# Patient Record
Sex: Male | Born: 1935 | Race: Black or African American | Hispanic: No | Smoking: Never smoker
Health system: Southern US, Community
[De-identification: ages and names within clinical notes are randomized; demographics above are authoritative.]

## PROBLEM LIST (undated history)

## (undated) DIAGNOSIS — M5136 Other intervertebral disc degeneration, lumbar region: Secondary | ICD-10-CM

## (undated) DIAGNOSIS — N289 Disorder of kidney and ureter, unspecified: Secondary | ICD-10-CM

## (undated) DIAGNOSIS — D494 Neoplasm of unspecified behavior of bladder: Secondary | ICD-10-CM

## (undated) DIAGNOSIS — D696 Thrombocytopenia, unspecified: Secondary | ICD-10-CM

## (undated) DIAGNOSIS — M51369 Other intervertebral disc degeneration, lumbar region without mention of lumbar back pain or lower extremity pain: Secondary | ICD-10-CM

## (undated) DIAGNOSIS — C61 Malignant neoplasm of prostate: Secondary | ICD-10-CM

## (undated) DIAGNOSIS — D61818 Other pancytopenia: Secondary | ICD-10-CM

## (undated) DIAGNOSIS — Z8719 Personal history of other diseases of the digestive system: Secondary | ICD-10-CM

## (undated) DIAGNOSIS — I459 Conduction disorder, unspecified: Secondary | ICD-10-CM

## (undated) DIAGNOSIS — E1142 Type 2 diabetes mellitus with diabetic polyneuropathy: Secondary | ICD-10-CM

## (undated) DIAGNOSIS — I77810 Thoracic aortic ectasia: Secondary | ICD-10-CM

## (undated) DIAGNOSIS — N183 Chronic kidney disease, stage 3 unspecified: Secondary | ICD-10-CM

## (undated) DIAGNOSIS — R2231 Localized swelling, mass and lump, right upper limb: Secondary | ICD-10-CM

## (undated) DIAGNOSIS — I10 Essential (primary) hypertension: Secondary | ICD-10-CM

## (undated) DIAGNOSIS — R55 Syncope and collapse: Secondary | ICD-10-CM

## (undated) DIAGNOSIS — I6523 Occlusion and stenosis of bilateral carotid arteries: Secondary | ICD-10-CM

## (undated) DIAGNOSIS — I5189 Other ill-defined heart diseases: Secondary | ICD-10-CM

## (undated) DIAGNOSIS — H9190 Unspecified hearing loss, unspecified ear: Secondary | ICD-10-CM

## (undated) DIAGNOSIS — I2510 Atherosclerotic heart disease of native coronary artery without angina pectoris: Secondary | ICD-10-CM

## (undated) DIAGNOSIS — I5022 Chronic systolic (congestive) heart failure: Secondary | ICD-10-CM

## (undated) DIAGNOSIS — R351 Nocturia: Secondary | ICD-10-CM

## (undated) HISTORY — PX: CORONARY ARTERY BYPASS GRAFT: SHX141

## (undated) HISTORY — PX: CATARACT EXTRACTION W/PHACO: SHX586

## (undated) HISTORY — PX: CARDIAC CATHETERIZATION: SHX172

## (undated) HISTORY — DX: Syncope and collapse: R55

## (undated) HISTORY — PX: UPPER GASTROINTESTINAL ENDOSCOPY: SHX188

## (undated) HISTORY — PX: LIPOMA EXCISION: SHX5283

## (undated) HISTORY — PX: CATARACT EXTRACTION W/ INTRAOCULAR LENS  IMPLANT, BILATERAL: SHX1307

---

## 1969-02-05 HISTORY — PX: LIPOMA EXCISION: SHX5283

## 1982-06-07 HISTORY — PX: CORONARY ARTERY BYPASS GRAFT: SHX141

## 1994-06-07 DIAGNOSIS — C61 Malignant neoplasm of prostate: Secondary | ICD-10-CM

## 1994-06-07 HISTORY — DX: Malignant neoplasm of prostate: C61

## 1999-01-15 ENCOUNTER — Ambulatory Visit (HOSPITAL_COMMUNITY): Admission: RE | Admit: 1999-01-15 | Discharge: 1999-01-15 | Payer: Self-pay | Admitting: Internal Medicine

## 1999-09-10 ENCOUNTER — Encounter: Payer: Self-pay | Admitting: Neurology

## 1999-09-10 ENCOUNTER — Encounter: Admission: RE | Admit: 1999-09-10 | Discharge: 1999-09-10 | Payer: Self-pay | Admitting: Neurology

## 2001-03-01 ENCOUNTER — Encounter: Admission: RE | Admit: 2001-03-01 | Discharge: 2001-03-30 | Payer: Self-pay | Admitting: Neurology

## 2001-09-21 ENCOUNTER — Encounter (HOSPITAL_BASED_OUTPATIENT_CLINIC_OR_DEPARTMENT_OTHER): Admission: RE | Admit: 2001-09-21 | Discharge: 2001-09-29 | Payer: Self-pay | Admitting: Internal Medicine

## 2001-12-25 ENCOUNTER — Encounter (HOSPITAL_BASED_OUTPATIENT_CLINIC_OR_DEPARTMENT_OTHER): Admission: RE | Admit: 2001-12-25 | Discharge: 2002-01-04 | Payer: Self-pay | Admitting: Internal Medicine

## 2002-04-16 ENCOUNTER — Encounter (HOSPITAL_BASED_OUTPATIENT_CLINIC_OR_DEPARTMENT_OTHER): Admission: RE | Admit: 2002-04-16 | Discharge: 2002-07-16 | Payer: Self-pay | Admitting: Internal Medicine

## 2002-07-19 ENCOUNTER — Encounter (HOSPITAL_BASED_OUTPATIENT_CLINIC_OR_DEPARTMENT_OTHER): Admission: RE | Admit: 2002-07-19 | Discharge: 2002-10-17 | Payer: Self-pay | Admitting: Internal Medicine

## 2002-12-12 ENCOUNTER — Encounter: Admission: RE | Admit: 2002-12-12 | Discharge: 2002-12-12 | Payer: Self-pay | Admitting: Neurology

## 2002-12-12 ENCOUNTER — Encounter: Payer: Self-pay | Admitting: Neurology

## 2002-12-12 ENCOUNTER — Encounter: Payer: Self-pay | Admitting: Diagnostic Radiology

## 2002-12-26 ENCOUNTER — Encounter: Payer: Self-pay | Admitting: Neurology

## 2002-12-26 ENCOUNTER — Encounter: Admission: RE | Admit: 2002-12-26 | Discharge: 2002-12-26 | Payer: Self-pay | Admitting: Neurology

## 2003-01-07 ENCOUNTER — Encounter: Admission: RE | Admit: 2003-01-07 | Discharge: 2003-01-07 | Payer: Self-pay | Admitting: Neurology

## 2003-01-07 ENCOUNTER — Encounter: Payer: Self-pay | Admitting: Neurology

## 2003-01-11 ENCOUNTER — Encounter (HOSPITAL_BASED_OUTPATIENT_CLINIC_OR_DEPARTMENT_OTHER): Admission: RE | Admit: 2003-01-11 | Discharge: 2003-04-11 | Payer: Self-pay | Admitting: Internal Medicine

## 2003-03-08 ENCOUNTER — Encounter: Payer: Self-pay | Admitting: Neurology

## 2003-03-08 ENCOUNTER — Ambulatory Visit (HOSPITAL_COMMUNITY): Admission: RE | Admit: 2003-03-08 | Discharge: 2003-03-08 | Payer: Self-pay | Admitting: Neurology

## 2003-03-23 ENCOUNTER — Encounter: Payer: Self-pay | Admitting: Emergency Medicine

## 2003-03-23 ENCOUNTER — Emergency Department (HOSPITAL_COMMUNITY): Admission: EM | Admit: 2003-03-23 | Discharge: 2003-03-23 | Payer: Self-pay | Admitting: Emergency Medicine

## 2003-05-09 ENCOUNTER — Encounter: Admission: RE | Admit: 2003-05-09 | Discharge: 2003-05-09 | Payer: Self-pay | Admitting: Neurology

## 2003-05-16 ENCOUNTER — Encounter (HOSPITAL_BASED_OUTPATIENT_CLINIC_OR_DEPARTMENT_OTHER): Admission: RE | Admit: 2003-05-16 | Discharge: 2003-05-28 | Payer: Self-pay | Admitting: Internal Medicine

## 2003-08-22 ENCOUNTER — Encounter (HOSPITAL_BASED_OUTPATIENT_CLINIC_OR_DEPARTMENT_OTHER): Admission: RE | Admit: 2003-08-22 | Discharge: 2003-08-29 | Payer: Self-pay | Admitting: Internal Medicine

## 2003-09-09 ENCOUNTER — Ambulatory Visit (HOSPITAL_COMMUNITY): Admission: RE | Admit: 2003-09-09 | Discharge: 2003-09-09 | Payer: Self-pay | Admitting: Gastroenterology

## 2003-11-22 ENCOUNTER — Ambulatory Visit (HOSPITAL_COMMUNITY): Admission: RE | Admit: 2003-11-22 | Discharge: 2003-11-22 | Payer: Self-pay | Admitting: Neurosurgery

## 2003-11-27 ENCOUNTER — Encounter (HOSPITAL_BASED_OUTPATIENT_CLINIC_OR_DEPARTMENT_OTHER): Admission: RE | Admit: 2003-11-27 | Discharge: 2003-12-13 | Payer: Self-pay | Admitting: Internal Medicine

## 2003-12-06 ENCOUNTER — Inpatient Hospital Stay (HOSPITAL_BASED_OUTPATIENT_CLINIC_OR_DEPARTMENT_OTHER): Admission: RE | Admit: 2003-12-06 | Discharge: 2003-12-06 | Payer: Self-pay | Admitting: Cardiology

## 2004-01-06 HISTORY — PX: LUMBAR FUSION: SHX111

## 2004-01-21 ENCOUNTER — Inpatient Hospital Stay (HOSPITAL_COMMUNITY): Admission: RE | Admit: 2004-01-21 | Discharge: 2004-01-27 | Payer: Self-pay | Admitting: Neurosurgery

## 2004-01-27 ENCOUNTER — Inpatient Hospital Stay (HOSPITAL_COMMUNITY)
Admission: RE | Admit: 2004-01-27 | Discharge: 2004-01-31 | Payer: Self-pay | Admitting: Physical Medicine & Rehabilitation

## 2004-03-20 ENCOUNTER — Encounter (HOSPITAL_BASED_OUTPATIENT_CLINIC_OR_DEPARTMENT_OTHER): Admission: RE | Admit: 2004-03-20 | Discharge: 2004-03-31 | Payer: Self-pay | Admitting: Internal Medicine

## 2004-04-17 ENCOUNTER — Emergency Department (HOSPITAL_COMMUNITY): Admission: EM | Admit: 2004-04-17 | Discharge: 2004-04-17 | Payer: Self-pay | Admitting: Emergency Medicine

## 2004-04-21 ENCOUNTER — Ambulatory Visit: Payer: Self-pay | Admitting: Internal Medicine

## 2004-06-30 ENCOUNTER — Encounter (HOSPITAL_BASED_OUTPATIENT_CLINIC_OR_DEPARTMENT_OTHER): Admission: RE | Admit: 2004-06-30 | Discharge: 2004-07-13 | Payer: Self-pay | Admitting: Internal Medicine

## 2004-07-13 ENCOUNTER — Ambulatory Visit: Payer: Self-pay | Admitting: Internal Medicine

## 2004-08-24 ENCOUNTER — Ambulatory Visit: Payer: Self-pay | Admitting: Internal Medicine

## 2004-08-25 ENCOUNTER — Ambulatory Visit: Payer: Self-pay | Admitting: Internal Medicine

## 2005-02-11 ENCOUNTER — Ambulatory Visit: Payer: Self-pay | Admitting: Internal Medicine

## 2005-03-11 ENCOUNTER — Ambulatory Visit: Payer: Self-pay | Admitting: Internal Medicine

## 2005-04-01 ENCOUNTER — Ambulatory Visit: Payer: Self-pay | Admitting: Internal Medicine

## 2005-04-02 ENCOUNTER — Ambulatory Visit: Payer: Self-pay | Admitting: Family Medicine

## 2005-05-06 ENCOUNTER — Ambulatory Visit: Payer: Self-pay | Admitting: Internal Medicine

## 2005-05-19 ENCOUNTER — Encounter: Admission: RE | Admit: 2005-05-19 | Discharge: 2005-06-06 | Payer: Self-pay | Admitting: Internal Medicine

## 2005-06-14 ENCOUNTER — Ambulatory Visit: Payer: Self-pay | Admitting: Internal Medicine

## 2005-06-16 ENCOUNTER — Encounter: Admission: RE | Admit: 2005-06-16 | Discharge: 2005-09-14 | Payer: Self-pay | Admitting: Internal Medicine

## 2005-08-03 ENCOUNTER — Ambulatory Visit: Payer: Self-pay | Admitting: Internal Medicine

## 2005-11-15 ENCOUNTER — Ambulatory Visit: Payer: Self-pay | Admitting: Internal Medicine

## 2005-11-24 ENCOUNTER — Ambulatory Visit: Payer: Self-pay | Admitting: Internal Medicine

## 2006-01-20 ENCOUNTER — Ambulatory Visit: Payer: Self-pay | Admitting: Internal Medicine

## 2006-03-15 ENCOUNTER — Ambulatory Visit: Payer: Self-pay | Admitting: Internal Medicine

## 2006-03-25 ENCOUNTER — Ambulatory Visit: Payer: Self-pay | Admitting: Internal Medicine

## 2006-04-21 ENCOUNTER — Ambulatory Visit: Payer: Self-pay | Admitting: Internal Medicine

## 2006-05-25 ENCOUNTER — Ambulatory Visit: Payer: Self-pay | Admitting: Internal Medicine

## 2006-05-25 LAB — CONVERTED CEMR LAB
BUN: 22 mg/dL (ref 6–23)
Creatinine, Ser: 1.7 mg/dL — ABNORMAL HIGH (ref 0.4–1.5)
Creatinine,U: 42.2 mg/dL
Hgb A1c MFr Bld: 7.8 % — ABNORMAL HIGH (ref 4.6–6.0)
Microalb Creat Ratio: 28.4 mg/g (ref 0.0–30.0)
Microalb, Ur: 1.2 mg/dL (ref 0.0–1.9)

## 2006-06-07 ENCOUNTER — Emergency Department (HOSPITAL_COMMUNITY): Admission: EM | Admit: 2006-06-07 | Discharge: 2006-06-07 | Payer: Self-pay | Admitting: Emergency Medicine

## 2006-06-21 ENCOUNTER — Ambulatory Visit: Payer: Self-pay | Admitting: Internal Medicine

## 2006-07-04 DIAGNOSIS — Q762 Congenital spondylolisthesis: Secondary | ICD-10-CM | POA: Insufficient documentation

## 2006-07-04 DIAGNOSIS — I2510 Atherosclerotic heart disease of native coronary artery without angina pectoris: Secondary | ICD-10-CM | POA: Insufficient documentation

## 2006-07-04 DIAGNOSIS — N179 Acute kidney failure, unspecified: Secondary | ICD-10-CM | POA: Insufficient documentation

## 2006-07-04 DIAGNOSIS — M810 Age-related osteoporosis without current pathological fracture: Secondary | ICD-10-CM | POA: Insufficient documentation

## 2006-07-22 ENCOUNTER — Ambulatory Visit: Payer: Self-pay | Admitting: Internal Medicine

## 2006-08-30 ENCOUNTER — Ambulatory Visit: Payer: Self-pay | Admitting: Internal Medicine

## 2006-08-30 LAB — CONVERTED CEMR LAB
BUN: 22 mg/dL (ref 6–23)
Creatinine, Ser: 1.5 mg/dL (ref 0.4–1.5)
Creatinine,U: 37.7 mg/dL
Hgb A1c MFr Bld: 8.1 % — ABNORMAL HIGH (ref 4.6–6.0)
Microalb Creat Ratio: 18.6 mg/g (ref 0.0–30.0)
Microalb, Ur: 0.7 mg/dL (ref 0.0–1.9)
Potassium: 3.8 meq/L (ref 3.5–5.1)

## 2006-09-06 ENCOUNTER — Ambulatory Visit: Payer: Self-pay | Admitting: Internal Medicine

## 2006-12-07 ENCOUNTER — Ambulatory Visit: Payer: Self-pay | Admitting: Internal Medicine

## 2006-12-12 LAB — CONVERTED CEMR LAB
ALT: 26 units/L (ref 0–53)
AST: 27 units/L (ref 0–37)
BUN: 23 mg/dL (ref 6–23)
Cholesterol: 122 mg/dL (ref 0–200)
Creatinine, Ser: 1.4 mg/dL (ref 0.4–1.5)
Creatinine,U: 44.7 mg/dL
HDL: 42.1 mg/dL (ref >39.0)
Hgb A1c MFr Bld: 8 % — ABNORMAL HIGH (ref 4.6–6.0)
LDL Cholesterol: 65 mg/dL (ref 0–99)
Microalb Creat Ratio: 17.9 mg/g (ref 0.0–30.0)
Microalb, Ur: 0.8 mg/dL (ref 0.0–1.9)
Potassium: 3.2 meq/L — ABNORMAL LOW (ref 3.5–5.1)
Total CHOL/HDL Ratio: 2.9
Triglycerides: 74 mg/dL (ref 0–149)
VLDL: 15 mg/dL (ref 0–40)

## 2006-12-13 ENCOUNTER — Encounter (INDEPENDENT_AMBULATORY_CARE_PROVIDER_SITE_OTHER): Payer: Self-pay | Admitting: *Deleted

## 2006-12-15 ENCOUNTER — Ambulatory Visit: Payer: Self-pay | Admitting: Internal Medicine

## 2006-12-15 DIAGNOSIS — B354 Tinea corporis: Secondary | ICD-10-CM | POA: Insufficient documentation

## 2006-12-15 DIAGNOSIS — R1011 Right upper quadrant pain: Secondary | ICD-10-CM | POA: Insufficient documentation

## 2006-12-18 LAB — CONVERTED CEMR LAB
ALT: 26 units/L (ref 0–53)
AST: 29 units/L (ref 0–37)
Albumin: 3.9 g/dL (ref 3.5–5.2)
Alkaline Phosphatase: 69 units/L (ref 39–117)
Basophils Absolute: 0 10*3/uL (ref 0.0–0.1)
Basophils Relative: 0.3 % (ref 0.0–1.0)
Bilirubin, Direct: 0.1 mg/dL (ref 0.0–0.3)
Eosinophils Absolute: 0.1 10*3/uL (ref 0.0–0.6)
Eosinophils Relative: 2.1 % (ref 0.0–5.0)
HCT: 43.2 % (ref 39.0–52.0)
Hemoglobin: 14.6 g/dL (ref 13.0–17.0)
Lymphocytes Relative: 47.5 % — ABNORMAL HIGH (ref 12.0–46.0)
MCHC: 33.8 g/dL (ref 30.0–36.0)
MCV: 87.4 fL (ref 78.0–100.0)
Monocytes Absolute: 0.5 10*3/uL (ref 0.2–0.7)
Monocytes Relative: 10.9 % (ref 3.0–11.0)
Neutro Abs: 1.8 10*3/uL (ref 1.4–7.7)
Neutrophils Relative %: 39.2 % — ABNORMAL LOW (ref 43.0–77.0)
Platelets: 126 10*3/uL — ABNORMAL LOW (ref 150–400)
RBC: 4.94 M/uL (ref 4.22–5.81)
RDW: 13.8 % (ref 11.5–14.6)
Total Bilirubin: 1 mg/dL (ref 0.3–1.2)
Total Protein: 7.2 g/dL (ref 6.0–8.3)
WBC: 4.6 10*3/uL (ref 4.5–10.5)

## 2006-12-19 ENCOUNTER — Encounter (INDEPENDENT_AMBULATORY_CARE_PROVIDER_SITE_OTHER): Payer: Self-pay | Admitting: *Deleted

## 2006-12-22 ENCOUNTER — Encounter: Admission: RE | Admit: 2006-12-22 | Discharge: 2006-12-22 | Payer: Self-pay | Admitting: Internal Medicine

## 2006-12-23 ENCOUNTER — Encounter (INDEPENDENT_AMBULATORY_CARE_PROVIDER_SITE_OTHER): Payer: Self-pay | Admitting: *Deleted

## 2006-12-27 ENCOUNTER — Telehealth (INDEPENDENT_AMBULATORY_CARE_PROVIDER_SITE_OTHER): Payer: Self-pay | Admitting: *Deleted

## 2006-12-29 ENCOUNTER — Ambulatory Visit: Payer: Self-pay | Admitting: Internal Medicine

## 2007-01-02 ENCOUNTER — Encounter (INDEPENDENT_AMBULATORY_CARE_PROVIDER_SITE_OTHER): Payer: Self-pay | Admitting: *Deleted

## 2007-04-10 ENCOUNTER — Telehealth (INDEPENDENT_AMBULATORY_CARE_PROVIDER_SITE_OTHER): Payer: Self-pay | Admitting: *Deleted

## 2007-07-10 ENCOUNTER — Telehealth (INDEPENDENT_AMBULATORY_CARE_PROVIDER_SITE_OTHER): Payer: Self-pay | Admitting: *Deleted

## 2007-07-17 ENCOUNTER — Telehealth (INDEPENDENT_AMBULATORY_CARE_PROVIDER_SITE_OTHER): Payer: Self-pay | Admitting: *Deleted

## 2007-07-24 ENCOUNTER — Telehealth (INDEPENDENT_AMBULATORY_CARE_PROVIDER_SITE_OTHER): Payer: Self-pay | Admitting: *Deleted

## 2007-07-26 ENCOUNTER — Ambulatory Visit: Payer: Self-pay | Admitting: Internal Medicine

## 2007-07-26 DIAGNOSIS — E119 Type 2 diabetes mellitus without complications: Secondary | ICD-10-CM | POA: Insufficient documentation

## 2007-07-26 DIAGNOSIS — I10 Essential (primary) hypertension: Secondary | ICD-10-CM | POA: Insufficient documentation

## 2007-07-26 DIAGNOSIS — R0989 Other specified symptoms and signs involving the circulatory and respiratory systems: Secondary | ICD-10-CM | POA: Insufficient documentation

## 2007-07-26 DIAGNOSIS — R51 Headache: Secondary | ICD-10-CM | POA: Insufficient documentation

## 2007-07-26 DIAGNOSIS — R519 Headache, unspecified: Secondary | ICD-10-CM | POA: Insufficient documentation

## 2007-07-27 ENCOUNTER — Encounter (INDEPENDENT_AMBULATORY_CARE_PROVIDER_SITE_OTHER): Payer: Self-pay | Admitting: *Deleted

## 2007-07-27 LAB — CONVERTED CEMR LAB: Sed Rate: 16 mm/hr (ref 0–20)

## 2007-08-01 ENCOUNTER — Emergency Department (HOSPITAL_COMMUNITY): Admission: EM | Admit: 2007-08-01 | Discharge: 2007-08-01 | Payer: Self-pay | Admitting: Family Medicine

## 2007-08-03 ENCOUNTER — Ambulatory Visit: Payer: Self-pay

## 2007-08-05 ENCOUNTER — Encounter: Payer: Self-pay | Admitting: Internal Medicine

## 2007-08-28 ENCOUNTER — Telehealth: Payer: Self-pay | Admitting: Internal Medicine

## 2007-08-28 ENCOUNTER — Encounter: Payer: Self-pay | Admitting: Internal Medicine

## 2007-08-28 ENCOUNTER — Emergency Department (HOSPITAL_COMMUNITY): Admission: EM | Admit: 2007-08-28 | Discharge: 2007-08-28 | Payer: Self-pay | Admitting: Emergency Medicine

## 2007-08-31 ENCOUNTER — Ambulatory Visit: Payer: Self-pay | Admitting: Internal Medicine

## 2007-08-31 DIAGNOSIS — R42 Dizziness and giddiness: Secondary | ICD-10-CM | POA: Insufficient documentation

## 2007-09-04 ENCOUNTER — Encounter: Payer: Self-pay | Admitting: Internal Medicine

## 2007-09-06 ENCOUNTER — Encounter: Payer: Self-pay | Admitting: Internal Medicine

## 2007-09-07 ENCOUNTER — Telehealth (INDEPENDENT_AMBULATORY_CARE_PROVIDER_SITE_OTHER): Payer: Self-pay | Admitting: *Deleted

## 2007-09-08 ENCOUNTER — Telehealth (INDEPENDENT_AMBULATORY_CARE_PROVIDER_SITE_OTHER): Payer: Self-pay | Admitting: *Deleted

## 2007-09-14 ENCOUNTER — Encounter (INDEPENDENT_AMBULATORY_CARE_PROVIDER_SITE_OTHER): Payer: Self-pay | Admitting: *Deleted

## 2007-09-25 ENCOUNTER — Telehealth (INDEPENDENT_AMBULATORY_CARE_PROVIDER_SITE_OTHER): Payer: Self-pay | Admitting: *Deleted

## 2007-10-06 ENCOUNTER — Encounter: Payer: Self-pay | Admitting: Internal Medicine

## 2007-10-31 ENCOUNTER — Telehealth (INDEPENDENT_AMBULATORY_CARE_PROVIDER_SITE_OTHER): Payer: Self-pay | Admitting: *Deleted

## 2008-01-08 ENCOUNTER — Telehealth (INDEPENDENT_AMBULATORY_CARE_PROVIDER_SITE_OTHER): Payer: Self-pay | Admitting: *Deleted

## 2008-01-09 ENCOUNTER — Telehealth (INDEPENDENT_AMBULATORY_CARE_PROVIDER_SITE_OTHER): Payer: Self-pay | Admitting: *Deleted

## 2008-01-29 ENCOUNTER — Encounter: Payer: Self-pay | Admitting: Internal Medicine

## 2008-02-06 ENCOUNTER — Encounter: Payer: Self-pay | Admitting: Internal Medicine

## 2008-02-28 ENCOUNTER — Ambulatory Visit: Payer: Self-pay | Admitting: Internal Medicine

## 2008-02-28 LAB — CONVERTED CEMR LAB
ALT: 23 units/L (ref 0–53)
AST: 26 units/L (ref 0–37)
Albumin: 3.7 g/dL (ref 3.5–5.2)
Alkaline Phosphatase: 58 units/L (ref 39–117)
BUN: 25 mg/dL — ABNORMAL HIGH (ref 6–23)
Basophils Absolute: 0 10*3/uL (ref 0.0–0.1)
Basophils Relative: 0.7 % (ref 0.0–3.0)
Bilirubin Urine: NEGATIVE
Bilirubin, Direct: 0.1 mg/dL (ref 0.0–0.3)
Blood in Urine, dipstick: NEGATIVE
CO2: 31 meq/L (ref 19–32)
Calcium: 8.9 mg/dL (ref 8.4–10.5)
Chloride: 106 meq/L (ref 96–112)
Cholesterol: 121 mg/dL (ref 0–200)
Creatinine, Ser: 1.5 mg/dL (ref 0.4–1.5)
Creatinine,U: 91.4 mg/dL
Eosinophils Absolute: 0.1 10*3/uL (ref 0.0–0.7)
Eosinophils Relative: 3.4 % (ref 0.0–5.0)
GFR calc Af Amer: 57 mL/min
GFR calc non Af Amer: 47 mL/min
Glucose, Bld: 120 mg/dL — ABNORMAL HIGH (ref 70–99)
Glucose, Urine, Semiquant: NEGATIVE
HCT: 45 % (ref 39.0–52.0)
HDL: 37.9 mg/dL — ABNORMAL LOW (ref >39.0)
Hemoglobin: 15.4 g/dL (ref 13.0–17.0)
Hgb A1c MFr Bld: 7.4 % — ABNORMAL HIGH (ref 4.6–6.0)
Ketones, urine, test strip: NEGATIVE
LDL Cholesterol: 68 mg/dL (ref 0–99)
Lymphocytes Relative: 45.6 % (ref 12.0–46.0)
MCHC: 34.3 g/dL (ref 30.0–36.0)
MCV: 88.3 fL (ref 78.0–100.0)
Microalb Creat Ratio: 21.9 mg/g (ref 0.0–30.0)
Microalb, Ur: 2 mg/dL — ABNORMAL HIGH (ref 0.0–1.9)
Monocytes Absolute: 0.4 10*3/uL (ref 0.1–1.0)
Monocytes Relative: 11.5 % (ref 3.0–12.0)
Neutro Abs: 1.5 10*3/uL (ref 1.4–7.7)
Neutrophils Relative %: 38.8 % — ABNORMAL LOW (ref 43.0–77.0)
Nitrite: NEGATIVE
PSA: 1.53 ng/mL (ref 0.10–4.00)
Platelets: 128 10*3/uL — ABNORMAL LOW (ref 150–400)
Potassium: 4.2 meq/L (ref 3.5–5.1)
Protein, U semiquant: NEGATIVE
RBC: 5.1 M/uL (ref 4.22–5.81)
RDW: 13.6 % (ref 11.5–14.6)
Sodium: 140 meq/L (ref 135–145)
Specific Gravity, Urine: <1.005
Total Bilirubin: 0.8 mg/dL (ref 0.3–1.2)
Total CHOL/HDL Ratio: 3.2
Total Protein: 7 g/dL (ref 6.0–8.3)
Triglycerides: 74 mg/dL (ref 0–149)
Urobilinogen, UA: 0.2
VLDL: 15 mg/dL (ref 0–40)
WBC Urine, dipstick: NEGATIVE
WBC: 3.8 10*3/uL — ABNORMAL LOW (ref 4.5–10.5)
pH: 6.5

## 2008-03-06 ENCOUNTER — Ambulatory Visit: Payer: Self-pay | Admitting: Internal Medicine

## 2008-03-06 DIAGNOSIS — Z8673 Personal history of transient ischemic attack (TIA), and cerebral infarction without residual deficits: Secondary | ICD-10-CM | POA: Insufficient documentation

## 2008-03-06 DIAGNOSIS — E785 Hyperlipidemia, unspecified: Secondary | ICD-10-CM | POA: Insufficient documentation

## 2008-03-06 DIAGNOSIS — E1165 Type 2 diabetes mellitus with hyperglycemia: Secondary | ICD-10-CM

## 2008-03-06 DIAGNOSIS — E1129 Type 2 diabetes mellitus with other diabetic kidney complication: Secondary | ICD-10-CM | POA: Insufficient documentation

## 2008-03-06 LAB — CONVERTED CEMR LAB
Cholesterol, target level: 200 mg/dL
HDL goal, serum: 40 mg/dL
LDL Goal: 70 mg/dL

## 2008-04-08 ENCOUNTER — Telehealth (INDEPENDENT_AMBULATORY_CARE_PROVIDER_SITE_OTHER): Payer: Self-pay | Admitting: *Deleted

## 2008-04-23 ENCOUNTER — Encounter: Payer: Self-pay | Admitting: Internal Medicine

## 2008-05-09 ENCOUNTER — Emergency Department (HOSPITAL_COMMUNITY): Admission: EM | Admit: 2008-05-09 | Discharge: 2008-05-09 | Payer: Self-pay | Admitting: Family Medicine

## 2008-05-16 ENCOUNTER — Ambulatory Visit: Payer: Self-pay | Admitting: Internal Medicine

## 2008-05-16 DIAGNOSIS — IMO0001 Reserved for inherently not codable concepts without codable children: Secondary | ICD-10-CM | POA: Insufficient documentation

## 2008-05-17 ENCOUNTER — Encounter: Payer: Self-pay | Admitting: Internal Medicine

## 2008-05-27 ENCOUNTER — Telehealth (INDEPENDENT_AMBULATORY_CARE_PROVIDER_SITE_OTHER): Payer: Self-pay | Admitting: *Deleted

## 2008-06-10 ENCOUNTER — Encounter: Payer: Self-pay | Admitting: Internal Medicine

## 2008-06-17 ENCOUNTER — Ambulatory Visit: Payer: Self-pay | Admitting: Internal Medicine

## 2008-07-15 ENCOUNTER — Encounter: Payer: Self-pay | Admitting: Internal Medicine

## 2008-08-07 ENCOUNTER — Ambulatory Visit: Payer: Self-pay

## 2008-08-07 ENCOUNTER — Encounter: Payer: Self-pay | Admitting: Internal Medicine

## 2008-08-16 ENCOUNTER — Ambulatory Visit: Payer: Self-pay | Admitting: Family Medicine

## 2008-08-16 DIAGNOSIS — K5289 Other specified noninfective gastroenteritis and colitis: Secondary | ICD-10-CM | POA: Insufficient documentation

## 2008-09-18 ENCOUNTER — Ambulatory Visit: Payer: Self-pay | Admitting: Internal Medicine

## 2008-09-28 ENCOUNTER — Encounter: Payer: Self-pay | Admitting: Internal Medicine

## 2008-10-01 ENCOUNTER — Ambulatory Visit: Payer: Self-pay | Admitting: Internal Medicine

## 2008-10-01 DIAGNOSIS — M5430 Sciatica, unspecified side: Secondary | ICD-10-CM | POA: Insufficient documentation

## 2008-10-04 ENCOUNTER — Telehealth: Payer: Self-pay | Admitting: Internal Medicine

## 2008-10-07 ENCOUNTER — Encounter: Payer: Self-pay | Admitting: Internal Medicine

## 2008-10-11 ENCOUNTER — Telehealth: Payer: Self-pay | Admitting: Internal Medicine

## 2008-10-14 ENCOUNTER — Ambulatory Visit: Payer: Self-pay | Admitting: Internal Medicine

## 2008-10-15 ENCOUNTER — Encounter: Admission: RE | Admit: 2008-10-15 | Discharge: 2008-11-28 | Payer: Self-pay | Admitting: Internal Medicine

## 2008-10-17 ENCOUNTER — Encounter: Payer: Self-pay | Admitting: Internal Medicine

## 2008-11-05 ENCOUNTER — Encounter: Payer: Self-pay | Admitting: Internal Medicine

## 2008-11-18 ENCOUNTER — Telehealth: Payer: Self-pay | Admitting: Internal Medicine

## 2008-12-02 ENCOUNTER — Encounter: Payer: Self-pay | Admitting: Internal Medicine

## 2008-12-02 ENCOUNTER — Telehealth (INDEPENDENT_AMBULATORY_CARE_PROVIDER_SITE_OTHER): Payer: Self-pay | Admitting: *Deleted

## 2008-12-20 ENCOUNTER — Telehealth (INDEPENDENT_AMBULATORY_CARE_PROVIDER_SITE_OTHER): Payer: Self-pay | Admitting: *Deleted

## 2009-03-11 ENCOUNTER — Ambulatory Visit: Payer: Self-pay | Admitting: Internal Medicine

## 2009-03-11 DIAGNOSIS — R238 Other skin changes: Secondary | ICD-10-CM | POA: Insufficient documentation

## 2009-03-12 ENCOUNTER — Encounter (INDEPENDENT_AMBULATORY_CARE_PROVIDER_SITE_OTHER): Payer: Self-pay | Admitting: *Deleted

## 2009-03-12 ENCOUNTER — Ambulatory Visit: Payer: Self-pay | Admitting: Internal Medicine

## 2009-03-13 LAB — CONVERTED CEMR LAB
BUN: 22 mg/dL (ref 6–23)
Basophils Relative: 1 % (ref 0.0–3.0)
Creatinine, Ser: 1.5 mg/dL (ref 0.4–1.5)
Eosinophils Relative: 1 % (ref 0.0–5.0)
HCT: 45.9 % (ref 39.0–52.0)
Hemoglobin: 15.3 g/dL (ref 13.0–17.0)
Lymphocytes Relative: 68 % — ABNORMAL HIGH (ref 12.0–46.0)
MCHC: 33.3 g/dL (ref 30.0–36.0)
MCV: 89.6 fL (ref 78.0–100.0)
Monocytes Relative: 10 % (ref 3.0–12.0)
Neutrophils Relative %: 20 % — ABNORMAL LOW (ref 43.0–77.0)
Platelets: 119 10*3/uL — ABNORMAL LOW (ref 150.0–400.0)
Potassium: 4.1 meq/L (ref 3.5–5.1)
RBC: 5.12 M/uL (ref 4.22–5.81)
RDW: 13.2 % (ref 11.5–14.6)
WBC: 3.9 10*3/uL — ABNORMAL LOW (ref 4.5–10.5)

## 2009-04-07 ENCOUNTER — Encounter: Payer: Self-pay | Admitting: Internal Medicine

## 2009-05-20 ENCOUNTER — Telehealth (INDEPENDENT_AMBULATORY_CARE_PROVIDER_SITE_OTHER): Payer: Self-pay | Admitting: *Deleted

## 2009-07-07 ENCOUNTER — Telehealth (INDEPENDENT_AMBULATORY_CARE_PROVIDER_SITE_OTHER): Payer: Self-pay | Admitting: *Deleted

## 2009-07-10 ENCOUNTER — Ambulatory Visit: Payer: Self-pay | Admitting: Internal Medicine

## 2009-07-17 ENCOUNTER — Ambulatory Visit: Payer: Self-pay | Admitting: Internal Medicine

## 2009-07-17 ENCOUNTER — Observation Stay (HOSPITAL_COMMUNITY): Admission: EM | Admit: 2009-07-17 | Discharge: 2009-07-18 | Payer: Self-pay | Admitting: Emergency Medicine

## 2009-08-12 ENCOUNTER — Ambulatory Visit: Payer: Self-pay | Admitting: Internal Medicine

## 2009-08-12 DIAGNOSIS — E162 Hypoglycemia, unspecified: Secondary | ICD-10-CM | POA: Insufficient documentation

## 2009-08-20 ENCOUNTER — Encounter: Payer: Self-pay | Admitting: Internal Medicine

## 2009-08-20 ENCOUNTER — Ambulatory Visit: Payer: Self-pay

## 2009-08-20 DIAGNOSIS — I6523 Occlusion and stenosis of bilateral carotid arteries: Secondary | ICD-10-CM

## 2009-08-20 HISTORY — DX: Occlusion and stenosis of bilateral carotid arteries: I65.23

## 2009-08-23 ENCOUNTER — Emergency Department (HOSPITAL_COMMUNITY): Admission: EM | Admit: 2009-08-23 | Discharge: 2009-08-23 | Payer: Self-pay | Admitting: Family Medicine

## 2009-08-25 ENCOUNTER — Encounter: Payer: Self-pay | Admitting: Internal Medicine

## 2010-01-27 ENCOUNTER — Telehealth (INDEPENDENT_AMBULATORY_CARE_PROVIDER_SITE_OTHER): Payer: Self-pay | Admitting: *Deleted

## 2010-02-03 ENCOUNTER — Ambulatory Visit: Payer: Self-pay | Admitting: Internal Medicine

## 2010-02-03 LAB — CONVERTED CEMR LAB
ALT: 16 units/L (ref 0–53)
AST: 22 units/L (ref 0–37)
Albumin: 3.8 g/dL (ref 3.5–5.2)
Alkaline Phosphatase: 70 units/L (ref 39–117)
BUN: 21 mg/dL (ref 6–23)
Bilirubin, Direct: 0.1 mg/dL (ref 0.0–0.3)
Cholesterol: 152 mg/dL (ref 0–200)
Creatinine, Ser: 1.4 mg/dL (ref 0.4–1.5)
HDL: 39.3 mg/dL (ref >39.00)
LDL Cholesterol: 89 mg/dL (ref 0–99)
Potassium: 3.8 meq/L (ref 3.5–5.1)
Total Bilirubin: 0.7 mg/dL (ref 0.3–1.2)
Total CHOL/HDL Ratio: 4
Total Protein: 6.7 g/dL (ref 6.0–8.3)
Triglycerides: 119 mg/dL (ref 0.0–149.0)
VLDL: 23.8 mg/dL (ref 0.0–40.0)

## 2010-02-11 ENCOUNTER — Ambulatory Visit: Payer: Self-pay | Admitting: Internal Medicine

## 2010-02-23 ENCOUNTER — Encounter: Payer: Self-pay | Admitting: Internal Medicine

## 2010-02-26 ENCOUNTER — Encounter: Payer: Self-pay | Admitting: Internal Medicine

## 2010-03-11 ENCOUNTER — Ambulatory Visit: Payer: Self-pay | Admitting: Internal Medicine

## 2010-06-23 ENCOUNTER — Ambulatory Visit
Admission: RE | Admit: 2010-06-23 | Discharge: 2010-06-23 | Payer: Self-pay | Source: Home / Self Care | Attending: Internal Medicine | Admitting: Internal Medicine

## 2010-06-23 DIAGNOSIS — M25559 Pain in unspecified hip: Secondary | ICD-10-CM | POA: Insufficient documentation

## 2010-06-28 ENCOUNTER — Encounter: Payer: Self-pay | Admitting: Neurosurgery

## 2010-07-01 ENCOUNTER — Encounter: Payer: Self-pay | Admitting: Internal Medicine

## 2010-07-07 ENCOUNTER — Ambulatory Visit
Admission: RE | Admit: 2010-07-07 | Discharge: 2010-07-07 | Payer: Self-pay | Source: Home / Self Care | Attending: Internal Medicine | Admitting: Internal Medicine

## 2010-07-07 DIAGNOSIS — M5440 Lumbago with sciatica, unspecified side: Secondary | ICD-10-CM | POA: Insufficient documentation

## 2010-07-07 NOTE — Assessment & Plan Note (Signed)
Summary: B/P folow-up/scm   Vital Signs:  Patient profile:   77 year old male Height:      65.25 inches Weight:      160 pounds Pulse rate:   64 / minute Resp:     14 per minute BP sitting:   130 / 62  (left arm)  Vitals Entered By: Malachi Bonds (July 10, 2009 2:57 PM) CC: BP f/u- pt brought list of meds that insurance will cover , Hypertension Management   CC:  BP f/u- pt brought list of meds that insurance will cover  and Hypertension Management.  Hypertension History:      He denies headache, chest pain, palpitations, dyspnea with exertion, orthopnea, PND, peripheral edema, visual symptoms, neurologic problems, and syncope.  BP not checked @ home.        Positive major cardiovascular risk factors include male age 83 years old or older, diabetes, hyperlipidemia, and hypertension.  Negative major cardiovascular risk factors include negative family history for ischemic heart disease and non-tobacco-user status.        Positive history for target organ damage include ASHD (either angina/prior MI/prior CABG), prior stroke (or TIA), and peripheral vascular disease.     Allergies: 1)  ! Codeine 2)  ! Ace Inhibitors 3)  ! * Metformin  Review of Systems Eyes:  Denies blurring, double vision, and vision loss-both eyes. CV:  Denies leg cramps with exertion. Neuro:  Denies brief paralysis, numbness, tingling, and weakness.  Physical Exam  General:  Appears younger tahn age,in no acute distress; alert,appropriate and cooperative throughout examination Lungs:  Normal respiratory effort, chest expands symmetrically. Lungs are clear to auscultation, no crackles or wheezes. Heart:  normal rate, regular rhythm, no gallop, no rub, no JVD, and grade 1 /6 systolic murmur.   Pulses:  R and L carotid,radial  pulses are full and equal bilaterally. Decreased pedal pulses w/o ischemia. L carotid bruit Extremities:  trace left pedal edema and trace right pedal edema. Good nail health     Neurologic:  alert & oriented X3 and sensation intact to light touch over feet.   Skin:  Intact without suspicious lesions or rashes   Impression & Recommendations:  Problem # 1:  UNSPECIFIED ESSENTIAL HYPERTENSION (ICD-401.9) Excellent control His updated medication list for this problem includes:    Norvasc 10 Mg Tabs (Amlodipine besylate) .Marland Kitchen... Take one tablet daily    Toprol Xl 25 Mg Xr24h-tab (Metoprolol succinate) .Marland Kitchen... 1qd    Metoprolol Succinate 25 Mg Xr24h-tab (Metoprolol succinate) .Marland Kitchen... 1 once daily if bp averages > 130/80    Losartan Potassium-hctz 100-12.5 Mg Tabs (Losartan potassium-hctz) .Marland Kitchen... 1 qd  Complete Medication List: 1)  Bd U/f Iii Short Pen Needle 31g X 8 Mm Misc (Insulin pen needle) 2)  Norvasc 10 Mg Tabs (Amlodipine besylate) .... Take one tablet daily 3)  Gabapentin 300 Mg Caps (Gabapentin) .Marland Kitchen.. 1 by mouth tid 4)  Lantus 100 Unit/ml Soln (Insulin glargine) .Marland Kitchen.. 15 units qam and 10 units supper 5)  Fish Oil 1000 Mg Caps (Omega-3 fatty acids) .... Take 2 capsule by mouth twice a day 6)  Glucagon Emergency 1 Mg Kit (Glucagon (rdna)) .... Administer 1 mg subcutaneously 7)  Fosamax 70 Mg Tabs (Alendronate sodium) .Marland Kitchen.. 1 by mouth qweek 8)  Multivitamin  9)  Bd Insulin Syringe Ultrafine 30g X 1/2" 0.5 Ml Misc (Insulin syringe-needle u-100) .... Pt test 1x a day 10)  Precision Xtra Blood Glucose Strp (Glucose blood) .... Uses four times daily 11)  Humalog Pen 100 Unit/ml Soln (Insulin lispro (human)) .... 4 units q supper 12)  Januvia 100 Mg Tabs (Sitagliptin phosphate) .Marland Kitchen.. 1 by mouth qd 13)  Nitroquick 0.4  .... Use as directed with chest pain as needed 14)  Lumigan 0.03 % Soln (Bimatoprost) .Marland Kitchen.. 1 drop in each eye daily 15)  Toprol Xl 25 Mg Xr24h-tab (Metoprolol succinate) .Marland Kitchen.. 1qd 16)  Aggrenox 25-200 Mg Xr12h-cap (Aspirin-dipyridamole) .Marland Kitchen.. 1 by mouth two times a day 17)  Cyclobenzaprine Hcl 5 Mg Tabs (Cyclobenzaprine hcl) .Marland Kitchen.. 1 at bedtime as needed 18)   Metoprolol Succinate 25 Mg Xr24h-tab (Metoprolol succinate) .Marland Kitchen.. 1 once daily if bp averages > 130/80 19)  Darvocet A500 100-500 Mg Tabs (Propoxyphene n-apap) .Marland Kitchen.. 1-2 q 6 hrs as needed pain 20)  Losartan Potassium-hctz 100-12.5 Mg Tabs (Losartan potassium-hctz) .Marland Kitchen.. 1 qd 21)  Simvastatin 40 Mg Tabs (Simvastatin) .Marland Kitchen.. 1 at bedtime  Hypertension Assessment/Plan:      The patient's hypertensive risk group is category C: Target organ damage and/or diabetes.  Today's blood pressure is 130/62.    Patient Instructions: 1)  Please schedule a follow-up appointment in 3 months. 2)  Check your Blood Pressure regularly. If it is above:140/90 ON AVERAGE  you should make an appointment. 3)  BUN,creat,K+ prior to visit, ICD-9:401.9 4)  Hepatic Panel prior to visit, ICD-9:995.20 5)  Lipid Panel prior to visit, ICD-9:272.4 Prescriptions: LOSARTAN POTASSIUM-HCTZ 100-12.5 MG TABS (LOSARTAN POTASSIUM-HCTZ) 1 qd  #90 x 3   Entered and Authorized by:   Unice Cobble MD   Signed by:   Unice Cobble MD on 07/10/2009   Method used:   Print then Give to Patient   RxID:   [id number]

## 2010-07-07 NOTE — Letter (Signed)
Summary: [facility]  [facility]   Imported By: Edmonia James 09/19/2009 08:47:20  _____________________________________________________________________  External Attachment:    Type:   Image     Comment:   External Document

## 2010-07-07 NOTE — Assessment & Plan Note (Signed)
Summary: DOT PAPERWORK--NEEDS APPT///SPH   Vital Signs:  Patient profile:   77 year old male Height:      65.25 inches Weight:      153 pounds Temp:     97.9 degrees F oral Pulse rate:   72 / minute Resp:     18 per minute BP sitting:   150 / 78  (left arm)  Vitals Entered By: Rolla Flatten CMA (March 11, 2010 1:10 PM) CC: complete DOT paperwork, Type 2 diabetes mellitus follow-up   CC:  complete DOT paperwork and Type 2 diabetes mellitus follow-up.  History of Present Illness:     Hypertension Follow-Up as required by [facility]:   The patient denies lightheadedness, urinary frequency, headaches, and fatigue.  The patient denies the following associated symptoms: chest pain, chest pressure, exercise intolerance, dyspnea, palpitations, syncope, leg edema, and pedal edema.  Compliance with medications (by patient report) has been near 100%.  The patient reports that dietary compliance has been good.  The patient reports exercising daily.  Adjunctive measures currently used by the patient include salt restriction.  BP  not checked @ home . Creatinine was 1.4  02/03/2010.     Hyperlipidemia Follow-Up: The patient denies muscle aches, GI upset, abdominal pain, flushing, itching, constipation, and diarrhea.  Compliance with medications (by patient report) has been near 100%.  Adjunctive measures currently used by the patient include ASA and fish oil supplements.Lipids were @ goal 02/03/2010.  Carotid Dopplers revealed < 39% stenosis in 08/2009. Type 2 Diabetes Mellitus Follow-Up      The patient is  followed by Dr Wilson Singer  for Type 2 diabetes mellitus ; he was last seen 03/05/2010. That report is not available.  The patient reports self managed hypoglycemia.  The patient has been measuring capillary blood glucose before breakfast ( 109-202), before lunch ( 185-269), before dinner( 215-285), and  2 hrs after  dinner (< 315).    Current Medications (verified): 1)  Gabapentin 300 Mg Caps (Gabapentin)  .Marland Kitchen.. 1 By Mouth Tid 2)  Lantus 100 Unit/ml Soln (Insulin Glargine) .Marland Kitchen.. 10 Units Qam and 5 Units Supper 3)  Fish Oil 1000 Mg Caps (Omega-3 Fatty Acids) .... Take 2 Capsule By Mouth Twice A Day 4)  Glucagon Emergency 1 Mg Kit (Glucagon (Rdna)) .... Administer 1 Mg Subcutaneously 5)  Bd Insulin Syringe Ultrafine 30g X 1/2" 0.5 Ml  Misc (Insulin Syringe-Needle U-100) .... Pt Test 1x A Day 6)  Precision Xtra Blood Glucose   Strp (Glucose Blood) .... Uses Four Times Daily 7)  Humalog Pen 100 Unit/ml  Soln (Insulin Lispro (Human)) .... 4 Units Q Supper 8)  Januvia 100 Mg  Tabs (Sitagliptin Phosphate) .Marland Kitchen.. 1 By Mouth Qd 9)  Nitroquick 0.4 .... Use As Directed With Chest Pain As Needed 10)  Lumigan 0.03 % Soln (Bimatoprost) .Marland Kitchen.. 1 Drop in Each Eye Daily 11)  Losartan Potassium-Hctz 100-12.5 Mg Tabs (Losartan Potassium-Hctz) .Marland Kitchen.. 1 Qd 12)  Simvastatin 40 Mg Tabs (Simvastatin) .Marland Kitchen.. 1 At Bedtime 13)  Aspirin 81 Mg Tbec (Aspirin) .... Take One Tablet Daily 14)  Centrum Silver  Chew (Multiple Vitamins-Minerals) .... Take One Tablet Daily 15)  Carvedilol 6.25 Mg Tabs (Carvedilol) .Marland Kitchen.. 1 Two Times A Day (Replaces Amlodipine & Metoprolol)  Allergies (verified): 1)  ! Codeine 2)  ! Ace Inhibitors 3)  ! * Metformin  Past History:  Past Surgical History: coronary artery disease S/P  non Q wave MI CABG 1984 CABG TIMES THREE 1997 PROSTATE CANCER TOE  SURGERY UPPER ENDOSCOPY DIAGNOSE HIATAL HERNIA PROSTATE IRRADIATION 1996 LUMBAR FUSION 01/2004, Dr Vertell Limber  Physical Exam  General:  Appears younger than age,well-nourished,in no acute distress; alert,appropriate and cooperative throughout examination Lungs:  Normal respiratory effort, chest expands symmetrically. Lungs are clear to auscultation, no crackles or wheezes. Heart:  normal rate, regular rhythm, no gallop, no rub, no JVD, no HJR, and grade 1 /6 systolic murmur.  BP rechecked : flow heard @ [id number] systolic but definitive beat heard @ 115( 115/70  bilaterally)  Abdomen:  Bowel sounds positive,abdomen soft and non-tender without masses, organomegaly or hernias noted. Pulses:  R and L carotid,radial,dorsalis pedis and posterior tibial pulses are full and equal bilaterally. R carotid bruit Extremities:  No clubbing, cyanosis, edema. Skin:  Intact without suspicious lesions or rashes Psych:  memory intact for recent and remote, normally interactive, good eye contact, not anxious appearing, and not depressed appearing.     Impression & Recommendations:  Problem # 1:  OTHER AND UNSPECIFIED HYPERLIPIDEMIA (ICD-272.4) Lipids are @ goal His updated medication list for this problem includes:    Simvastatin 40 Mg Tabs (Simvastatin) .Marland Kitchen... 1 at bedtime  Problem # 2:  UNSPECIFIED ESSENTIAL HYPERTENSION (ICD-401.9) Rigidity of arteries is artificially raising BP; control adequate His updated medication list for this problem includes:    Losartan Potassium-hctz 100-12.5 Mg Tabs (Losartan potassium-hctz) .Marland Kitchen... 1 qd    Carvedilol 6.25 Mg Tabs (Carvedilol) .Marland Kitchen... 1 two times a day (replaces amlodipine & metoprolol)  Problem # 3:  AODM (ICD-250.00) Dr Wilson Singer to provide statement as to  impact , if any ,of   Diabetes  on driving privileges His updated medication list for this problem includes:    Lantus 100 Unit/ml Soln (Insulin glargine) .Marland KitchenMarland KitchenMarland KitchenMarland Kitchen 10 units qam and 5 units supper    Glucagon Emergency 1 Mg Kit (Glucagon (rdna)) .Marland Kitchen... Administer 1 mg subcutaneously    Humalog Pen 100 Unit/ml Soln (Insulin lispro (human)) .Marland KitchenMarland KitchenMarland KitchenMarland Kitchen 4 units q supper    Januvia 100 Mg Tabs (Sitagliptin phosphate) .Marland Kitchen... 1 by mouth qd    Losartan Potassium-hctz 100-12.5 Mg Tabs (Losartan potassium-hctz) .Marland Kitchen... 1 qd    Aspirin 81 Mg Tbec (Aspirin) .Marland Kitchen... Take one tablet daily  Complete Medication List: 1)  Gabapentin 300 Mg Caps (Gabapentin) .Marland Kitchen.. 1 by mouth tid 2)  Lantus 100 Unit/ml Soln (Insulin glargine) .Marland Kitchen.. 10 units qam and 5 units supper 3)  Fish Oil 1000 Mg Caps (Omega-3  fatty acids) .... Take 2 capsule by mouth twice a day 4)  Glucagon Emergency 1 Mg Kit (Glucagon (rdna)) .... Administer 1 mg subcutaneously 5)  Bd Insulin Syringe Ultrafine 30g X 1/2" 0.5 Ml Misc (Insulin syringe-needle u-100) .... Pt test 1x a day 6)  Precision Xtra Blood Glucose Strp (Glucose blood) .... Uses four times daily 7)  Humalog Pen 100 Unit/ml Soln (Insulin lispro (human)) .... 4 units q supper 8)  Januvia 100 Mg Tabs (Sitagliptin phosphate) .Marland Kitchen.. 1 by mouth qd 9)  Nitroquick 0.4  .... Use as directed with chest pain as needed 10)  Lumigan 0.03 % Soln (Bimatoprost) .Marland Kitchen.. 1 drop in each eye daily 11)  Losartan Potassium-hctz 100-12.5 Mg Tabs (Losartan potassium-hctz) .Marland Kitchen.. 1 qd 12)  Simvastatin 40 Mg Tabs (Simvastatin) .Marland Kitchen.. 1 at bedtime 13)  Aspirin 81 Mg Tbec (Aspirin) .... Take one tablet daily 14)  Centrum Silver Chew (Multiple vitamins-minerals) .... Take one tablet daily 15)  Carvedilol 6.25 Mg Tabs (Carvedilol) .Marland Kitchen.. 1 two times a day (replaces amlodipine & metoprolol)  Patient Instructions:  1)  Please obtain statement from 03/05/2010 appt from Dr Wilson Singer as to Diabetes impact on driving

## 2010-07-07 NOTE — Progress Notes (Signed)
Summary: REFILL REQUEST  Phone Note Refill Request Call back at Home Phone [phone] Message from:  Pharmacy on January 27, 2010 8:37 AM  Refills Requested: Medication #1:  SIMVASTATIN 40 MG TABS 1 at bedtime   Dosage confirmed as above?Dosage Confirmed   Supply Requested: 3 months  Medication #2:  METOPROLOL SUCCINATE 25 MG XR24H-TAB 1 once daily if BP averages > 130/80   Dosage confirmed as above?Dosage Confirmed   Supply Requested: 3 months PT AWARE READY FOR PICKUP WITHIN 24 HRS. [phone]   Method Requested: Pick up at Office Next Appointment Scheduled: NONE Initial call taken by: Osborn Coho,  January 27, 2010 8:38 AM  Follow-up for Phone Call        Patient due for Lipid/Hep 272.4/995.20  left message on VM informing patient rx's ready for pick-up Follow-up by: Georgette Dover CMA,  January 28, 2010 3:55 PM    New/Updated Medications: SIMVASTATIN 40 MG TABS (SIMVASTATIN) 1 at bedtime** Labs Due** Prescriptions: METOPROLOL SUCCINATE 25 MG XR24H-TAB (METOPROLOL SUCCINATE) 1 once daily if BP averages > 130/80  #90 x 1   Entered by:   [place] by:   Unice Cobble MD   Signed by:   Georgette Dover CMA on 01/28/2010   Method used:   Print then Give to Patient   RxID:   [id number] SIMVASTATIN 40 MG TABS (SIMVASTATIN) 1 at bedtime** Labs Due**  #90 x 0   Entered by:   Georgette Dover CMA   Authorized by:   Unice Cobble MD   Signed by:   Georgette Dover CMA on 01/28/2010   Method used:   Print then Give to Patient   RxID:   [id number]

## 2010-07-07 NOTE — Assessment & Plan Note (Signed)
Summary: post hospital f/u//lch   Vital Signs:  Patient profile:   77 year old male Weight:      158 pounds Pulse rate:   68 / minute Resp:     17 per minute BP sitting:   138 / 70  (left arm)  Vitals Entered By: Malachi Bonds (August 12, 2009 2:12 PM) CC: hosp f/u - also needs refill on fosamax,gabapentin, and simvastatin   CC:  hosp f/u - also needs refill on fosamax, gabapentin, and and simvastatin.  History of Present Illness: D/C Summary reviewed;hypoglycemia to 43 in context of am Lantus & delayed lunch. He has been  asymptomatic since D/C. FBS average in 90s; he takes 15 Lantus in am & 10 u Lantus & 4 u Humalog 2 night as per Dr Wilson Singer in 04/2009. FBS were in 100-110 then. He has SSI @ lunch.  Allergies: 1)  ! Codeine 2)  ! Ace Inhibitors 3)  ! * Metformin  Review of Systems Eyes:  Denies blurring, double vision, and vision loss-both eyes. CV:  Denies chest pain or discomfort, lightheadness, near fainting, and palpitations. Neuro:  Complains of poor balance; denies brief paralysis, disturbances in coordination, falling down, numbness, tingling, and weakness; Slight balance issues @ times.  Physical Exam  General:  Appears younger than age,well-nourished,in no acute distress; alert,appropriate and cooperative throughout examination Heart:  normal rate, regular rhythm, no gallop, no rub, no JVD, and grade [id number] /6 systolic murmur. Carotid radiation vs bruit , L >R  Extremities:  Amputations LUE Neurologic:  alert & oriented X3, strength normal in all extremities, gait normal, finger-to-nose normal, and Romberg  slightly unsteady. No pronator drift Skin:  Intact without suspicious lesions or rashes Psych:  memory intact for recent and remote, normally interactive, and good eye contact.     Impression & Recommendations:  Problem # 1:  HYPOGLYCEMIA, UNSPECIFIED (ICD-251.2)  related to delayed meal  Orders: Endocrinology Referral (Endocrine)  Problem # 2:  OTHER AND  UNSPECIFIED HYPERLIPIDEMIA (ICD-272.4)  His updated medication list for this problem includes:    Simvastatin 40 Mg Tabs (Simvastatin) .Marland Kitchen... 1 at bedtime  Complete Medication List: 1)  Norvasc 10 Mg Tabs (Amlodipine besylate) .... Take one tablet daily 2)  Gabapentin 300 Mg Caps (Gabapentin) .Marland Kitchen.. 1 by mouth tid 3)  Lantus 100 Unit/ml Soln (Insulin glargine) .Marland Kitchen.. 15 units qam and 10 units supper 4)  Fish Oil 1000 Mg Caps (Omega-3 fatty acids) .... Take 2 capsule by mouth twice a day 5)  Glucagon Emergency 1 Mg Kit (Glucagon (rdna)) .... Administer 1 mg subcutaneously 6)  Fosamax 70 Mg Tabs (Alendronate sodium) .Marland Kitchen.. 1 by mouth qweek 7)  Bd Insulin Syringe Ultrafine 30g X 1/2" 0.5 Ml Misc (Insulin syringe-needle u-100) .... Pt test 1x a day 8)  Precision Xtra Blood Glucose Strp (Glucose blood) .... Uses four times daily 9)  Humalog Pen 100 Unit/ml Soln (Insulin lispro (human)) .... 4 units q supper 10)  Januvia 100 Mg Tabs (Sitagliptin phosphate) .Marland Kitchen.. 1 by mouth qd 11)  Nitroquick 0.4  .... Use as directed with chest pain as needed 12)  Lumigan 0.03 % Soln (Bimatoprost) .Marland Kitchen.. 1 drop in each eye daily 13)  Cyclobenzaprine Hcl 5 Mg Tabs (Cyclobenzaprine hcl) .Marland Kitchen.. 1 at bedtime as needed 14)  Metoprolol Succinate 25 Mg Xr24h-tab (Metoprolol succinate) .Marland Kitchen.. 1 once daily if bp averages > 130/80 15)  Losartan Potassium-hctz 100-12.5 Mg Tabs (Losartan potassium-hctz) .Marland Kitchen.. 1 qd 16)  Simvastatin 40 Mg Tabs (Simvastatin) .Marland KitchenMarland KitchenMarland Kitchen  1 at bedtime 17)  Aspirin 81 Mg Tbec (Aspirin) .... Take one tablet daily 18)  Centrum Silver Chew (Multiple vitamins-minerals) .... Take one tablet daily  Patient Instructions: 1)  Check your blood sugars regularly. If your readings are usually above :160  or below 100 you should  contact physician.Take the D/C Summary to Dr Wilson Singer. Fosamax should be continued X 5 years MAX. Prescriptions: SIMVASTATIN 40 MG TABS (SIMVASTATIN) 1 at bedtime  #90 x 1   Entered and Authorized by:    Unice Cobble MD   Signed by:   Unice Cobble MD on 08/12/2009   Method used:   Print then Give to Patient   RxID:   [id number] GABAPENTIN 300 MG CAPS (GABAPENTIN) 1 by mouth tid  #270 x 3   Entered and Authorized by:   Unice Cobble MD   Signed by:   Unice Cobble MD on 08/12/2009   Method used:   Print then Give to Patient   RxID:   [id number]

## 2010-07-07 NOTE — Assessment & Plan Note (Signed)
Summary: review lab - cbs   Vital Signs:  Patient profile:   77 year old male Weight:      153.6 pounds Temp:     98.2 degrees F oral Pulse rate:   60 / minute Resp:     15 per minute BP sitting:   150 / 80  (left arm) Cuff size:   large  Vitals Entered By: Georgette Dover CMA (February 11, 2010 3:05 PM) CC: Follow-up visit: discuss labs (copy given), New RX for Nitroquick, and discuss meds (amlodipine,metoprolol, & simvastatin)   CC:  Follow-up visit: discuss labs (copy given), New RX for Nitroquick, and discuss meds (amlodipine, metoprolol, and & simvastatin).  History of Present Illness: Hyperlipidemia Follow-Up      This is an 77 year old man who presents for Hyperlipidemia follow-up.  The patient denies muscle aches, GI upset, abdominal pain, flushing, itching, constipation, diarrhea, and fatigue.  The patient denies the following symptoms: chest pain/pressure, exercise intolerance, dypsnea, palpitations, syncope, and pedal edema.  Compliance with medications (by patient report) has been near 100%.  Dietary compliance has been good.  The patient reports exercising daily as walking 30 min.  Adjunctive measures currently used by the patient include ASA and fish oil supplements.  Lipids reviewed ; all @ goal on Simvastatin 40 mg at bedtime.  Hypertension Follow-Up      The patient also presents for Hypertension follow-up.  The patient denies lightheadedness, urinary frequency (but having nocturia X2 ), and headaches.  Compliance with medications (by patient report) has been near 100%.  Adjunctive measures currently used by the patient include salt restriction.  BP not monitored. He is on Amlodipine 10 mg ; there is potential interactionwith this CCB & the statin.BP today elevated.  Current Medications (verified): 1)  Norvasc 10 Mg Tabs (Amlodipine Besylate) .... Take One Tablet Daily 2)  Gabapentin 300 Mg Caps (Gabapentin) .Marland Kitchen.. 1 By Mouth Tid 3)  Lantus 100 Unit/ml Soln (Insulin Glargine)  .Marland Kitchen.. 10 Units Qam and 5 Units Supper 4)  Fish Oil 1000 Mg Caps (Omega-3 Fatty Acids) .... Take 2 Capsule By Mouth Twice A Day 5)  Glucagon Emergency 1 Mg Kit (Glucagon (Rdna)) .... Administer 1 Mg Subcutaneously 6)  Bd Insulin Syringe Ultrafine 30g X 1/2" 0.5 Ml  Misc (Insulin Syringe-Needle U-100) .... Pt Test 1x A Day 7)  Precision Xtra Blood Glucose   Strp (Glucose Blood) .... Uses Four Times Daily 8)  Humalog Pen 100 Unit/ml  Soln (Insulin Lispro (Human)) .... 4 Units Q Supper 9)  Januvia 100 Mg  Tabs (Sitagliptin Phosphate) .Marland Kitchen.. 1 By Mouth Qd 10)  Nitroquick 0.4 .... Use As Directed With Chest Pain As Needed 11)  Lumigan 0.03 % Soln (Bimatoprost) .Marland Kitchen.. 1 Drop in Each Eye Daily 12)  Metoprolol Succinate 25 Mg Xr24h-Tab (Metoprolol Succinate) .Marland Kitchen.. 1 Once Daily If Bp Averages > 130/80 13)  Losartan Potassium-Hctz 100-12.5 Mg Tabs (Losartan Potassium-Hctz) .Marland Kitchen.. 1 Qd 14)  Simvastatin 40 Mg Tabs (Simvastatin) .Marland Kitchen.. 1 At Bedtime** Labs Due** 15)  Aspirin 81 Mg Tbec (Aspirin) .... Take One Tablet Daily 16)  Centrum Silver  Chew (Multiple Vitamins-Minerals) .... Take One Tablet Daily  Allergies: 1)  ! Codeine 2)  ! Ace Inhibitors 3)  ! * Metformin  Physical Exam  General:  Thin ,well-nourished,in no acute distress; appears younger than age;alert,appropriate and cooperative throughout examination Lungs:  Normal respiratory effort, chest expands symmetrically. Lungs are clear to auscultation, no crackles or wheezes. Heart:  normal rate, regular  rhythm, and grade 0000000 /6 systolic murmur.  Increased S2 Abdomen:  Bowel sounds positive,abdomen soft and non-tender without masses, organomegaly or hernias noted. Pulses:  R and L carotid,radial,dorsalis pedis and posterior tibial pulses are full and equal bilaterally. L carotid bruit Extremities:  No clubbing, cyanosis, edema. Good nail health; no ischemic change Neurologic:  alert & oriented X3.   Skin:  Intact without suspicious lesions or  rashes Psych:  memory intact for recent and remote, normally interactive, and good eye contact.     Impression & Recommendations:  Problem # 1:  OTHER AND UNSPECIFIED HYPERLIPIDEMIA (ICD-272.4) Lipids @ goal  His updated medication list for this problem includes:    Simvastatin 40 Mg Tabs (Simvastatin)   Problem # 2:  UNSPECIFIED ESSENTIAL HYPERTENSION (ICD-401.9) Potential interaction between Amlodipine & Simvastatin The following medications were removed from the medication list:    Norvasc 10 Mg Tabs (Amlodipine besylate) .Marland Kitchen... Take one tablet daily    Metoprolol Succinate 25 Mg Xr24h-tab (Metoprolol succinate) .Marland Kitchen... 1 once daily if bp averages > 130/80 His updated medication list for this problem includes:    Losartan Potassium-hctz 100-12.5 Mg Tabs (Losartan potassium-hctz) .Marland Kitchen... 1 qd    Carvedilol 6.25 Mg Tabs (Carvedilol) .Marland Kitchen... 1 two times a day (replaces amlodipine & metoprolol)  Complete Medication List: 1)  Gabapentin 300 Mg Caps (Gabapentin) .Marland Kitchen.. 1 by mouth tid 2)  Lantus 100 Unit/ml Soln (Insulin glargine) .Marland Kitchen.. 10 units qam and 5 units supper 3)  Fish Oil 1000 Mg Caps (Omega-3 fatty acids) .... Take 2 capsule by mouth twice a day 4)  Glucagon Emergency 1 Mg Kit (Glucagon (rdna)) .... Administer 1 mg subcutaneously 5)  Bd Insulin Syringe Ultrafine 30g X 1/2" 0.5 Ml Misc (Insulin syringe-needle u-100) .... Pt test 1x a day 6)  Precision Xtra Blood Glucose Strp (Glucose blood) .... Uses four times daily 7)  Humalog Pen 100 Unit/ml Soln (Insulin lispro (human)) .... 4 units q supper 8)  Januvia 100 Mg Tabs (Sitagliptin phosphate) .Marland Kitchen.. 1 by mouth qd 9)  Nitroquick 0.4  .... Use as directed with chest pain as needed 10)  Lumigan 0.03 % Soln (Bimatoprost) .Marland Kitchen.. 1 drop in each eye daily 11)  Losartan Potassium-hctz 100-12.5 Mg Tabs (Losartan potassium-hctz) .Marland Kitchen.. 1 qd 12)  Simvastatin 40 Mg Tabs (Simvastatin) .Marland Kitchen.. 1 at bedtime 13)  Aspirin 81 Mg Tbec (Aspirin) .... Take one tablet  daily 14)  Centrum Silver Chew (Multiple vitamins-minerals) .... Take one tablet daily 15)  Carvedilol 6.25 Mg Tabs (Carvedilol) .Marland Kitchen.. 1 two times a day (replaces amlodipine & metoprolol)  Patient Instructions: 1)  Check your Blood Pressure regularly. If it is above: 135/85 ON AVERAGE on Carvedilol 6.25 mg twice a day over 10-14 days  you should make an appointment. Prescriptions: SIMVASTATIN 40 MG TABS (SIMVASTATIN) 1 at bedtime  #90 x 2   Entered and Authorized by:   Unice Cobble MD   Signed by:   Unice Cobble MD on 02/11/2010   Method used:   Print then Give to Patient   RxID:   [id number] CARVEDILOL 6.25 MG TABS (CARVEDILOL) 1 two times a day (replaces Amlodipine & Metoprolol)  #180 x 1   Entered and Authorized by:   Unice Cobble MD   Signed by:   Unice Cobble MD on 02/11/2010   Method used:   Print then Give to Patient   RxID:   [id number]

## 2010-07-07 NOTE — Letter (Signed)
Summary: [facility]   Imported By: Edmonia James 03/09/2010 09:07:19  _____________________________________________________________________  External Attachment:    Type:   Image     Comment:   External Document

## 2010-07-07 NOTE — Miscellaneous (Signed)
Summary: Orders Update  Clinical Lists Changes  Orders: Added new Test order of Carotid Duplex (Carotid Duplex) - Signed 

## 2010-07-07 NOTE — Progress Notes (Signed)
Summary: Medication Change Request  Phone Note Call from Patient Call back at Home Phone [phone]   Caller: Patient Summary of Call: Message left on VM: Patient is currently on Vytorin and would like a generic. Patient recieved a letter stating there insurance will cover: Pravastatin, Simvastatin or Lovastatin.  Please give a 90 day supply and call patient when ready for pick-up  Dr.Hopper please advise on med change, last lipid check was 04/07/2009 with [facility]  July 07, 2009 1:10 PM   Follow-up for Phone Call        Simvastatin 40 mg at bedtime  #90, RX 1 Follow-up by: Unice Cobble MD,  July 07, 2009 6:06 PM  Additional Follow-up for Phone Call Additional follow up Details #1::        Spoke with patient, patient would like a 30 day supply sent to a local pharmacy cause he is completely out. 90 day supply was placed @ the front for pick-up Additional Follow-up by: Georgette Dover,  July 08, 2009 10:27 AM    New/Updated Medications: SIMVASTATIN 40 MG TABS (SIMVASTATIN) 1 at bedtime SIMVASTATIN 40 MG TABS (SIMVASTATIN) 1 by mouth at bedtime Prescriptions: SIMVASTATIN 40 MG TABS (SIMVASTATIN) 1 at bedtime  #30 x 0   Entered by:   Georgette Dover   Authorized by:   Unice Cobble MD   Signed by:   Georgette Dover on 07/08/2009   Method used:   Electronically to        [facility] [phone]* (retail)       [place], [place]  [id number]       Ph: [phone] or [phone]       Fax: [phone]   RxID:   [id number] SIMVASTATIN 40 MG TABS (SIMVASTATIN) 1 by mouth at bedtime  #90 x 1   Entered by:   Georgette Dover   Authorized by:   Unice Cobble MD   Signed by:   Georgette Dover on 07/08/2009   Method used:   Print then Give to Patient   RxID:   [id number] SIMVASTATIN 40 MG TABS (SIMVASTATIN) 1 at bedtime  #90 x 1   Entered and Authorized by:   Unice Cobble MD   Signed by:    Georgette Dover on 07/08/2009   Method used:   Print then Give to Patient   RxID:   [id number]

## 2010-07-09 NOTE — Assessment & Plan Note (Signed)
Summary: painful legs//lch   Vital Signs:  Patient profile:   77 year old male Weight:      152.4 pounds BMI:     25.26 O2 Sat:      99 % on Room air Temp:     97.7 degrees F oral Pulse rate:   57 / minute Resp:     16 per minute BP sitting:   160 / 94  (left arm) Cuff size:   large  Vitals Entered By: Georgette Dover CMA (June 23, 2010 9:29 AM)  O2 Flow:  Room air CC: Pain in right leg ( hip-down) x 1-2 weeks, no energy, and SOB, Lower Extremity Joint pain, Back pain   CC:  Pain in right leg ( hip-down) x 1-2 weeks, no energy, and SOB, Lower Extremity Joint pain, and Back pain.  History of Present Illness:      This is an 77 year old man who presents with Lower Extremity  pain X 2 weeks .  The patient denies swelling, redness, giving away, locking, and popping.  The pain is located in the hip & posterior thigh.  The pain began with no injury.  The pain is described as sharp- stabbing, constant, and occuring at rest.  The patient denies the following symptoms: fever, rash, and dysuria.   The patient denies loss of sensation, fecal incontinence, urinary incontinence, and urinary retention.  The pain radiates to the right leg  above  the knee.  The pain is made better by activity and  initially by acetaminophen.         See BP; BP  only checked @ church by Nurse  &  not checked since 11/2009.The patient denies lightheadedness, urinary frequency, claudication and headaches.  Associated symptoms include dyspnea.  The patient denies the following associated symptoms: chest pain, chest pressure, and palpitations.  Compliance with medications (by patient report) has been near 100%.  Adjunctive measures currently used by the patient include salt restriction.    Allergies: 1)  ! Codeine 2)  ! Ace Inhibitors 3)  ! * Metformin  Physical Exam  General:  Thin,in no acute distress; alert,appropriate and cooperative throughout examination Lungs:  Normal respiratory effort, chest expands  symmetrically. Lungs are clear to auscultation, no crackles or wheezes. Heart:  normal rate, regular rhythm, no gallop, no rub, no JVD, no HJR, and grade 1 /6 systolic murmur.   Abdomen:  Bowel sounds positive,abdomen soft and non-tender without masses, organomegaly or hernias noted. No AAA or bruits Msk:  He lay down & sat up w/o help; neg SLR to 90 degrees Pulses:  R and L carotid,radial  pulses are full and equal bilaterally. L carotid bruit. Pedal pulses  palpated  Extremities:  No  edema Neurologic:  alert & oriented X3, strength normal in all extremities, and DTRs symmetrical  but 0+ @ knees . Limping on R   Impression & Recommendations:  Problem # 1:  HIP PAIN, RIGHT (ICD-719.45)  His updated medication list for this problem includes:    Aspirin 81 Mg Tbec (Aspirin) .Marland Kitchen... Take one tablet daily    Meloxicam 7.5 Mg Tabs (Meloxicam) .Marland Kitchen... 1 two times a day as needed for hip pain  Problem # 2:  SCIATICA, RIGHT (ICD-724.3)  His updated medication list for this problem includes:    Aspirin 81 Mg Tbec (Aspirin) .Marland Kitchen... Take one tablet daily    Meloxicam 7.5 Mg Tabs (Meloxicam) .Marland Kitchen... 1 two times a day as needed for hip pain  Problem #  3:  HYPERTENSION, MODERATE (ICD-401.9) uncontrolled His updated medication list for this problem includes:    Losartan Potassium-hctz 100-12.5 Mg Tabs (Losartan potassium-hctz) .Marland Kitchen... 1 qd    Carvedilol 6.25 Mg Tabs (Carvedilol) .Marland Kitchen... 1& 1/2  two times a day (replaces amlodipine & metoprolol)  Complete Medication List: 1)  Gabapentin 300 Mg Caps (Gabapentin) .Marland Kitchen.. 1 by mouth tid 2)  Lantus 100 Unit/ml Soln (Insulin glargine) .Marland Kitchen.. 10 units qam and 5 units supper 3)  Fish Oil 1000 Mg Caps (Omega-3 fatty acids) .... Take 2 capsule by mouth twice a day 4)  Glucagon Emergency 1 Mg Kit (Glucagon (rdna)) .... Administer 1 mg subcutaneously 5)  Bd Insulin Syringe Ultrafine 30g X 1/2" 0.5 Ml Misc (Insulin syringe-needle u-100) .... Pt test 1x a day 6)  Precision  Xtra Blood Glucose Strp (Glucose blood) .... Uses four times daily 7)  Humalog Pen 100 Unit/ml Soln (Insulin lispro (human)) .... 4 units q supper 8)  Januvia 100 Mg Tabs (Sitagliptin phosphate) .Marland Kitchen.. 1 by mouth qd 9)  Nitroquick 0.4  .... Use as directed with chest pain as needed 10)  Lumigan 0.03 % Soln (Bimatoprost) .Marland Kitchen.. 1 drop in each eye daily 11)  Losartan Potassium-hctz 100-12.5 Mg Tabs (Losartan potassium-hctz) .Marland Kitchen.. 1 qd 12)  Simvastatin 40 Mg Tabs (Simvastatin) .Marland Kitchen.. 1 at bedtime 13)  Aspirin 81 Mg Tbec (Aspirin) .... Take one tablet daily 14)  Centrum Silver Chew (Multiple vitamins-minerals) .... Take one tablet daily 15)  Carvedilol 6.25 Mg Tabs (Carvedilol) .Marland Kitchen.. 1& 1/2  two times a day (replaces amlodipine & metoprolol) 16)  Meloxicam 7.5 Mg Tabs (Meloxicam) .Marland Kitchen.. 1 two times a day as needed for hip pain  Patient Instructions: 1)  Please schedule a follow-up appointment in 2 weeks. 2)  Check your Blood Pressure regularly. Your goal = AVERAGE < 140/90. Prescriptions: MELOXICAM 7.5 MG TABS (MELOXICAM) 1 two times a day as needed for hip pain  #20 x 0   Entered and Authorized by:   Unice Cobble MD   Signed by:   Unice Cobble MD on 06/23/2010   Method used:   Electronically to        [facility] [phone]* (retail)       [place], [place]  [id number]       Ph: [phone] or [phone]       Fax: [phone]   RxID:   [id number]    Orders Added: 1)  Est. Patient Level IV [id number]

## 2010-07-15 NOTE — Assessment & Plan Note (Signed)
Summary: 2 WKS OV//PH   Vital Signs:  Patient profile:   77 year old male Weight:      147.8 pounds BMI:     24.50 O2 Sat:      98 % Temp:     98.1 degrees F oral Pulse rate:   63 / minute Resp:     16 per minute BP sitting:   140 / 88  (left arm) Cuff size:   large  Vitals Entered By: Simpson (July 07, 2010 10:33 AM) CC: 1.) SOB  2.) Discuss meds  3.) Ongoing right leg/hip pain , Back pain   CC:  1.) SOB  2.) Discuss meds  3.) Ongoing right leg/hip pain  and Back pain.  History of Present Illness:     Constant R hip pain with RLE radiation to foot  better with Meloxicam, worse sitting in pews & with walking  Umfortunately Meloxicam  caused mental status changes.  The patient denies fever, chills, loss of sensation, fecal incontinence, urinary incontinence, and urinary retention. He is only taking Gabapentin two times a day .   BP has ranged up to 188/97 as per Tomasita Crumble Nurse 01/18.It was 177/98 today @ home. The patient denies lightheadedness, urinary frequency, headaches, and edema.  Associated symptoms include dyspnea.  The patient denies the following associated symptoms: chest pain, chest pressure, palpitations, and syncope.  Adjunctive measures currently used by the patient include salt restriction.    Current Medications (verified): 1)  Gabapentin 300 Mg Caps (Gabapentin) .Marland Kitchen.. 1 By Mouth Tid 2)  Lantus 100 Unit/ml Soln (Insulin Glargine) .Marland Kitchen.. 10 Units Qam and 5 Units Supper 3)  Fish Oil 1000 Mg Caps (Omega-3 Fatty Acids) .... Take 2 Capsule By Mouth Twice A Day 4)  Glucagon Emergency 1 Mg Kit (Glucagon (Rdna)) .... Administer 1 Mg Subcutaneously 5)  Bd Insulin Syringe Ultrafine 30g X 1/2" 0.5 Ml  Misc (Insulin Syringe-Needle U-100) .... Pt Test 1x A Day 6)  Precision Xtra Blood Glucose   Strp (Glucose Blood) .... Uses Four Times Daily 7)  Humalog Pen 100 Unit/ml  Soln (Insulin Lispro (Human)) .... 4 Units Q Supper 8)  Januvia 100 Mg  Tabs (Sitagliptin Phosphate)  .Marland Kitchen.. 1 By Mouth Qd 9)  Nitroquick 0.4 .... Use As Directed With Chest Pain As Needed 10)  Lumigan 0.03 % Soln (Bimatoprost) .Marland Kitchen.. 1 Drop in Each Eye Daily 11)  Losartan Potassium-Hctz 100-12.5 Mg Tabs (Losartan Potassium-Hctz) .Marland Kitchen.. 1 Qd 12)  Simvastatin 40 Mg Tabs (Simvastatin) .Marland Kitchen.. 1 At Bedtime 13)  Aspirin 81 Mg Tbec (Aspirin) .... Take One Tablet Daily 14)  Centrum Silver  Chew (Multiple Vitamins-Minerals) .... Take One Tablet Daily 15)  Carvedilol 6.25 Mg Tabs (Carvedilol) .Marland Kitchen.. 1& 1/2  Two Times A Day (Replaces Amlodipine & Metoprolol) 16)  Meloxicam 7.5 Mg Tabs (Meloxicam) .Marland Kitchen.. 1 Two Times A Day As Needed For Hip Pain  Allergies: 1)  ! Codeine 2)  ! Ace Inhibitors 3)  ! * Metformin 4)  ! Meloxicam (Meloxicam)  Physical Exam  General:  in no acute distress; alert,appropriate and cooperative throughout examination Lungs:  Normal respiratory effort, chest expands symmetrically. Lungs are clear to auscultation, no crackles or wheezes. Heart:  normal rate, regular rhythm, no gallop, no rub, no JVD, no HJR, and grade 1 /6 systolic murmur.   Abdomen:  Bowel sounds positive,abdomen soft and non-tender without masses, organomegaly or hernias noted. No AAA or bruits Msk:  No deformity or scoliosis noted of thoracic or lumbar  spine.  He lay down & sat up w/o help Pulses:  R and L carotid,radia,dorsalis pedis and posterior tibial pulses are full and equal bilaterally. L carotid bruit. Extremities:  No clubbing, cyanosis, edema.Normal full range of motion of all joints.   Neg SLR to 80 degrees RLE Neurologic:  alert & oriented X3, strength normal in all extremities, gait abnormal ( limp on R; retropulsion with heel walking), and DTRs symmetrical and 0-1/2+   Impression & Recommendations:  Problem # 1:  LUMBAR RADICULOPATHY, RIGHT (ICD-724.4)  L-5 His updated medication list for this problem includes:    Aspirin 81 Mg Tbec (Aspirin) .Marland Kitchen... Take one tablet daily    Meloxicam 7.5 Mg Tabs  (Meloxicam) .Marland Kitchen... 1 two times a day as needed for hip pain  Orders: T-Lumbar Spine w/Flex & Ext 4 Views [id number])  Problem # 2:  ADVERSE DRUG REACTION (ICD-995.20) Mental status changes with Meloxicam  Problem # 3:  HYPERTENSION, UNCONTROLLED (ICD-401.9)  His updated medication list for this problem includes:    Losartan Potassium-hctz 100-12.5 Mg Tabs (Losartan potassium-hctz) .Marland Kitchen... 1 qd    Carvedilol 6.25 Mg Tabs (Carvedilol) .Marland Kitchen... 2 pills twice  a day (replaces amlodipine & metoprolol)  Complete Medication List: 1)  Gabapentin 300 Mg Caps (Gabapentin) .Marland Kitchen.. 1 by mouth tid 2)  Lantus 100 Unit/ml Soln (Insulin glargine) .Marland Kitchen.. 10 units qam and 5 units supper 3)  Fish Oil 1000 Mg Caps (Omega-3 fatty acids) .... Take 2 capsule by mouth twice a day 4)  Glucagon Emergency 1 Mg Kit (Glucagon (rdna)) .... Administer 1 mg subcutaneously 5)  Bd Insulin Syringe Ultrafine 30g X 1/2" 0.5 Ml Misc (Insulin syringe-needle u-100) .... Pt test 1x a day 6)  Precision Xtra Blood Glucose Strp (Glucose blood) .... Uses four times daily 7)  Humalog Pen 100 Unit/ml Soln (Insulin lispro (human)) .... 4 units q supper 8)  Januvia 100 Mg Tabs (Sitagliptin phosphate) .Marland Kitchen.. 1 by mouth qd 9)  Nitroquick 0.4  .... Use as directed with chest pain as needed 10)  Lumigan 0.03 % Soln (Bimatoprost) .Marland Kitchen.. 1 drop in each eye daily 11)  Losartan Potassium-hctz 100-12.5 Mg Tabs (Losartan potassium-hctz) .Marland Kitchen.. 1 qd 12)  Simvastatin 40 Mg Tabs (Simvastatin) .Marland Kitchen.. 1 at bedtime 13)  Aspirin 81 Mg Tbec (Aspirin) .... Take one tablet daily 14)  Centrum Silver Chew (Multiple vitamins-minerals) .... Take one tablet daily 15)  Carvedilol 6.25 Mg Tabs (Carvedilol) .... 2 pills twice  a day (replaces amlodipine & metoprolol) 16)  Meloxicam 7.5 Mg Tabs (Meloxicam) .Marland Kitchen.. 1 two times a day as needed for hip pain  Patient Instructions: 1)  INCREASE Carvedilol to TWO pills two times a day . 2)  Check your Blood Pressure regularly. Your goal <  140/90. 3)  Take 650-1000mg  of Tylenol every 4-6 hours as needed for relief of pain or comfort of fever AVOID taking more than 4000mg   in a 24 hour period (can cause liver damage in higher doses). Go back to Gabapentin 300 mg two times a day & at bedtime .   Orders Added: 1)  Est. Patient Level IV [id number] 2)  T-Lumbar Spine w/Flex & Ext 4 Views [72120TC]

## 2010-07-22 ENCOUNTER — Encounter: Payer: Self-pay | Admitting: Internal Medicine

## 2010-07-23 ENCOUNTER — Encounter: Payer: Self-pay | Admitting: Internal Medicine

## 2010-07-23 ENCOUNTER — Other Ambulatory Visit: Payer: Self-pay | Admitting: Neurosurgery

## 2010-07-23 ENCOUNTER — Telehealth: Payer: Self-pay | Admitting: Internal Medicine

## 2010-07-23 DIAGNOSIS — M545 Low back pain, unspecified: Secondary | ICD-10-CM

## 2010-07-23 NOTE — Letter (Signed)
Summary: [facility]   Imported By: Phillis Knack 07/14/2010 10:28:05  _____________________________________________________________________  External Attachment:    Type:   Image     Comment:   External Document

## 2010-07-27 ENCOUNTER — Telehealth: Payer: Self-pay | Admitting: Internal Medicine

## 2010-07-29 ENCOUNTER — Ambulatory Visit
Admission: RE | Admit: 2010-07-29 | Discharge: 2010-07-29 | Disposition: A | Discharge status: Home / Self Care | Payer: Medicare Other | Source: Ambulatory Visit | Attending: Neurosurgery | Admitting: Neurosurgery

## 2010-07-29 DIAGNOSIS — M545 Low back pain: Secondary | ICD-10-CM

## 2010-07-29 MED ORDER — GADOBENATE DIMEGLUMINE 529 MG/ML IV SOLN: 14.0000 mL | Freq: Once | INTRAVENOUS | Status: AC | PRN

## 2010-07-29 NOTE — Progress Notes (Signed)
Summary: Communication via fax  Phone Note Other Incoming   Summary of Call: The office received faxed letter and calendars from patient spouse showing patient BP readings. She notes that the patient is taking his meds and would like MD advisements. Please review and advise. Initial call taken by: Ernestene Mention CMA,  July 23, 2010 4:40 PM  Follow-up for Phone Call        now on Carvedilol 6.25 mg 2 pills two times a day ; increase to 3 two times a day until used up. After that dose will be changed to 25 mg two times a day which is usual  dose if BP not < 140/90 on average. BP cuff needs to be taken to every MD appt . I reviewed chart;BP readings not as high in office as recorded @ home. Reliability of cuff needs to be verified. Follow-up by: Unice Cobble MD,  July 23, 2010 5:13 PM     Appended Document: Communication via fax Patient notified of the above and will call me when he gets close to being out of his current pills. Patient was notified per MD that Tylenol and Acetaminophen are the same med. Patient expressed understanding.   Appended Document: Communication via fax Patient spouse notified.

## 2010-08-04 NOTE — Letter (Signed)
Summary: BP Log from Patients Wife  BP Log from Patients Wife   Imported By: Rise Patience 07/28/2010 16:07:20  _____________________________________________________________________  External Attachment:    Type:   Image     Comment:   External Document

## 2010-08-04 NOTE — Progress Notes (Signed)
Summary: Carvedilol change  Phone Note Call from Patient Call back at Home Phone [phone]   Summary of Call: Patient spouse called to notify that he is finishing up the Carvedilol 6.25, Previ[facility] phone note documents med change once current dosage is completed. Patient spouse aware that we will send prescription to pharmacy directly.  Initial call taken by: Ernestene Mention CMA,  July 27, 2010 8:55 AM    New/Updated Medications: CARVEDILOL 25 MG TABS (CARVEDILOL) 1 by mouth two times a day Prescriptions: CARVEDILOL 25 MG TABS (CARVEDILOL) 1 by mouth two times a day  #60 x 3   Entered by:   Ernestene Mention CMA   Authorized by:   Unice Cobble MD   Signed by:   Ernestene Mention CMA on 07/27/2010   Method used:   Electronically to        [facility] [phone]* (retail)       [place], [place]  [id number]       Ph: [phone] or [phone]       Fax: [phone]   RxID:   [id number]

## 2010-08-10 ENCOUNTER — Encounter: Payer: Self-pay | Admitting: Internal Medicine

## 2010-08-13 NOTE — Letter (Signed)
Summary: [facility]  [facility]   Imported By: Laural Benes 08/04/2010 12:48:38  _____________________________________________________________________  External Attachment:    Type:   Image     Comment:   External Document

## 2010-08-21 ENCOUNTER — Encounter: Payer: Self-pay | Admitting: Internal Medicine

## 2010-08-21 ENCOUNTER — Ambulatory Visit (INDEPENDENT_AMBULATORY_CARE_PROVIDER_SITE_OTHER): Payer: Medicare Other | Admitting: Internal Medicine

## 2010-08-21 ENCOUNTER — Telehealth (INDEPENDENT_AMBULATORY_CARE_PROVIDER_SITE_OTHER): Payer: Self-pay | Admitting: *Deleted

## 2010-08-21 DIAGNOSIS — I10 Essential (primary) hypertension: Secondary | ICD-10-CM

## 2010-08-25 NOTE — Letter (Signed)
Summary: [facility]  [facility]   Imported By: Laural Benes 08/20/2010 09:43:14  _____________________________________________________________________  External Attachment:    Type:   Image     Comment:   External Document

## 2010-08-25 NOTE — Progress Notes (Signed)
Summary: BP elevated- appt this afternoon  Phone Note From Other Clinic   Caller: Dr.Stern Summary of Call: Dr.Sterns office called pt was supposed to get a pain injection his BP was 209/110, pain injection was not given. Pt made appt for this afternoon to see Dr.Hopper. Allyn Kenner CMA  August 21, 2010 2:37 PM   Follow-up for Phone Call        Noted Follow-up by: Georgette Dover CMA,  August 21, 2010 2:37 PM

## 2010-08-25 NOTE — Assessment & Plan Note (Signed)
Summary: BP eleavated/drb   Vital Signs:  Patient profile:   77 year old male Weight:      146 pounds Temp:     98.4 degrees F oral Pulse rate:   72 / minute Resp:     16 per minute BP sitting:   200 / 94  (left arm) Cuff size:   large  Vitals Entered By: Georgette Dover CMA (August 21, 2010 4:28 PM) CC: Elevated B/P   CC:  Elevated B/P.  History of Present Illness:    BP @ Dr Melven Sartorius office was 200/? ; ESI was  He  reports urinary frequency and fatigue, but denies lightheadedness, headaches, and edema.  Associated symptoms include dyspnea.  The patient denies the following associated symptoms: chest pain, palpitations, and syncope.  Compliance with medications (by patient report) has been near 100%.  Adjunctive measures currently used by the patient include salt restriction.    Allergies: 1)  ! Codeine 2)  ! Ace Inhibitors 3)  ! * Metformin 4)  ! Meloxicam (Meloxicam)  Physical Exam  General:  Appears younger than age ,in no acute distress; alert,appropriate and cooperative throughout examination Lungs:  Normal respiratory effort, chest expands symmetrically. Lungs are clear to auscultation, no crackles or wheezes. Heart:  Normal rate and regular rhythm. S1 and S2 normal without gallop, murmur, click, rub s4 with slurring Abdomen:  Bowel sounds positive,abdomen soft and non-tender without masses, organomegaly or hernias noted. No AAA or bruits Pulses:  R and L carotid,radial,dorsalis pedis and posterior tibial pulses are full and equal bilaterally. L carotid bruit   Impression & Recommendations:  Problem # 1:  HYPERTENSION, UNCONTROLLED (ICD-401.9)  His updated medication list for this problem includes:    Losartan Potassium-hctz 100-12.5 Mg Tabs (Losartan potassium-hctz) .Marland Kitchen... 1 once daily ( See Instructions)    Carvedilol 25 Mg Tabs (Carvedilol) .Marland Kitchen... 1 by mouth two times a day  Complete Medication List: 1)  Gabapentin 300 Mg Caps (Gabapentin) .Marland Kitchen.. 1 by mouth tid 2)   Lantus 100 Unit/ml Soln (Insulin glargine) .Marland Kitchen.. 10 units qam and 5 units supper (amount of units can vary based on patient's bloodsugar readings) 3)  Fish Oil 1000 Mg Caps (Omega-3 fatty acids) .... Take 2 capsule by mouth twice a day 4)  Glucagon Emergency 1 Mg Kit (Glucagon (rdna)) .... Administer 1 mg subcutaneously 5)  Bd Insulin Syringe Ultrafine 30g X 1/2" 0.5 Ml Misc (Insulin syringe-needle u-100) .... Pt test 1x a day 6)  Precision Xtra Blood Glucose Strp (Glucose blood) .... Uses four times daily 7)  Humalog Pen 100 Unit/ml Soln (Insulin lispro (human)) .... 4 units q supper (amount of units can vary based on patient's bloodsugar readings) 8)  Januvia 100 Mg Tabs (Sitagliptin phosphate) .... 1/2 by mouth once daily 9)  Nitroquick 0.4  .... Use as directed with chest pain as needed 10)  Lumigan 0.03 % Soln (Bimatoprost) .Marland Kitchen.. 1 drop in each eye daily 11)  Losartan Potassium-hctz 100-12.5 Mg Tabs (Losartan potassium-hctz) .Marland Kitchen.. 1 qd 12)  Simvastatin 40 Mg Tabs (Simvastatin) .Marland Kitchen.. 1 at bedtime 13)  Aspirin 81 Mg Tbec (Aspirin) .... Take one tablet daily 14)  Centrum Silver Chew (Multiple vitamins-minerals) .... Take one tablet daily 15)  Carvedilol 25 Mg Tabs (Carvedilol) .Marland Kitchen.. 1 by mouth two times a day 16)  Meloxicam 7.5 Mg Tabs (Meloxicam) .Marland Kitchen.. 1 two times a day as needed for hip pain  Patient Instructions: 1)  HOLD Losartan /HCTZ. Take samples of Tribenzor 40/10/25mg  once daily .;  bring BP cuff to that appt.   Orders Added: 1)  Est. Patient Level III [id number]

## 2010-08-26 LAB — CBC
MCHC: 34.1 g/dL (ref 30.0–36.0)
MCV: 88.8 fL (ref 78.0–100.0)
Platelets: 106 10*3/uL — ABNORMAL LOW (ref 150–400)
RBC: 5.01 MIL/uL (ref 4.22–5.81)
WBC: 4.1 10*3/uL (ref 4.0–10.5)
WBC: 5.7 10*3/uL (ref 4.0–10.5)

## 2010-08-26 LAB — GLUCOSE, CAPILLARY
Glucose-Capillary: 163 mg/dL — ABNORMAL HIGH (ref 70–99)
Glucose-Capillary: 282 mg/dL — ABNORMAL HIGH (ref 70–99)
Glucose-Capillary: 86 mg/dL (ref 70–99)

## 2010-08-26 LAB — URINALYSIS, ROUTINE W REFLEX MICROSCOPIC
Ketones, ur: NEGATIVE mg/dL
Nitrite: NEGATIVE
Protein, ur: NEGATIVE mg/dL
pH: 7.5 (ref 5.0–8.0)

## 2010-08-26 LAB — COMPREHENSIVE METABOLIC PANEL
AST: 21 U/L (ref 0–37)
Albumin: 3 g/dL — ABNORMAL LOW (ref 3.5–5.2)
Chloride: 106 mEq/L (ref 96–112)
Creatinine, Ser: 1.37 mg/dL (ref 0.4–1.5)
GFR calc Af Amer: 59 mL/min — ABNORMAL LOW (ref >60)
Potassium: 3.2 mEq/L — ABNORMAL LOW (ref 3.5–5.1)
Total Bilirubin: 0.5 mg/dL (ref 0.3–1.2)

## 2010-08-26 LAB — CARDIAC PANEL(CRET KIN+CKTOT+MB+TROPI)
Relative Index: 1.3 (ref 0.0–2.5)
Relative Index: 1.5 (ref 0.0–2.5)
Total CK: 227 U/L (ref 7–232)
Total CK: 230 U/L (ref 7–232)
Troponin I: 0.03 ng/mL (ref 0.00–0.06)

## 2010-08-26 LAB — DIFFERENTIAL
Basophils Relative: 0 % (ref 0–1)
Lymphocytes Relative: 20 % (ref 12–46)
Monocytes Relative: 9 % (ref 3–12)
Neutro Abs: 4 10*3/uL (ref 1.7–7.7)
Neutrophils Relative %: 70 % (ref 43–77)

## 2010-08-26 LAB — POCT I-STAT, CHEM 8
Calcium, Ion: 0.89 mmol/L — ABNORMAL LOW (ref 1.12–1.32)
Chloride: 108 mEq/L (ref 96–112)
HCT: 44 % (ref 39.0–52.0)
TCO2: 22 mmol/L (ref 0–100)

## 2010-08-26 LAB — POCT CARDIAC MARKERS

## 2010-08-26 LAB — CK TOTAL AND CKMB (NOT AT ARMC): Total CK: 281 U/L — ABNORMAL HIGH (ref 7–232)

## 2010-08-26 LAB — PROTIME-INR: INR: 1.08 (ref 0.00–1.49)

## 2010-09-03 ENCOUNTER — Encounter: Payer: Self-pay | Admitting: Internal Medicine

## 2010-09-04 ENCOUNTER — Ambulatory Visit (INDEPENDENT_AMBULATORY_CARE_PROVIDER_SITE_OTHER): Payer: Medicare Other | Admitting: Internal Medicine

## 2010-09-04 ENCOUNTER — Encounter: Payer: Self-pay | Admitting: Internal Medicine

## 2010-09-04 VITALS — BP 144/72 | HR 56 | Temp 97.6°F | Ht 66.25 in | Wt 151.6 lb

## 2010-09-04 DIAGNOSIS — I10 Essential (primary) hypertension: Secondary | ICD-10-CM

## 2010-09-04 MED ORDER — AMLODIPINE BESYLATE 10 MG PO TABS: 10.0000 mg | ORAL_TABLET | Freq: Every day | ORAL | Status: DC

## 2010-09-04 MED ORDER — LOSARTAN POTASSIUM-HCTZ 100-25 MG PO TABS: 1.0000 | ORAL_TABLET | Freq: Every day | ORAL | Status: DC

## 2010-09-04 NOTE — Progress Notes (Signed)
  Subjective:    Patient ID: [name], male    DOB: Mar 30, 1921, 77 y.o.   MRN: [id number]  HPI BP 139/83-184/102; it responded to Tribenzor 40/10/25 mg samples.Signs and symptoms of hypertension were reviewed. These included urinary frequency; fatigue; lightheadedness; postural hypotension; chest pain; palpitations; dyspnea(rest, exertional & paroxysmal nocturnal); headache; epistaxis;  edema; claudication; and frank syncope. Additionally neurologic symptoms of weakness; paresthesias; and temporary paralysis were discussed. The only + was polyuria w/o other GU symptoms.    Review of Systems     Objective:   Physical Exam He is in no acute distress; he appears younger than stated age  Chest is clear without rales or rhonchi.  Grade 1.5 systolic murmur along left sternal border.  The left carotid bruit is unchanged.  All pulses are intact.  He has no cyanosis, clubbing or, or edema.  He's oriented x3          Assessment & Plan:  #1 blood pressure improved on the triple agent. It's unlikely his insurance will cover this. He would not be allowed to use discount coupons  As he is on Medicare.  Plan: He will be prescribed Will start her milligrams; amlodipine 10 mg; and hydrochlorothiazide 25 mg to approximate the present prescription which appears to be improving his blood pressure.

## 2010-09-04 NOTE — Patient Instructions (Signed)
The Tribenzor  will be changed to losartan/HCT 100/25. Amlodipine 10 mg daily will be continued. Monitor your blood pressure; goal is an average  less than 140/90. Please repeat fasting labs in 10 weeks: Hepatic panel, lipids, BUN, creatinine, potassium (401.9, 272.4, 995.2). Please bring your blood pressure cuff to office visits. As we discussed because of the need to increase the dose of medications in the context  multiple health problems &  dvanced age we will need to recheck these labs in 10 weeks. Most important is control the blood pressure to decrease the risk of stroke or heart attack.

## 2010-10-23 NOTE — Op Note (Signed)
Leonard [name], Leonard [name]                         ACCOUNT NO.:  [id number]   MEDICAL RECORD NO.:  [id number]                   PATIENT TYPE:  INP   LOCATION:  2899                                 FACILITY:  [facility]   PHYSICIAN:  Marchia Meiers. Vertell Limber, M.D.               DATE OF BIRTH:  10-Feb-1921   DATE OF PROCEDURE:  01/21/2004  DATE OF DISCHARGE:                                 OPERATIVE REPORT   PREOPERATIVE DIAGNOSIS:  Grade 2 spondylolisthesis L5 on S1 with  spondylolysis at L5 with central disk herniation, L4-5 with degenerative  disk disease, lumbar radiculopathy and spondylosis.   POSTOPERATIVE DIAGNOSIS:  Grade 2 spondylolisthesis L5 on S1 with  spondylolysis at L5 with central disk herniation, L4-5 with degenerative  disk disease, lumbar radiculopathy and spondylosis.   OPERATION PERFORMED:  L5 Gill procedure with transverse lumbar interbody  fusion, L4 through 5 level with pedicle screw fixation, L4 through S1  bilaterally with posterolateral arthrodesis, L4 to sacrum bilaterally using  morcelized bone autograft and Healos sponge soaked in marrow rich blood.   SURGEON:  Marchia Meiers. Vertell Limber, M.D.   ASSISTANT:  Otilio Connors, M.D.   ANESTHESIA:  General endotracheal.   ESTIMATED BLOOD LOSS:  700 mL with 400 mL of Cell Saver blood returned to  the patient.   COMPLICATIONS:  None.   DISPOSITION:  Recovery.   INDICATIONS FOR PROCEDURE:  Leonard [name] is an 77 year old man with grade 2  spondylolisthesis of L5 on S1 with spondylolysis of L5.  He had a central  disk protrusion at L4-5 with retrolisthesis at L4 and L5.  He has been on  methadone and despite chronic pain management has had severe persistent and  increasing  pain.  It was elected to take him to surgery for decompression  and fusion.   DESCRIPTION OF PROCEDURE:  Leonard [name] was brought to the operating room.  Following the satisfactory and uncomplicated induction of general  endotracheal anesthesia and placement of  intravenous lines, the patient was  placed in a prone position on the operating table.  The low back was then  prepped and draped in the usual sterile fashion.  The area of planned  incision was infiltrated with 0.25% Marcaine, 0.5% lidocaine, 1:200,000  epinephrine.  Incision was made in the midline overlying the L4 spinous  process to the top of the sacrum, carried to the subcutaneous tissues to the  lumbodorsal fascia which was incised bilaterally.  Subsequently,  subperiosteal dissection was performed exposing the transverse processes of  L4 bilaterally.  The L5 transverse processes were deep to these and tucked  behind the sacrum.  The sacral ala were exposed bilaterally.  An  intraoperative x-ray confirmed correct orientation.  Subsequently, an L5  Gill procedure was performed with a total laminectomy of L5.  There was a  significant amount of spondylotic material overlying the L5 nerve roots and  these were decompressed widely  as they exited out the neural foramina.  The  right L5 nerve root was quite deep and was covered by a shelf of sacral bone  which was carefully removed with drill and Kerrison rongeurs under loupe  magnification.  The left-sided nerve root was quite a bit higher and was  also significantly compressed and this was decompressed as well.  It was  felt that it would be very difficult to perform an interbody fusion at the  L5-S1 level because of the anatomy of the nerve roots and significant slip,  but the central disk protrusion at L4-5 was subsequently removed on the  right side and the interspace was cleared of residual disk material.  The  disk space spreader was placed on the left and subsequently using a variety  of Alphatek disk preparation instruments, the end plates of L4 and L5 were  stripped of residual disk material and subsequently using a 12 mm TLIF  spacer, this was inserted into the interspace and countersunk appropriately.  Good position was  confirmed on fluoroscopy.  Morcelized bone autograft was  packed into spacer and then packed overlying the implant after it was tamped  into position.  Subsequently, pedicle screws were placed using 40 mm x 6.5  mm Alphatek sacral screw on S1 on the right, a 50 mm x 5.5 mm screw at L5  because the pedicles appeared to be somewhat attenuated and a 45 mm x 6.5 mm  screw at L4.  Similar screw placement was performed on the left but on the  left, the sacral screw did not have good purchase and subsequently it was  elected to perform a bicortical positioning of the sacral screw and a 7.5 mm  screw was placed on the left.  There were no cutouts of the sacral pedicle.  A 5.5 x 50 mm screw was placed at L5 on the left and a 45 x 6.5 mm screw was  placed at L4 on the left.  The transverse processes of L4, L5 and the sacral  ala were then decorticated and further exposed.  A Healos sponge was soaked  in the marrow rich blood aspirated from the pedicle tract and this was then  placed in the posterolateral region and then morcelized bone autograft was  positioned over the Healos sponge.  Prior to doing so, the wound was  copiously irrigated with bacitracin saline.  There was no evidence of any  CSF leak or injury to nerve roots.  A 50 mm rod was placed on the right and  a 60 mm rod was placed on the left.  The locking caps were then placed and  torqued to 100 inch pounds.  A self-retaining retractor was removed.  The  lumbodorsal fascia was closed with 1 Vicryl suture.  The subcutaneous tissue  were reapproximated with 2-0 Vicryl interrupted inverted sutures and the  skin edges were reapproximated with interrupted 3-0 Vicryl subcuticular  stitch.  The wound was dressed with benzoin, Steri-Strips, Telfa gauze and  tape.  The patient was extubated in the operating room and taken to the  recovery room in stable and satisfactory condition having tolerated the  operation well.  Counts were correct at the end  of the case.  Marchia Meiers. Vertell Limber, M.D.    JDS/MEDQ  D:  01/21/2004  T:  01/21/2004  Job:  [id number]

## 2010-10-23 NOTE — Discharge Summary (Signed)
[name], [name] NO.:  [id number]   MEDICAL RECORD NO.:  [id number]          PATIENT TYPE:  INP   LOCATION:  3033                         FACILITY:  [facility]   PHYSICIAN:  Marchia Meiers. Vertell Limber, M.D.  DATE OF BIRTH:  1921-03-24   DATE OF ADMISSION:  01/21/2004  DATE OF DISCHARGE:  01/27/2004                                 DISCHARGE SUMMARY   REASON FOR ADMISSION:  1.  Spondylolisthesis.  2.  Lumbosacral spondylosis, and spondylolysis.  3.  Lumbar disk displacement.  4.  Hypertension.  5.  Type 2 diabetes.  6.  Status post aortocoronary bypass.  7.  Coronary atherosclerosis.  8.  History of prostatic malignancy.  9.  Lumbosacral disk degeneration.   FINAL DIAGNOSES:  1.  Spondylolisthesis.  2.  Lumbosacral spondylosis, and spondylolysis.  3.  Lumbar disk displacement.  4.  Hypertension.  5.  Type 2 diabetes.  6.  Status post aortocoronary bypass.  7.  Coronary atherosclerosis.  8.  History of prostatic malignancy.  9.  Lumbosacral disk degeneration.   HISTORY OF ILLNESS AND HOSPITAL COURSE:  Leonard [name] is an 77 year old man  with severe low back pain with grade 2 spondylolistheis of L5 and S1 and  spondylosis at L5 with herniated disk at L4-5, radiculopathy secondary to  degenerative changes at L4-5 and L5-S1 levels.  He is on chronic methadone  and is in severe pain.  It was therefore elected to take him to surgery for  decompression and fusion of the affected levels.  This was done on the 16th.  Postoperatively, he was gradually mobilized and doing quite well.  He did  have low oxygen saturation on the 18th, was gradually mobilized, and was  felt to be a good candidate for inpatient rehabilitation.  The patient was  then transferred to the rehab service on January 27, 2004, for further  inpatient rehabilitation.  The patient was then transferred to the rehab  service on January 27, 2004, for further inpatient rehabilitation.       JDS/MEDQ  D:   02/28/2004  T:  02/29/2004  Job:  [id number]

## 2010-10-23 NOTE — Discharge Summary (Signed)
NAMEGARN, Leonard [name]                         ACCOUNT NO.:  [id number]   MEDICAL RECORD NO.:  [id number]                   PATIENT TYPE:  IPS   LOCATION:  4012                                 FACILITY:  [facility]   PHYSICIAN:  Jarvis Morgan, M.D.                DATE OF BIRTH:  05-Aug-1920   DATE OF ADMISSION:  01/27/2004  DATE OF DISCHARGE:  01/31/2004                                 DISCHARGE SUMMARY   DISCHARGE DIAGNOSES:  1. L5 Gill procedure with L4-L5 lumbar fusion, L4-S1 arthrodesis.  2. Diabetes mellitus, type 2.  3. Renal insufficiency.  4. Hyponatremia, improving.  5. Hypertension.   HISTORY OF PRESENT ILLNESS:  Mr. Krieger is an 77 year old male with a  history of prostate cancer, coronary artery disease, L5 on S1  spondylolisthesis, essential HNP, L4-L5 DJD with lumbar radiculopathy and  spondylosis, elected to undergo L5 Gill procedure with lumbar fusion L4-L5  with screw fixation and L4-S1 arthrodesis by Dr. Vertell Limber on January 21, 2004.  Post-op has had problems including fevers and disorientation.  Morphine  methadone was D/C'd with improvement in mental status.  Fevers are noted to  be resolving.  The patient was mobilized and noted to be at min-assist for  bed mobility, min-assist for transfers, close supervision to contact guard  assist to ambulate 125 feet x 2.  He is min-assist for low body dressing,  mod-assist to don brace.   PAST MEDICAL HISTORY:  1. Coronary artery disease, status post CABG in 1984, with redo in 1997.  2. DM type 2.  3. __________  prostate cancer.  4. Hiatal hernia.  5. Anorexia secondary to pain.  6. Recent weight loss with negative workup.  7. Skin cancer.  8. Decreased urine.  9. Dyslipidemia.  10.      Carotid bruit bilaterally with less than 30% stenosis.  11.      Diabetic retinopathy.   ALLERGIES:  1. CODEINE.  2. ACE INHIBITORS.   SOCIAL HISTORY:  The patient is married, lives in a one-level home with two  steps at entry.  Wife  very supportive.  The patient does not use any tobacco  or alcohol.   HOSPITAL COURSE:  Mr. Mylo Petite was admitted to rehab, on January 27, 2004, for inpatient therapies to consist of PT, OT daily.  Past admission  secondary to patient's decrease in mobility.  Subcu Lovenox was added for  DVT prophylaxis.  The patient reported issues with nocturia and incontinence  and Vesicare was resumed.  Blood sugars were monitored on a q.i.d. basis.  Lantus was resumed h.s.  Labs done past admission showed H&H of 10.4 and  30.0.  Check of lytes showed sodium 129, potassium 3.5, chloride 94, CO2 27,  BUN 19, creatinine 1.4, glucose 171.  Secondary to the patient's low protein  stores as well as creatinine level, pharmacy expressed concerns regarding  the use of Glucophage.  The patient's Hyzaar was changed to Cozaar and  hydrochlorothiazide was D/C'd to help with hyponatremia.  Recheck labs,  prior to discharge, show some worsening of renal status with BUN rising to  29, creatinine rising to 1.6, sodium stable at 129.  The patient was taken  off Glucophage at the time of discharge.  Starlix was increased to t.i.d.  a.c.  Family is to continue monitoring blood sugars on a b.i.d. basis  rotating and follow up with Dr. Linna Darner in two weeks for further adjustment  of meds as needed.  The patient's back incision has healed well without any  signs or symptoms of infection, no drainage or erythema noted.  Pain control  was initially an issue, especially in terms of spasms of the left lower  extremity.  Flexeril was started at 5 mg t.i.d. and slowly titrated to 10 mg  t.i.d. with improvement in symptomatology.  Oxycodone is used for  breakthrough pain.  Blood sugars at the time of discharge are ranging from  120s to an occasional high of 160s.  The patient is alert and oriented x 3  without any signs of confusion or disorientation on current pain meds.  During his stay in rehab, the patient progressed to  being independent for  bed mobility at supervision, with a rolling walker for transfers  supervision, with a rolling walker for ambulation of 400 feet.  He is at  setup assist for upper body care.  Setup for low body care.  He is at  supervision for toileting, supervision for advanced ADLs.   Further followup therapies to include home health PT, [place].   On January 31, 2004, the patient is discharged to home.   DISCHARGE MEDICATIONS:  1. Lantus insulin 7 units q.h.s.  2. Norvasc 10 mg a day.  3. Vitamin C 500 mg a day.  4. Zocor 40 mg a day.  5. Flexeril 10 mg t.i.d.  6. Cozaar 50 mg a day.  7. Starlix 120 mg t.i.d.  8. Vesicare 5 mg q.h.s.  9. Neurontin 300 mg t.i.d.  10.      Senokot S two p.o. q.h.s.  11.      Glucophage to be held right now.  12.      A coated aspirin 325 mg a day.  13.      OxyIR 5-10 mg q.4-6h. p.r.n. pain.   ACTIVITY:  Supervision for now, routine back precautions, wear brace when  out of bed.   DIET:  Diabetic diet.   Check blood sugars twice a day.   WOUND CARE:  Keep area clean and dry.   SPECIAL INSTRUCTIONS:  No alcohol, no smoking, no driving.   FOLLOW UP:  1. The patient to follow up with Dr. Vertell Limber next week.  2. Follow up with Dr. Linna Darner in two weeks.  3. Follow up with Dr. Jonette Eva as needed.      Thornton Dales, P.A.                    Jarvis Morgan, M.D.    PP/MEDQ  D:  01/31/2004  T:  02/02/2004  Job:  [id number]   cc:   Marchia Meiers. Vertell Limber, M.D.  [address]  [place]  [place] [address]  Fax: [phone]   Darrick Penna. Linna Darner, M.D. [facility]

## 2010-10-23 NOTE — Cardiovascular Report (Signed)
Leonard [name], Leonard [name]                         ACCOUNT NO.:  [id number]   MEDICAL RECORD NO.:  [id number]                   PATIENT TYPE:  OIB   LOCATION:  6501                                 FACILITY:  [facility]   PHYSICIAN:  Eustace Quail, M.D. [facility]              DATE OF BIRTH:  06-26-1920   DATE OF PROCEDURE:  DATE OF DISCHARGE:  12/06/2003                              CARDIAC CATHETERIZATION   PROCEDURE:  Cardiac catheterization.   CLINICAL HISTORY:  This patient is 77 years old and had redo bypass  performed in 1997.  He has done well since that time. He has spinal stenosis  and degenerative lumbar disk disease and surgery had seen recommended and we  saw him in preoperative consultation and did a Cardiolite scan which  suggested apical ischemia.  For this reason he was scheduled for evaluation  with angiography in the [place] lab.   CARDIOLOGIST:  Eustace Quail, M.D.   PROCEDURE DETAILS:  The procedure was performed via the right femoral artery  utilizing an arterial sheath and 4-French preformed coronary catheters. A  LIMA cath was used for injection of the LIMA graft.  A distal aortogram was  performed to rule out abdominal aortic aneurysm.  The patient tolerated the  procedure well, and left the laboratory in satisfactory condition.   RESULTS:  The aortic pressure was 183/76 with a mean of 119 and the left  aortic pressure was 183/21.   LEFT MAIN CORONARY ARTERY:  Left main coronary artery has 70% stenosis in  its midportion.   LEFT ANTERIOR DESCENDING ARTERY:  Left anterior descending artery is  completed occluded in its origin.   CIRCUMFLEX ARTERY:  The circumflex artery gave rise to an ramus branch and  then was completely occluded.  The ramus branch had a 70% proximal stenosis.   RIGHT CORONARY ARTERY:  The right coronary artery is __________is completely  occluded in its midportion.   The saphenous vein graft to the posterior descending and posterolateral  branch of the  right coronary artery was patent and functioned normal.   The saphenous vein graft to the marginal branch of circumflex artery was  patent and functioned normally.   The LIMA graft to the LAD was patent and functioned normal.  There was no  significant distal disease in the LAD.  There was 95% stenosis proximally at  the insertion site which compromised 2 small diagonal branches.   LEFT VENTRICULOGRAM:  The left ventriculogram performed in the RAO  projection showed good wall motion with no of hypokinesis. The estimated  ejection fraction was 55%.   DISTAL AORTOGRAM:  A distal aortogram was performed which showed patent  renal arteries and no significant aortoiliac obstruction.   CONCLUSIONS:  1. Coronary artery disease status post redo bypass surgery in 1997.  2. Severe native vessel disease with 70% stenosis of the left main coronary     artery, total occlusion of  the left anterior descending and right     coronary artery, and 70% stenosis in the ramus branch with total     occlusion of the circumflex artery after the ramus branch.  3. Patent sequential vein graft to the posterior descending and     posterolateral branches of the right coronary artery.  4. Patent vein graft to the marginal branch of the circumflex artery.  5. Patent LIMA graft to the LAD.  6. Normal LV function.   RECOMMENDATIONS:  This patient's grafts are all patent.  His only potential  source of ischemia is in 2 small diagonal branches proximal to the insertion  of the LIMA graft.  His outlook should be good from a cardiac standpoint and  I think that his optimal risk for a lumbar spine surgery should not be  greatly increased.                                               Eustace Quail, M.D. [facility]    BB/MEDQ  D:  12/06/2003  T:  12/07/2003  Job:  [id number]   cc:   Darrick Penna. Linna Darner, M.D. [facility]   Eustace Quail, M.D. [facility]   Cardiac Cath Lab   Marchia Meiers. Vertell Limber, M.D.  [address]  [place]  [place] [address]   Fax: [phone]

## 2010-10-23 NOTE — Assessment & Plan Note (Signed)
[facility] OFFICE NOTE   NAME:[name], [name]                      MRN:          [id number]  DATE:09/06/2006                            DOB:          06-25-1920    Leonard [name], date of birth 1921/04/01, was seen September 06, 2006 for  followup of his diabetes.  His most recent A1c was 8.1, up from a value  of 7.8 on May 28, 2006.  In December, his creatinine had been 1.7;  it is now 1.5.  He has no micro albumin in his urine.   He has been on Lantus 25 units; unfortunately his baseline insulin was  not increased as his fasting sugars were over 170 for 2 weeks running.  He is to be on a scale of increasing Lantus by 1 unit if the prior  week's fasting blood sugar averages over 130.  If he is less than 90, he  will decrease by 1 unit.   It has been stressed that the first goal is no hypoglycemia.   The second goal would be an average fasting blood sugar of 90-130.  This  will be appended :if his fasting blood sugar is over 160, he will go up  by 2 units of Lantus the next week.   His AC evening meal glucoses have ranged from 122-200.  He finds that  they are over 200 with hyperglycemic carbs, such as mashed potatoes or  rice.  Carbohydrate options were discussed with him along with serving  size.   He will continue the evening sliding scale 2 units for an AC glucose of  150-200 and 4 units 201-250, and 6 units over 251.   He has had only 1 episode of hypoglycemia ,@ approximately 3 p.m.   His weight is up approximately 4 pounds to 157.  Blood pressure was  130/70, pulse 61 and regular, respiratory rate 16.  He has no change in the carotid bruits, greater on the left than the  right.  He has a grade I systolic murmur.   He and his wife are most motivated and are cognizant of the goals and  the parameters for insulin administration.  He will be reevaluated in 3  months.     Darrick Penna.  Linna Darner, MD,FACP,FCCP  Electronically Signed    [facility]/MedQ  DD: 09/06/2006  DT: 09/06/2006  Job #: [id number]

## 2010-11-12 ENCOUNTER — Other Ambulatory Visit: Payer: Self-pay | Admitting: *Deleted

## 2010-11-12 DIAGNOSIS — E785 Hyperlipidemia, unspecified: Secondary | ICD-10-CM

## 2010-11-12 DIAGNOSIS — T887XXA Unspecified adverse effect of drug or medicament, initial encounter: Secondary | ICD-10-CM

## 2010-11-12 DIAGNOSIS — I10 Essential (primary) hypertension: Secondary | ICD-10-CM

## 2010-11-13 ENCOUNTER — Other Ambulatory Visit (INDEPENDENT_AMBULATORY_CARE_PROVIDER_SITE_OTHER): Payer: Medicare Other

## 2010-11-13 DIAGNOSIS — I10 Essential (primary) hypertension: Secondary | ICD-10-CM

## 2010-11-13 DIAGNOSIS — E785 Hyperlipidemia, unspecified: Secondary | ICD-10-CM

## 2010-11-13 DIAGNOSIS — T887XXA Unspecified adverse effect of drug or medicament, initial encounter: Secondary | ICD-10-CM

## 2010-11-13 LAB — HEPATIC FUNCTION PANEL
ALT: 17 U/L (ref 0–53)
AST: 21 U/L (ref 0–37)
Alkaline Phosphatase: 82 U/L (ref 39–117)
Bilirubin, Direct: 0.1 mg/dL (ref 0.0–0.3)
Total Bilirubin: 0.4 mg/dL (ref 0.3–1.2)
Total Protein: 7.5 g/dL (ref 6.0–8.3)

## 2010-11-13 LAB — LIPID PANEL
LDL Cholesterol: 102 mg/dL — ABNORMAL HIGH (ref 0–99)
Total CHOL/HDL Ratio: 4

## 2010-11-13 LAB — POTASSIUM: Potassium: 4.1 mEq/L (ref 3.5–5.1)

## 2010-11-13 LAB — CREATININE, SERUM: Creatinine, Ser: 1.6 mg/dL — ABNORMAL HIGH (ref 0.4–1.5)

## 2010-11-13 LAB — BUN: BUN: 27 mg/dL — ABNORMAL HIGH (ref 6–23)

## 2010-11-13 NOTE — Progress Notes (Signed)
Labs only

## 2010-11-30 ENCOUNTER — Other Ambulatory Visit: Payer: Self-pay | Admitting: Internal Medicine

## 2010-12-01 NOTE — Telephone Encounter (Signed)
Refill sent.

## 2011-02-03 ENCOUNTER — Telehealth: Payer: Self-pay | Admitting: Internal Medicine

## 2011-02-03 MED ORDER — SIMVASTATIN 40 MG PO TABS: 40.0000 mg | ORAL_TABLET | Freq: Every day | ORAL | Status: DC

## 2011-02-03 NOTE — Telephone Encounter (Signed)
Patient wants 90 day supply of simvastatin - [facility] [address]

## 2011-02-23 NOTE — Telephone Encounter (Signed)
error 

## 2011-03-01 LAB — DIFFERENTIAL
Basophils Absolute: 0
Basophils Relative: 1
Eosinophils Absolute: 0.1
Eosinophils Relative: 2

## 2011-03-01 LAB — URINALYSIS, ROUTINE W REFLEX MICROSCOPIC
Glucose, UA: NEGATIVE
Hgb urine dipstick: NEGATIVE
Urobilinogen, UA: 0.2

## 2011-03-01 LAB — CBC
HCT: 41.7
MCHC: 33.9
MCV: 87
Platelets: 124 — ABNORMAL LOW
RDW: 15.2

## 2011-03-01 LAB — BASIC METABOLIC PANEL
CO2: 26
Calcium: 8.1 — ABNORMAL LOW
Chloride: 98
GFR calc Af Amer: 59 — ABNORMAL LOW
Sodium: 132 — ABNORMAL LOW

## 2011-03-30 ENCOUNTER — Other Ambulatory Visit: Payer: Self-pay | Admitting: Internal Medicine

## 2011-04-05 ENCOUNTER — Telehealth: Payer: Self-pay | Admitting: Internal Medicine

## 2011-04-05 NOTE — Telephone Encounter (Signed)
Patient needs refill for coreg - [facility] [address]

## 2011-04-06 MED ORDER — CARVEDILOL 25 MG PO TABS: 25.0000 mg | ORAL_TABLET | Freq: Every day | ORAL | Status: DC

## 2011-04-06 NOTE — Telephone Encounter (Signed)
Called and spoke with patient's wife to clarify which dose patient is taking. Patient prefers 25 mg tab 90 day supply , wife states patient was given the 12.5mg  BID because the 25 mg was not available but he prefers 25 mg once daily

## 2011-04-08 ENCOUNTER — Telehealth: Payer: Self-pay

## 2011-04-08 MED ORDER — CARVEDILOL 25 MG PO TABS: 25.0000 mg | ORAL_TABLET | Freq: Two times a day (BID) | ORAL | Status: DC

## 2011-04-08 NOTE — Telephone Encounter (Signed)
This medication only has a 12 hour half life; this should  be taken twice a day.The appropriate dose would be 25 mg one pill twice a day. Blood Pressure Goal  Ideally is an AVERAGE < 135/85. This AVERAGE should be calculated from @ least 5-7 BP readings taken @ different times of day on different days of week. You should not respond to isolated BP readings , but rather the AVERAGE for that week

## 2011-04-08 NOTE — Telephone Encounter (Signed)
Wife called for clarification of carvedilol Rx. Pt was previously taking 12.5 mg 2 tabs bid. He now has the 25 mg tabs again and wife would like to know if he should be taking one 25 mg tab qd or bid. Previous phone note states that pt wants to take one 25 mg qd. Please advise

## 2011-04-08 NOTE — Telephone Encounter (Signed)
Discuss with patient, Rx sent to pharmacy. 

## 2011-04-13 NOTE — H&P (Signed)
Reason For Visit  Leonard [name] is an 77 year old male who returns today for completion of his gross hematuria workup with cystoscopy.   History of Present Illness  Adenocarcinoma of the prostate: He was noted to have a PSA of 6.8 in 2/93 and underwent TRUS/BX that revealed BPH only at that time. His PSA was found to have been elevated to 9.5 and a repeat biopsy on 12/03/94 revealed Gleason 5 adenocarcinoma from the left lobe. At that time his prostate measured 79 cc. He underwent XRT and 7-9/96. His PSA fell and has remained stable ever since.  Gross hematuria: He experienced gross, painless hematuria on 03/22/11. His creatinine was found to be slightly elevated at 1.61 and a CT scan done on 03/29/11 revealed the left renal cyst measuring approximately 7 cm and appearing simple but no renal calculi, masses or hydronephrosis noted. The bladder wall was noted to be symmetrically thickened consistent with long-standing outlet obstruction.   Past Medical History Problems  1. History of  Diabetes Mellitus 250.00 2. History of  Hypercholesterolemia 272.0 3. History of  Hypertension 401.9 4. History of  Prostate Cancer V10.46 5. History of  Radiation Therapy V58.0  Surgical History Problems  1. History of  Back Surgery 2. History of  Coronary Artery Surgery  Current Meds 1. AmLODIPine Besylate 10 MG Oral Tablet; Therapy: 05Oct2010 to 2. Aspirin Low Dose 81 MG Oral Tablet; Therapy: (Recorded:16Oct2012) to 3. BD Insulin Syringe Ultrafine 31G X 5/16" 0.5 ML Miscellaneous; Therapy: 12Nov2010 to 4. BD Pen Needle Short U/F 31G X 8 MM Miscellaneous; Therapy: 21Feb2011 to 5. Carvedilol 12.5 MG Oral Tablet; Therapy: [id number] to 6. Fish Oil CAPS; Therapy: (Recorded:12Mar2008) to 7. Gabapentin 300 MG TABS; Therapy: (Recorded:12Mar2008) to 8. HumaLOG KwikPen SOLN; Therapy: (Recorded:20Jan2009) to 9. Januvia 100 MG Oral Tablet; Therapy: (Recorded:25Jul2008) to 10. Lantus 100 UNIT/ML Subcutaneous Solution;  Therapy: (Recorded:12Mar2008) to 11. Losartan Potassium-HCTZ 100-12.5 MG Oral Tablet; Therapy: 03Feb2011 to 12. Lumigan 0.01 % Ophthalmic Solution; Therapy: [id number] to 13. Precision Xtra Blood Glucose In Vitro Strip; Therapy: [id number] to 14. Simvastatin 40 MG Oral Tablet; Therapy: [id number] to  Allergies Medication  1. ACE Inhibitors 2. Codeine Derivatives  Family History Problems  1. Family history of  Family Health Status Number Of Children 2-daughters  Social History Problems  1. Marital History - Currently Married 2. Never A Smoker 3. Occupation: retired Denied  4. Alcohol Use 5. Caffeine Use  Review of Systems Genitourinary and gastrointestinal system(s) were reviewed and pertinent findings if present are noted.  Genitourinary: urinary frequency and hematuria.  Gastrointestinal: no flank pain.    Results/Data  The following images/tracing/specimen were independently visualized:  CT scan as above. Selected Results  AU CT-HEMATURIA PROTOCOL 22Oct2012 12:00AM Freeman Caldron   Test Name Result Flag Reference  ** RADIOLOGY REPORT BY [facility] ** ORIGINAL APPROVED BY: Carron Curie, M.D. ON: 03/29/2011 15:08:22   *RADIOLOGY REPORT*  Clinical Data: Gross hematuria.  CT ABDOMEN AND PELVIS WITHOUT AND WITH CONTRAST  Technique: Multidetector CT imaging of the abdomen and pelvis was performed without contrast material in one or both body regions, followed by contrast material(s) and further sections in one or both body regions.  Contrast: 100 ml Isovue 300  Comparison: 09/09/2003; 03/23/2003  Findings: Interstitial accentuation in the right middle lobe and lingula noted. Mild cardiomegaly is present with along with a small lipoma in the left serratus anterior muscle.  There is some punctate calcifications in the liver. Dense atherosclerotic calcification noted, particularly in the splenic  artery  A mildly septated cystic lesion in the  pancreatic head measures 4.7 x 2.6 cm (back into 1004 this measured 3.8 x 2.3 cm). No definite enhancing nodularity noted. There is some mild dilatation of the dorsal pancreatic duct.  The spleen, adrenal glands, gallbladder, and stomach appear unremarkable.  A large simple appearing left renal cyst measures 7.5 x 6.6 cm, increased in size compared to 01/2004 exam. A small hypodense lesion anteriorly in the right kidney was not readily apparent on the prior exam and currently measures 1.0 x 0.8 cm on image 34 of series 2; this lesion is likely a cyst but technically too small to characterize.  Stable mild fullness of the right collecting system noted without overt right hydroureter. There is abnormal wall thickening in the urinary bladder which could be from mild chronic outlet obstruction or cystitis.  No pathologic retroperitoneal or porta hepatis adenopathy is identified.  Aside from appendicoliths, the appendix appears normal and extends up to the gallbladder.  Aortoiliac atherosclerotic calcification noted.  No pelvic ascites noted. There is a fracture of the right sacral ala, likely with a stress fracture component. I do not observe a definite fracture of the pubic rami.  Avascular necrosis the right femoral head noted anteriorly. Bilateral pars defects at L5 are noted with grade II anterolisthesis of L5 on S1. Posterolateral rod pedicle screw fixation noted at L4-L5-S1.  No collecting system or ureteral filling defect is observed on delayed postcontrast images.  IMPRESSION:  1. Slow growing cystic lesion in the pancreatic head, currently 4.7 x 2.6 cm, with mild internal septation. Differential diagnostic considerations include intraductal papillary mucinous tumor, serous cystadenoma, or possibly prior pseudocyst. MRI would allow for more definitive characterization. Given the slow growing nature of this mass, and the patient's age, valid follow-up might include  follow-up abdominal imaging in 1 year for surveillance; cyst aspiration; or resection. 2. Right sacral alar stress fracture. 3. Avascular necrosis in the right femoral head is new compared to 2005, but likely chronic. 4. Mild cardiomegaly. 5. Mild wall thickening in the urinary bladder diffusely, query cystitis. 6. Enlarged but simple-appearing left renal cyst.   BUN & CREATININE 16Oct2012 03:47PM Bruning, Ashlyn  SPECIMEN TYPE: BLOOD   Test Name Result Flag Reference  CREATININE 1.61 mg/dL H 0.50-1.50   Please note change in reference range(s).   BUN 29 mg/dL H 6-23  Est GFR, African American 43 mL/min L >90    Est GFR, NonAfrican American 38 mL/min L >90     Procedure  Procedure: Cystoscopy  Indication: Hematuria.  Informed Consent: Risks, benefits, and potential adverse events were discussed and informed consent was obtained from the patient.  Prep: The patient was prepped with betadine.  Procedure Note:  Urethral meatus:. No abnormalities.  Anterior urethra: No abnormalities.  Prostatic urethra:. The lateral prostatic lobes were enlarged.  Bladder: Visulization was clear. The ureteral orifices were in the normal anatomic position bilaterally and had clear efflux of urine. A solitary tumor was visualized in the bladder. A papillary tumor was seen in the bladder. This tumor was located on the left side, on the posterior aspect of the bladder. The patient tolerated the procedure well.  Complications: None.    Assessment Assessed  1. 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 Gross Hematuria 599.71 3. Simple Renal Cyst In The Left Kidney 593.2 4. Renal Insufficiency 593.9 5. History of  Prostate Cancer V10.46   I went over his CT scan results with him today. He had  no upper tract lesions of concern. In addition cystoscopically I found a bladder tumor on the posterior wall of his bladder on the left-hand side. It appeared papillary and hopefully will be superficial.  I therefore have discussed the need for transurethral resection of his bladder tumor. I went over the procedure with him in detail including the risks and complications, alternatives and the probability of success. He understands and has elected to proceed.   Plan Health Maintenance (V70.0)  1. UA With REFLEX  Done: 31Oct2012 Transitional Cell Carcinoma Of The Bladder (188.9)  2. Follow-up Schedule Surgery Office  Follow-up  Requested for: 31Oct2012   He will be scheduled for an outpatient transurethral resection of his bladder tumor.   Physical exam: Generally the patient is a well-developed well-nourished elderly male in no apparent distress. HEENT: Atraumatic normocephalic Oropharynx is clear neck is supple with midline trachea. Chest:Normal respiratory effort Cardiovascular: Regular rate and rhythm. Abdomen: Flat, soft and nontender without mass or HSM. GU reveals normal male phallus with a normal glans meatus. Scrotum and testicles are normal. Extremities are without clubbing cyanosis or edema. His skin is warm and dry with no obvious lesions. Neurologically there no gross focal neurologic deficits.

## 2011-04-14 ENCOUNTER — Encounter (HOSPITAL_BASED_OUTPATIENT_CLINIC_OR_DEPARTMENT_OTHER): Payer: Self-pay | Admitting: *Deleted

## 2011-04-14 NOTE — Progress Notes (Signed)
NPO after MN. Pt to arrive at [facility]. Needs istat and ekg. Will take norvase, coreg, and neurontin am of surg. W/ sip water. Pt HOH.

## 2011-04-16 ENCOUNTER — Other Ambulatory Visit: Payer: Self-pay | Admitting: Urology

## 2011-04-16 ENCOUNTER — Ambulatory Visit (HOSPITAL_BASED_OUTPATIENT_CLINIC_OR_DEPARTMENT_OTHER): Payer: Medicare Other | Admitting: Anesthesiology

## 2011-04-16 ENCOUNTER — Other Ambulatory Visit: Payer: Self-pay

## 2011-04-16 ENCOUNTER — Encounter (HOSPITAL_BASED_OUTPATIENT_CLINIC_OR_DEPARTMENT_OTHER): Payer: Self-pay | Admitting: Anesthesiology

## 2011-04-16 ENCOUNTER — Encounter (HOSPITAL_BASED_OUTPATIENT_CLINIC_OR_DEPARTMENT_OTHER)
Admission: RE | Disposition: A | Discharge status: Home / Self Care | Payer: Self-pay | Source: Ambulatory Visit | Attending: Urology

## 2011-04-16 ENCOUNTER — Ambulatory Visit (HOSPITAL_BASED_OUTPATIENT_CLINIC_OR_DEPARTMENT_OTHER)
Admission: RE | Admit: 2011-04-16 | Discharge: 2011-04-16 | Disposition: A | Discharge status: Home / Self Care | Payer: Medicare Other | Source: Ambulatory Visit | Attending: Urology | Admitting: Urology

## 2011-04-16 DIAGNOSIS — Z79899 Other long term (current) drug therapy: Secondary | ICD-10-CM | POA: Insufficient documentation

## 2011-04-16 DIAGNOSIS — Z8546 Personal history of malignant neoplasm of prostate: Secondary | ICD-10-CM | POA: Insufficient documentation

## 2011-04-16 DIAGNOSIS — Z7982 Long term (current) use of aspirin: Secondary | ICD-10-CM | POA: Insufficient documentation

## 2011-04-16 DIAGNOSIS — Z794 Long term (current) use of insulin: Secondary | ICD-10-CM | POA: Insufficient documentation

## 2011-04-16 DIAGNOSIS — C679 Malignant neoplasm of bladder, unspecified: Secondary | ICD-10-CM | POA: Insufficient documentation

## 2011-04-16 DIAGNOSIS — I10 Essential (primary) hypertension: Secondary | ICD-10-CM | POA: Insufficient documentation

## 2011-04-16 DIAGNOSIS — E7800 Pure hypercholesterolemia, unspecified: Secondary | ICD-10-CM | POA: Insufficient documentation

## 2011-04-16 DIAGNOSIS — E119 Type 2 diabetes mellitus without complications: Secondary | ICD-10-CM | POA: Insufficient documentation

## 2011-04-16 HISTORY — DX: Neoplasm of unspecified behavior of bladder: D49.4

## 2011-04-16 HISTORY — DX: Disorder of kidney and ureter, unspecified: N28.9

## 2011-04-16 HISTORY — DX: Personal history of other diseases of the digestive system: Z87.19

## 2011-04-16 HISTORY — DX: Essential (primary) hypertension: I10

## 2011-04-16 HISTORY — DX: Occlusion and stenosis of bilateral carotid arteries: I65.23

## 2011-04-16 HISTORY — DX: Type 2 diabetes mellitus with diabetic polyneuropathy: E11.42

## 2011-04-16 HISTORY — DX: Atherosclerotic heart disease of native coronary artery without angina pectoris: I25.10

## 2011-04-16 HISTORY — DX: Other intervertebral disc degeneration, lumbar region without mention of lumbar back pain or lower extremity pain: M51.369

## 2011-04-16 HISTORY — DX: Other intervertebral disc degeneration, lumbar region: M51.36

## 2011-04-16 HISTORY — DX: Nocturia: R35.1

## 2011-04-16 HISTORY — PX: TRANSURETHRAL RESECTION OF BLADDER TUMOR: SHX2575

## 2011-04-16 HISTORY — DX: Malignant neoplasm of prostate: C61

## 2011-04-16 HISTORY — DX: Unspecified hearing loss, unspecified ear: H91.90

## 2011-04-16 LAB — POCT I-STAT 4, (NA,K, GLUC, HGB,HCT)
Glucose, Bld: 101 mg/dL — ABNORMAL HIGH (ref 70–99)
HCT: 45 % (ref 39.0–52.0)
Hemoglobin: 15.3 g/dL (ref 13.0–17.0)
Potassium: 3.5 mEq/L (ref 3.5–5.1)
Sodium: 141 mEq/L (ref 135–145)

## 2011-04-16 LAB — GLUCOSE, CAPILLARY: Glucose-Capillary: 98 mg/dL (ref 70–99)

## 2011-04-16 SURGERY — TURBT (TRANSURETHRAL RESECTION OF BLADDER TUMOR)
Anesthesia: General | Site: Bladder | Wound class: Clean Contaminated

## 2011-04-16 MED ORDER — LACTATED RINGERS IV SOLN
INTRAVENOUS | Status: DC
  Administered 2011-04-16 (×2): via INTRAVENOUS

## 2011-04-16 MED ORDER — GLYCOPYRROLATE 0.2 MG/ML IJ SOLN
INTRAMUSCULAR | Status: DC | PRN
  Administered 2011-04-16: 0.2 mg via INTRAVENOUS

## 2011-04-16 MED ORDER — SODIUM CHLORIDE 0.9 % IR SOLN
Status: DC | PRN
  Administered 2011-04-16: 6000 mL

## 2011-04-16 MED ORDER — LIDOCAINE HCL (CARDIAC) 20 MG/ML IV SOLN
INTRAVENOUS | Status: DC | PRN
  Administered 2011-04-16: 60 mg via INTRAVENOUS

## 2011-04-16 MED ORDER — EPHEDRINE SULFATE 50 MG/ML IJ SOLN
INTRAMUSCULAR | Status: DC | PRN
  Administered 2011-04-16: 10 mg via INTRAVENOUS

## 2011-04-16 MED ORDER — CIPROFLOXACIN IN D5W 200 MG/100ML IV SOLN
200.0000 mg | Freq: Once | INTRAVENOUS | Status: AC
  Administered 2011-04-16: 200 mg via INTRAVENOUS

## 2011-04-16 MED ORDER — FENTANYL CITRATE 0.05 MG/ML IJ SOLN
INTRAMUSCULAR | Status: DC | PRN
  Administered 2011-04-16: 50 ug via INTRAVENOUS

## 2011-04-16 MED ORDER — FENTANYL CITRATE 0.05 MG/ML IJ SOLN: 25.0000 ug | INTRAMUSCULAR | Status: DC | PRN

## 2011-04-16 MED ORDER — HYDROCODONE-ACETAMINOPHEN 10-300 MG PO TABS: 1.0000 | ORAL_TABLET | Freq: Four times a day (QID) | ORAL | Status: DC | PRN

## 2011-04-16 MED ORDER — MITOMYCIN CHEMO FOR BLADDER INSTILLATION 40 MG
40.0000 mg | Freq: Once | INTRAVENOUS | Status: AC
  Administered 2011-04-16: 40 mg via INTRAVESICAL
  Filled 2011-04-16: qty 40

## 2011-04-16 MED ORDER — PHENAZOPYRIDINE HCL 200 MG PO TABS: 200.0000 mg | ORAL_TABLET | Freq: Three times a day (TID) | ORAL | Status: AC | PRN

## 2011-04-16 MED ORDER — PROPOFOL 10 MG/ML IV EMUL
INTRAVENOUS | Status: DC | PRN
  Administered 2011-04-16: 180 mg via INTRAVENOUS

## 2011-04-16 MED ORDER — PROMETHAZINE HCL 25 MG/ML IJ SOLN: 6.2500 mg | INTRAMUSCULAR | Status: DC | PRN

## 2011-04-16 MED ORDER — MITOMYCIN CHEMO FOR BLADDER INSTILLATION 40 MG: INTRAVENOUS | Status: DC | PRN

## 2011-04-16 SURGICAL SUPPLY — 36 items
BAG DRAIN URO-CYSTO SKYTR STRL (DRAIN) ×2 IMPLANT
BAG DRN ANRFLXCHMBR STRAP LEK (BAG)
BAG DRN UROCATH (DRAIN) ×1
BAG URINE DRAINAGE (UROLOGICAL SUPPLIES) ×1 IMPLANT
BAG URINE LEG 19OZ MD ST LTX (BAG) IMPLANT
CANISTER SUCT LVC 12 LTR MEDI- (MISCELLANEOUS) ×2 IMPLANT
CATH FOLEY 2WAY SLVR  5CC 20FR (CATHETERS) ×1
CATH FOLEY 2WAY SLVR  5CC 22FR (CATHETERS)
CATH FOLEY 2WAY SLVR  5CC 24FR (CATHETERS) ×1
CATH FOLEY 2WAY SLVR 5CC 20FR (CATHETERS) IMPLANT
CATH FOLEY 2WAY SLVR 5CC 22FR (CATHETERS) IMPLANT
CATH FOLEY 2WAY SLVR 5CC 24FR (CATHETERS) ×1 IMPLANT
CLOTH BEACON ORANGE TIMEOUT ST (SAFETY) ×2 IMPLANT
DRAPE CAMERA CLOSED 9X96 (DRAPES) ×2 IMPLANT
ELECT BUTTON BIOP 24F 90D PLAS (MISCELLANEOUS) IMPLANT
ELECT LOOP HF 26F 30D .35MM (CUTTING LOOP) IMPLANT
ELECT LOOP MED HF 24F 12D CBL (CLIP) ×1 IMPLANT
ELECT REM PT RETURN 9FT ADLT (ELECTROSURGICAL) ×2
ELECTRODE REM PT RTRN 9FT ADLT (ELECTROSURGICAL) ×1 IMPLANT
EVACUATOR MICROVAS BLADDER (UROLOGICAL SUPPLIES) ×1 IMPLANT
GLOVE BIO SURGEON STRL SZ 6.5 (GLOVE) ×1 IMPLANT
GLOVE BIO SURGEON STRL SZ8 (GLOVE) ×2 IMPLANT
GLOVE ECLIPSE 6.0 STRL STRAW (GLOVE) ×1 IMPLANT
GOWN BRE IMP SLV AUR LG STRL (GOWN DISPOSABLE) ×1 IMPLANT
GOWN BRE IMP SLV AUR XL STRL (GOWN DISPOSABLE) ×1 IMPLANT
GOWN PREVENTION PLUS LG XLONG (DISPOSABLE) ×2 IMPLANT
GOWN STRL REIN XL XLG (GOWN DISPOSABLE) ×2 IMPLANT
HOLDER FOLEY CATH W/STRAP (MISCELLANEOUS) ×1 IMPLANT
IV NS IRRIG 3000ML ARTHROMATIC (IV SOLUTION) ×2 IMPLANT
KIT ASPIRATION TUBING (SET/KITS/TRAYS/PACK) ×1 IMPLANT
LOOP CUTTING 24FR OLYMPUS (CUTTING LOOP) IMPLANT
LOOP ELECTRODE 28FR (MISCELLANEOUS) IMPLANT
NS IRRIG 500ML POUR BTL (IV SOLUTION) ×1 IMPLANT
PACK CYSTOSCOPY (CUSTOM PROCEDURE TRAY) ×2 IMPLANT
PLUG CATH AND CAP STER (CATHETERS) ×1 IMPLANT
WATER STERILE IRR 3000ML UROMA (IV SOLUTION) IMPLANT

## 2011-04-16 NOTE — Anesthesia Postprocedure Evaluation (Signed)
  Anesthesia Post-op Note  Patient: Leonard [name]  Procedure(s) Performed:  TRANSURETHRAL RESECTION OF BLADDER TUMOR (TURBT)  Patient Location: PACU  Anesthesia Type: General  Level of Consciousness: awake, alert  and oriented  Airway and Oxygen Therapy: Patient Spontanous Breathing  Post-op Pain: mild  Post-op Assessment: Post-op Vital signs reviewed, Patient's Cardiovascular Status Stable and Respiratory Function Stable  Post-op Vital Signs: stable  Complications: No apparent anesthesia complications

## 2011-04-16 NOTE — Anesthesia Preprocedure Evaluation (Addendum)
Anesthesia Evaluation  Patient identified by MRN, date of birth, ID band Patient awake    Airway Mallampati: III TM Distance: <3 FB     Dental  (+) Partial Upper, Poor Dentition and Dental Advisory Given,    Pulmonary neg pulmonary ROS,  clear to auscultation  Pulmonary exam normal       Cardiovascular Exercise Tolerance: Good hypertension, - angina+ CAD and + CABG - Past MI Regular Normal+ Systolic murmurs    Neuro/Psych  Headaches,  Neuromuscular disease Negative Psych ROS   GI/Hepatic negative GI ROS, (+) Hepatitis -, UnspecifiedJaundice in 1970's   Endo/Other  Diabetes mellitus-, Poorly Controlled, Type 1, Insulin Dependent  Renal/GU Renal InsufficiencyRenal disease   TCCA prostate    Musculoskeletal  (+) Arthritis -,   Abdominal Normal abdominal exam  (+)   Peds negative pediatric ROS (+)  Hematology negative hematology ROS (+)   Anesthesia Other Findings   Reproductive/Obstetrics negative OB ROS                          Anesthesia Physical Anesthesia Plan  ASA: III  Anesthesia Plan: General   Post-op Pain Management:    Induction: Intravenous  Airway Management Planned: LMA  Additional Equipment:   Intra-op Plan:   Post-operative Plan:   Informed Consent: I have reviewed the patients History and Physical, chart, labs and discussed the procedure including the risks, benefits and alternatives for the proposed anesthesia with the patient or authorized representative who has indicated his/her understanding and acceptance.   Dental advisory given  Plan Discussed with: CRNA and Surgeon  Anesthesia Plan Comments:         Anesthesia Quick Evaluation

## 2011-04-16 NOTE — Addendum Note (Signed)
Addendum  created 04/16/11 1249 by Bethena Roys   Modules edited:Orders

## 2011-04-16 NOTE — Op Note (Signed)
PATIENT:  Leonard [name]  PRE-OPERATIVE DIAGNOSIS:  Bladder tumor  POST-OPERATIVE DIAGNOSIS:  Same  PROCEDURE:  Procedure(s): TRANSURETHRAL RESECTION OF BLADDER TUMOR (TURBT) (0.5cm.)  SURGEON:  Surgeon(s): Claybon Jabs  ANESTHESIA:   general  EBL:  Minimal.  DRAINS: Urinary Catheter (20 Fr. Foley)   SPECIMEN:  Source of Specimen:  Bladder tumor  DISPOSITION OF SPECIMEN:  PATHOLOGY  DICTATION: The patient was taken to the operating room and administered general anesthesia. He was then placed on the table and moved to the dorsal lithotomy position after which his genitalia was sterilely prepped and draped. An official timeout was then performed.  The 26 French resectoscope with Timberlake obturator was then introduced into the bladder and the obturator was removed. The resectoscope element with 12 lens was then inserted and the bladder was fully and systematically inspected. Ureteral orifices were noted to be in the normal anatomic positions.   I first began by resecting the single tumor located on the left lateral wall. Reinspection of the bladder revealed all obvious tumor had been fully resected and there was no evidence of perforation. The Microvasive evacuator was then used to irrigate the bladder and remove all of the portions of bladder tumor which were sent to pathology. I then resected deeper into the region of the previous tumor in order to obtain a specimen from the bladder tumor base. The specimen was then removed and I then removed the resectoscope.  A 20 French Foley catheter was then inserted in the bladder and irrigated. The irrigant returned slightly pink with no clots. I then instilled 40 mg of mitomycin-C in 40 cc of water and plugged the catheter. This will remain indwelling for approximately one hour. It will then be drained from the bladder and the catheter will be removed and the patient discharged home.  PLAN OF CARE: Discharge to home after PACU  PATIENT  DISPOSITION:  PACU - hemodynamically stable.

## 2011-04-16 NOTE — Addendum Note (Signed)
Addendum  created 04/16/11 1252 by Bethena Roys   Modules edited:Orders

## 2011-04-16 NOTE — Progress Notes (Signed)
Reported to M. Zenia Resides, RN as caregiver

## 2011-04-16 NOTE — Anesthesia Procedure Notes (Addendum)
Procedure Name: LMA Insertion Date/Time: 04/16/2011 9:29 AM Performed by: Edwyna Perfect Pre-anesthesia Checklist: Patient identified, Emergency Drugs available, Suction available and Patient being monitored Patient Re-evaluated:Patient Re-evaluated prior to inductionOxygen Delivery Method: Circle System Utilized Preoxygenation: Pre-oxygenation with 100% oxygen Intubation Type: IV induction Ventilation: Mask ventilation without difficulty LMA: LMA with gastric port inserted LMA Size: 4.0 Number of attempts: 1 Placement Confirmation: positive ETCO2 and breath sounds checked- equal and bilateral Tube secured with: Tape Dental Injury: Teeth and Oropharynx as per pre-operative assessment

## 2011-04-16 NOTE — Transfer of Care (Signed)
Immediate Anesthesia Transfer of Care Note  Patient: Leonard [name]  Procedure(s) Performed:  TRANSURETHRAL RESECTION OF BLADDER TUMOR (TURBT)  Patient Location: PACU  Anesthesia Type: General  Level of Consciousness: awake and alert   Airway & Oxygen Therapy: Patient Spontanous Breathing and Patient connected to face mask oxygen  Post-op Assessment: Report given to PACU RN and Post -op Vital signs reviewed and stable  Post vital signs: Reviewed and stable  Complications: No apparent anesthesia complications

## 2011-04-16 NOTE — H&P (Signed)
   History reviewed, patient examined, no change in status, stable for surgery.  

## 2011-04-21 ENCOUNTER — Encounter (HOSPITAL_BASED_OUTPATIENT_CLINIC_OR_DEPARTMENT_OTHER): Payer: Self-pay | Admitting: Urology

## 2011-04-28 ENCOUNTER — Encounter (HOSPITAL_COMMUNITY): Payer: Self-pay | Admitting: Emergency Medicine

## 2011-04-28 ENCOUNTER — Emergency Department (HOSPITAL_COMMUNITY): Payer: Medicare Other

## 2011-04-28 ENCOUNTER — Other Ambulatory Visit: Payer: Self-pay

## 2011-04-28 ENCOUNTER — Emergency Department (HOSPITAL_COMMUNITY)
Admission: EM | Admit: 2011-04-28 | Discharge: 2011-04-28 | Disposition: A | Discharge status: Home / Self Care | Payer: Medicare Other | Attending: Emergency Medicine | Admitting: Emergency Medicine

## 2011-04-28 DIAGNOSIS — R5381 Other malaise: Secondary | ICD-10-CM | POA: Insufficient documentation

## 2011-04-28 DIAGNOSIS — J438 Other emphysema: Secondary | ICD-10-CM | POA: Insufficient documentation

## 2011-04-28 DIAGNOSIS — E162 Hypoglycemia, unspecified: Secondary | ICD-10-CM

## 2011-04-28 DIAGNOSIS — Z981 Arthrodesis status: Secondary | ICD-10-CM | POA: Insufficient documentation

## 2011-04-28 DIAGNOSIS — Z951 Presence of aortocoronary bypass graft: Secondary | ICD-10-CM | POA: Insufficient documentation

## 2011-04-28 DIAGNOSIS — IMO0002 Reserved for concepts with insufficient information to code with codable children: Secondary | ICD-10-CM | POA: Insufficient documentation

## 2011-04-28 DIAGNOSIS — Z8546 Personal history of malignant neoplasm of prostate: Secondary | ICD-10-CM | POA: Insufficient documentation

## 2011-04-28 DIAGNOSIS — S81009A Unspecified open wound, unspecified knee, initial encounter: Secondary | ICD-10-CM | POA: Insufficient documentation

## 2011-04-28 DIAGNOSIS — E1169 Type 2 diabetes mellitus with other specified complication: Secondary | ICD-10-CM | POA: Insufficient documentation

## 2011-04-28 DIAGNOSIS — I10 Essential (primary) hypertension: Secondary | ICD-10-CM | POA: Insufficient documentation

## 2011-04-28 DIAGNOSIS — S60559A Superficial foreign body of unspecified hand, initial encounter: Secondary | ICD-10-CM | POA: Insufficient documentation

## 2011-04-28 DIAGNOSIS — I2510 Atherosclerotic heart disease of native coronary artery without angina pectoris: Secondary | ICD-10-CM | POA: Insufficient documentation

## 2011-04-28 DIAGNOSIS — K449 Diaphragmatic hernia without obstruction or gangrene: Secondary | ICD-10-CM | POA: Insufficient documentation

## 2011-04-28 DIAGNOSIS — R5383 Other fatigue: Secondary | ICD-10-CM | POA: Insufficient documentation

## 2011-04-28 DIAGNOSIS — G319 Degenerative disease of nervous system, unspecified: Secondary | ICD-10-CM | POA: Insufficient documentation

## 2011-04-28 LAB — COMPREHENSIVE METABOLIC PANEL
ALT: 11 U/L (ref 0–53)
AST: 18 U/L (ref 0–37)
Albumin: 3.6 g/dL (ref 3.5–5.2)
CO2: 26 mEq/L (ref 19–32)
Chloride: 98 mEq/L (ref 96–112)
Creatinine, Ser: 1.41 mg/dL — ABNORMAL HIGH (ref 0.50–1.35)
GFR calc non Af Amer: 43 mL/min — ABNORMAL LOW (ref >90)
Sodium: 135 mEq/L (ref 135–145)
Total Bilirubin: 0.4 mg/dL (ref 0.3–1.2)

## 2011-04-28 LAB — CBC
Hemoglobin: 13 g/dL (ref 13.0–17.0)
MCH: 28 pg (ref 26.0–34.0)
MCHC: 32.6 g/dL (ref 30.0–36.0)
MCV: 85.8 fL (ref 78.0–100.0)
Platelets: 177 10*3/uL (ref 150–400)
RBC: 4.65 MIL/uL (ref 4.22–5.81)

## 2011-04-28 LAB — URINALYSIS, MICROSCOPIC ONLY
Bilirubin Urine: NEGATIVE
Glucose, UA: NEGATIVE mg/dL
Ketones, ur: NEGATIVE mg/dL
Protein, ur: NEGATIVE mg/dL
pH: 7.5 (ref 5.0–8.0)

## 2011-04-28 LAB — POCT I-STAT, CHEM 8
BUN: 31 mg/dL — ABNORMAL HIGH (ref 6–23)
Calcium, Ion: 1.07 mmol/L — ABNORMAL LOW (ref 1.12–1.32)
Creatinine, Ser: 1.5 mg/dL — ABNORMAL HIGH (ref 0.50–1.35)
Glucose, Bld: 83 mg/dL (ref 70–99)
TCO2: 26 mmol/L (ref 0–100)

## 2011-04-28 LAB — LACTIC ACID, PLASMA: Lactic Acid, Venous: 0.9 mmol/L (ref 0.5–2.2)

## 2011-04-28 LAB — GLUCOSE, CAPILLARY

## 2011-04-28 LAB — SAMPLE TO BLOOD BANK

## 2011-04-28 LAB — PROTIME-INR
INR: 1.05 (ref 0.00–1.49)
Prothrombin Time: 13.9 seconds (ref 11.6–15.2)

## 2011-04-28 MED ORDER — IOHEXOL 300 MG/ML  SOLN
100.0000 mL | Freq: Once | INTRAMUSCULAR | Status: AC | PRN
  Administered 2011-04-28: 100 mL via INTRAVENOUS

## 2011-04-28 MED ORDER — DEXTROSE-NACL 5-0.45 % IV SOLN: INTRAVENOUS | Status: DC

## 2011-04-28 NOTE — ED Notes (Signed)
Discharge instructions reviewed with pt and family.  Verbalizes understanding.  Right hand cleaned and dressed.  Tolerated well.  Pt to lobby via wheelchair.  NAD noted; VSS.

## 2011-04-28 NOTE — Progress Notes (Signed)
CRITICAL CARE Performed by: Delora Fuel   Total critical care time: 40 minutes  Critical care time was exclusive of separately billable procedures and treating other patients.  Critical care was necessary to treat or prevent imminent or life-threatening deterioration.  Critical care was time spent personally by me on the following activities: development of treatment plan with patient and/or surrogate as well as nursing, discussions with consultants, evaluation of patient's response to treatment, examination of patient, obtaining history from patient or surrogate, ordering and performing treatments and interventions, ordering and review of laboratory studies, ordering and review of radiographic studies, pulse oximetry and re-evaluation of patient's condition.
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
## 2011-04-28 NOTE — ED Notes (Signed)
Pt to [facility] from CT.  NAD noted.  CBG @ 94.  VSS.
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
## 2011-04-29 NOTE — ED Provider Notes (Signed)
I saw and evaluated the patient, reviewed the resident's note and I agree with the findings and plan. I have reviewed the resident's reading of the ECG and agree with the exception that there are Q waves present in the anterior leads.  Delora Fuel, MD [id number]
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
## 2011-05-11 ENCOUNTER — Encounter: Payer: Self-pay | Admitting: Internal Medicine

## 2011-05-11 ENCOUNTER — Ambulatory Visit (INDEPENDENT_AMBULATORY_CARE_PROVIDER_SITE_OTHER): Payer: Medicare Other | Admitting: Internal Medicine

## 2011-05-11 VITALS — BP 130/72 | HR 72 | Temp 98.3°F | Wt 152.0 lb

## 2011-05-11 DIAGNOSIS — J209 Acute bronchitis, unspecified: Secondary | ICD-10-CM

## 2011-05-11 MED ORDER — AZITHROMYCIN 250 MG PO TABS: ORAL_TABLET | ORAL | Status: AC

## 2011-05-11 MED ORDER — BENZONATATE 100 MG PO CAPS: 100.0000 mg | ORAL_CAPSULE | Freq: Four times a day (QID) | ORAL | Status: DC | PRN

## 2011-05-11 MED ORDER — NITROGLYCERIN 0.4 MG SL SUBL: 0.4000 mg | SUBLINGUAL_TABLET | SUBLINGUAL | Status: DC | PRN

## 2011-05-11 NOTE — Progress Notes (Signed)
  Subjective:    Patient ID: Leonard [name], male    DOB: 07/07/1920, 77 y.o.   MRN: [id number]  HPI Cough Onset:11/22 Extrinsic symptoms:no itchy eyes, sneezing  Infectious symptoms : no fever, purulent secretions Chest symptoms:some  pleuritic pain over 12/1-2,resolving without Rx. No sputum production, hemoptysis,dyspnea. Some wheezing GI symptoms: no dyspepsia, reflux Occupational/environmental exposures:mold in basement Smoking:never ACE inhibitor:it was D/Ced due to cough Treatment/efficacy:Robitussin w/o benefit Past medical history/family history pulmonary disease: no    Review of Systems     Objective:   Physical Exam General appearance is of good health and nourishment; no acute distress or increased work of breathing is present.  No  lymphadenopathy about the head, neck, or axilla noted.Appears younger than age   Eyes: No conjunctival inflammation or lid edema is present.   Ears:  External ear exam shows no significant lesions or deformities.  Otoscopic examination reveals clear canals, tympanic membranes are intact bilaterally without bulging, retraction, inflammation or discharge.  Nose:  External nasal examination shows no deformity or inflammation. Nasal mucosa are pink and moist without lesions or exudates. No septal dislocation or dislocation.No obstruction to airflow.   Oral exam: Upper plate; lips and gums are healthy appearing.There is no oropharyngeal erythema or exudate noted.     Heart:  Normal rate and regular rhythm. S1 and S2 normal without gallop, murmur, click, rub .S 4 with slurring Lungs:Chest clear to auscultation; no wheezes, rhonchi,rales ,or rubs present.No increased work of breathing.    Extremities:  No cyanosis, edema, or clubbing  noted . Amputations L hand   Skin: Warm & dry           Assessment & Plan:  #1 bronchitis with cough, probable atypical bacterial etiology. Subjective history of wheezing; no wheezing on exam at this  time. Plan: See orders and recommendations

## 2011-05-11 NOTE — Patient Instructions (Signed)
Plain Mucinex for thick secretions ;force NON dairy fluids .

## 2011-06-06 ENCOUNTER — Emergency Department (HOSPITAL_COMMUNITY): Payer: Medicare Other

## 2011-06-06 ENCOUNTER — Other Ambulatory Visit: Payer: Self-pay

## 2011-06-06 ENCOUNTER — Encounter (HOSPITAL_COMMUNITY): Payer: Self-pay | Admitting: Internal Medicine

## 2011-06-06 ENCOUNTER — Inpatient Hospital Stay (HOSPITAL_COMMUNITY)
Admission: EM | Admit: 2011-06-06 | Discharge: 2011-06-08 | DRG: 312 | Disposition: A | Discharge status: Home / Self Care | Payer: Medicare Other | Attending: Internal Medicine | Admitting: Internal Medicine

## 2011-06-06 DIAGNOSIS — B354 Tinea corporis: Secondary | ICD-10-CM

## 2011-06-06 DIAGNOSIS — M25559 Pain in unspecified hip: Secondary | ICD-10-CM

## 2011-06-06 DIAGNOSIS — I440 Atrioventricular block, first degree: Secondary | ICD-10-CM | POA: Diagnosis present

## 2011-06-06 DIAGNOSIS — Z7982 Long term (current) use of aspirin: Secondary | ICD-10-CM

## 2011-06-06 DIAGNOSIS — I10 Essential (primary) hypertension: Secondary | ICD-10-CM | POA: Diagnosis present

## 2011-06-06 DIAGNOSIS — R55 Syncope and collapse: Secondary | ICD-10-CM

## 2011-06-06 DIAGNOSIS — M5430 Sciatica, unspecified side: Secondary | ICD-10-CM

## 2011-06-06 DIAGNOSIS — R238 Other skin changes: Secondary | ICD-10-CM

## 2011-06-06 DIAGNOSIS — Z951 Presence of aortocoronary bypass graft: Secondary | ICD-10-CM

## 2011-06-06 DIAGNOSIS — E162 Hypoglycemia, unspecified: Secondary | ICD-10-CM

## 2011-06-06 DIAGNOSIS — Q762 Congenital spondylolisthesis: Secondary | ICD-10-CM

## 2011-06-06 DIAGNOSIS — IMO0002 Reserved for concepts with insufficient information to code with codable children: Secondary | ICD-10-CM | POA: Diagnosis present

## 2011-06-06 DIAGNOSIS — Z794 Long term (current) use of insulin: Secondary | ICD-10-CM

## 2011-06-06 DIAGNOSIS — T887XXA Unspecified adverse effect of drug or medicament, initial encounter: Secondary | ICD-10-CM | POA: Insufficient documentation

## 2011-06-06 DIAGNOSIS — I2510 Atherosclerotic heart disease of native coronary artery without angina pectoris: Secondary | ICD-10-CM | POA: Diagnosis present

## 2011-06-06 DIAGNOSIS — R1011 Right upper quadrant pain: Secondary | ICD-10-CM

## 2011-06-06 DIAGNOSIS — R001 Bradycardia, unspecified: Secondary | ICD-10-CM | POA: Diagnosis present

## 2011-06-06 DIAGNOSIS — R51 Headache: Secondary | ICD-10-CM

## 2011-06-06 DIAGNOSIS — K5289 Other specified noninfective gastroenteritis and colitis: Secondary | ICD-10-CM

## 2011-06-06 DIAGNOSIS — N179 Acute kidney failure, unspecified: Secondary | ICD-10-CM | POA: Diagnosis present

## 2011-06-06 DIAGNOSIS — IMO0001 Reserved for inherently not codable concepts without codable children: Secondary | ICD-10-CM

## 2011-06-06 DIAGNOSIS — R0989 Other specified symptoms and signs involving the circulatory and respiratory systems: Secondary | ICD-10-CM

## 2011-06-06 DIAGNOSIS — J209 Acute bronchitis, unspecified: Secondary | ICD-10-CM

## 2011-06-06 DIAGNOSIS — E1149 Type 2 diabetes mellitus with other diabetic neurological complication: Secondary | ICD-10-CM | POA: Diagnosis present

## 2011-06-06 DIAGNOSIS — E1142 Type 2 diabetes mellitus with diabetic polyneuropathy: Secondary | ICD-10-CM | POA: Diagnosis present

## 2011-06-06 DIAGNOSIS — E785 Hyperlipidemia, unspecified: Secondary | ICD-10-CM

## 2011-06-06 DIAGNOSIS — I498 Other specified cardiac arrhythmias: Secondary | ICD-10-CM | POA: Diagnosis present

## 2011-06-06 DIAGNOSIS — Z8679 Personal history of other diseases of the circulatory system: Secondary | ICD-10-CM

## 2011-06-06 DIAGNOSIS — Z8546 Personal history of malignant neoplasm of prostate: Secondary | ICD-10-CM

## 2011-06-06 DIAGNOSIS — E119 Type 2 diabetes mellitus without complications: Secondary | ICD-10-CM

## 2011-06-06 DIAGNOSIS — R42 Dizziness and giddiness: Secondary | ICD-10-CM

## 2011-06-06 DIAGNOSIS — M810 Age-related osteoporosis without current pathological fracture: Secondary | ICD-10-CM

## 2011-06-06 DIAGNOSIS — E1129 Type 2 diabetes mellitus with other diabetic kidney complication: Secondary | ICD-10-CM | POA: Diagnosis present

## 2011-06-06 HISTORY — DX: Syncope and collapse: R55

## 2011-06-06 LAB — CARDIAC PANEL(CRET KIN+CKTOT+MB+TROPI)
CK, MB: 3.5 ng/mL (ref 0.3–4.0)
CK, MB: 3.5 ng/mL (ref 0.3–4.0)
Relative Index: 2.1 (ref 0.0–2.5)
Total CK: 167 U/L (ref 7–232)
Troponin I: <0.3 ng/mL (ref <0.30)

## 2011-06-06 LAB — CBC
MCH: 29 pg (ref 26.0–34.0)
MCHC: 34.2 g/dL (ref 30.0–36.0)
MCV: 84.8 fL (ref 78.0–100.0)
Platelets: 120 10*3/uL — ABNORMAL LOW (ref 150–400)
RDW: 14.7 % (ref 11.5–15.5)
WBC: 4.4 10*3/uL (ref 4.0–10.5)

## 2011-06-06 LAB — DIFFERENTIAL
Basophils Absolute: 0 10*3/uL (ref 0.0–0.1)
Basophils Relative: 1 % (ref 0–1)
Eosinophils Absolute: 0.3 10*3/uL (ref 0.0–0.7)
Eosinophils Relative: 6 % — ABNORMAL HIGH (ref 0–5)

## 2011-06-06 LAB — COMPREHENSIVE METABOLIC PANEL
ALT: 12 U/L (ref 0–53)
AST: 18 U/L (ref 0–37)
Calcium: 8.9 mg/dL (ref 8.4–10.5)
Sodium: 134 mEq/L — ABNORMAL LOW (ref 135–145)
Total Protein: 6.9 g/dL (ref 6.0–8.3)

## 2011-06-06 LAB — GLUCOSE, CAPILLARY
Glucose-Capillary: 115 mg/dL — ABNORMAL HIGH (ref 70–99)
Glucose-Capillary: 329 mg/dL — ABNORMAL HIGH (ref 70–99)

## 2011-06-06 MED ORDER — SODIUM CHLORIDE 0.9 % IJ SOLN: 3.0000 mL | INTRAMUSCULAR | Status: DC | PRN

## 2011-06-06 MED ORDER — LINAGLIPTIN 5 MG PO TABS
5.0000 mg | ORAL_TABLET | Freq: Every day | ORAL | Status: DC
  Administered 2011-06-07 – 2011-06-08 (×2): 5 mg via ORAL
  Filled 2011-06-06 (×3): qty 1

## 2011-06-06 MED ORDER — SODIUM CHLORIDE 0.9 % IV SOLN
INTRAVENOUS | Status: DC
  Administered 2011-06-06: 16:00:00 via INTRAVENOUS

## 2011-06-06 MED ORDER — INSULIN ASPART 100 UNIT/ML ~~LOC~~ SOLN
0.0000 [IU] | Freq: Three times a day (TID) | SUBCUTANEOUS | Status: DC
  Administered 2011-06-07: 2 [IU] via SUBCUTANEOUS
  Administered 2011-06-07: 3 [IU] via SUBCUTANEOUS
  Administered 2011-06-08: 2 [IU] via SUBCUTANEOUS
  Administered 2011-06-08: 1 [IU] via SUBCUTANEOUS
  Filled 2011-06-06: qty 3

## 2011-06-06 MED ORDER — BIMATOPROST 0.03 % OP SOLN
1.0000 [drp] | Freq: Every day | OPHTHALMIC | Status: DC
  Administered 2011-06-06 – 2011-06-07 (×2): 1 [drp] via OPHTHALMIC
  Filled 2011-06-06: qty 2.5

## 2011-06-06 MED ORDER — ACETAMINOPHEN 325 MG PO TABS
650.0000 mg | ORAL_TABLET | Freq: Four times a day (QID) | ORAL | Status: DC | PRN
Start: 2011-06-06 — End: 2011-06-08

## 2011-06-06 MED ORDER — SODIUM CHLORIDE 0.9 % IV SOLN: 250.0000 mL | INTRAVENOUS | Status: DC | PRN

## 2011-06-06 MED ORDER — OMEGA-3 FATTY ACIDS 1000 MG PO CAPS: 1.0000 g | ORAL_CAPSULE | Freq: Three times a day (TID) | ORAL | Status: DC

## 2011-06-06 MED ORDER — DOCUSATE SODIUM 100 MG PO CAPS
100.0000 mg | ORAL_CAPSULE | Freq: Two times a day (BID) | ORAL | Status: DC
  Administered 2011-06-06 – 2011-06-08 (×4): 100 mg via ORAL
  Filled 2011-06-06 (×5): qty 1

## 2011-06-06 MED ORDER — GABAPENTIN 600 MG PO TABS
600.0000 mg | ORAL_TABLET | Freq: Three times a day (TID) | ORAL | Status: DC
  Administered 2011-06-06 – 2011-06-08 (×5): 600 mg via ORAL
  Filled 2011-06-06 (×7): qty 1

## 2011-06-06 MED ORDER — SIMVASTATIN 40 MG PO TABS
40.0000 mg | ORAL_TABLET | Freq: Every day | ORAL | Status: DC
  Administered 2011-06-06 – 2011-06-07 (×2): 40 mg via ORAL
  Filled 2011-06-06 (×3): qty 1

## 2011-06-06 MED ORDER — NITROGLYCERIN 0.4 MG SL SUBL: 0.4000 mg | SUBLINGUAL_TABLET | SUBLINGUAL | Status: DC | PRN

## 2011-06-06 MED ORDER — LOSARTAN POTASSIUM 50 MG PO TABS
50.0000 mg | ORAL_TABLET | Freq: Every day | ORAL | Status: DC
  Administered 2011-06-07 – 2011-06-08 (×2): 50 mg via ORAL
  Filled 2011-06-06 (×2): qty 1

## 2011-06-06 MED ORDER — ENOXAPARIN SODIUM 40 MG/0.4ML ~~LOC~~ SOLN
40.0000 mg | SUBCUTANEOUS | Status: DC
  Administered 2011-06-06 – 2011-06-07 (×2): 40 mg via SUBCUTANEOUS
  Filled 2011-06-06 (×3): qty 0.4

## 2011-06-06 MED ORDER — BENZONATATE 100 MG PO CAPS
100.0000 mg | ORAL_CAPSULE | Freq: Four times a day (QID) | ORAL | Status: DC | PRN
  Filled 2011-06-06: qty 1

## 2011-06-06 MED ORDER — ONDANSETRON HCL 4 MG PO TABS: 4.0000 mg | ORAL_TABLET | Freq: Four times a day (QID) | ORAL | Status: DC | PRN

## 2011-06-06 MED ORDER — ALUM & MAG HYDROXIDE-SIMETH 200-200-20 MG/5ML PO SUSP: 30.0000 mL | Freq: Four times a day (QID) | ORAL | Status: DC | PRN

## 2011-06-06 MED ORDER — ONDANSETRON HCL 4 MG/2ML IJ SOLN: 4.0000 mg | Freq: Four times a day (QID) | INTRAMUSCULAR | Status: DC | PRN

## 2011-06-06 MED ORDER — ASPIRIN 81 MG PO TABS
81.0000 mg | ORAL_TABLET | Freq: Every day | ORAL | Status: DC
Start: 2011-06-06 — End: 2011-06-06

## 2011-06-06 MED ORDER — OMEGA-3-ACID ETHYL ESTERS 1 G PO CAPS
1.0000 g | ORAL_CAPSULE | Freq: Three times a day (TID) | ORAL | Status: DC
  Administered 2011-06-06 – 2011-06-08 (×5): 1 g via ORAL
  Filled 2011-06-06 (×7): qty 1

## 2011-06-06 MED ORDER — INSULIN GLARGINE 100 UNIT/ML ~~LOC~~ SOLN
10.0000 [IU] | Freq: Two times a day (BID) | SUBCUTANEOUS | Status: DC
  Administered 2011-06-06 – 2011-06-08 (×4): 10 [IU] via SUBCUTANEOUS
  Filled 2011-06-06 (×2): qty 3

## 2011-06-06 MED ORDER — ASPIRIN 81 MG PO CHEW
81.0000 mg | CHEWABLE_TABLET | Freq: Every day | ORAL | Status: DC
  Administered 2011-06-07 – 2011-06-08 (×2): 81 mg via ORAL
  Filled 2011-06-06 (×2): qty 1

## 2011-06-06 MED ORDER — ZOLPIDEM TARTRATE 5 MG PO TABS: 5.0000 mg | ORAL_TABLET | Freq: Every evening | ORAL | Status: DC | PRN

## 2011-06-06 MED ORDER — ACETAMINOPHEN 650 MG RE SUPP: 650.0000 mg | Freq: Four times a day (QID) | RECTAL | Status: DC | PRN
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
## 2011-06-06 NOTE — ED Provider Notes (Signed)
History     CSN: [id number]  Arrival date & time 06/06/11  62   First MD Initiated Contact with Patient 06/06/11 1248      Chief Complaint  Patient presents with  . Near Syncope    (Consider location/radiation/quality/duration/timing/severity/associated sxs/prior treatment) HPI Comments: Patient was in church this morning.  Became unresponsive in pews.  Wife stood up to sing and noticed he was still sitting with his head arched backward.  He initially had snoring respirations and was unresponive.  Then did not seem as though he was breathing.  While EMS was in en route, the patient began to come around and is now feeling better.  Denies any pain.  States that he does have some recollection of the incident but could not respond to those trying to wake him up.    Patient is a 77 y.o. male presenting with syncope. The history is provided by the patient.  Loss of Consciousness This is a new problem. The current episode started less than 1 hour ago. Episode frequency: Once. The problem has been resolved. Pertinent negatives include no chest pain, no headaches and no shortness of breath. The symptoms are aggravated by nothing. The symptoms are relieved by nothing. He has tried nothing for the symptoms.    Past Medical History  Diagnosis Date  . Hypertension   . Prostate cancer 1996    s/p external radiation  . Carotid stenosis, bilateral per doppler 08-20-09    mild  . DDD (degenerative disc disease), lumbar     s/p fusion 4-5  . Renal insufficiency hx  . Coronary artery disease ?date last stress test    hx cabg and redo-- last visit cardiologist dr Olevia Perches 6 yrs ago, sees pcp dr Gwyndolyn Saxon hopper  . Diabetic peripheral neuropathy     feet  . Diabetes mellitus     oral med and insuin  . Bladder tumor     hematuria  . Nocturia   . Glaucoma bilateral     eye drop rx  . HOH (hard of hearing) no hearing aids  . History of hiatal hernia     denies reflux    Past Surgical History    Procedure Date  . Upper gastrointestinal endoscopy     dx hiatal hernia  . Lumbar fusion 01/2004    L 4-5  . Coronary artery bypass graft 1984    4 vessels  . Coronary artery bypass graft 1997- redo    3 vessels  . Lipoma excision 1970's    from back  . Cataract extraction w/ intraocular lens  implant, bilateral   . Cardiac catheterization last cath 2005    per dr Olevia Perches- w/ chart (grafts patent)-  results w/ chart  . Transurethral resection of bladder tumor 04/16/2011    Procedure: TRANSURETHRAL RESECTION OF BLADDER TUMOR (TURBT);  Surgeon: Claybon Jabs;  Location: [facility];  Service: Urology;  Laterality: N/A;    No family history on file.  History  Substance Use Topics  . Smoking status: Never Smoker   . Smokeless tobacco: Never Used  . Alcohol Use: No      Review of Systems  Respiratory: Negative for shortness of breath.   Cardiovascular: Positive for syncope. Negative for chest pain.  Neurological: Negative for headaches.  All other systems reviewed and are negative.    Allergies  Codeine and Ace inhibitors  Home Medications   Current Outpatient Rx  Name Route Sig Dispense Refill  . AMLODIPINE BESYLATE  10 MG PO TABS Oral Take 10 mg by mouth daily.     . ASPIRIN 81 MG PO TABS Oral Take 81 mg by mouth daily.     Marland Kitchen BIMATOPROST 0.03 % OP SOLN Both Eyes Place 1 drop into both eyes at bedtime.     Marland Kitchen CARVEDILOL 12.5 MG PO TABS       . OMEGA-3 FATTY ACIDS 1000 MG PO CAPS Oral Take 1 g by mouth 3 (three) times daily.     Marland Kitchen GABAPENTIN 300 MG PO CAPS Oral Take 600 mg by mouth 3 (three) times daily.     Marland Kitchen HYDROCODONE-ACETAMINOPHEN 10-300 MG PO TABS Oral Take 1-2 tablets by mouth every 6 (six) hours as needed. For pain.     . INSULIN GLARGINE 100 UNIT/ML [place] SOLN Subcutaneous Inject 10 Units into the skin 2 (two) times daily. 10 units qam, and 5 units supper (amount of units can vary based on patients bloodsugar readings)    . INSULIN LISPRO (HUMAN) 100  UNIT/ML [place] SOLN Subcutaneous Inject 5 Units into the skin every morning.     . INSULIN LISPRO (HUMAN) 100 UNIT/ML [place] SOLN Subcutaneous Inject 6 Units into the skin every evening. Pt takes usually 6 units every pm. But does follow ss     . LOSARTAN POTASSIUM-HCTZ 100-25 MG PO TABS Oral Take 1 tablet by mouth daily.     Marland Kitchen NITROGLYCERIN 0.4 MG SL SUBL Sublingual Place 1 tablet (0.4 mg total) under the tongue every 5 (five) minutes as needed. For chest pain. Patient out of Rx 25 tablet 5  . SIMVASTATIN 40 MG PO TABS Oral Take 1 tablet (40 mg total) by mouth at bedtime. 90 tablet 2  . SITAGLIPTIN PHOSPHATE 100 MG PO TABS Oral Take 100 mg by mouth daily. 1/2 by mouth once daily.      BP 137/63  Pulse 52  Temp(Src) 97.5 F (36.4 C) (Oral)  Resp 18  SpO2 95%  Physical Exam  Constitutional: He is oriented to person, place, and time. He appears well-developed and well-nourished. No distress.  HENT:  Head: Normocephalic and atraumatic.  Mouth/Throat: Oropharynx is clear and moist.  Eyes: EOM are normal. Pupils are equal, round, and reactive to light.  Neck: Normal range of motion. Neck supple.  Cardiovascular: Normal rate and regular rhythm.  Exam reveals no friction rub.   No murmur heard. Pulmonary/Chest: Effort normal and breath sounds normal. No respiratory distress. He has no wheezes. He has no rales.  Abdominal: Soft. Bowel sounds are normal. He exhibits no distension. There is no tenderness.  Musculoskeletal: Normal range of motion. He exhibits no edema.  Lymphadenopathy:    He has no cervical adenopathy.  Neurological: He is alert and oriented to person, place, and time.  Skin: Skin is warm and dry.    ED Course  Procedures (including critical care time)   Labs Reviewed  CBC  DIFFERENTIAL  COMPREHENSIVE METABOLIC PANEL  CARDIAC PANEL(CRET KIN+CKTOT+MB+TROPI)   No results found.   No diagnosis found.   Date: 06/06/2011  Rate: 51  Rhythm: sinus bradycardia  QRS Axis:  left  Intervals: normal  ST/T Wave abnormalities: nonspecific ST changes  Conduction Disutrbances:first-degree A-V block   Narrative Interpretation:   Old EKG Reviewed: First degree avblock is new.    MDM  Workup shows normal head ct, labs that look okay, but an ekg that shows a first degree av block.  I am unsure of the significance of this.  The patient tells  me he ran out of his coreg two weeks ago and just restarted it this morning.  He feels fine now and is without complaint.  I am unsure as to what caused the syncope he experienced.  It could possibly be related to blood pressure or arrhythmia.  Will consult medicine for admission as patient has significant cardiac history and syncope.  I spoke with the family who also reports patient may not have been taking his meds properly.  He ran out of coreg too soon and was restarted on a higher concentration of coreg but still taking one pill instead of 1/2.  This could be the cause of the first degree block and syncopal event.  Either way, I believe the patient requires admission.        Veryl Speak, MD 06/06/11 [phone]

## 2011-06-06 NOTE — ED Notes (Signed)
CBG 95 at 13:14

## 2011-06-06 NOTE — ED Notes (Signed)
Sitting at church, then immed. His head went back and snoaring respirations. Tired. No n/v, no diaphoresis. Neg orthostatic changes. cbg 96. Pr 142/64, 60.

## 2011-06-06 NOTE — ED Notes (Signed)
CBG 115 at 16:52

## 2011-06-06 NOTE — ED Notes (Signed)
Attempt to call report to floor, nurse unavailable at this time.

## 2011-06-06 NOTE — ED Notes (Signed)
Admitting MD at bedside.

## 2011-06-07 DIAGNOSIS — I059 Rheumatic mitral valve disease, unspecified: Secondary | ICD-10-CM

## 2011-06-07 LAB — BASIC METABOLIC PANEL
BUN: 24 mg/dL — ABNORMAL HIGH (ref 6–23)
Calcium: 8.5 mg/dL (ref 8.4–10.5)
Chloride: 102 mEq/L (ref 96–112)
Creatinine, Ser: 1.35 mg/dL (ref 0.50–1.35)
GFR calc Af Amer: 52 mL/min — ABNORMAL LOW (ref >90)
GFR calc non Af Amer: 45 mL/min — ABNORMAL LOW (ref >90)

## 2011-06-07 LAB — CBC
HCT: 38.3 % — ABNORMAL LOW (ref 39.0–52.0)
MCHC: 34.2 g/dL (ref 30.0–36.0)
Platelets: 123 10*3/uL — ABNORMAL LOW (ref 150–400)
RDW: 14.5 % (ref 11.5–15.5)
WBC: 4.1 10*3/uL (ref 4.0–10.5)

## 2011-06-07 LAB — CARDIAC PANEL(CRET KIN+CKTOT+MB+TROPI)
CK, MB: 3.1 ng/mL (ref 0.3–4.0)
Relative Index: 2.3 (ref 0.0–2.5)
Relative Index: 2.2 (ref 0.0–2.5)
Total CK: 154 U/L (ref 7–232)
Total CK: 142 U/L (ref 7–232)
Troponin I: <0.3 ng/mL (ref <0.30)

## 2011-06-07 LAB — GLUCOSE, CAPILLARY: Glucose-Capillary: 118 mg/dL — ABNORMAL HIGH (ref 70–99)

## 2011-06-07 MED ORDER — POTASSIUM CHLORIDE CRYS ER 20 MEQ PO TBCR
40.0000 meq | EXTENDED_RELEASE_TABLET | Freq: Once | ORAL | Status: AC
  Administered 2011-06-07: 40 meq via ORAL
  Filled 2011-06-07: qty 2

## 2011-06-07 NOTE — Progress Notes (Signed)
  Echocardiogram 2D Echocardiogram has been performed.  Diamond Nickel Deneen 06/07/2011, 12:20 PM

## 2011-06-07 NOTE — Progress Notes (Signed)
Patient ID: Leonard [name], male   DOB: Nov 03, 1920, 77 y.o.   MRN: [id number] Subjective: No events overnight. Patient denies chest pain, shortness of breath, abdominal pain. Had bowel movement and reports ambulating.  Objective:  Vital signs in last 24 hours:  Filed Vitals:   06/06/11 2020 06/07/11 0100 06/07/11 0530 06/07/11 1335  BP: 188/80 113/74 130/67 156/71  Pulse: 58 73 58 58  Temp: 97.6 F (36.4 C)  97.9 F (36.6 C) 97.5 F (36.4 C)  TempSrc:    Oral  Resp: 18  14 18   Height: 5' 5.5" (1.664 m)     Weight: 67.6 kg (149 lb 0.5 oz)     SpO2: 97%  98% 99%    Intake/Output from previous day:   Intake/Output Summary (Last 24 hours) at 06/07/11 1622 Last data filed at 06/07/11 1300  Gross per 24 hour  Intake    840 ml  Output      0 ml  Net    840 ml    Physical Exam: General: Alert, awake, oriented x3, in no acute distress. HEENT: No bruits, no goiter. Moist mucous membranes, no scleral icterus, no conjunctival pallor. Heart: Regular rate and rhythm, S1/S2 +, no murmurs, rubs, gallops. Lungs: Clear to auscultation bilaterally. No wheezing, no rhonchi, no rales.  Abdomen: Soft, nontender, nondistended, positive bowel sounds. Extremities: No clubbing or cyanosis, no pitting edema,  positive pedal pulses. Neuro: Grossly nonfocal.  Lab Results:  Basic Metabolic Panel:    Component Value Date/Time   NA 137 06/07/2011 0500   K 3.1* 06/07/2011 0500   CL 102 06/07/2011 0500   CO2 26 06/07/2011 0500   BUN 24* 06/07/2011 0500   CREATININE 1.35 06/07/2011 0500   GLUCOSE 119* 06/07/2011 0500   CALCIUM 8.5 06/07/2011 0500   CBC:    Component Value Date/Time   WBC 4.1 06/07/2011 0500   HGB 13.1 06/07/2011 0500   HCT 38.3* 06/07/2011 0500   PLT 123* 06/07/2011 0500   MCV 84.7 06/07/2011 0500   NEUTROABS 2.0 06/06/2011 1319   LYMPHSABS 1.6 06/06/2011 1319   MONOABS 0.4 06/06/2011 1319   EOSABS 0.3 06/06/2011 1319   BASOSABS 0.0 06/06/2011 1319      Lab  06/07/11 0500 06/06/11 1319  WBC 4.1 4.4  HGB 13.1 13.5  HCT 38.3* 39.5  PLT 123* 120*  MCV 84.7 84.8  MCH 29.0 29.0  MCHC 34.2 34.2  RDW 14.5 14.7  LYMPHSABS -- 1.6  MONOABS -- 0.4  EOSABS -- 0.3  BASOSABS -- 0.0  BANDABS -- --    Lab 06/07/11 0500 06/06/11 1319  NA 137 134*  K 3.1* 3.4*  CL 102 101  CO2 26 23  GLUCOSE 119* 102*  BUN 24* 29*  CREATININE 1.35 1.62*  CALCIUM 8.5 8.9  MG 2.2 --   No results found for this basename: INR:5,PROTIME:5 in the last 168 hours Cardiac markers:  Lab 06/07/11 0444 06/06/11 2233 06/06/11 1645  CKMB 3.1 3.5 3.5  TROPONINI <0.30 <0.30 <0.30  MYOGLOBIN -- -- --   No components found with this basename: POCBNP:3 No results found for this or any previous visit (from the past 240 hour(s)).  Studies/Results: Ct Head Wo Contrast  06/06/2011  *RADIOLOGY REPORT*  Clinical Data: Near-syncope  CT HEAD WITHOUT CONTRAST  Technique:  Contiguous axial images were obtained from the base of the skull through the vertex without contrast.  Comparison: 04/28/2011  Findings: No skull fracture is noted.  No intracranial hemorrhage, mass effect  or midline shift.  No acute infarction.  No mass lesion is noted on this unenhanced scan.  The mastoid air cells are unremarkable.  Atherosclerotic calcifications of carotid siphon are again noted.  There is mild mucosal thickening posterior aspect of the left sphenoid sinus.  Stable atrophy and chronic white matter disease.  IMPRESSION: No acute intracranial abnormality.  Stable atrophy and chronic white matter disease.  Mild mucosal thickening posterior aspect of the left sphenoid sinus.  Original Report Authenticated By: Lahoma Crocker, M.D.    Medications: Scheduled Meds:   . aspirin  81 mg Oral Daily  . bimatoprost  1 drop Both Eyes QHS  . docusate sodium  100 mg Oral BID  . enoxaparin  40 mg Subcutaneous Q24H  . gabapentin  600 mg Oral TID  . insulin aspart  0-9 Units Subcutaneous TID WC  . insulin glargine   10 Units Subcutaneous BID  . linagliptin  5 mg Oral QAC breakfast  . losartan  50 mg Oral Daily  . omega-3 acid ethyl esters  1 g Oral TID  . potassium chloride  40 mEq Oral Once  . simvastatin  40 mg Oral QHS  . DISCONTD: sodium chloride   Intravenous STAT  . DISCONTD: aspirin  81 mg Oral Daily  . DISCONTD: fish oil-omega-3 fatty acids  1 g Oral TID   Continuous Infusions:  PRN Meds:.sodium chloride, acetaminophen, acetaminophen, alum & mag hydroxide-simeth, benzonatate, nitroGLYCERIN, ondansetron (ZOFRAN) IV, ondansetron, sodium chloride, zolpidem  Assessment/Plan:  Principal Problem:   *Syncope and collapse - likely secondary to dehydration - resolved - patient feels fine at present - CT head negative  Active Problems:   Unspecified essential hypertension - continue home medications   Acute kidney injury - likely prerenal secondary to dehydration - resolved   Bradycardia by electrocardiogram - asymptomatic - 2-D ECHO with EF ~ 50%   DIABETES MELLITUS, TYPE II, UNCONTROLLE - continue to monitor CBG - continue home meds   EDUCATION - test results and diagnostic studies were discussed with patient and pt's family who was present at the bedside - patient and family have verbalized the understanding - questions were answered at the bedside and contact information was provided for additional questions or concerns   LOS: 1 day   [place], [place] 06/07/2011, 4:22 PM  [facility] Pager: [phone]

## 2011-06-08 LAB — BASIC METABOLIC PANEL
CO2: 25 mEq/L (ref 19–32)
Calcium: 8.6 mg/dL (ref 8.4–10.5)
Chloride: 100 mEq/L (ref 96–112)
Glucose, Bld: 206 mg/dL — ABNORMAL HIGH (ref 70–99)
Potassium: 3.6 mEq/L (ref 3.5–5.1)
Sodium: 134 mEq/L — ABNORMAL LOW (ref 135–145)

## 2011-06-08 LAB — GLUCOSE, CAPILLARY: Glucose-Capillary: 153 mg/dL — ABNORMAL HIGH (ref 70–99)

## 2011-06-08 NOTE — Progress Notes (Signed)
Pt d/c'd home in stable condition with family. Assessment unchanged from this am, no further questions, d/c'd to private vehicle via wheelchair
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
## 2011-06-17 ENCOUNTER — Ambulatory Visit (INDEPENDENT_AMBULATORY_CARE_PROVIDER_SITE_OTHER): Payer: Medicare Other | Admitting: Internal Medicine

## 2011-06-17 ENCOUNTER — Ambulatory Visit: Payer: No Typology Code available for payment source | Admitting: Internal Medicine

## 2011-06-17 ENCOUNTER — Encounter: Payer: Self-pay | Admitting: Internal Medicine

## 2011-06-17 DIAGNOSIS — L259 Unspecified contact dermatitis, unspecified cause: Secondary | ICD-10-CM

## 2011-06-17 DIAGNOSIS — I10 Essential (primary) hypertension: Secondary | ICD-10-CM

## 2011-06-17 DIAGNOSIS — R55 Syncope and collapse: Secondary | ICD-10-CM

## 2011-06-17 DIAGNOSIS — L309 Dermatitis, unspecified: Secondary | ICD-10-CM

## 2011-06-17 MED ORDER — MOMETASONE FUROATE 0.1 % EX OINT: TOPICAL_OINTMENT | Freq: Two times a day (BID) | CUTANEOUS | Status: DC

## 2011-06-17 NOTE — Assessment & Plan Note (Signed)
He has had significant blood pressure elevations off carvedilol and amlodipine. If blood pressure average is not remain below 140/90, ideally less than 135/85; amlodipine 10 mg one half daily will be reinitiated. If blood pressure continues to be above referred average, the carvedilol would be restarted and titrated slowly.

## 2011-06-17 NOTE — Progress Notes (Signed)
  Subjective:    Patient ID: Leonard [name], male    DOB: July 19, 1920, 78 y.o.   MRN: [id number]  HPI 06/06/2011 emergency room written records were reviewed. He was seen for syncope; this was attributed to a combination of dehydration (BUN 24) and bradycardia. He was also found to have a potassium of 3.4. CAT scan of the brain revealed no acute process. Echocardiogram revealed an ejection fraction less than 50%. Carvedilol  25 mg twice a day and amlodipine 10 mg  were discontinued. Off these medications blood pressure has been as high as 169/88.  He has not had a recurrence of his syncope. Repeat potassium was 3.6 on 06/08/11. BUN was 21 indicating good hydration.    Review of Systems he has had a pruritic  rash over the left lateral calf intermittently for 2 years. It has waxed and waned, but exacerbated recently     Objective:   Physical Exam He is thin but  healthy and well-nourished; he is in no acute distress  L > R  carotid bruits are present.  Heart rhythm and rate are normal with no  Gallop. Grade 1.5 /6 systolic murmur   Chest is clear with no increased work of breathing  There is no evidence of aortic aneurysm or renal artery bruits  He has no clubbing or edema.   Pedal pulses are intact   No ischemic skin changes are present ; there is an erythematous, 6X 4 cm  eczematoid like rash over the calf laterally        Assessment & Plan:  #1 syncope; attributed to dehydration and bradycardia  #2 dermatitis, recurrent  Plan: See orders and recommendations

## 2011-06-17 NOTE — Patient Instructions (Signed)
Mix Eucerin 1 part to steroid ointment  1 part and apply  Twice a day to itchy area of skin as needed if the appointment alone does not resolve the rash . Blood Pressure Goal  Ideally is an AVERAGE < 140/90; ideally < 135/85. This AVERAGE should be calculated from @ least 5-7 BP readings taken @ different times of day on different days of week. You should not respond to isolated BP readings , but rather the AVERAGE for that week . If the blood pressure remains elevated we'll restart the amlodipine 10 mg dose one half daily.

## 2011-08-07 ENCOUNTER — Emergency Department (INDEPENDENT_AMBULATORY_CARE_PROVIDER_SITE_OTHER)
Admission: EM | Admit: 2011-08-07 | Discharge: 2011-08-07 | Disposition: A | Discharge status: Home / Self Care | Payer: Medicare Other | Source: Home / Self Care | Attending: Family Medicine | Admitting: Family Medicine

## 2011-08-07 ENCOUNTER — Encounter (HOSPITAL_COMMUNITY): Payer: Self-pay

## 2011-08-07 DIAGNOSIS — H6980 Other specified disorders of Eustachian tube, unspecified ear: Secondary | ICD-10-CM

## 2011-08-07 MED ORDER — CETIRIZINE-PSEUDOEPHEDRINE ER 5-120 MG PO TB12: ORAL_TABLET | ORAL | Status: DC

## 2011-08-07 MED ORDER — FLUTICASONE PROPIONATE 50 MCG/ACT NA SUSP: 2.0000 | Freq: Every day | NASAL | Status: DC

## 2011-08-07 MED ORDER — CEFUROXIME AXETIL 500 MG PO TABS: 500.0000 mg | ORAL_TABLET | Freq: Two times a day (BID) | ORAL | Status: AC

## 2011-08-07 NOTE — ED Notes (Signed)
Pt has bilateral ear pain that started last night.

## 2011-08-07 NOTE — Discharge Instructions (Signed)
Barotitis Media Barotitis media is soreness (inflammation) of the area behind the eardrum (middle ear). This occurs when the auditory tube (Eustachian tube) leading from the back of the throat to the eardrum is blocked. When it is blocked air cannot move in and out of the middle ear to equalize pressure changes. These pressure changes come from changes in altitude when:  Flying.   Driving in the mountains.   Diving.  Problems are more likely to occur with pressure changes during times when you are congested as from:  Hay fever.   Upper respiratory infection.   A cold.  Damage or hearing loss (barotrauma) caused by this may be permanent. HOME CARE INSTRUCTIONS   Use medicines as recommended by your caregiver. Over the counter medicines will help unblock the canal and can help during times of air travel.   Do not put anything into your ears to clean or unplug them. Eardrops will not be helpful.   Do not swim, dive, or fly until your caregiver says it is all right to do so. If these activities are necessary, chewing gum with frequent swallowing may help. It is also helpful to hold your nose and gently blow to pop your ears for equalizing pressure changes. This forces air into the Eustachian tube.   For little ones with problems, give your baby a bottle of water or juice during periods when pressure changes would be anticipated such as during take offs and landings associated with air travel.   Only take over-the-counter or prescription medicines for pain, discomfort, or fever as directed by your caregiver.   A decongestant may be helpful in de-congesting the middle ear and make pressure equalization easier. This can be even more effective if the drops (spray) are delivered with the head lying over the edge of a bed with the head tilted toward the ear on the affected side.   If your caregiver has given you a follow-up appointment, it is very important to keep that appointment. Not keeping  the appointment could result in a chronic or permanent injury, pain, hearing loss and disability. If there is any problem keeping the appointment, you must call back to this facility for assistance.  SEEK IMMEDIATE MEDICAL CARE IF:   You develop a severe headache, dizziness, severe ear pain, or bloody or pus-like drainage from your ears.   An oral temperature above 102 F (38.9 C) develops.   Your problems do not improve or become worse.  MAKE SURE YOU:   Understand these instructions.   Will watch your condition.   Will get help right away if you are not doing well or get worse.  Document Released: 05/21/2000 Document Revised: 02/03/2011 Document Reviewed: 12/28/2007 [employer] Patient Information 2012 [employer]. 

## 2011-08-07 NOTE — ED Provider Notes (Signed)
History     CSN: [id number]  Arrival date & time 08/07/11  1420   First MD Initiated Contact with Patient 08/07/11 1423      Chief Complaint  Patient presents with  . Otalgia    (Consider location/radiation/quality/duration/timing/severity/associated sxs/prior treatment) Patient is a 78 y.o. male presenting with ear pain. The history is provided by the patient.  Otalgia This is a new problem. The current episode started 6 to 12 hours ago. There is pain in both ears. The problem occurs hourly. The problem has not changed since onset.There has been no fever. The pain is moderate. Associated symptoms include hearing loss. Pertinent negatives include no ear discharge, no headaches, no rhinorrhea, no sore throat, no abdominal pain, no diarrhea, no vomiting, no neck pain, no cough and no rash. His past medical history does not include chronic ear infection, hearing loss or tympanostomy tube.  Otalgia  There is pain in both ears. This is a new problem. The current episode started 6 to 12 hours ago. The problem occurs hourly. The problem has not changed since onset.There has been no fever. The pain is moderate. Associated symptoms include hearing loss. Pertinent negatives include no abdominal pain, coughing, diarrhea, ear discharge, headaches, neck pain, rash, rhinorrhea, sore throat or vomiting. There is no history of a chronic ear infection, hearing loss or a tympanostomy tube.    Past Medical History  Diagnosis Date  . Hypertension   . Prostate cancer 1996    s/p external radiation  . Carotid stenosis, bilateral per doppler 08-20-09    mild  . DDD (degenerative disc disease), lumbar     s/p fusion 4-5  . Renal insufficiency hx  . Coronary artery disease ?date last stress test    hx cabg and redo-- last visit cardiologist dr Olevia Perches 6 yrs ago, sees pcp dr Gwyndolyn Saxon hopper  . Diabetic peripheral neuropathy     feet  . Diabetes mellitus     oral med and insuin  . Bladder tumor    hematuria  . Nocturia   . Glaucoma bilateral     eye drop rx  . HOH (hard of hearing) no hearing aids  . History of hiatal hernia     denies reflux  . Syncope 06/06/2011    In context of dehydration and bradycardia.    Past Surgical History  Procedure Date  . Upper gastrointestinal endoscopy     dx hiatal hernia  . Lumbar fusion 01/2004    L 4-5  . Coronary artery bypass graft 1984    4 vessels  . Coronary artery bypass graft 1997- redo    3 vessels  . Lipoma excision 1970's    from back  . Cataract extraction w/ intraocular lens  implant, bilateral   . Cardiac catheterization last cath 2005    per dr Olevia Perches- w/ chart (grafts patent)-  results w/ chart  . Transurethral resection of bladder tumor 04/16/2011    Procedure: TRANSURETHRAL RESECTION OF BLADDER TUMOR (TURBT);  Surgeon: Claybon Jabs;  Location: [facility];  Service: Urology;  Laterality: N/A;    History reviewed. No pertinent family history.  History  Substance Use Topics  . Smoking status: Never Smoker   . Smokeless tobacco: Never Used  . Alcohol Use: No      Review of Systems  HENT: Positive for hearing loss and ear pain. Negative for sore throat, rhinorrhea, neck pain and ear discharge.   Respiratory: Negative for cough.   Gastrointestinal: Negative for  vomiting, abdominal pain and diarrhea.  Skin: Negative for rash.  Neurological: Negative for headaches.  All other systems reviewed and are negative.    Allergies  Codeine and Ace inhibitors  Home Medications   Current Outpatient Rx  Name Route Sig Dispense Refill  . ASPIRIN 81 MG PO TABS Oral Take 81 mg by mouth daily.     Marland Kitchen BIMATOPROST 0.03 % OP SOLN Both Eyes Place 1 drop into both eyes at bedtime.     . OMEGA-3 FATTY ACIDS 1000 MG PO CAPS Oral Take 1 g by mouth 3 (three) times daily.     Marland Kitchen GABAPENTIN 600 MG PO TABS Oral Take 600 mg by mouth 3 (three) times daily.      . INSULIN GLARGINE 100 UNIT/ML [place] SOLN Subcutaneous  Inject 8 Units into the skin 2 (two) times daily.     . INSULIN LISPRO (HUMAN) 100 UNIT/ML [place] SOLN Subcutaneous Inject 5 Units into the skin every morning.     . INSULIN LISPRO (HUMAN) 100 UNIT/ML [place] SOLN Subcutaneous Inject 6-10 Units into the skin 2 (two) times daily before lunch and supper. Per sliding scale.     Marland Kitchen LOSARTAN POTASSIUM-HCTZ 100-25 MG PO TABS Oral Take 1 tablet by mouth daily.     . MOMETASONE FUROATE 0.1 % EX OINT Topical Apply topically 2 (two) times daily. 45 g 0  . NITROGLYCERIN 0.4 MG SL SUBL Sublingual Place 0.4 mg under the tongue every 5 (five) minutes as needed. For chest pain. Patient out of Rx     . SIMVASTATIN 40 MG PO TABS Oral Take 40 mg by mouth at bedtime.      Marland Kitchen SITAGLIPTIN PHOSPHATE 100 MG PO TABS Oral Take 50 mg by mouth daily.       BP 161/69  Pulse 65  Temp(Src) 97.7 F (36.5 C) (Oral)  Resp 16  SpO2 100%  Physical Exam  Nursing note and vitals reviewed. Constitutional: He is oriented to person, place, and time. He appears well-developed and well-nourished. No distress.       Elderly BM  HENT:  Head: Normocephalic and atraumatic.  Right Ear: Tympanic membrane and external ear normal.  Left Ear: Tympanic membrane and external ear normal.  Nose: Mucosal edema present. No rhinorrhea. Right sinus exhibits no maxillary sinus tenderness and no frontal sinus tenderness. Left sinus exhibits no maxillary sinus tenderness and no frontal sinus tenderness.  Mouth/Throat: Oropharynx is clear and moist. No oral lesions. No oropharyngeal exudate or posterior oropharyngeal edema.       Cerumen in both ears R>L Tenderness along both outer ear area consistent w/eustacian tube dysfunction  Eyes: Pupils are equal, round, and reactive to light. Right eye exhibits no discharge. Left eye exhibits no discharge. No scleral icterus.  Neck: Neck supple.  Cardiovascular: Normal heart sounds.   Pulmonary/Chest: He has no wheezes. He has no rales.  Lymphadenopathy:    He has  cervical adenopathy.  Neurological: He is alert and oriented to person, place, and time.  Skin: Skin is warm and dry.  Psychiatric: He has a normal mood and affect.     ED Course  Procedures (including critical care time)  Eustacian tube dysfunction    MDM

## 2011-08-19 ENCOUNTER — Encounter: Payer: Self-pay | Admitting: Family Medicine

## 2011-08-19 ENCOUNTER — Ambulatory Visit (INDEPENDENT_AMBULATORY_CARE_PROVIDER_SITE_OTHER)
Admission: RE | Admit: 2011-08-19 | Discharge: 2011-08-19 | Disposition: A | Discharge status: Home / Self Care | Payer: Medicare Other | Source: Ambulatory Visit | Attending: Family Medicine | Admitting: Family Medicine

## 2011-08-19 ENCOUNTER — Ambulatory Visit (INDEPENDENT_AMBULATORY_CARE_PROVIDER_SITE_OTHER): Payer: Medicare Other | Admitting: Family Medicine

## 2011-08-19 VITALS — BP 120/60 | HR 77 | Temp 98.1°F | Wt 154.0 lb

## 2011-08-19 DIAGNOSIS — J40 Bronchitis, not specified as acute or chronic: Secondary | ICD-10-CM

## 2011-08-19 MED ORDER — AMOXICILLIN-POT CLAVULANATE 875-125 MG PO TABS: 1.0000 | ORAL_TABLET | Freq: Two times a day (BID) | ORAL | Status: AC

## 2011-08-19 NOTE — Progress Notes (Signed)
  Subjective:     Leonard [name] is a 78 y.o. male here for evaluation of a cough. Onset of symptoms was 2 weeks ago. Symptoms have been gradually worsening since that time. The cough is dry and productive and is aggravated by nothing. Associated symptoms include: chills, shortness of breath, sputum production and wheezing. Patient does not have a history of asthma. Patient does not have a history of environmental allergens. Patient has not traveled recently. Patient does not have a history of smoking. Patient has not had a previous chest x-ray. Patient has not had a PPD done.  The following portions of the patient's history were reviewed and updated as appropriate: allergies, current medications, past family history, past medical history, past social history, past surgical history and problem list.  Review of Systems Pertinent items are noted in HPI.    Objective:    Oxygen saturation 96% on room air BP 120/60  Pulse 77  Temp(Src) 98.1 F (36.7 C) (Oral)  Wt 154 lb (69.854 kg)  SpO2 96% General appearance: alert, cooperative, appears stated age and no distress Ears: normal TM's and external ear canals both ears Nose: Nares normal. Septum midline. Mucosa normal. No drainage or sinus tenderness. Throat: lips, mucosa, and tongue normal; teeth and gums normal Neck: mild anterior cervical adenopathy and thyroid not enlarged, symmetric, no tenderness/mass/nodules Lungs: rales RLL Heart: S1, S2 normal Extremities: extremities normal, atraumatic, no cyanosis or edema    Assessment:    Acute Bronchitis    Plan:    Antibiotics per medication orders. Antitussives per medication orders. Avoid exposure to tobacco smoke and fumes. Call if shortness of breath worsens, blood in sputum, change in character of cough, development of fever or chills, inability to maintain nutrition and hydration. Avoid exposure to tobacco smoke and fumes. Chest x-ray.

## 2011-08-19 NOTE — Patient Instructions (Signed)

## 2011-09-21 ENCOUNTER — Ambulatory Visit (INDEPENDENT_AMBULATORY_CARE_PROVIDER_SITE_OTHER): Payer: Medicare Other | Admitting: Internal Medicine

## 2011-09-21 ENCOUNTER — Encounter: Payer: Self-pay | Admitting: Internal Medicine

## 2011-09-21 VITALS — BP 150/78 | HR 64 | Temp 97.9°F | Wt 157.0 lb

## 2011-09-21 DIAGNOSIS — I10 Essential (primary) hypertension: Secondary | ICD-10-CM

## 2011-09-21 DIAGNOSIS — I498 Other specified cardiac arrhythmias: Secondary | ICD-10-CM

## 2011-09-21 DIAGNOSIS — R932 Abnormal findings on diagnostic imaging of liver and biliary tract: Secondary | ICD-10-CM | POA: Insufficient documentation

## 2011-09-21 DIAGNOSIS — IMO0001 Reserved for inherently not codable concepts without codable children: Secondary | ICD-10-CM

## 2011-09-21 DIAGNOSIS — R001 Bradycardia, unspecified: Secondary | ICD-10-CM

## 2011-09-21 DIAGNOSIS — R55 Syncope and collapse: Secondary | ICD-10-CM

## 2011-09-21 NOTE — Assessment & Plan Note (Signed)
Seen by Dr Wilson Singer, Endocrinologist 09/15/11

## 2011-09-21 NOTE — Patient Instructions (Addendum)
Blood Pressure Goal  Ideally is an AVERAGE < 135/85. This AVERAGE should be calculated from @ least 5-7 BP readings taken @ different times of day on different days of week. You should not respond to isolated BP readings , but rather the AVERAGE for that week   You should not drive until the cardiac evaluation is completed. If your heart is slowing down excessively it can  lead to loss of consciousness with  risk of a catastrophe to you or others on the road.
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
## 2011-09-21 NOTE — Assessment & Plan Note (Signed)
Blood pressure  ranges from 124/71 to high of 144/79 back on  amlodipine 10 mg daily at this time

## 2011-09-23 ENCOUNTER — Other Ambulatory Visit: Payer: Self-pay | Admitting: Internal Medicine

## 2011-09-28 ENCOUNTER — Encounter (INDEPENDENT_AMBULATORY_CARE_PROVIDER_SITE_OTHER): Payer: Medicare Other

## 2011-09-28 DIAGNOSIS — R55 Syncope and collapse: Secondary | ICD-10-CM

## 2011-09-29 NOTE — Progress Notes (Signed)
Pt never came in to have blood work done .

## 2011-09-29 NOTE — Progress Notes (Signed)
Pt never came in for blood work.

## 2011-09-29 NOTE — Progress Notes (Signed)
Pt never came into have blood work done, labs still in system.Marland Kitchen

## 2011-10-06 ENCOUNTER — Ambulatory Visit (INDEPENDENT_AMBULATORY_CARE_PROVIDER_SITE_OTHER): Payer: Medicare Other | Admitting: Internal Medicine

## 2011-10-06 ENCOUNTER — Encounter: Payer: Self-pay | Admitting: Internal Medicine

## 2011-10-06 VITALS — BP 177/86 | HR 60 | Ht 66.0 in | Wt 159.1 lb

## 2011-10-06 DIAGNOSIS — R55 Syncope and collapse: Secondary | ICD-10-CM

## 2011-10-06 DIAGNOSIS — I2510 Atherosclerotic heart disease of native coronary artery without angina pectoris: Secondary | ICD-10-CM
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
## 2011-10-06 NOTE — Patient Instructions (Signed)
We will see you back on an as needed basis.  Your physician recommends that you continue on your current medications as directed. Please refer to the Current Medication list given to you today.  

## 2011-10-06 NOTE — Assessment & Plan Note (Signed)
He is without symptoms currently we'll continue his current medications. Echocardiogram in December showed no wall motion abnormalities substrate for ventricular arrhythmias unlikely.

## 2011-10-14 ENCOUNTER — Telehealth: Payer: Self-pay | Admitting: Internal Medicine

## 2011-10-14 NOTE — Telephone Encounter (Signed)
The cardiologist feels this may be related to severe hypoglycemia. He will need to get clearance from his endocrinologist concerning ability to drive. Please fax our records over to his endocrinologist

## 2011-10-14 NOTE — Telephone Encounter (Signed)
Returned your call, call back when you can

## 2011-10-14 NOTE — Telephone Encounter (Signed)
Left message to call office

## 2011-10-14 NOTE — Telephone Encounter (Signed)
Patients wife stated they need instruction on what to do next so patient can keep his licsense. She stated he has seen cardiologist & hopper and they are unsure to what they need to do next. The DM[employer] has given him an small extension on getting his paperwork in and they do not want to let to much time go by. Please cal louella at [phone]

## 2011-10-15 NOTE — Telephone Encounter (Signed)
Discuss with patient, noted faxed to Dr Anda Kraft.

## 2011-10-15 NOTE — Telephone Encounter (Signed)
Tried to call Pt line busy will try again later.

## 2011-10-15 NOTE — Telephone Encounter (Signed)
Tried calling patient but the line was busy.     KP

## 2011-10-20 ENCOUNTER — Other Ambulatory Visit: Payer: Self-pay | Admitting: Internal Medicine

## 2011-10-25 ENCOUNTER — Other Ambulatory Visit: Payer: Self-pay | Admitting: Internal Medicine

## 2011-10-25 DIAGNOSIS — R4182 Altered mental status, unspecified: Secondary | ICD-10-CM

## 2011-10-25 DIAGNOSIS — E11649 Type 2 diabetes mellitus with hypoglycemia without coma: Secondary | ICD-10-CM

## 2011-11-15 ENCOUNTER — Encounter: Payer: Self-pay | Admitting: *Deleted

## 2012-03-18 ENCOUNTER — Other Ambulatory Visit: Payer: Self-pay | Admitting: Internal Medicine

## 2012-03-29 ENCOUNTER — Ambulatory Visit (INDEPENDENT_AMBULATORY_CARE_PROVIDER_SITE_OTHER): Payer: Medicare Other | Admitting: Internal Medicine

## 2012-03-29 ENCOUNTER — Encounter: Payer: Self-pay | Admitting: Internal Medicine

## 2012-03-29 VITALS — BP 146/80 | HR 65 | Wt 159.4 lb

## 2012-03-29 DIAGNOSIS — I10 Essential (primary) hypertension: Secondary | ICD-10-CM

## 2012-03-29 NOTE — Progress Notes (Signed)
  Subjective:    Patient ID: Leonard [name], male    DOB: 09-25-1920, 78 y.o.   MRN: [id number]  HPI CHRONIC HYPERTENSION  Disease Monitoring  Blood pressure range: 108-161/61-90  Chest pain: no   Dyspnea: no   Claudication: no  Medication compliance: yes  Medication Side Effects  Lightheadedness: no  Urinary frequency:no   Edema: no  Preventitive Healthcare:  Exercise: walking & stationary bike 30-40 min 6d/ week   Diet Pattern: no  Salt Restriction: yes      Review of Systems  His diabetes is monitored by Dr. Wilson Singer. He states Dr. Wilson Singer took him off the simvastatin & started Atorvastatin.    Objective:   Physical Exam He appears healthy and well-nourished; he is in no acute distress.Appears younger than stated age   L carotid bruit is  present.  Heart rhythm and rate are normal with Grade 1/6 systolic murmur; no gallops.  Chest is clear with no increased work of breathing  There is no evidence of aortic aneurysm or renal artery bruits  He has no clubbing or edema.   Pedal pulses are decreased  No ischemic skin changes are present        Assessment & Plan:

## 2012-03-29 NOTE — Patient Instructions (Addendum)
Blood Pressure Goal  Ideally is an AVERAGE < 135/85. This AVERAGE should be calculated from @ least 5-7 BP readings taken @ different times of day on different days of week. You should not respond to isolated BP readings , but rather the AVERAGE for that week. Review and correct the record as indicated. Please share record with Dr Wilson Singer. I shall request copies of your labs from him to include in the chart.  Consideration should be given to stopping losartan/HCT and using only plain Losartan. The HCTZ in the combination may be ineffective with kidney impairment. Present kidney function status should be verified.

## 2012-03-29 NOTE — Assessment & Plan Note (Signed)
HCTZ is not likely to be effective with elevated creatinine. I'll  request copies of the labs from Dr. Wilson Singer

## 2012-09-25 ENCOUNTER — Other Ambulatory Visit: Payer: Self-pay | Admitting: Internal Medicine

## 2012-10-31 ENCOUNTER — Encounter (HOSPITAL_COMMUNITY): Payer: Self-pay

## 2012-10-31 ENCOUNTER — Emergency Department (HOSPITAL_COMMUNITY)
Admission: EM | Admit: 2012-10-31 | Discharge: 2012-10-31 | Disposition: A | Discharge status: Home / Self Care | Payer: Medicare Other | Attending: Emergency Medicine | Admitting: Emergency Medicine

## 2012-10-31 ENCOUNTER — Emergency Department (HOSPITAL_COMMUNITY): Payer: Medicare Other

## 2012-10-31 DIAGNOSIS — Z951 Presence of aortocoronary bypass graft: Secondary | ICD-10-CM | POA: Insufficient documentation

## 2012-10-31 DIAGNOSIS — I10 Essential (primary) hypertension: Secondary | ICD-10-CM | POA: Insufficient documentation

## 2012-10-31 DIAGNOSIS — Z8679 Personal history of other diseases of the circulatory system: Secondary | ICD-10-CM | POA: Insufficient documentation

## 2012-10-31 DIAGNOSIS — Z8546 Personal history of malignant neoplasm of prostate: Secondary | ICD-10-CM | POA: Insufficient documentation

## 2012-10-31 DIAGNOSIS — E1169 Type 2 diabetes mellitus with other specified complication: Secondary | ICD-10-CM | POA: Insufficient documentation

## 2012-10-31 DIAGNOSIS — Z79899 Other long term (current) drug therapy: Secondary | ICD-10-CM | POA: Insufficient documentation

## 2012-10-31 DIAGNOSIS — I2510 Atherosclerotic heart disease of native coronary artery without angina pectoris: Secondary | ICD-10-CM | POA: Insufficient documentation

## 2012-10-31 DIAGNOSIS — Z7982 Long term (current) use of aspirin: Secondary | ICD-10-CM | POA: Insufficient documentation

## 2012-10-31 DIAGNOSIS — Z87448 Personal history of other diseases of urinary system: Secondary | ICD-10-CM | POA: Insufficient documentation

## 2012-10-31 DIAGNOSIS — Z8719 Personal history of other diseases of the digestive system: Secondary | ICD-10-CM | POA: Insufficient documentation

## 2012-10-31 DIAGNOSIS — Z8669 Personal history of other diseases of the nervous system and sense organs: Secondary | ICD-10-CM | POA: Insufficient documentation

## 2012-10-31 DIAGNOSIS — E162 Hypoglycemia, unspecified: Secondary | ICD-10-CM

## 2012-10-31 DIAGNOSIS — Z794 Long term (current) use of insulin: Secondary | ICD-10-CM | POA: Insufficient documentation

## 2012-10-31 DIAGNOSIS — Z8739 Personal history of other diseases of the musculoskeletal system and connective tissue: Secondary | ICD-10-CM | POA: Insufficient documentation

## 2012-10-31 LAB — CBC WITH DIFFERENTIAL/PLATELET
Basophils Absolute: 0 10*3/uL (ref 0.0–0.1)
HCT: 39.3 % (ref 39.0–52.0)
Hemoglobin: 13.5 g/dL (ref 13.0–17.0)
Lymphocytes Relative: 24 % (ref 12–46)
Lymphs Abs: 1.5 10*3/uL (ref 0.7–4.0)
Monocytes Absolute: 0.4 10*3/uL (ref 0.1–1.0)
Monocytes Relative: 7 % (ref 3–12)
Neutro Abs: 4 10*3/uL (ref 1.7–7.7)
RBC: 4.62 MIL/uL (ref 4.22–5.81)
RDW: 14.8 % (ref 11.5–15.5)
WBC: 6 10*3/uL (ref 4.0–10.5)

## 2012-10-31 LAB — COMPREHENSIVE METABOLIC PANEL
AST: 19 U/L (ref 0–37)
CO2: 25 mEq/L (ref 19–32)
Chloride: 100 mEq/L (ref 96–112)
Creatinine, Ser: 1.49 mg/dL — ABNORMAL HIGH (ref 0.50–1.35)
GFR calc non Af Amer: 40 mL/min — ABNORMAL LOW (ref >90)
Glucose, Bld: 181 mg/dL — ABNORMAL HIGH (ref 70–99)
Total Bilirubin: 0.4 mg/dL (ref 0.3–1.2)

## 2012-10-31 LAB — POCT I-STAT, CHEM 8
Creatinine, Ser: 1.4 mg/dL — ABNORMAL HIGH (ref 0.50–1.35)
Glucose, Bld: 175 mg/dL — ABNORMAL HIGH (ref 70–99)
Hemoglobin: 14.3 g/dL (ref 13.0–17.0)
Sodium: 138 mEq/L (ref 135–145)
TCO2: 27 mmol/L (ref 0–100)

## 2012-10-31 LAB — POCT I-STAT TROPONIN I

## 2012-10-31 MED ORDER — DEXTROSE 50 % IV SOLN
50.0000 mL | Freq: Once | INTRAVENOUS | Status: AC
  Administered 2012-10-31: 50 mL via INTRAVENOUS
  Filled 2012-10-31: qty 50

## 2012-10-31 MED ORDER — SODIUM CHLORIDE 0.9 % IV SOLN
INTRAVENOUS | Status: AC
  Filled 2012-10-31: qty 100

## 2012-10-31 MED ORDER — SODIUM CHLORIDE 0.9 % IV BOLUS (SEPSIS)
1000.0000 mL | Freq: Once | INTRAVENOUS | Status: AC
  Administered 2012-10-31: 1000 mL via INTRAVENOUS

## 2012-10-31 MED ORDER — SODIUM CHLORIDE 0.9 % IV SOLN
1.0000 g | Freq: Once | INTRAVENOUS | Status: AC
  Administered 2012-10-31: 1 g via INTRAVENOUS

## 2012-10-31 MED ORDER — CALCIUM GLUCONATE 10 % IV SOLN
INTRAVENOUS | Status: AC
  Filled 2012-10-31: qty 10

## 2012-10-31 NOTE — ED Notes (Signed)
MD at bedside. 

## 2012-10-31 NOTE — ED Provider Notes (Signed)
History     CSN: [id number]  Arrival date & time 10/31/12  1709   First MD Initiated Contact with Patient 10/31/12 1710      No chief complaint on file.   (Consider location/radiation/quality/duration/timing/severity/associated sxs/prior treatment) HPI Comments: 79 y.o. male who presents to the ER w/ the cc of weakness. Pt was driving care, and he states things became "hazy", and he got "confused". EMS states that pt's POC glucose was in the 40s, and he was given D50 with improvement in his mental status. Pt states he is feeling better now.   Patient is a 79 y.o. male presenting with general illness. The history is provided by the patient and the EMS personnel.  Illness Severity:  Severe Onset quality:  Gradual Timing:  Constant Progression:  Improving Chronicity:  New Associated symptoms: no abdominal pain, no chest pain and no congestion     Past Medical History  Diagnosis Date  . Hypertension   . Prostate cancer 1996    s/p external radiation  . Carotid stenosis, bilateral per doppler 08-20-09    mild  . DDD (degenerative disc disease), lumbar     s/p fusion 4-5  . Renal insufficiency hx  . Coronary artery disease ?date last stress test    hx cabg and redo-- last visit cardiologist dr Olevia Perches 6 yrs ago, sees pcp dr Gwyndolyn Saxon hopper  . Diabetic peripheral neuropathy     feet  . Diabetes mellitus     oral med and insuin  . Bladder tumor     hematuria  . Nocturia   . Glaucoma(365) bilateral     eye drop rx  . HOH (hard of hearing) no hearing aids  . History of hiatal hernia     denies reflux  . Syncope 06/06/2011    In context of dehydration and bradycardia.    Past Surgical History  Procedure Laterality Date  . Upper gastrointestinal endoscopy      dx hiatal hernia  . Lumbar fusion  01/2004    L 4-5  . Coronary artery bypass graft  1984    4 vessels  . Coronary artery bypass graft  1997- redo    3 vessels  . Lipoma excision  1970's    from back  .  Cataract extraction w/ intraocular lens  implant, bilateral    . Cardiac catheterization  last cath 2005    per dr Olevia Perches- w/ chart (grafts patent)-  results w/ chart  . Transurethral resection of bladder tumor  04/16/2011    Procedure: TRANSURETHRAL RESECTION OF BLADDER TUMOR (TURBT);  Surgeon: Claybon Jabs;  Location: [facility];  Service: Urology;  Laterality: N/A;    No family history on file.  History  Substance Use Topics  . Smoking status: Never Smoker   . Smokeless tobacco: Never Used  . Alcohol Use: No      Review of Systems  Unable to perform ROS: Acuity of condition  HENT: Negative for congestion.   Cardiovascular: Negative for chest pain.  Gastrointestinal: Negative for abdominal pain.    Allergies  Codeine and Ace inhibitors  Home Medications   Current Outpatient Rx  Name  Route  Sig  Dispense  Refill  . amLODipine (NORVASC) 10 MG tablet      TAKE 1 TABLET EVERY DAY   90 tablet   1   . aspirin 81 MG tablet   Oral   Take 81 mg by mouth daily.          Marland Kitchen  atorvastatin (LIPITOR) 40 MG tablet   Oral   Take 40 mg by mouth daily.         . B-D ULTRAFINE III SHORT PEN 31G X 8 MM MISC               . bimatoprost (LUMIGAN) 0.03 % ophthalmic drops   Both Eyes   Place 1 drop into both eyes at bedtime.          . fish oil-omega-3 fatty acids 1000 MG capsule   Oral   Take 1 g by mouth 2 (two) times daily.          Marland Kitchen EXPIRED: fluticasone (FLONASE) 50 MCG/ACT nasal spray   Nasal   Place 2 sprays into the nose daily.   16 g   2   . gabapentin (NEURONTIN) 600 MG tablet   Oral   Take 600 mg by mouth 3 (three) times daily.           . insulin glargine (LANTUS) 100 UNIT/ML injection   Subcutaneous   Inject 10 Units into the skin 2 (two) times daily. (Sliding Scale)         . insulin lispro (HUMALOG PEN) 100 UNIT/ML injection   Subcutaneous   Inject into the skin every morning. 10 units am, Lunch Sliding scale, 10 units  pm  (Sliding Scale)         . insulin lispro (HUMALOG) 100 UNIT/ML injection   Subcutaneous   Inject 6-10 Units into the skin 2 (two) times daily before lunch and supper. Per sliding scale.         . losartan-hydrochlorothiazide (HYZAAR) 100-25 MG per tablet      TAKE 1 TABLET BY MOUTH DAILY.   90 tablet   1   . nitroGLYCERIN (NITROSTAT) 0.4 MG SL tablet   Sublingual   Place 0.4 mg under the tongue every 5 (five) minutes as needed. For chest pain. Patient out of Rx          . potassium chloride SA (K-DUR,KLOR-CON) 20 MEQ tablet   Oral   Take 20 mEq by mouth 2 (two) times daily.         . simvastatin (ZOCOR) 40 MG tablet   Oral   Take 40 mg by mouth at bedtime.           . simvastatin (ZOCOR) 40 MG tablet      TAKE 1 TABLET (40 MG TOTAL) BY MOUTH AT BEDTIME.   90 tablet   0   . sitaGLIPtan (JANUVIA) 100 MG tablet   Oral   Take 50 mg by mouth daily.          . Tamsulosin HCl (FLOMAX) 0.4 MG CAPS   Oral   Take 0.4 mg by mouth daily.           There were no vitals taken for this visit.  Physical Exam  Constitutional: He is oriented to person, place, and time. He appears well-developed. No distress.  HENT:  Head: Normocephalic.  Eyes: Pupils are equal, round, and reactive to light. Right eye exhibits no discharge. Left eye exhibits no discharge.  Neck: Neck supple. No tracheal deviation present.  Cardiovascular: Regular rhythm and intact distal pulses.   Pulmonary/Chest: Effort normal. No stridor. No respiratory distress. He has no wheezes.  Abdominal: Soft. He exhibits no distension. There is no tenderness. There is no rebound.  Musculoskeletal: Normal range of motion. He exhibits no tenderness.  Neurological: He is alert and oriented  to person, place, and time. No cranial nerve deficit.  Skin: Skin is warm. No rash noted. He is not diaphoretic. No erythema.    ED Course  Procedures (including critical care time)  Labs Reviewed  CBC WITH  DIFFERENTIAL - Abnormal; Notable for the following:    Platelets 121 (*)    All other components within normal limits  COMPREHENSIVE METABOLIC PANEL - Abnormal; Notable for the following:    Glucose, Bld 181 (*)    BUN 27 (*)    Creatinine, Ser 1.49 (*)    Calcium 8.3 (*)    GFR calc non Af Amer 40 (*)    GFR calc Af Amer 46 (*)    All other components within normal limits  GLUCOSE, CAPILLARY - Abnormal; Notable for the following:    Glucose-Capillary 142 (*)    All other components within normal limits  GLUCOSE, CAPILLARY - Abnormal; Notable for the following:    Glucose-Capillary 261 (*)    All other components within normal limits  POCT I-STAT, CHEM 8 - Abnormal; Notable for the following:    BUN 29 (*)    Creatinine, Ser 1.40 (*)    Glucose, Bld 175 (*)    Calcium, Ion 1.11 (*)    All other components within normal limits  GLUCOSE, CAPILLARY  POCT I-STAT TROPONIN I   Dg Chest Portable 1 View  10/31/2012   *RADIOLOGY REPORT*  Clinical Data: Confusion.  Diabetes.  Query pneumonia.  PORTABLE CHEST - 1 VIEW  Comparison: 08/19/2011  Findings: Prior CABG noted with borderline cardiomegaly.  No airspace opacity to suggest pneumonia.  No pleural effusion observed.  IMPRESSION:  1.  No specific findings of pneumonia.   Original Report Authenticated By: Van Clines, M.D.     Date: 10/31/2012  Rate: 55  Rhythm: sinus bradycardia  QRS Axis: left  Intervals: normal  ST/T Wave abnormalities: nonspecific ST changes  Conduction Disutrbances:nonspecific intraventricular conduction delay  Narrative Interpretation:   Old EKG Reviewed: changes noted, ecg from 10/07/2011 shows sinus bradycardia as well, QRS is slightly wider than prior ECG.    MDM  Pt with hypoglycemia and also found to be bradycardic by EMS. He has improvement in mental status w/ D50 given by EMS. Pt is is alert and orientated right now. QRS is slightly widened from prior, and in setting of bradycardia, and w/ suspected  worsening renal disease (pt has had elevated Cr prior), given calcium gluconate as well for concern of possible hyperK. Will check labs, get poc glucose.   Pt's Cr is at baseline. The rest of pt's labs are at baseline. After further discussion w/ pt and his family, he took his insulin sliding scale before his lunch meal early today because his wife had an appointment she had to go to. He states he usually takes this insulin 15 minutes before meal, but states it had been more than an hour before his meal this time. Suspect this was the reason for his hypoglycemia. He has been evaluated for more than 4 hours in the Er, an no repeat of his hypoglycemia. He is doing well, states he can see his pcp tomorrow, and he has grandson that will check in on patient to continue to make sure he is doing well.   1. Hypoglycemia            Boyd Buffalo Lysle Rubens, MD 11/01/12 0001

## 2012-10-31 NOTE — ED Notes (Signed)
The patient is AOx4 and comfortable with his discharge instructions.
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
## 2012-10-31 NOTE — ED Notes (Signed)
Checked patient cbg it was 24 notified RN Magda Paganini of blood sugar

## 2012-11-02 ENCOUNTER — Encounter: Payer: Self-pay | Admitting: Internal Medicine

## 2012-11-02 ENCOUNTER — Ambulatory Visit (INDEPENDENT_AMBULATORY_CARE_PROVIDER_SITE_OTHER): Payer: Medicare Other | Admitting: Internal Medicine

## 2012-11-02 DIAGNOSIS — Z5189 Encounter for other specified aftercare: Secondary | ICD-10-CM

## 2012-11-02 NOTE — Progress Notes (Signed)
  Subjective:    Patient ID: Leonard [name], male    DOB: 02-09-1921, 79 y.o.   MRN: [id number]  HPI  He received D50 in the emergency room 10/31/12 following an acute hypoglycemic episode about 4 pm with glucoses in the 40 associated with mental status changes and erratic driving.  He had taken 8 units of insulin with his lunch that day.  He is an insulin-dependent diabetic and followed by Dr. Wilson Singer, endocrinologist. He is on Lantus 10 units twice a day as well as Humalog 10 units with breakfast and evening meal. He takes a sliding scale for lunch.    Review of Systems   In the emergency room cardiac enzymes were negative. His creatinine which is chronically mildly elevated  was 1.49. GFR was 46.     Objective:   Physical Exam   He appears well-nourished and in no distress. He appears much younger than his stated age  Left carotid bruit is present  He has a grade 1 systolic murmur; there is a rare premature beat.  Deep tendon reflexes, strength, and tone are normal.  He has degenerative changes in both hands and post amputation changes on the left.  He is alert and oriented x3     Assessment & Plan:  #1 symptomatic hypoglycemia requiring intervention. This was associated with erratic driving.  Plan: He should not drive until he is seen by his endocrinologist and the insulin regimen can be reassessed.

## 2012-11-02 NOTE — ED Provider Notes (Signed)
I saw and evaluated the patient, reviewed the resident's note and I agree with the findings and plan. Agree with EKG interpretation if present.   Pt with transient hypoglycemia due to taking insulin at different time from usual. Feeling better in the ED, no recurrent hypoglycemia episodes, eating and rinking and wants to go home.   Charles B. Karle Starch, MD 11/02/12 [phone]

## 2012-11-02 NOTE — Patient Instructions (Addendum)
Share records  with  Dr Wilson Singer

## 2013-01-09 ENCOUNTER — Encounter (HOSPITAL_COMMUNITY): Payer: Self-pay | Admitting: *Deleted

## 2013-01-09 ENCOUNTER — Ambulatory Visit (HOSPITAL_COMMUNITY): Payer: Medicare Other | Attending: Emergency Medicine

## 2013-01-09 ENCOUNTER — Emergency Department (INDEPENDENT_AMBULATORY_CARE_PROVIDER_SITE_OTHER)
Admission: EM | Admit: 2013-01-09 | Discharge: 2013-01-09 | Disposition: A | Discharge status: Home / Self Care | Payer: Medicare Other | Source: Home / Self Care

## 2013-01-09 DIAGNOSIS — M545 Low back pain, unspecified: Secondary | ICD-10-CM | POA: Insufficient documentation

## 2013-01-09 DIAGNOSIS — W19XXXA Unspecified fall, initial encounter: Secondary | ICD-10-CM

## 2013-01-09 DIAGNOSIS — R109 Unspecified abdominal pain: Secondary | ICD-10-CM | POA: Insufficient documentation

## 2013-01-09 DIAGNOSIS — M25559 Pain in unspecified hip: Secondary | ICD-10-CM | POA: Insufficient documentation

## 2013-01-09 DIAGNOSIS — M549 Dorsalgia, unspecified: Secondary | ICD-10-CM

## 2013-01-09 DIAGNOSIS — M87059 Idiopathic aseptic necrosis of unspecified femur: Secondary | ICD-10-CM | POA: Insufficient documentation

## 2013-01-09 MED ORDER — TRAMADOL HCL 50 MG PO TABS
50.0000 mg | ORAL_TABLET | Freq: Four times a day (QID) | ORAL | Status: DC | PRN
Start: 2013-01-09 — End: 2014-11-28

## 2013-01-09 NOTE — ED Notes (Signed)
No xray tech after 1800.  Xray dept at the hospital called by [place] Shannon CMA to notify them that they were getting this pt. lumbar spine and bil. hips and pelvis and one other pt.  Pt. assisted to W/C and assisted into shuttle.  Daughter going with pt.  Pt.'s wife to wait in our waiting room with another family member.
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
## 2013-01-10 NOTE — ED Provider Notes (Signed)
Medical screening examination/treatment/procedure(s) were performed by a resident physician or non-physician practitioner and as the supervising physician I was immediately available for consultation/collaboration.  Lynne Leader, MD   Gregor Hams, MD 01/10/13 [phone]

## 2013-01-17 ENCOUNTER — Encounter: Payer: Self-pay | Admitting: Internal Medicine

## 2013-01-20 ENCOUNTER — Telehealth (HOSPITAL_COMMUNITY): Payer: Self-pay | Admitting: Emergency Medicine

## 2013-01-20 NOTE — ED Notes (Signed)
Patient requesting refill of tramadol.  Patient states his "diabetic doctor" gave him hydrocodone for his continued back pain but that made him hallucinate.  Dr Juventino Slovak reviewed chart and approved refill for Tramadol 50mg .  1 every 6 hours qty 20.  Called in to [facility]

## 2013-01-24 ENCOUNTER — Ambulatory Visit (INDEPENDENT_AMBULATORY_CARE_PROVIDER_SITE_OTHER): Payer: Medicare Other | Admitting: Internal Medicine

## 2013-01-24 ENCOUNTER — Encounter: Payer: Self-pay | Admitting: Internal Medicine

## 2013-01-24 VITALS — BP 130/70 | HR 63 | Temp 98.0°F | Wt 154.6 lb

## 2013-01-24 DIAGNOSIS — M549 Dorsalgia, unspecified: Secondary | ICD-10-CM

## 2013-01-24 DIAGNOSIS — M810 Age-related osteoporosis without current pathological fracture: Secondary | ICD-10-CM

## 2013-01-24 MED ORDER — FENTANYL 25 MCG/HR TD PT72: 1.0000 | MEDICATED_PATCH | TRANSDERMAL | Status: DC

## 2013-01-24 NOTE — Patient Instructions (Addendum)
Physical therapy we'll contact you to schedule appointment.
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
## 2013-01-30 ENCOUNTER — Telehealth: Payer: Self-pay | Admitting: Internal Medicine

## 2013-01-30 ENCOUNTER — Other Ambulatory Visit: Payer: Self-pay | Admitting: General Practice

## 2013-01-30 MED ORDER — FENTANYL 12 MCG/HR TD PT72: 1.0000 | MEDICATED_PATCH | TRANSDERMAL | Status: DC

## 2013-01-30 MED ORDER — ONDANSETRON HCL 4 MG PO TABS: 4.0000 mg | ORAL_TABLET | Freq: Three times a day (TID) | ORAL | Status: DC | PRN

## 2013-01-30 NOTE — Telephone Encounter (Signed)
Med filled pt notified.

## 2013-01-30 NOTE — Telephone Encounter (Signed)
Spoke with pt and he advised that he has been dizzy, nauseous, cannot eat, and feels sick when he moves around. States these symptoms have persisted since Thursday of last week when he began Fentanyl patches.

## 2013-01-30 NOTE — Telephone Encounter (Signed)
Patient Information:  Caller Name: Vella Raring  Phone: [phone]  Patient: Leonard [name], Leonard [name]  Gender: Male  DOB: [birth year]  Age: 79 Years  PCP: Unice Cobble  Office Follow Up:  Does the office need to follow up with this patient?: Yes  Instructions For The Office: Please call back about Fentany patch; needs to know whether they should continue or stop the med d/t nausea  RN Note:  wife says the med says to contact MD prior to stopping it; wants to know what they should do about the med  Symptoms  Reason For Call & Symptoms: c/o nausea since he started wearing the Fentanyl patch; started wearing them 8/22 after his 8/20 appt for back pain; pain has improved a little bit, but worsened today; denies vomiting; c/o decreased appetite; drinking fluids and staying hydrated  Reviewed Health History In EMR: Yes  Reviewed Medications In EMR: Yes  Reviewed Allergies In EMR: Yes  Reviewed Surgeries / Procedures: Yes  Date of Onset of Symptoms: 01/26/2013  Guideline(s) Used:  Vomiting  No Protocol Available - Sick Adult  Disposition Per Guideline:   Discuss with PCP and Callback by Nurse Today  Reason For Disposition Reached:   Nursing judgment  Advice Given:  N/A  Patient Will Follow Care Advice:  YES

## 2013-01-30 NOTE — Telephone Encounter (Signed)
Decrease dose to 12 mcg / h ----he cannot cut patches a new rx will need to be given Can also call in zofran 4 mg #20 1 po 8h prn

## 2013-01-31 ENCOUNTER — Ambulatory Visit: Payer: Medicare Other | Admitting: Physical Therapy

## 2013-02-01 ENCOUNTER — Ambulatory Visit: Payer: Medicare Other | Attending: Internal Medicine | Admitting: Rehabilitation

## 2013-02-01 DIAGNOSIS — M545 Low back pain, unspecified: Secondary | ICD-10-CM | POA: Insufficient documentation

## 2013-02-01 DIAGNOSIS — IMO0001 Reserved for inherently not codable concepts without codable children: Secondary | ICD-10-CM | POA: Insufficient documentation

## 2013-02-02 ENCOUNTER — Telehealth: Payer: Self-pay | Admitting: Internal Medicine

## 2013-02-02 NOTE — Telephone Encounter (Signed)
Spoke with pt. He is going to try taking the zofran that was sent in and will remain on the fentanyl patch. Will call back if nausea still persists.

## 2013-02-02 NOTE — Telephone Encounter (Signed)
Pt was seen in the office on 01/25/13 for back pain.  Pt was prescribed Fentanyl patches.  Pt states since wearing the patch he has been feeling sick, nauseated.  Wife called in earlier this week and had Fentanyl patch decreased to 42mcg.  Pt has changed the patch but still has the same feelings of (sick, nauseated, etc). Pt wants to take the patch off all together and he thinks he will feel better.  Pt wanted to make sure Dr Linna Darner would be ok with that.  OFFICE, please follow up with pt to see if he can remove patch.  Does anything else need to be prescribed for pain?

## 2013-02-06 ENCOUNTER — Telehealth: Payer: Self-pay | Admitting: General Practice

## 2013-02-06 NOTE — Telephone Encounter (Signed)
Pt wife called stating that Pt was seen by [facility] before he left for trip. States that Linus Orn was completing paperwork for pt to obtain a wheelchair. Fwd message to Angie to inquire about papers.

## 2013-02-07 ENCOUNTER — Ambulatory Visit: Payer: Medicare Other | Attending: Internal Medicine | Admitting: Rehabilitation

## 2013-02-07 DIAGNOSIS — M545 Low back pain, unspecified: Secondary | ICD-10-CM | POA: Insufficient documentation

## 2013-02-07 DIAGNOSIS — IMO0001 Reserved for inherently not codable concepts without codable children: Secondary | ICD-10-CM | POA: Insufficient documentation

## 2013-02-13 ENCOUNTER — Ambulatory Visit: Payer: Medicare Other | Admitting: Rehabilitation

## 2013-02-15 ENCOUNTER — Ambulatory Visit: Payer: Medicare Other | Admitting: Rehabilitation

## 2013-03-16 ENCOUNTER — Other Ambulatory Visit: Payer: Self-pay | Admitting: Internal Medicine

## 2013-03-16 NOTE — Telephone Encounter (Signed)
Amlodipine refill sent to pharmacy 

## 2013-03-22 ENCOUNTER — Other Ambulatory Visit: Payer: Self-pay | Admitting: Internal Medicine

## 2013-03-22 NOTE — Telephone Encounter (Signed)
Losartan-HCTZ refill sent to pharmacy

## 2013-09-06 ENCOUNTER — Other Ambulatory Visit: Payer: Self-pay | Admitting: Internal Medicine

## 2013-09-24 ENCOUNTER — Other Ambulatory Visit: Payer: Self-pay | Admitting: Internal Medicine

## 2013-10-17 ENCOUNTER — Ambulatory Visit (INDEPENDENT_AMBULATORY_CARE_PROVIDER_SITE_OTHER): Payer: Medicare Other | Admitting: Internal Medicine

## 2013-10-17 ENCOUNTER — Encounter: Payer: Self-pay | Admitting: Internal Medicine

## 2013-10-17 VITALS — BP 134/70 | HR 72 | Temp 98.5°F | Wt 154.4 lb

## 2013-10-17 DIAGNOSIS — I10 Essential (primary) hypertension: Secondary | ICD-10-CM

## 2013-10-17 DIAGNOSIS — F919 Conduct disorder, unspecified: Secondary | ICD-10-CM

## 2013-10-17 DIAGNOSIS — E1165 Type 2 diabetes mellitus with hyperglycemia: Secondary | ICD-10-CM

## 2013-10-17 DIAGNOSIS — IMO0001 Reserved for inherently not codable concepts without codable children: Secondary | ICD-10-CM

## 2013-10-17 DIAGNOSIS — J310 Chronic rhinitis: Secondary | ICD-10-CM

## 2013-10-17 DIAGNOSIS — R4689 Other symptoms and signs involving appearance and behavior: Secondary | ICD-10-CM

## 2013-10-17 NOTE — Progress Notes (Signed)
Pre visit review using our clinic review tool, if applicable. No additional management support is needed unless otherwise documented below in the visit note. 

## 2013-10-17 NOTE — Progress Notes (Signed)
   Subjective:    Patient ID: Leonard [name], male    DOB: Mar 17, 1921, 80 y.o.   MRN: [id number]  HPI   His cough began 10/15/13 and has progressed. He was not exposed to any ill individuals. There is no associated pleurisy or other chest discomfort  Cough is described as dry.  He has had white/clear nasal drainage.  His cough is worse early in the morning.  Peppermint candy has helped the cough.  He had sweats but these have been intermittent for a year.  He's never smoked. No history of asthma. He is not on ACE inhibitor; he is on combination losartan/HCT. His diabetes is monitored by Dr. Wilson Singer; his fasting blood sugars have been ranging from 140-170; lunch ranges are 160-170; and dinner 160-170. Highest glucose 2 hours after meals was 315.  He has had symptomatic hypoglycemia with glucoses as low as 67. He was actually pulled over by a policeman.  Blood pressure this morning was 145/77 home.It averages 125/66. Review of Systems   He denies itchy, watery eyes.  He has no fever or chills  There is no nasal purulence or discolored sputum.  He also denies frontal headache, facial pain, sneezing, wheezing, or shortness of breath.        Objective:   Physical Exam General appearance: thin but in good health ;well nourished; no acute distress or increased work of breathing is present.  No  lymphadenopathy about the head, neck, or axilla noted.  Appears younger than stated age. Pattern alopecia  Eyes: No conjunctival inflammation or lid edema is present. There is no scleral icterus.  Ears:  External ear exam shows no significant lesions or deformities.  Otoscopic examination reveals  Wax on R. L TM scarred..  Nose:  External nasal examination shows no deformity or inflammation. Nasal mucosa are pink and moist without lesions or exudates. No septal dislocation or deviation.No obstruction to airflow.   Oral exam: Dental hygiene is good; upper partial. Lips and gums are healthy  appearing.There is no oropharyngeal erythema or exudate noted.   Neck:  No deformities, thyromegaly, masses, or tenderness noted.   Supple with full range of motion without pain.   Heart:  Normal rate and regular rhythm. S1 and S2 normal without gallop,  click, rub or other extra sounds. Grade 1/6 systolic murmur  Lungs:Chest clear to auscultation; no wheezes, rhonchi,rales ,or rubs present.No increased work of breathing.    Extremities:  No cyanosis, edema, or clubbing  noted    Skin: Warm & dry w/o jaundice or tenting.  Neuropsych:he is alert and communicative. Insight is questionable as he continues to drive despite at least three episodes of symptomatic hypoglycemia.         Assessment & Plan:  #1 rhinitis #2 HTN #3 DM with symptomsatic hypoglycemia.  #4Non-compliant/nonadherent behavior manifested as continuing to drive despite frank discussion of potential complications including loss of his life or of others. This information had been transmitted to the department of transportation previously. Alternatives such as [employer] or commercial taxicabs was discussed See orders/AVS

## 2013-10-17 NOTE — Patient Instructions (Addendum)
Plain Mucinex (NOT D) for thick secretions ;force NON dairy fluids .   Nasal cleansing in the shower as discussed with lather of mild shampoo.After 10 seconds wash off lather while  exhaling through nostrils. Make sure that all residual soap is removed to prevent irritation.  Flonase OR Nasacort AQ 1 spray in each nostril twice a day as needed. Use the "crossover" technique into opposite nostril spraying toward opposite ear @ 45 degree angle, not straight up into nostril.  Use a Neti pot daily only  as needed for significant sinus congestion; going from open side to congested side . Plain Allegra (NOT D )  160 daily , Loratidine 10 mg , OR Zyrtec 10 mg @ bedtime  as needed for itchy eyes & sneezing. I advise you NOT to drive because of the risks to you & others on the road as we discussed.

## 2013-10-18 DIAGNOSIS — R4689 Other symptoms and signs involving appearance and behavior: Secondary | ICD-10-CM | POA: Insufficient documentation

## 2013-10-22 ENCOUNTER — Encounter (HOSPITAL_COMMUNITY): Payer: Self-pay | Admitting: Emergency Medicine

## 2013-10-22 ENCOUNTER — Emergency Department (INDEPENDENT_AMBULATORY_CARE_PROVIDER_SITE_OTHER): Payer: Medicare Other

## 2013-10-22 ENCOUNTER — Emergency Department (INDEPENDENT_AMBULATORY_CARE_PROVIDER_SITE_OTHER)
Admission: EM | Admit: 2013-10-22 | Discharge: 2013-10-22 | Disposition: A | Discharge status: Home / Self Care | Payer: Medicare Other | Source: Home / Self Care

## 2013-10-22 DIAGNOSIS — J309 Allergic rhinitis, unspecified: Secondary | ICD-10-CM

## 2013-10-22 DIAGNOSIS — R0982 Postnasal drip: Secondary | ICD-10-CM

## 2013-10-22 DIAGNOSIS — R059 Cough, unspecified: Secondary | ICD-10-CM

## 2013-10-22 DIAGNOSIS — R05 Cough: Secondary | ICD-10-CM

## 2013-10-22 DIAGNOSIS — R42 Dizziness and giddiness: Secondary | ICD-10-CM

## 2013-10-22 LAB — POCT I-STAT, CHEM 8
BUN: 23 mg/dL (ref 6–23)
Calcium, Ion: 1.08 mmol/L — ABNORMAL LOW (ref 1.13–1.30)
Chloride: 101 mEq/L (ref 96–112)
Creatinine, Ser: 1.6 mg/dL — ABNORMAL HIGH (ref 0.50–1.35)
Glucose, Bld: 250 mg/dL — ABNORMAL HIGH (ref 70–99)
HCT: 45 % (ref 39.0–52.0)
Hemoglobin: 15.3 g/dL (ref 13.0–17.0)
Potassium: 4.6 mEq/L (ref 3.7–5.3)
Sodium: 136 mEq/L — ABNORMAL LOW (ref 137–147)
TCO2: 23 mmol/L (ref 0–100)

## 2013-10-22 MED ORDER — TRIAMCINOLONE ACETONIDE 40 MG/ML IJ SUSP
40.0000 mg | Freq: Once | INTRAMUSCULAR | Status: AC
  Administered 2013-10-22: 40 mg via INTRAMUSCULAR

## 2013-10-22 MED ORDER — MECLIZINE HCL 25 MG PO TABS: ORAL_TABLET | ORAL | Status: DC

## 2013-10-22 MED ORDER — TRIAMCINOLONE ACETONIDE 40 MG/ML IJ SUSP
INTRAMUSCULAR | Status: AC
  Filled 2013-10-22: qty 1

## 2013-10-22 MED ORDER — TRAMADOL HCL 50 MG PO TABS: ORAL_TABLET | ORAL | Status: DC

## 2013-10-22 NOTE — ED Notes (Addendum)
Pt  Has  Symptoms  Of  A  Productive cough  That  He  Has  Had    For  About  1  Week  -  He  Reports  Symptoms  Of  Some  dizzyness  As well        Pt  Ambulated  To  The  Exam room        He  Is  Awake  And  Alert  And       Is  In no  Acute  severe  Distress     Had  fever  Earlier  But  Is  Better  Now

## 2013-10-22 NOTE — ED Provider Notes (Signed)
CSN: [id number]     Arrival date & time 10/22/13  1327 History   First MD Initiated Contact with Patient 10/22/13 1452     Chief Complaint  Patient presents with  . Cough   (Consider location/radiation/quality/duration/timing/severity/associated sxs/prior Treatment) HPI Comments: 79 year old male is accompanied by his significant other with a one-week history of a productive cough. Over the past 2 days the production has decreased. States that he had a temperature of 101.5 2 days ago but none since. His second complaint is that of dizziness that started yesterday but is much worse today. The dizziness is constant and is exacerbated by head movement and ambulation. Denies chest pain except that which is associated with coughing.   Past Medical History  Diagnosis Date  . Hypertension   . Prostate cancer 1996    s/p external radiation  . Carotid stenosis, bilateral per doppler 08-20-09    mild  . DDD (degenerative disc disease), lumbar     s/p fusion 4-5  . Renal insufficiency hx  . Coronary artery disease ?date last stress test    hx cabg and redo-- last visit cardiologist dr Olevia Perches 6 yrs ago, sees pcp dr Gwyndolyn Saxon hopper  . Diabetic peripheral neuropathy     feet  . Diabetes mellitus     oral med and insuin  . Bladder tumor     hematuria  . Nocturia   . Glaucoma bilateral     eye drop rx  . HOH (hard of hearing) no hearing aids  . History of hiatal hernia     denies reflux  . Syncope 06/06/2011    In context of dehydration and bradycardia.   Past Surgical History  Procedure Laterality Date  . Upper gastrointestinal endoscopy      dx hiatal hernia  . Lumbar fusion  01/2004    L 4-5  . Coronary artery bypass graft  1984    4 vessels  . Coronary artery bypass graft  1997- redo    3 vessels  . Lipoma excision  1970's    from back  . Cataract extraction w/ intraocular lens  implant, bilateral    . Cardiac catheterization  last cath 2005    per dr Olevia Perches- w/ chart (grafts  patent)-  results w/ chart  . Transurethral resection of bladder tumor  04/16/2011    Procedure: TRANSURETHRAL RESECTION OF BLADDER TUMOR (TURBT);  Surgeon: Claybon Jabs;  Location: [facility];  Service: Urology;  Laterality: N/A;   History reviewed. No pertinent family history. History  Substance Use Topics  . Smoking status: Never Smoker   . Smokeless tobacco: Never Used  . Alcohol Use: No    Review of Systems  Constitutional: Positive for fever and activity change. Negative for fatigue.  HENT: Negative for congestion, ear pain, postnasal drip, rhinorrhea and sore throat.   Respiratory: Positive for cough. Negative for chest tightness, shortness of breath and wheezing.   Cardiovascular: Positive for chest pain. Negative for palpitations and leg swelling.  Gastrointestinal: Negative.   Genitourinary: Negative.   Musculoskeletal: Positive for gait problem. Negative for neck pain and neck stiffness.  Neurological: Positive for dizziness. Negative for tremors, seizures, syncope, facial asymmetry, speech difficulty and weakness.    Allergies  Codeine and Ace inhibitors  Home Medications   Prior to Admission medications   Medication Sig Start Date End Date Taking? Authorizing Provider  amLODipine (NORVASC) 10 MG tablet TAKE 1 TABLET BY MOUTH EVERY DAY    Hendricks Limes,  MD  aspirin 81 MG tablet Take 81 mg by mouth daily.     Historical Provider, MD  atorvastatin (LIPITOR) 40 MG tablet Take 40 mg by mouth daily.    Historical Provider, MD  bimatoprost (LUMIGAN) 0.03 % ophthalmic drops Place 1 drop into both eyes at bedtime.     Historical Provider, MD  Fesoterodine Fumarate (TOVIAZ PO) Take by mouth.    Historical Provider, MD  fish oil-omega-3 fatty acids 1000 MG capsule Take 1 g by mouth 2 (two) times daily.     Historical Provider, MD  gabapentin (NEURONTIN) 600 MG tablet Take 600 mg by mouth 3 (three) times daily.      Historical Provider, MD  insulin glargine  (LANTUS) 100 UNIT/ML injection Inject 10 Units into the skin 2 (two) times daily. (Sliding Scale)    Historical Provider, MD  insulin lispro (HUMALOG) 100 UNIT/ML injection Inject 6-10 Units into the skin 2 (two) times daily before lunch and supper. Per sliding scale.    Historical Provider, MD  losartan-hydrochlorothiazide (HYZAAR) 100-25 MG per tablet TAKE 1 TABLET DAILY 09/24/13   Hendricks Limes, MD  mirabegron ER (MYRBETRIQ) 25 MG TB24 tablet Take 25 mg by mouth as needed.    Historical Provider, MD  nitroGLYCERIN (NITROSTAT) 0.4 MG SL tablet Place 0.4 mg under the tongue every 5 (five) minutes as needed. For chest pain. Patient out of Rx  05/11/11   Hendricks Limes, MD  potassium chloride SA (K-DUR,KLOR-CON) 20 MEQ tablet Take 20 mEq by mouth 2 (two) times daily.    Historical Provider, MD  sitaGLIPtan (JANUVIA) 100 MG tablet Take 50 mg by mouth daily.     Historical Provider, MD  Tamsulosin HCl (FLOMAX) 0.4 MG CAPS Take 0.4 mg by mouth daily.    Historical Provider, MD  traMADol (ULTRAM) 50 MG tablet Take 1 tablet (50 mg total) by mouth every 6 (six) hours as needed for pain. 01/09/13   Freeman Caldron Baker, PA-C   BP 110/88  Pulse 70  Temp(Src) 97.6 F (36.4 C) (Oral)  Resp 16  SpO2 99% Physical Exam  Nursing note and vitals reviewed. Constitutional: He is oriented to person, place, and time. He appears well-developed and well-nourished. No distress.  HENT:  Mouth/Throat: No oropharyngeal exudate.  Bilat TM's nl OP with injection and clear PND  Eyes: EOM are normal.  bilat conjunctival redness.  Neck: Normal range of motion. Neck supple.  Cardiovascular: Normal rate, regular rhythm and intact distal pulses.   Murmur heard. Pulmonary/Chest: Effort normal. He has no wheezes. He has no rales.  Diminished expiratory BS.  Musculoskeletal: He exhibits no edema and no tenderness.  Lymphadenopathy:    He has no cervical adenopathy.  Neurological: He is alert and oriented to person, place,  and time. He exhibits normal muscle tone.  Skin: Skin is warm and dry.  Psychiatric: He has a normal mood and affect.    ED Course  Procedures (including critical care time) Labs Review Labs Reviewed  POCT I-STAT, CHEM 8 - Abnormal; Notable for the following:    Sodium 136 (*)    Creatinine, Ser 1.60 (*)    Glucose, Bld 250 (*)    Calcium, Ion 1.08 (*)    All other components within normal limits    Imaging Review Dg Chest 2 View  10/22/2013   CLINICAL DATA:  Cough.  EXAM: CHEST  2 VIEW  COMPARISON:  Oct 31, 2012.  FINDINGS: Stable cardiomegaly. Status post coronary artery bypass graft.  No pleural effusion or pneumothorax is noted. No acute pulmonary disease is noted. Mild anterior osteophyte formation is noted in lower thoracic spine.  IMPRESSION: No acute cardiopulmonary abnormality seen.   Electronically Signed   By: Sabino Dick M.D.   On: 10/22/2013 15:44     MDM   1. Cough   2. Allergic rhinitis   3. PND (post-nasal drip)   4. Dizziness    For worsening of any problem, breathing problems, worsening cough, chest pain go to the emergency dept. Call your doctor for follow up tomorrow.      Janne Napoleon, NP 10/22/13 [place], NP 10/22/13 [phone]

## 2013-10-22 NOTE — ED Provider Notes (Signed)
Medical screening examination/treatment/procedure(s) were performed by resident physician or non-physician practitioner and as supervising physician I was immediately available for consultation/collaboration.   Pauline Good MD.   Billy Fischer, MD 10/22/13 [phone]

## 2013-10-22 NOTE — Discharge Instructions (Signed)
Allergic Rhinitis Claritin 10 mg a day Robitussin DM for cough  Allergic rhinitis is when the mucous membranes in the nose respond to allergens. Allergens are particles in the air that cause your body to have an allergic reaction. This causes you to release allergic antibodies. Through a chain of events, these eventually cause you to release histamine into the blood stream. Although meant to protect the body, it is this release of histamine that causes your discomfort, such as frequent sneezing, congestion, and an itchy, runny nose.  CAUSES  Seasonal allergic rhinitis (hay fever) is caused by pollen allergens that may come from grasses, trees, and weeds. Year-round allergic rhinitis (perennial allergic rhinitis) is caused by allergens such as house dust mites, pet dander, and mold spores.  SYMPTOMS   Nasal stuffiness (congestion).  Itchy, runny nose with sneezing and tearing of the eyes. DIAGNOSIS  Your health care provider can help you determine the allergen or allergens that trigger your symptoms. If you and your health care provider are unable to determine the allergen, skin or blood testing may be used. TREATMENT  Allergic Rhinitis does not have a cure, but it can be controlled by:  Medicines and allergy shots (immunotherapy).  Avoiding the allergen. Hay fever may often be treated with antihistamines in pill or nasal spray forms. Antihistamines block the effects of histamine. There are over-the-counter medicines that may help with nasal congestion and swelling around the eyes. Check with your health care provider before taking or giving this medicine.  If avoiding the allergen or the medicine prescribed do not work, there are many new medicines your health care provider can prescribe. Stronger medicine may be used if initial measures are ineffective. Desensitizing injections can be used if medicine and avoidance does not work. Desensitization is when a patient is given ongoing shots until  the body becomes less sensitive to the allergen. Make sure you follow up with your health care provider if problems continue. HOME CARE INSTRUCTIONS It is not possible to completely avoid allergens, but you can reduce your symptoms by taking steps to limit your exposure to them. It helps to know exactly what you are allergic to so that you can avoid your specific triggers. SEEK MEDICAL CARE IF:   You have a fever.  You develop a cough that does not stop easily (persistent).  You have shortness of breath.  You start wheezing.  Symptoms interfere with normal daily activities. Document Released: 02/16/2001 Document Revised: 03/14/2013 Document Reviewed: 01/29/2013 [facility] Patient Information 2014 [place].  Dizziness Dizziness is a common problem. It is a feeling of unsteadiness or lightheadedness. You may feel like you are about to faint. Dizziness can lead to injury if you stumble or fall. A person of any age group can suffer from dizziness, but dizziness is more common in older adults. CAUSES  Dizziness can be caused by many different things, including:  Middle ear problems.  Standing for too long.  Infections.  An allergic reaction.  Aging.  An emotional response to something, such as the sight of blood.  Side effects of medicines.  Fatigue.  Problems with circulation or blood pressure.  Excess use of alcohol, medicines, or illegal drug use.  Breathing too fast (hyperventilation).  An arrhythmia or problems with your heart rhythm.  Low red blood cell count (anemia).  Pregnancy.  Vomiting, diarrhea, fever, or other illnesses that cause dehydration.  Diseases or conditions such as Parkinson's disease, high blood pressure (hypertension), diabetes, and thyroid problems.  Exposure to  extreme heat. DIAGNOSIS  To find the cause of your dizziness, your caregiver may do a physical exam, lab tests, radiologic imaging scans, or an electrocardiography test (ECG).    TREATMENT  Treatment of dizziness depends on the cause of your symptoms and can vary greatly. HOME CARE INSTRUCTIONS   Drink enough fluids to keep your urine clear or pale yellow. This is especially important in very hot weather. In the elderly, it is also important in cold weather.  If your dizziness is caused by medicines, take them exactly as directed. When taking blood pressure medicines, it is especially important to get up slowly.  Rise slowly from chairs and steady yourself until you feel okay.  In the morning, first sit up on the side of the bed. When this seems okay, stand slowly while holding onto something until you know your balance is fine.  If you need to stand in one place for a long time, be sure to move your legs often. Tighten and relax the muscles in your legs while standing.  If dizziness continues to be a problem, have someone stay with you for a day or two. Do this until you feel you are well enough to stay alone. Have the person call your caregiver if he or she notices changes in you that are concerning.  Do not drive or use heavy machinery if you feel dizzy.  Do not drink alcohol. SEEK IMMEDIATE MEDICAL CARE IF:   Your dizziness or lightheadedness gets worse.  You feel nauseous or vomit.  You develop problems with talking, walking, weakness, or using your arms, hands, or legs.  You are not thinking clearly or you have difficulty forming sentences. It may take a friend or family member to determine if your thinking is normal.  You develop chest pain, abdominal pain, shortness of breath, or sweating.  Your vision changes.  You notice any bleeding.  You have side effects from medicine that seems to be getting worse rather than better. MAKE SURE YOU:   Understand these instructions.  Will watch your condition.  Will get help right away if you are not doing well or get worse. Document Released: 11/17/2000 Document Revised: 08/16/2011 Document Reviewed:  12/11/2010 [facility] Patient Information 2014 [place], [place].

## 2014-03-06 ENCOUNTER — Other Ambulatory Visit: Payer: Self-pay | Admitting: Internal Medicine

## 2014-03-18 ENCOUNTER — Other Ambulatory Visit: Payer: Self-pay | Admitting: Internal Medicine

## 2014-06-06 ENCOUNTER — Encounter (HOSPITAL_COMMUNITY): Payer: Self-pay | Admitting: *Deleted

## 2014-06-06 ENCOUNTER — Emergency Department (INDEPENDENT_AMBULATORY_CARE_PROVIDER_SITE_OTHER)
Admission: EM | Admit: 2014-06-06 | Discharge: 2014-06-06 | Disposition: A | Discharge status: Home / Self Care | Payer: Medicare Other | Source: Home / Self Care | Attending: Emergency Medicine | Admitting: Emergency Medicine

## 2014-06-06 DIAGNOSIS — M545 Low back pain, unspecified: Secondary | ICD-10-CM

## 2014-06-06 MED ORDER — ONDANSETRON 4 MG PO TBDP: 4.0000 mg | ORAL_TABLET | Freq: Four times a day (QID) | ORAL | Status: DC | PRN

## 2014-06-06 MED ORDER — HYDROCODONE-ACETAMINOPHEN 5-325 MG PO TABS
1.0000 | ORAL_TABLET | Freq: Four times a day (QID) | ORAL | Status: DC | PRN
Start: 2014-06-06 — End: 2015-11-28

## 2014-06-06 NOTE — Discharge Instructions (Signed)
Chronic Back Pain  When back pain lasts longer than 3 months, it is called chronic back pain.People with chronic back pain often go through certain periods that are more intense (flare-ups).  CAUSES Chronic back pain can be caused by wear and tear (degeneration) on different structures in your back. These structures include:  The bones of your spine (vertebrae) and the joints surrounding your spinal cord and nerve roots (facets).  The strong, fibrous tissues that connect your vertebrae (ligaments). Degeneration of these structures may result in pressure on your nerves. This can lead to constant pain. HOME CARE INSTRUCTIONS  Avoid bending, heavy lifting, prolonged sitting, and activities which make the problem worse.  Take brief periods of rest throughout the day to reduce your pain. Lying down or standing usually is better than sitting while you are resting.  Take over-the-counter or prescription medicines only as directed by your caregiver. SEEK IMMEDIATE MEDICAL CARE IF:   You have weakness or numbness in one of your legs or feet.  You have trouble controlling your bladder or bowels.  You have nausea, vomiting, abdominal pain, shortness of breath, or fainting. Document Released: 07/01/2004 Document Revised: 08/16/2011 Document Reviewed: 05/08/2011 [facility] Patient Information 2015 [place], [place]. This information is not intended to replace advice given to you by your health care provider. Make sure you discuss any questions you have with your health care provider.  Back Pain, Adult Low back pain is very common. About 1 in 5 people have back pain.The cause of low back pain is rarely dangerous. The pain often gets better over time.About half of people with a sudden onset of back pain feel better in just 2 weeks. About 8 in 10 people feel better by 6 weeks.  CAUSES Some common causes of back pain include:  Strain of the muscles or ligaments supporting the spine.  Wear and tear  (degeneration) of the spinal discs.  Arthritis.  Direct injury to the back. DIAGNOSIS Most of the time, the direct cause of low back pain is not known.However, back pain can be treated effectively even when the exact cause of the pain is unknown.Answering your caregiver's questions about your overall health and symptoms is one of the most accurate ways to make sure the cause of your pain is not dangerous. If your caregiver needs more information, he or she may order lab work or imaging tests (X-rays or MRIs).However, even if imaging tests show changes in your back, this usually does not require surgery. HOME CARE INSTRUCTIONS For many people, back pain returns.Since low back pain is rarely dangerous, it is often a condition that people can learn to [facility] their own.   Remain active. It is stressful on the back to sit or stand in one place. Do not sit, drive, or stand in one place for more than 30 minutes at a time. Take short walks on level surfaces as soon as pain allows.Try to increase the length of time you walk each day.  Do not stay in bed.Resting more than 1 or 2 days can delay your recovery.  Do not avoid exercise or work.Your body is made to move.It is not dangerous to be active, even though your back may hurt.Your back will likely heal faster if you return to being active before your pain is gone.  Pay attention to your body when you bend and lift. Many people have less discomfortwhen lifting if they bend their knees, keep the load close to their bodies,and avoid twisting. Often, the most comfortable  positions are those that put less stress on your recovering back.  Find a comfortable position to sleep. Use a firm mattress and lie on your side with your knees slightly bent. If you lie on your back, put a pillow under your knees.  Only take over-the-counter or prescription medicines as directed by your caregiver. Over-the-counter medicines to reduce pain and inflammation  are often the most helpful.Your caregiver may prescribe muscle relaxant drugs.These medicines help dull your pain so you can more quickly return to your normal activities and healthy exercise.  Put ice on the injured area.  Put ice in a plastic bag.  Place a towel between your skin and the bag.  Leave the ice on for 15-20 minutes, 03-04 times a day for the first 2 to 3 days. After that, ice and heat may be alternated to reduce pain and spasms.  Ask your caregiver about trying back exercises and gentle massage. This may be of some benefit.  Avoid feeling anxious or stressed.Stress increases muscle tension and can worsen back pain.It is important to recognize when you are anxious or stressed and learn ways to manage it.Exercise is a great option. SEEK MEDICAL CARE IF:  You have pain that is not relieved with rest or medicine.  You have pain that does not improve in 1 week.  You have new symptoms.  You are generally not feeling well. SEEK IMMEDIATE MEDICAL CARE IF:   You have pain that radiates from your back into your legs.  You develop new bowel or bladder control problems.  You have unusual weakness or numbness in your arms or legs.  You develop nausea or vomiting.  You develop abdominal pain.  You feel faint. Document Released: 05/24/2005 Document Revised: 11/23/2011 Document Reviewed: 09/25/2013 [facility] Patient Information 2015 [place], [place]. This information is not intended to replace advice given to you by your health care provider. Make sure you discuss any questions you have with your health care provider.

## 2014-06-06 NOTE — ED Provider Notes (Signed)
CSN: [id number]     Arrival date & time 06/06/14  1300 History   First MD Initiated Contact with Patient 06/06/14 1404     Chief Complaint  Patient presents with  . Back Pain   (Consider location/radiation/quality/duration/timing/severity/associated sxs/prior Treatment) HPI           80 year old male with history of chronic back pain presents complaining of back pain. He has pain in his lower back for the past few days. This started after he was bending and trying to clean his floor. He had mild pain that day but woke up with severe pain in the next day. This pain has persisted for the past few days. It is worse with any movement. He denies any extremity numbness or weakness. He denies any specific injury. Denies any loss of bowel or bladder control, genital numbness.    Past Medical History  Diagnosis Date  . Hypertension   . Prostate cancer 1996    s/p external radiation  . Carotid stenosis, bilateral per doppler 08-20-09    mild  . DDD (degenerative disc disease), lumbar     s/p fusion 4-5  . Renal insufficiency hx  . Coronary artery disease ?date last stress test    hx cabg and redo-- last visit cardiologist dr Olevia Perches 6 yrs ago, sees pcp dr Gwyndolyn Saxon hopper  . Diabetic peripheral neuropathy     feet  . Diabetes mellitus     oral med and insuin  . Bladder tumor     hematuria  . Nocturia   . Glaucoma bilateral     eye drop rx  . HOH (hard of hearing) no hearing aids  . History of hiatal hernia     denies reflux  . Syncope 06/06/2011    In context of dehydration and bradycardia.   Past Surgical History  Procedure Laterality Date  . Upper gastrointestinal endoscopy      dx hiatal hernia  . Lumbar fusion  01/2004    L 4-5  . Coronary artery bypass graft  1984    4 vessels  . Coronary artery bypass graft  1997- redo    3 vessels  . Lipoma excision  1970's    from back  . Cataract extraction w/ intraocular lens  implant, bilateral    . Cardiac catheterization  last cath  2005    per dr Olevia Perches- w/ chart (grafts patent)-  results w/ chart  . Transurethral resection of bladder tumor  04/16/2011    Procedure: TRANSURETHRAL RESECTION OF BLADDER TUMOR (TURBT);  Surgeon: Claybon Jabs;  Location: [facility];  Service: Urology;  Laterality: N/A;   History reviewed. No pertinent family history. History  Substance Use Topics  . Smoking status: Never Smoker   . Smokeless tobacco: Never Used  . Alcohol Use: No    Review of Systems  Musculoskeletal: Positive for back pain.  Neurological: Negative for weakness and numbness.  All other systems reviewed and are negative.   Allergies  Codeine and Ace inhibitors  Home Medications   Prior to Admission medications   Medication Sig Start Date End Date Taking? Authorizing Provider  amLODipine (NORVASC) 10 MG tablet TAKE 1 TABLET BY MOUTH EVERY DAY    Hendricks Limes, MD  amLODipine (NORVASC) 10 MG tablet TAKE 1 TABLET BY MOUTH EVERY DAY 03/06/14   Hendricks Limes, MD  aspirin 81 MG tablet Take 81 mg by mouth daily.     Historical Provider, MD  atorvastatin (LIPITOR) 40 MG  tablet Take 40 mg by mouth daily.    Historical Provider, MD  bimatoprost (LUMIGAN) 0.03 % ophthalmic drops Place 1 drop into both eyes at bedtime.     Historical Provider, MD  Fesoterodine Fumarate (TOVIAZ PO) Take by mouth.    Historical Provider, MD  fish oil-omega-3 fatty acids 1000 MG capsule Take 1 g by mouth 2 (two) times daily.     Historical Provider, MD  gabapentin (NEURONTIN) 600 MG tablet Take 600 mg by mouth 3 (three) times daily.      Historical Provider, MD  HYDROcodone-acetaminophen (NORCO) 5-325 MG per tablet Take 1 tablet by mouth every 6 (six) hours as needed for moderate pain. 06/06/14   Freeman Caldron Nazyia Gaugh, PA-C  insulin glargine (LANTUS) 100 UNIT/ML injection Inject 10 Units into the skin 2 (two) times daily. (Sliding Scale)    Historical Provider, MD  insulin lispro (HUMALOG) 100 UNIT/ML injection Inject 6-10 Units  into the skin 2 (two) times daily before lunch and supper. Per sliding scale.    Historical Provider, MD  losartan-hydrochlorothiazide [facility]) 100-25 MG per tablet TAKE 1 TABLET DAILY 03/18/14   Hendricks Limes, MD  meclizine (ANTIVERT) 25 MG tablet 1/2 tablet q 6-8h prn dizziness. May cause drowsiness. 10/22/13   Janne Napoleon, NP  mirabegron ER (MYRBETRIQ) 25 MG TB24 tablet Take 25 mg by mouth as needed.    Historical Provider, MD  nitroGLYCERIN (NITROSTAT) 0.4 MG SL tablet Place 0.4 mg under the tongue every 5 (five) minutes as needed. For chest pain. Patient out of Rx  05/11/11   Hendricks Limes, MD  ondansetron (ZOFRAN-ODT) 4 MG disintegrating tablet Take 1 tablet (4 mg total) by mouth every 6 (six) hours as needed for nausea. PRN for nausea or vomiting 06/06/14   Liam Graham, PA-C  potassium chloride SA (K-DUR,KLOR-CON) 20 MEQ tablet Take 20 mEq by mouth 2 (two) times daily.    Historical Provider, MD  sitaGLIPtan (JANUVIA) 100 MG tablet Take 50 mg by mouth daily.     Historical Provider, MD  Tamsulosin HCl (FLOMAX) 0.4 MG CAPS Take 0.4 mg by mouth daily.    Historical Provider, MD  traMADol (ULTRAM) 50 MG tablet Take 1 tablet (50 mg total) by mouth every 6 (six) hours as needed for pain. 01/09/13   Liam Graham, PA-C  traMADol (ULTRAM) 50 MG tablet One tablet every 4-6h prn cough 10/22/13   Janne Napoleon, NP   BP 157/74 mmHg  Pulse 65  Temp(Src) 98.5 F (36.9 C) (Oral)  Resp 20  SpO2 98% Physical Exam  Constitutional: He is oriented to person, place, and time. He appears well-developed and well-nourished. No distress.  HENT:  Head: Normocephalic.  Pulmonary/Chest: Effort normal. No respiratory distress.  Abdominal: Soft. He exhibits no pulsatile midline mass and no mass. There is no tenderness.  Musculoskeletal:       Thoracic back: Normal.       Lumbar back: He exhibits decreased range of motion and tenderness (left side paraspinous musculature). He exhibits no bony tenderness, no  swelling, no deformity and no pain.  Neurological: He is alert and oriented to person, place, and time. He has normal strength and normal reflexes. No sensory deficit. He exhibits normal muscle tone. Gait (antalgic) abnormal. Coordination normal.  Skin: Skin is warm and dry. No rash noted. He is not diaphoretic.  Psychiatric: He has a normal mood and affect. Judgment normal.  Nursing note and vitals reviewed.   ED Course  Procedures (including critical  care time) Labs Review Labs Reviewed - No data to display  Imaging Review No results found.   MDM   1. Bilateral low back pain without sciatica    No red flags. Treat symptomatically with Norco and Zofran because Norco makes him nauseous. Follow-up with primary care ASAP for follow-up  Meds ordered this encounter  Medications  . HYDROcodone-acetaminophen (NORCO) 5-325 MG per tablet    Sig: Take 1 tablet by mouth every 6 (six) hours as needed for moderate pain.    Dispense:  30 tablet    Refill:  0    Order Specific Question:  Supervising Provider    Answer:  Billy Fischer [phone]  . ondansetron (ZOFRAN-ODT) 4 MG disintegrating tablet    Sig: Take 1 tablet (4 mg total) by mouth every 6 (six) hours as needed for nausea. PRN for nausea or vomiting    Dispense:  30 tablet    Refill:  0    Order Specific Question:  Supervising Provider    Answer:  Ihor Gully D [place], PA-C 06/06/14 1409

## 2014-06-06 NOTE — ED Notes (Signed)
Pt  Has  A   History  Of  Back    Problems  He  Reports  Low  Back pain       And  Pain on  Certain movements  And  posistions          denys  A  specefic  Injury       Pt  Is  Sitting   Up in his chair  Speaking in  compltete sentances      He  Did  Not  Blackout     He denys  Any  Abdominal pain     His skin is warm and  Dry  His cap refill is intact

## 2014-06-12 ENCOUNTER — Ambulatory Visit (INDEPENDENT_AMBULATORY_CARE_PROVIDER_SITE_OTHER): Payer: Medicare Other | Admitting: Internal Medicine

## 2014-06-12 ENCOUNTER — Encounter: Payer: Self-pay | Admitting: Internal Medicine

## 2014-06-12 VITALS — BP 130/60 | HR 64 | Temp 97.9°F | Resp 16 | Ht 65.0 in | Wt 160.2 lb

## 2014-06-12 DIAGNOSIS — IMO0002 Reserved for concepts with insufficient information to code with codable children: Secondary | ICD-10-CM

## 2014-06-12 DIAGNOSIS — N189 Chronic kidney disease, unspecified: Secondary | ICD-10-CM

## 2014-06-12 DIAGNOSIS — N183 Chronic kidney disease, stage 3 unspecified: Secondary | ICD-10-CM | POA: Insufficient documentation

## 2014-06-12 DIAGNOSIS — N2889 Other specified disorders of kidney and ureter: Secondary | ICD-10-CM | POA: Insufficient documentation

## 2014-06-12 DIAGNOSIS — Z8546 Personal history of malignant neoplasm of prostate: Secondary | ICD-10-CM

## 2014-06-12 DIAGNOSIS — E785 Hyperlipidemia, unspecified: Secondary | ICD-10-CM

## 2014-06-12 DIAGNOSIS — E1129 Type 2 diabetes mellitus with other diabetic kidney complication: Secondary | ICD-10-CM

## 2014-06-12 DIAGNOSIS — E1165 Type 2 diabetes mellitus with hyperglycemia: Secondary | ICD-10-CM

## 2014-06-12 NOTE — Assessment & Plan Note (Signed)
Obtain Dr Eugenio Hoes labs

## 2014-06-12 NOTE — Progress Notes (Signed)
Subjective:    Patient ID: Leonard [name], male    DOB: [birth year], 81 y.o.   MRN: [id number]  HPI He is here to assess status of active health conditions.  PMH, FH, & Social History reviewed & updated.  He has been compliant with his medicines without adverse effects  He is on a sodium restricted diet.  He describes his blood pressure measurements is "average" at home.  He bikes 6 days weeks for 30 minutes without any cardiopulmonary symptoms.  Dr. Wilson Singer is managing his diabetes  His most significant issue is acute low back pain for which he was seen 06/06/14 in the emergency room. Those records were reviewed.The pain occurred  12-24 hours after repetitive motion cleaning his bathroom floor. This has responded to tramadol.  Significant past history includes fusions at L4-5 and L5-S1.    Review of Systems  Significant headaches, epistaxis, chest pain, palpitations, exertional dyspnea, claudication, paroxysmal nocturnal dyspnea, or edema absent. No GI symptoms , memory loss or myalgias.  The back pain has not been associated with numbness, tingling, weakness in the legs.  He denies loss of control of bladder or bowels.       Objective:   Physical Exam  Gen.: Healthy and well-nourished in appearance. Alert, appropriate and cooperative throughout exam. Appears younger than stated age  Head: Normocephalic without obvious abnormalities; pattern alopecia  Eyes: No corneal or conjunctival inflammation noted. Pupils small but equally round . Ears: External  ear exam reveals no significant lesions or deformities. Canals clear .TMs normal. Hearing is grossly normal bilaterally. Nose: External nasal exam reveals no deformity or inflammation. Nasal mucosa are pink and moist. No lesions or exudates noted.   Mouth: Oral mucosa and oropharynx reveal no lesions or exudates. Teeth in good repair. Upper partial. Neck: No deformities, masses, or tenderness noted. Range of motion & Thyroid  normal. Lungs: Normal respiratory effort; chest expands symmetrically. Lungs are clear to auscultation without rales, wheezes, or increased work of breathing. Heart: Normal rate and rhythm. Normal S1 and S2. No gallop, click, or rub. Grade 1/6 systolic murmur Abdomen: Bowel sounds normal; abdomen soft and nontender. No masses, organomegaly or hernias noted. Genitalia:  As per Dr Karsten Ro                             Musculoskeletal/extremities: No deformity or scoliosis noted of  the thoracic or lumbar spine.  No clubbing, cyanosis, or edema noted. 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 Range of motion normal . Tone & strength normal. Hand joints normal Fingernail  health good. Able to lie down & sit up w/o help.  Negative SLR bilaterally Vascular: Carotid, radial artery, dorsalis pedis and  posterior tibial pulses are equal. Decreased pedal pulses.Left carotid bruit present. Neurologic: Alert and oriented x3. Deep tendon reflexes asymmetric; L knee 1/2 + ; R knee 0+..  Gait normal      Skin: Intact without suspicious lesions or rashes. Lymph: No cervical, axillary lymphadenopathy present. Psych: Mood and affect are normal. Normally interactive. Oriented X 3. Recall good  Assessment & Plan:  See Current Assessment & Plan in Problem List under specific Diagnosis. Verification of current labs @ Dr Eugenio Hoes office needed before labs ordered.

## 2014-06-12 NOTE — Assessment & Plan Note (Addendum)
Lipids,  TSH ,CK

## 2014-06-12 NOTE — Assessment & Plan Note (Addendum)
Last renal function @ Dr Eugenio Hoes office 05/27/14 : creat 1.7,GFR 48.81

## 2014-06-12 NOTE — Patient Instructions (Signed)
Please document the name of the prescribing caregiver beside each medication listed on your medication list.  This is to enhance continuity of care and prevent potential  adverse drug:drug interactions.   Minimal Blood Pressure Goal= AVERAGE < 140/90;  Ideal is an AVERAGE < 135/85. This AVERAGE should be calculated from @ least 5-7 BP readings taken @ different times of day on different days of week. You should not respond to isolated BP readings , but rather the AVERAGE for that week .Please bring your  blood pressure cuff to office visits to verify that it is reliable.It  can also be checked against the blood pressure device at the pharmacy. Finger or wrist cuffs are not dependable; an arm cuff is.

## 2014-06-12 NOTE — Progress Notes (Signed)
Pre visit review using our clinic review tool, if applicable. No additional management support is needed unless otherwise documented below in the visit note. 

## 2014-06-13 NOTE — Addendum Note (Signed)
Addended byHendricks Limes on: 06/13/2014 05:20 PM   Modules accepted: Orders, Medications

## 2014-06-14 ENCOUNTER — Telehealth: Payer: Self-pay

## 2014-06-14 ENCOUNTER — Other Ambulatory Visit (INDEPENDENT_AMBULATORY_CARE_PROVIDER_SITE_OTHER): Payer: Medicare Other

## 2014-06-14 DIAGNOSIS — E785 Hyperlipidemia, unspecified: Secondary | ICD-10-CM

## 2014-06-14 LAB — CK: CK TOTAL: 256 U/L — AB (ref 7–232)

## 2014-06-14 LAB — LIPID PANEL
Cholesterol: 141 mg/dL (ref 0–200)
HDL: 37.8 mg/dL — AB (ref >39.00)
LDL CALC: 85 mg/dL (ref 0–99)
NonHDL: 103.2
TRIGLYCERIDES: 92 mg/dL (ref 0.0–149.0)
Total CHOL/HDL Ratio: 4
VLDL: 18.4 mg/dL (ref 0.0–40.0)

## 2014-06-14 LAB — TSH: TSH: 1.64 u[IU]/mL (ref 0.35–4.50)

## 2014-06-14 NOTE — Telephone Encounter (Signed)
Patient has been advised

## 2014-06-14 NOTE — Telephone Encounter (Signed)
-----   Message from Hendricks Limes, MD sent at 06/13/2014  5:20 PM EST ----- Thank him for the lab results; orders entered for fasting labs needed (lipids, TSH, CK)

## 2014-06-17 ENCOUNTER — Telehealth: Payer: Self-pay

## 2014-06-17 NOTE — Telephone Encounter (Signed)
-----   Message from Hendricks Limes, MD sent at 06/16/2014  3:27 PM EST ----- Please FAX office visit & lab results to Dr Wilson Singer

## 2014-06-17 NOTE — Telephone Encounter (Signed)
Office note and labs have been faxed to Dr Wilson Singer at [phone]

## 2014-07-09 ENCOUNTER — Emergency Department (HOSPITAL_COMMUNITY): Payer: Medicare Other

## 2014-07-09 ENCOUNTER — Emergency Department (HOSPITAL_COMMUNITY)
Admission: EM | Admit: 2014-07-09 | Discharge: 2014-07-10 | Disposition: A | Discharge status: Home / Self Care | Payer: Medicare Other | Attending: Emergency Medicine | Admitting: Emergency Medicine

## 2014-07-09 ENCOUNTER — Encounter (HOSPITAL_COMMUNITY): Payer: Self-pay

## 2014-07-09 DIAGNOSIS — Z794 Long term (current) use of insulin: Secondary | ICD-10-CM | POA: Insufficient documentation

## 2014-07-09 DIAGNOSIS — Z8719 Personal history of other diseases of the digestive system: Secondary | ICD-10-CM | POA: Diagnosis not present

## 2014-07-09 DIAGNOSIS — M1990 Unspecified osteoarthritis, unspecified site: Secondary | ICD-10-CM | POA: Diagnosis not present

## 2014-07-09 DIAGNOSIS — Z951 Presence of aortocoronary bypass graft: Secondary | ICD-10-CM | POA: Insufficient documentation

## 2014-07-09 DIAGNOSIS — R55 Syncope and collapse: Secondary | ICD-10-CM | POA: Diagnosis not present

## 2014-07-09 DIAGNOSIS — E11649 Type 2 diabetes mellitus with hypoglycemia without coma: Secondary | ICD-10-CM | POA: Insufficient documentation

## 2014-07-09 DIAGNOSIS — H9190 Unspecified hearing loss, unspecified ear: Secondary | ICD-10-CM | POA: Insufficient documentation

## 2014-07-09 DIAGNOSIS — I2510 Atherosclerotic heart disease of native coronary artery without angina pectoris: Secondary | ICD-10-CM | POA: Insufficient documentation

## 2014-07-09 DIAGNOSIS — Z87448 Personal history of other diseases of urinary system: Secondary | ICD-10-CM | POA: Diagnosis not present

## 2014-07-09 DIAGNOSIS — I10 Essential (primary) hypertension: Secondary | ICD-10-CM | POA: Diagnosis not present

## 2014-07-09 DIAGNOSIS — Z7982 Long term (current) use of aspirin: Secondary | ICD-10-CM | POA: Insufficient documentation

## 2014-07-09 DIAGNOSIS — E1142 Type 2 diabetes mellitus with diabetic polyneuropathy: Secondary | ICD-10-CM | POA: Insufficient documentation

## 2014-07-09 DIAGNOSIS — H409 Unspecified glaucoma: Secondary | ICD-10-CM | POA: Insufficient documentation

## 2014-07-09 DIAGNOSIS — E162 Hypoglycemia, unspecified: Secondary | ICD-10-CM

## 2014-07-09 DIAGNOSIS — Z87438 Personal history of other diseases of male genital organs: Secondary | ICD-10-CM | POA: Insufficient documentation

## 2014-07-09 DIAGNOSIS — Z79899 Other long term (current) drug therapy: Secondary | ICD-10-CM | POA: Insufficient documentation

## 2014-07-09 DIAGNOSIS — Z8546 Personal history of malignant neoplasm of prostate: Secondary | ICD-10-CM | POA: Diagnosis not present

## 2014-07-09 LAB — CBC WITH DIFFERENTIAL/PLATELET
BASOS ABS: 0 10*3/uL (ref 0.0–0.1)
Basophils Relative: 1 % (ref 0–1)
EOS ABS: 0.3 10*3/uL (ref 0.0–0.7)
Eosinophils Relative: 9 % — ABNORMAL HIGH (ref 0–5)
HCT: 37.8 % — ABNORMAL LOW (ref 39.0–52.0)
HEMOGLOBIN: 12.7 g/dL — AB (ref 13.0–17.0)
Lymphocytes Relative: 37 % (ref 12–46)
Lymphs Abs: 1.4 10*3/uL (ref 0.7–4.0)
MCH: 29 pg (ref 26.0–34.0)
MCHC: 33.6 g/dL (ref 30.0–36.0)
MCV: 86.3 fL (ref 78.0–100.0)
Monocytes Absolute: 0.4 10*3/uL (ref 0.1–1.0)
Monocytes Relative: 12 % (ref 3–12)
NEUTROS ABS: 1.6 10*3/uL — AB (ref 1.7–7.7)
Neutrophils Relative %: 41 % — ABNORMAL LOW (ref 43–77)
Platelets: 124 10*3/uL — ABNORMAL LOW (ref 150–400)
RBC: 4.38 MIL/uL (ref 4.22–5.81)
RDW: 15.1 % (ref 11.5–15.5)
WBC: 3.7 10*3/uL — ABNORMAL LOW (ref 4.0–10.5)

## 2014-07-09 LAB — BASIC METABOLIC PANEL
Anion gap: 5 (ref 5–15)
BUN: 22 mg/dL (ref 6–23)
CO2: 28 mmol/L (ref 19–32)
CREATININE: 1.57 mg/dL — AB (ref 0.50–1.35)
Calcium: 7.8 mg/dL — ABNORMAL LOW (ref 8.4–10.5)
Chloride: 101 mmol/L (ref 96–112)
GFR calc non Af Amer: 37 mL/min — ABNORMAL LOW (ref >90)
GFR, EST AFRICAN AMERICAN: 42 mL/min — AB (ref >90)
Glucose, Bld: 126 mg/dL — ABNORMAL HIGH (ref 70–99)
Potassium: 3.5 mmol/L (ref 3.5–5.1)
SODIUM: 134 mmol/L — AB (ref 135–145)

## 2014-07-09 LAB — CBG MONITORING, ED
GLUCOSE-CAPILLARY: 158 mg/dL — AB (ref 70–99)
Glucose-Capillary: 136 mg/dL — ABNORMAL HIGH (ref 70–99)

## 2014-07-09 NOTE — ED Notes (Signed)
Per EMS - Pt had hypoglycemic episode while at home this evening - family found unresponsive. Son tried to give him sugar water but pt vomited. Minimally responsive upon EMS arrival. Initial CBG of 54. Pt given 25 D50, 500cc NS - CBG 253. Pt insulin dependent and had taken insulin around 7pm (unsure of dose) and ate meal. Pt now a&o x4. First degree HB pn monitor, hr 62bpm,BP 130/76, 100% on RA. Son contact info: Doreene Nest [phone]).

## 2014-07-09 NOTE — ED Notes (Signed)
CBG 136 

## 2014-07-09 NOTE — ED Notes (Addendum)
Leonard [name] (grandson) - pt states okay to contact with updates at [phone]

## 2014-07-09 NOTE — ED Provider Notes (Signed)
CSN: [id number]     Arrival date & time 07/09/14  2238 History   First MD Initiated Contact with Patient 07/09/14 2251     Chief Complaint  Patient presents with  . Hypoglycemia     (Consider location/radiation/quality/duration/timing/severity/associated sxs/prior Treatment) HPI   Leonard [name] is a 81 y.o. male with past mental history significant for hypertension, mild bilateral carotid stenosis, CAD, renal insufficiency insulin-dependent diabetes brought in by EMS for hypoglycemic episode. Insulin-dependent, no sulfonylureas Patient was found down on the floor by his grandson, patient lives at home with his wife. He took his typical nightly dosage of insulin which he administered himself. States he was in his normal state of health, he sat down to watch a game on TV and the next thing he remembers his wife and grandson were trying to wake him up. Grandson gave sugar water but patient vomited. Patient was minimally responsive when EMS arrived, CBG was 54. Patient was given an amp of D50 and a half liter bolus. Patient didn't his normal state of health, eating and drinking normally, no chest pain, nausea vomiting or shortness of breath, headache, dysarthria. He is not anticoagulated. No recent change to his insulin regimen. States he takes 8 units in the a.m., lunch is a sliding scale, 10 units in the p.m.  PCP [facility]  Past Medical History  Diagnosis Date  . Hypertension   . Prostate cancer 1996    s/p external radiation  . Carotid stenosis, bilateral per doppler 08-20-09    mild  . DDD (degenerative disc disease), lumbar     s/p fusion 4-5  . Renal insufficiency hx  . Coronary artery disease ?date last stress test    hx cabg and redo-- last visit cardiologist dr Olevia Perches 6 yrs ago, sees pcp dr Gwyndolyn Saxon hopper  . Diabetic peripheral neuropathy     feet  . Diabetes mellitus     oral med and insuin  . Bladder tumor     hematuria  . Nocturia   . Glaucoma bilateral     eye drop  rx  . HOH (hard of hearing) no hearing aids  . History of hiatal hernia     denies reflux  . Syncope 06/06/2011    In context of dehydration and bradycardia.   Past Surgical History  Procedure Laterality Date  . Upper gastrointestinal endoscopy      dx hiatal hernia  . Lumbar fusion  01/2004    L 4-5  . Coronary artery bypass graft  1984    4 vessels  . Coronary artery bypass graft  1997- redo    3 vessels  . Lipoma excision  1970's    from back  . Cataract extraction w/ intraocular lens  implant, bilateral    . Cardiac catheterization  last cath 2005    per dr Olevia Perches- w/ chart (grafts patent)-  results w/ chart  . Transurethral resection of bladder tumor  04/16/2011    Procedure: TRANSURETHRAL RESECTION OF BLADDER TUMOR (TURBT);  Surgeon: Claybon Jabs;  Location: [facility];  Service: Urology;  Laterality: N/A;   Family History  Problem Relation Age of Onset  . Diabetes Sister   . Cancer Neg Hx   . Heart disease Neg Hx   . Stroke Neg Hx    History  Substance Use Topics  . Smoking status: Never Smoker   . Smokeless tobacco: Never Used  . Alcohol Use: No    Review of Systems  10 systems  reviewed and found to be negative, except as noted in the HPI.   Allergies  Codeine and Ace inhibitors  Home Medications   Prior to Admission medications   Medication Sig Start Date End Date Taking? Authorizing Provider  amLODipine (NORVASC) 10 MG tablet TAKE 1 TABLET BY MOUTH EVERY DAY 03/06/14  Yes Hendricks Limes, MD  aspirin 81 MG tablet Take 81 mg by mouth daily.    Yes Historical Provider, MD  atorvastatin (LIPITOR) 40 MG tablet Take 40 mg by mouth daily.   Yes Historical Provider, MD  bimatoprost (LUMIGAN) 0.03 % ophthalmic drops Place 1 drop into both eyes at bedtime.    Yes Historical Provider, MD  fish oil-omega-3 fatty acids 1000 MG capsule Take 1 g by mouth 2 (two) times daily.    Yes Historical Provider, MD  gabapentin (NEURONTIN) 600 MG tablet Take  600 mg by mouth 3 (three) times daily.     Yes Historical Provider, MD  insulin glargine (LANTUS) 100 UNIT/ML injection Inject 10 Units into the skin 2 (two) times daily.    Yes Historical Provider, MD  insulin lispro (HUMALOG) 100 UNIT/ML injection Inject 6-10 Units into the skin 2 (two) times daily before lunch and supper. Per sliding scale.   Yes Historical Provider, MD  losartan-hydrochlorothiazide (HYZAAR) 100-25 MG per tablet TAKE 1 TABLET DAILY 03/18/14  Yes Hendricks Limes, MD  mirabegron ER (MYRBETRIQ) 25 MG TB24 tablet Take 25 mg by mouth daily.    Yes Historical Provider, MD  nitroGLYCERIN (NITROSTAT) 0.4 MG SL tablet Place 0.4 mg under the tongue every 5 (five) minutes as needed. For chest pain. Patient out of Rx  05/11/11  Yes Hendricks Limes, MD  potassium chloride SA (K-DUR,KLOR-CON) 20 MEQ tablet Take 20 mEq by mouth 2 (two) times daily.   Yes Historical Provider, MD  sitaGLIPtan (JANUVIA) 100 MG tablet Take 50 mg by mouth daily.    Yes Historical Provider, MD  Tamsulosin HCl (FLOMAX) 0.4 MG CAPS Take 0.4 mg by mouth daily.   Yes Historical Provider, MD  Fesoterodine Fumarate (TOVIAZ PO) Take by mouth.    Historical Provider, MD  HYDROcodone-acetaminophen (NORCO) 5-325 MG per tablet Take 1 tablet by mouth every 6 (six) hours as needed for moderate pain. Patient not taking: Reported on 07/10/2014 06/06/14   Liam Graham, PA-C  meclizine (ANTIVERT) 25 MG tablet 1/2 tablet q 6-8h prn dizziness. May cause drowsiness. Patient not taking: Reported on 07/10/2014 10/22/13   Janne Napoleon, NP  ondansetron (ZOFRAN-ODT) 4 MG disintegrating tablet Take 1 tablet (4 mg total) by mouth every 6 (six) hours as needed for nausea. PRN for nausea or vomiting Patient not taking: Reported on 07/10/2014 06/06/14   Liam Graham, PA-C  sulfamethoxazole-trimethoprim (BACTRIM DS,SEPTRA DS) 800-160 MG per tablet Take 1 tablet by mouth 2 (two) times daily.    Historical Provider, MD  traMADol (ULTRAM) 50 MG tablet  Take 1 tablet (50 mg total) by mouth every 6 (six) hours as needed for pain. Patient not taking: Reported on 07/10/2014 01/09/13   Liam Graham, PA-C   BP 150/78 mmHg  Pulse 58  Temp(Src) 97.6 F (36.4 C) (Oral)  Resp 16  SpO2 98% Physical Exam  Constitutional: He is oriented to person, place, and time. He appears well-developed and well-nourished. No distress.  HENT:  Head: Normocephalic and atraumatic.  Mouth/Throat: Oropharynx is clear and moist.  No signs of trauma  Eyes: Conjunctivae and EOM are normal. Pupils are equal, round,  and reactive to light.  Neck: Normal range of motion. Neck supple.  No midline C-spine  tenderness to palpation or step-offs appreciated. Patient has full range of motion without pain.   Cardiovascular: Normal rate, regular rhythm and intact distal pulses.   Pulmonary/Chest: Effort normal and breath sounds normal. No stridor. No respiratory distress. He has no wheezes. He has no rales. He exhibits no tenderness.  Abdominal: Soft. He exhibits no distension and no mass. There is no tenderness. There is no rebound and no guarding.  Musculoskeletal: Normal range of motion.  Neurological: He is alert and oriented to person, place, and time.  Psychiatric: He has a normal mood and affect.  Nursing note and vitals reviewed.   ED Course  Procedures (including critical care time) Labs Review Labs Reviewed  CBC WITH DIFFERENTIAL/PLATELET - Abnormal; Notable for the following:    WBC 3.7 (*)    Hemoglobin 12.7 (*)    HCT 37.8 (*)    Platelets 124 (*)    Neutrophils Relative % 41 (*)    Neutro Abs 1.6 (*)    Eosinophils Relative 9 (*)    All other components within normal limits  BASIC METABOLIC PANEL - Abnormal; Notable for the following:    Sodium 134 (*)    Glucose, Bld 126 (*)    Creatinine, Ser 1.57 (*)    Calcium 7.8 (*)    GFR calc non Af Amer 37 (*)    GFR calc Af Amer 42 (*)    All other components within normal limits  CBG MONITORING, ED -  Abnormal; Notable for the following:    Glucose-Capillary 158 (*)    All other components within normal limits  CBG MONITORING, ED - Abnormal; Notable for the following:    Glucose-Capillary 136 (*)    All other components within normal limits  CBG MONITORING, ED - Abnormal; Notable for the following:    Glucose-Capillary 173 (*)    All other components within normal limits  URINALYSIS, ROUTINE W REFLEX MICROSCOPIC  CBG MONITORING, ED    Imaging Review Dg Chest 2 View  07/09/2014   CLINICAL DATA:  Hypoglycemia.  EXAM: CHEST  2 VIEW  COMPARISON:  10/22/2013  FINDINGS: Patient is post median sternotomy. There is stable cardiomegaly and tortuous thoracic aorta. Minimal bibasilar atelectasis, no consolidation to suggest pneumonia. There is no pleural effusion or pneumothorax. Pulmonary vasculature is normal. No acute osseous abnormalities.  IMPRESSION: No acute pulmonary process.   Electronically Signed   By: Jeb Levering M.D.   On: 07/09/2014 23:38   Ct Head Wo Contrast  07/10/2014   CLINICAL DATA:  Initial evaluation for acute fall. Hypoglycemia. No current pain.  EXAM: CT HEAD WITHOUT CONTRAST  CT CERVICAL SPINE WITHOUT CONTRAST  TECHNIQUE: Multidetector CT imaging of the head and cervical spine was performed following the standard protocol without intravenous contrast. Multiplanar CT image reconstructions of the cervical spine were also generated.  COMPARISON:  Prior study from 06/06/2011  FINDINGS: CT HEAD FINDINGS  Diffuse prominence of the CSF containing spaces is compatible with generalized cerebral atrophy. Patchy and confluent hypodensity within the periventricular and deep white matter both cerebral hemispheres most consistent with chronic small vessel ischemic disease. Prominent vascular calcifications present within the carotid siphons and distal vertebral arteries.  No intracranial hemorrhage. No large vessel territory infarct. No mass lesion midline shift. No hydrocephalus. No  extra-axial fluid collection.  Scalp soft tissues within normal limits. No acute abnormality seen about the orbits.  Calvarium  intact. Partially visualized left maxillary sinus is largely opacified. Mild opacity present within the left sphenoid sinus as well. No mastoid effusion.  CT CERVICAL SPINE FINDINGS  Vertebral bodies are normally aligned with preservation of the normal cervical lordosis. Vertebral body heights are maintained. Normal C1-2 articulations are intact. No acute fracture listhesis. Prevertebral soft tissues are normal.  Prominent anterior endplate osteophytic spurring present at C5-6 and C6-7. Degenerative changes present about the C1-2 articulation.  No soft tissue abnormality within the neck. Prominent vascular calcifications noted about the carotid bifurcations bilaterally. Partial visualized lung apices are clear.  IMPRESSION: CT HEAD:  1. No acute intracranial process. 2. Generalized cerebral atrophy with chronic microvascular ischemic disease.  CT CERVICAL SPINE:  1. No acute traumatic injury within the cervical spine. 2. Stable multilevel degenerative disc disease.   Electronically Signed   By: Jeannine Boga M.D.   On: 07/10/2014 00:20   Ct Cervical Spine Wo Contrast  07/10/2014   CLINICAL DATA:  Initial evaluation for acute fall. Hypoglycemia. No current pain.  EXAM: CT HEAD WITHOUT CONTRAST  CT CERVICAL SPINE WITHOUT CONTRAST  TECHNIQUE: Multidetector CT imaging of the head and cervical spine was performed following the standard protocol without intravenous contrast. Multiplanar CT image reconstructions of the cervical spine were also generated.  COMPARISON:  Prior study from 06/06/2011  FINDINGS: CT HEAD FINDINGS  Diffuse prominence of the CSF containing spaces is compatible with generalized cerebral atrophy. Patchy and confluent hypodensity within the periventricular and deep white matter both cerebral hemispheres most consistent with chronic small vessel ischemic disease.  Prominent vascular calcifications present within the carotid siphons and distal vertebral arteries.  No intracranial hemorrhage. No large vessel territory infarct. No mass lesion midline shift. No hydrocephalus. No extra-axial fluid collection.  Scalp soft tissues within normal limits. No acute abnormality seen about the orbits.  Calvarium intact. Partially visualized left maxillary sinus is largely opacified. Mild opacity present within the left sphenoid sinus as well. No mastoid effusion.  CT CERVICAL SPINE FINDINGS  Vertebral bodies are normally aligned with preservation of the normal cervical lordosis. Vertebral body heights are maintained. Normal C1-2 articulations are intact. No acute fracture listhesis. Prevertebral soft tissues are normal.  Prominent anterior endplate osteophytic spurring present at C5-6 and C6-7. Degenerative changes present about the C1-2 articulation.  No soft tissue abnormality within the neck. Prominent vascular calcifications noted about the carotid bifurcations bilaterally. Partial visualized lung apices are clear.  IMPRESSION: CT HEAD:  1. No acute intracranial process. 2. Generalized cerebral atrophy with chronic microvascular ischemic disease.  CT CERVICAL SPINE:  1. No acute traumatic injury within the cervical spine. 2. Stable multilevel degenerative disc disease.   Electronically Signed   By: Jeannine Boga M.D.   On: 07/10/2014 00:20     EKG Interpretation   Date/Time:  Tuesday July 09 2014 22:49:20 EST Ventricular Rate:  53 PR Interval:  353 QRS Duration: 140 QT Interval:  479 QTC Calculation: 450 R Axis:   -32 Text Interpretation:  Sinus rhythm Prolonged PR interval Right bundle  branch block Probable left ventricular hypertrophy Baseline wander in  lead(s) V2 No significant change since last tracing Confirmed by OTTER   MD, OLGA ([id number]) on 07/09/2014 11:24:57 PM      MDM   Final diagnoses:  Hypoglycemia    Filed Vitals:   07/09/14 2251  07/09/14 2345 07/10/14 0000 07/10/14 0030  BP: 129/57 141/65 141/70 150/78  Pulse: 55 54 59 58  Temp: 97.6 F (36.4 C)  TempSrc: Oral     Resp: 17 16 15 16   SpO2: 98% 98% 97% 98%    Leonard [name] is a pleasant 81 y.o. male presenting with hypoglycemia that resulted in loss of consciousness. Patient administered his typical dose of insulin, eating and drinking normally. Patient is alert and oriented 3, neuro exam is nonfocal, no signs of head trauma. Patient does not take any sulfonylureas. Blood sugars remained stable in the ED. Renal function is at his baseline. Chest x-ray, head and cervical spine CT are negative. UA with out signs of infection. Cx sent.   This is a shared visit with the attending physician who personally evaluated the patient and agrees with the care plan.   Evaluation does not show pathology that would require ongoing emergent intervention or inpatient treatment. Pt is hemodynamically stable and mentating appropriately. Discussed findings and plan with patient/guardian, who agrees with care plan. All questions answered. Return precautions discussed and outpatient follow up given.        Monico Blitz, PA-C 07/10/14 ID:[id number]  Kalman Drape, MD 07/10/14 [phone]

## 2014-07-10 DIAGNOSIS — E11649 Type 2 diabetes mellitus with hypoglycemia without coma: Secondary | ICD-10-CM | POA: Diagnosis not present

## 2014-07-10 LAB — URINALYSIS, ROUTINE W REFLEX MICROSCOPIC
Bilirubin Urine: NEGATIVE
Glucose, UA: NEGATIVE mg/dL
Hgb urine dipstick: NEGATIVE
Ketones, ur: NEGATIVE mg/dL
Nitrite: NEGATIVE
Protein, ur: NEGATIVE mg/dL
Specific Gravity, Urine: 1.011 (ref 1.005–1.030)
Urobilinogen, UA: 0.2 mg/dL (ref 0.0–1.0)
pH: 5.5 (ref 5.0–8.0)

## 2014-07-10 LAB — CBG MONITORING, ED: Glucose-Capillary: 173 mg/dL — ABNORMAL HIGH (ref 70–99)

## 2014-07-10 LAB — URINE MICROSCOPIC-ADD ON

## 2014-07-10 NOTE — ED Notes (Signed)
Pt given cup of orange juice and graham crackers.

## 2014-07-10 NOTE — Discharge Instructions (Signed)
Please follow with your primary care doctor in the next 2 days for a check-up. They must obtain records for further management.   Do not hesitate to return to the Emergency Department for any new, worsening or concerning symptoms.    Low Blood Sugar Low blood sugar (hypoglycemia) means that the level of sugar in your blood is lower than it should be. Signs of low blood sugar include:  Getting sweaty.  Feeling hungry.  Feeling dizzy or weak.  Feeling sleepier than normal.  Feeling nervous.  Headaches.  Having a fast heartbeat. Low blood sugar can happen fast and can be an emergency. Your doctor can do tests to check your blood sugar level. You can have low blood sugar and not have diabetes. HOME CARE  Check your blood sugar as told by your doctor. If it is less than 70 mg/dl or as told by your doctor, take 1 of the following:  3 to 4 glucose tablets.   cup clear juice.   cup soda pop, not diet.  1 cup milk.  5 to 6 hard candies.  Recheck blood sugar after 15 minutes. Repeat until it is at the right level.  Eat a snack if it is more than 1 hour until the next meal.  Only take medicine as told by your doctor.  Do not skip meals. Eat on time.  Do not drink alcohol except with meals.  Check your blood glucose before driving.  Check your blood glucose before and after exercise.  Always carry treatment with you, such as glucose pills.  Always wear a medical alert bracelet if you have diabetes. GET HELP RIGHT AWAY IF:   Your blood glucose goes below 70 mg/dl or as told by your doctor, and you:  Are confused.  Are not able to swallow.  Pass out (faint).  You cannot treat yourself. You may need someone to help you.  You have low blood sugar problems often.  You have problems from your medicines.  You are not feeling better after 3 to 4 days.  You have vision changes. MAKE SURE YOU:   Understand these instructions.  Will watch this condition.  Will  get help right away if you are not doing well or get worse. Document Released: 08/18/2009 Document Revised: 08/16/2011 Document Reviewed: 08/18/2009 [facility] Patient Information 2015 [place], [place]. This information is not intended to replace advice given to you by your health care provider. Make sure you discuss any questions you have with your health care provider.

## 2014-07-12 LAB — URINE CULTURE

## 2014-07-15 ENCOUNTER — Telehealth (HOSPITAL_COMMUNITY): Payer: Self-pay

## 2014-07-15 NOTE — Telephone Encounter (Signed)
Post ED Visit - Positive Culture Follow-up  Culture report reviewed by antimicrobial stewardship pharmacist: []  Wes Dulaney, Pharm.D., BCPS []  Heide Guile, Pharm.D., BCPS []  Alycia Rossetti, Pharm.D., BCPS [x]  [place], Pharm.D., BCPS, AAHIVP []  Legrand Como, Pharm.D., BCPS, AAHIVP []  Isac Sarna, Pharm.D., BCPS  Positive Urine culture, >/= 100,000 colonies -> E Coli No symptoms, UA (-). No further patient follow-up is required at this time.  Dortha Kern 07/15/2014, 5:19 AM

## 2014-08-19 ENCOUNTER — Telehealth: Payer: Self-pay

## 2014-08-19 NOTE — Telephone Encounter (Signed)
LVM for patient to call and confirm flu vaccine or can still receive vaccine at the office before 09/05/2014.

## 2014-08-20 ENCOUNTER — Telehealth: Payer: Self-pay

## 2014-08-20 NOTE — Telephone Encounter (Signed)
The patient called to confirm receipt of flu shot but was not sure of the month, but rec'd at [facility].

## 2014-08-20 NOTE — Telephone Encounter (Signed)
Patient confirmed receipt of flu vaccine.

## 2014-08-26 ENCOUNTER — Other Ambulatory Visit: Payer: Self-pay | Admitting: Internal Medicine

## 2014-09-11 ENCOUNTER — Other Ambulatory Visit: Payer: Self-pay | Admitting: Internal Medicine

## 2014-11-28 ENCOUNTER — Encounter: Payer: Self-pay | Admitting: Internal Medicine

## 2014-11-28 ENCOUNTER — Ambulatory Visit (INDEPENDENT_AMBULATORY_CARE_PROVIDER_SITE_OTHER): Payer: Medicare Other | Admitting: Internal Medicine

## 2014-11-28 ENCOUNTER — Other Ambulatory Visit (INDEPENDENT_AMBULATORY_CARE_PROVIDER_SITE_OTHER): Payer: Medicare Other

## 2014-11-28 VITALS — BP 118/58 | HR 66 | Temp 97.8°F | Resp 18 | Wt 154.0 lb

## 2014-11-28 DIAGNOSIS — N189 Chronic kidney disease, unspecified: Secondary | ICD-10-CM

## 2014-11-28 DIAGNOSIS — N2889 Other specified disorders of kidney and ureter: Secondary | ICD-10-CM

## 2014-11-28 DIAGNOSIS — I10 Essential (primary) hypertension: Secondary | ICD-10-CM

## 2014-11-28 DIAGNOSIS — E785 Hyperlipidemia, unspecified: Secondary | ICD-10-CM

## 2014-11-28 DIAGNOSIS — N183 Chronic kidney disease, stage 3 unspecified: Secondary | ICD-10-CM

## 2014-11-28 LAB — BASIC METABOLIC PANEL
BUN: 24 mg/dL — ABNORMAL HIGH (ref 6–23)
CALCIUM: 8.5 mg/dL (ref 8.4–10.5)
CHLORIDE: 102 meq/L (ref 96–112)
CO2: 27 meq/L (ref 19–32)
CREATININE: 1.63 mg/dL — AB (ref 0.40–1.50)
GFR: 42.22 mL/min — ABNORMAL LOW (ref >60.00)
Glucose, Bld: 140 mg/dL — ABNORMAL HIGH (ref 70–99)
Potassium: 3.8 mEq/L (ref 3.5–5.1)
Sodium: 134 mEq/L — ABNORMAL LOW (ref 135–145)

## 2014-11-28 LAB — CK: Total CK: 201 U/L (ref 7–232)

## 2014-11-28 NOTE — Progress Notes (Signed)
Pre visit review using our clinic review tool, if applicable. No additional management support is needed unless otherwise documented below in the visit note. 

## 2014-11-28 NOTE — Progress Notes (Signed)
   Subjective:    Patient ID: Leonard [name], male    DOB: Dec 27, 1920, 81 y.o.   MRN: [id number]  HPI The patient is here to assess status of active health conditions.   He does not add salt to his food. He does eat fried foods. He bikes 35 minutes daily without cardiovascular symptoms. He states his blood pressure averages <100/65.  Other than nocturia 3 times a night he has no symptoms. He has history of prostate cancer and is followed by a Urologist.  His glucose is are markedly variable; he sees Dr Wilson Singer for his  diabetic management.  His lipids were checked in January this year. LDL was 85; CK was elevated at 254. TSH at that time was therapeutic at 1.64. He had renal insufficiency based on the BMET 07/09/14. Creatinine was 1.57 and GFR 42.K+ was 3.5   Review of Systems  Chest pain, palpitations, tachycardia, exertional dyspnea, paroxysmal nocturnal dyspnea, claudication or edema are absent. No unexplained weight loss, abdominal pain, significant dyspepsia, dysphagia, melena, rectal bleeding, or persistently small caliber stools. Dysuria, pyuria, hematuria, frequency, or polyuria are denied. Change in hair, skin, nails denied. No bowel changes of constipation or diarrhea. No intolerance to heat or cold.     Objective:   Physical Exam Pertinent or positive findings include:  Appears younger than stated age.Pattern alopecia present. Arcus senilis is present. Upper partial noted. There is a grade 1 systolic murmur with left carotid radiation. 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 Minor crepitus of the knees is present. dorsalis pedis pulses are decreased.   General appearance :Thin but adequately nourished; in no distress.  Eyes: No conjunctival inflammation or scleral icterus is present.  Oral exam:  Lips and gums are healthy appearing.There is no oropharyngeal erythema or exudate noted.   Heart:  Normal rate and regular rhythm. S1 and S2 normal  without gallop, click, rub or other extra sounds    Lungs:Chest clear to auscultation; no wheezes, rhonchi,rales ,or rubs present.No increased work of breathing.   Abdomen: bowel sounds normal, soft and non-tender without masses, organomegaly or hernias noted.  No guarding or rebound. No flank tenderness to percussion.  Vascular : all pulses equal ; no bruits present.  Skin:Warm & dry.  Intact without suspicious lesions or rashes ; no tenting or jaundice   Lymphatic: No lymphadenopathy is noted about the head, neck, axilla.   Neuro: Strength, tone & DTRs normal.        Assessment & Plan:  #1 hypertension; the ARB/HCT will be decreased to one half daily with ongoing monitor  #2 dyslipidemia; CK will be rechecked.  #3 renal insufficiency; BMET will be checked

## 2014-11-28 NOTE — Patient Instructions (Signed)

## 2014-12-09 ENCOUNTER — Emergency Department (INDEPENDENT_AMBULATORY_CARE_PROVIDER_SITE_OTHER)
Admission: EM | Admit: 2014-12-09 | Discharge: 2014-12-09 | Disposition: A | Discharge status: Home / Self Care | Payer: Medicare Other | Source: Home / Self Care

## 2014-12-09 ENCOUNTER — Encounter (HOSPITAL_COMMUNITY): Payer: Self-pay | Admitting: Emergency Medicine

## 2014-12-09 DIAGNOSIS — I10 Essential (primary) hypertension: Secondary | ICD-10-CM | POA: Diagnosis not present

## 2014-12-09 DIAGNOSIS — M109 Gout, unspecified: Secondary | ICD-10-CM

## 2014-12-09 DIAGNOSIS — M10071 Idiopathic gout, right ankle and foot: Secondary | ICD-10-CM | POA: Diagnosis not present

## 2014-12-09 MED ORDER — KETOROLAC TROMETHAMINE 30 MG/ML IJ SOLN
30.0000 mg | Freq: Once | INTRAMUSCULAR | Status: AC
Start: 2014-12-09 — End: 2014-12-09
  Administered 2014-12-09: 30 mg via INTRAMUSCULAR

## 2014-12-09 MED ORDER — COLCHICINE 0.6 MG PO TABS: ORAL_TABLET | ORAL | Status: DC

## 2014-12-09 MED ORDER — KETOROLAC TROMETHAMINE 30 MG/ML IJ SOLN
INTRAMUSCULAR | Status: AC
  Filled 2014-12-09: qty 1

## 2014-12-09 NOTE — Discharge Instructions (Signed)
Your BP is also elevated, this could be due to pain or change in your medication. Please see your PCP soon if your BP remains higher tham 150/80 or if having any sysmptoms.  Gout Gout is when your joints become red, sore, and swell (inflamed). This is caused by the buildup of uric acid crystals in the joints. Uric acid is a chemical that is normally in the blood. If the level of uric acid gets too high in the blood, these crystals form in your joints and tissues. Over time, these crystals can form into masses near the joints and tissues. These masses can destroy bone and cause the bone to look misshapen (deformed). HOME CARE   Do not take aspirin for pain.  Only take medicine as told by your doctor.  Rest the joint as much as you can. When in bed, keep sheets and blankets off painful areas.  Keep the sore joints raised (elevated).  Put warm or cold packs on painful joints. Use of warm or cold packs depends on which works best for you.  Use crutches if the painful joint is in your leg.  Drink enough fluids to keep your pee (urine) clear or pale yellow. Limit alcohol, sugary drinks, and drinks with fructose in them.  Follow your diet instructions. Pay careful attention to how much protein you eat. Include fruits, vegetables, whole grains, and fat-free or low-fat milk products in your daily diet. Talk to your doctor or dietitian about the use of coffee, vitamin C, and cherries. These may help lower uric acid levels.  Keep a healthy body weight. GET HELP RIGHT AWAY IF:   You have watery poop (diarrhea), throw up (vomit), or have any side effects from medicines.  You do not feel better in 24 hours, or you are getting worse.  Your joint becomes suddenly more tender, and you have chills or a fever. MAKE SURE YOU:   Understand these instructions.  Will watch your condition.  Will get help right away if you are not doing well or get worse. Document Released: 03/02/2008 Document Revised:  10/08/2013 Document Reviewed: 01/05/2012 [facility] Patient Information 2015 [place], [place]. This information is not intended to replace advice given to you by your health care provider. Make sure you discuss any questions you have with your health care provider.

## 2014-12-09 NOTE — ED Provider Notes (Signed)
CSN: [id number]     Arrival date & time 12/09/14  1642 History   None    Chief Complaint  Patient presents with  . Foot Pain   (Consider location/radiation/quality/duration/timing/severity/associated sxs/prior Treatment) Patient is a 81 y.o. male presenting with lower extremity pain. The history is provided by the patient.  Foot Pain This is a new problem. The current episode started 12 to 24 hours ago (right foot pain started this morning.). The problem occurs hourly. The problem has not changed since onset.Associated symptoms comments: Denies any foot injury. Pain is more on his right first big toe. Associated with swelling and redness over his right big toe.. The symptoms are aggravated by walking. Nothing relieves the symptoms. He has tried nothing for the symptoms.  HTN: Took his BP med last night. He was recently seen by PCP who made adjustment to his medication. He denies any headache, no vision change, no N/V. He stated his home BP check today was normal.  Past Medical History  Diagnosis Date  . Hypertension   . Prostate cancer 1996    s/p external radiation  . Carotid stenosis, bilateral per doppler 08-20-09    mild  . DDD (degenerative disc disease), lumbar     s/p fusion 4-5  . Renal insufficiency hx  . Coronary artery disease ?date last stress test    hx cabg and redo-- last visit cardiologist dr Olevia Perches 6 yrs ago, sees pcp dr Gwyndolyn Saxon hopper  . Diabetic peripheral neuropathy     feet  . Diabetes mellitus     oral med and insuin  . Bladder tumor     hematuria  . Nocturia   . Glaucoma bilateral     eye drop rx  . HOH (hard of hearing) no hearing aids  . History of hiatal hernia     denies reflux  . Syncope 06/06/2011    In context of dehydration and bradycardia.   Past Surgical History  Procedure Laterality Date  . Upper gastrointestinal endoscopy      dx hiatal hernia  . Lumbar fusion  01/2004    L 4-5  . Coronary artery bypass graft  1984    4 vessels  .  Coronary artery bypass graft  1997- redo    3 vessels  . Lipoma excision  1970's    from back  . Cataract extraction w/ intraocular lens  implant, bilateral    . Cardiac catheterization  last cath 2005    per dr Olevia Perches- w/ chart (grafts patent)-  results w/ chart  . Transurethral resection of bladder tumor  04/16/2011    Procedure: TRANSURETHRAL RESECTION OF BLADDER TUMOR (TURBT);  Surgeon: Claybon Jabs;  Location: [facility];  Service: Urology;  Laterality: N/A;   Family History  Problem Relation Age of Onset  . Diabetes Sister   . Cancer Neg Hx   . Heart disease Neg Hx   . Stroke Neg Hx    History  Substance Use Topics  . Smoking status: Never Smoker   . Smokeless tobacco: Never Used  . Alcohol Use: No    Review of Systems  Respiratory: Negative.   Cardiovascular: Negative.   Gastrointestinal: Negative.   Musculoskeletal: Positive for joint swelling and arthralgias.  Neurological: Negative.   All other systems reviewed and are negative.   Allergies  Codeine and Ace inhibitors  Home Medications   Prior to Admission medications   Medication Sig Start Date End Date Taking? Authorizing Provider  amLODipine (NORVASC)  10 MG tablet TAKE 1 TABLET BY MOUTH EVERY DAY 08/26/14   Hendricks Limes, MD  aspirin 81 MG tablet Take 81 mg by mouth daily.     Historical Provider, MD  atorvastatin (LIPITOR) 40 MG tablet Take 40 mg by mouth daily.    Historical Provider, MD  bimatoprost (LUMIGAN) 0.03 % ophthalmic drops Place 1 drop into both eyes at bedtime.     Historical Provider, MD  Fesoterodine Fumarate (TOVIAZ PO) Take by mouth.    Historical Provider, MD  fish oil-omega-3 fatty acids 1000 MG capsule Take 1 g by mouth 2 (two) times daily.     Historical Provider, MD  gabapentin (NEURONTIN) 600 MG tablet Take 600 mg by mouth 3 (three) times daily.      Historical Provider, MD  HYDROcodone-acetaminophen (NORCO) 5-325 MG per tablet Take 1 tablet by mouth every 6 (six)  hours as needed for moderate pain. 06/06/14   Freeman Caldron Baker, PA-C  insulin glargine (LANTUS) 100 UNIT/ML injection Inject 10 Units into the skin 2 (two) times daily.     Historical Provider, MD  insulin lispro (HUMALOG) 100 UNIT/ML injection Inject 6-10 Units into the skin 2 (two) times daily before lunch and supper. Per sliding scale.    Historical Provider, MD  losartan-hydrochlorothiazide [facility]) 100-25 MG per tablet 1/2 daily 11/28/14   Hendricks Limes, MD  mirabegron ER (MYRBETRIQ) 25 MG TB24 tablet Take 25 mg by mouth daily.     Historical Provider, MD  nitroGLYCERIN (NITROSTAT) 0.4 MG SL tablet Place 0.4 mg under the tongue every 5 (five) minutes as needed. For chest pain. Patient out of Rx  05/11/11   Hendricks Limes, MD  ondansetron (ZOFRAN-ODT) 4 MG disintegrating tablet Take 1 tablet (4 mg total) by mouth every 6 (six) hours as needed for nausea. PRN for nausea or vomiting 06/06/14   Liam Graham, PA-C  potassium chloride SA (K-DUR,KLOR-CON) 20 MEQ tablet Take 20 mEq by mouth 2 (two) times daily.    Historical Provider, MD  sitaGLIPtan (JANUVIA) 100 MG tablet Take 50 mg by mouth daily.     Historical Provider, MD  Tamsulosin HCl (FLOMAX) 0.4 MG CAPS Take 0.4 mg by mouth daily.    Historical Provider, MD   BP 176/77 mmHg  Pulse 66  Temp(Src) 97 F (36.1 C) (Oral)  Resp 18  SpO2 96% Physical Exam  Constitutional: He appears well-developed. No distress.  Cardiovascular: Normal rate, regular rhythm and normal heart sounds.   No murmur heard. Pulmonary/Chest: Effort normal and breath sounds normal. No respiratory distress. He has no wheezes.  Musculoskeletal:       Right foot: There is tenderness and swelling. There is normal range of motion.       Feet:  Nursing note and vitals reviewed.   ED Course  Procedures (including critical care time) Labs Review Labs Reviewed - No data to display  Imaging Review No results found.   MDM  No diagnosis found.  Podagra  swelling: Gout HTN 180/86  Gout: Toradol IM given x 1 today. Colchicine prescribed. F/U with PCP in 1-2 days.  BP elevated, this might be related to his pain. I reviewed his cardiologist's note, his BP during his visit then was [id number] systolic. Patient advised to see PCP tomorrow for BP recheck. Continue home BP monitoring and if consistently high and if symptomatic he is advised to go to the ED.    Kinnie Feil, MD 12/09/14 [phone]

## 2014-12-09 NOTE — ED Notes (Signed)
C/o right foot pain States pain is located metatarsals of foot Denies any injury to foot

## 2014-12-12 ENCOUNTER — Ambulatory Visit (INDEPENDENT_AMBULATORY_CARE_PROVIDER_SITE_OTHER): Payer: Medicare Other | Admitting: Internal Medicine

## 2014-12-12 ENCOUNTER — Ambulatory Visit: Payer: Federal, State, Local not specified - PPO | Admitting: Internal Medicine

## 2014-12-12 ENCOUNTER — Encounter: Payer: Self-pay | Admitting: Internal Medicine

## 2014-12-12 ENCOUNTER — Other Ambulatory Visit (INDEPENDENT_AMBULATORY_CARE_PROVIDER_SITE_OTHER): Payer: Medicare Other

## 2014-12-12 VITALS — BP 142/70 | HR 64 | Temp 97.8°F | Resp 14 | Wt 151.0 lb

## 2014-12-12 DIAGNOSIS — M10071 Idiopathic gout, right ankle and foot: Secondary | ICD-10-CM

## 2014-12-12 DIAGNOSIS — M109 Gout, unspecified: Secondary | ICD-10-CM

## 2014-12-12 LAB — URIC ACID: URIC ACID, SERUM: 5.6 mg/dL (ref 4.0–7.8)

## 2014-12-12 MED ORDER — LOSARTAN POTASSIUM 100 MG PO TABS: 100.0000 mg | ORAL_TABLET | Freq: Every day | ORAL | Status: DC

## 2014-12-12 MED ORDER — PREDNISONE 10 MG PO TABS
ORAL_TABLET | ORAL | Status: DC
Start: 2014-12-12 — End: 2015-11-28

## 2014-12-12 NOTE — Progress Notes (Signed)
Pre visit review using our clinic review tool, if applicable. No additional management support is needed unless otherwise documented below in the visit note. 

## 2014-12-12 NOTE — Patient Instructions (Signed)
  Your next office appointment will be determined based upon review of your pending labs. Those written interpretation of the lab results and instructions will be transmitted to you by mail for your records.  Critical results will be called.   Followup as needed for any active or acute issue. Please report any significant change in your symptoms. 

## 2014-12-12 NOTE — Progress Notes (Signed)
   Subjective:    Patient ID: Leonard [name], male    DOB: 08-01-1920, 81 y.o.   MRN: [id number]  HPI He went to urgent care 12/08/14 for pain in the right foot described as up to level X out of 10.He was diagnosed with acute gout &  given colchicine which has resulted in significant improvement. The pain is now described as a 6 out of 10 and he is able to sleep. He has no personal history of gout but it is in his family. He is on an ARB/HCT.   On Monday 7/4 he got dizzy after climbing stairs and vomited. There was no associated vertigo or neuro cardiac prodrome even though he has a PMH of vertigo.   Review of Systems Denied were any change in heart rhythm or rate prior to the event. There was no associated chest pain or shortness of breath .  Also specifically denied prior to the episode were headache, limb weakness, tingling, or numbness. No seizure activity noted.     Objective:   Physical Exam Pertinent or positive findings include: Appears much younger than his stated age. He has pattern alopecia. Pedal pulses are decreased, especially dorsalis pedis pulses. There is slight tenderness at the base of the right great toe. There is no associated erythema or increased temperature.   General appearance :adequately nourished; in no distress.  Eyes: No conjunctival inflammation or scleral icterus is present.  Heart:  Normal rate and regular rhythm. S1 and S2 normal without gallop, murmur, click, rub or other extra sounds    Lungs:Chest clear to auscultation; no wheezes, rhonchi,rales ,or rubs present.No increased work of breathing.   Abdomen: bowel sounds normal, soft and non-tender without masses, organomegaly or hernias noted.  No guarding or rebound.   Vascular : all pulses equal ; no bruits present.  Skin:Warm & dry.  Intact without suspicious lesions or rashes ; no tenting   Lymphatic: No lymphadenopathy is noted about the head, neck, axilla  Neuro: Strength, tone   normal.        Assessment & Plan:  #1 acute gout  Plan: See orders and recommendations

## 2014-12-17 ENCOUNTER — Telehealth: Payer: Self-pay | Admitting: Internal Medicine

## 2014-12-17 NOTE — Telephone Encounter (Signed)
Spoke with Daughter to clearifiy medications.

## 2014-12-17 NOTE — Telephone Encounter (Signed)
Pt daughter called in and has some questions about pt meds?  Can you give her a call when you get a chance?   Best number [phone]

## 2015-02-17 ENCOUNTER — Other Ambulatory Visit: Payer: Self-pay | Admitting: Internal Medicine

## 2015-02-17 ENCOUNTER — Other Ambulatory Visit: Payer: Self-pay | Admitting: Emergency Medicine

## 2015-02-17 MED ORDER — AMLODIPINE BESYLATE 10 MG PO TABS: 10.0000 mg | ORAL_TABLET | Freq: Every day | ORAL | Status: DC

## 2015-04-21 ENCOUNTER — Ambulatory Visit (INDEPENDENT_AMBULATORY_CARE_PROVIDER_SITE_OTHER): Payer: Medicare Other | Admitting: Internal Medicine

## 2015-04-21 ENCOUNTER — Encounter (HOSPITAL_COMMUNITY): Payer: Self-pay | Admitting: Emergency Medicine

## 2015-04-21 ENCOUNTER — Emergency Department (HOSPITAL_COMMUNITY): Payer: Medicare Other

## 2015-04-21 ENCOUNTER — Encounter: Payer: Self-pay | Admitting: Internal Medicine

## 2015-04-21 ENCOUNTER — Emergency Department (HOSPITAL_COMMUNITY)
Admission: EM | Admit: 2015-04-21 | Discharge: 2015-04-22 | Disposition: A | Discharge status: Home / Self Care | Payer: Medicare Other | Attending: Emergency Medicine | Admitting: Emergency Medicine

## 2015-04-21 ENCOUNTER — Other Ambulatory Visit: Payer: Self-pay

## 2015-04-21 VITALS — BP 118/62 | HR 72 | Temp 97.6°F | Ht 65.0 in | Wt 155.5 lb

## 2015-04-21 DIAGNOSIS — I10 Essential (primary) hypertension: Secondary | ICD-10-CM | POA: Diagnosis not present

## 2015-04-21 DIAGNOSIS — R42 Dizziness and giddiness: Secondary | ICD-10-CM

## 2015-04-21 DIAGNOSIS — Z794 Long term (current) use of insulin: Secondary | ICD-10-CM | POA: Insufficient documentation

## 2015-04-21 DIAGNOSIS — Z8739 Personal history of other diseases of the musculoskeletal system and connective tissue: Secondary | ICD-10-CM | POA: Insufficient documentation

## 2015-04-21 DIAGNOSIS — Z87448 Personal history of other diseases of urinary system: Secondary | ICD-10-CM | POA: Insufficient documentation

## 2015-04-21 DIAGNOSIS — Z7982 Long term (current) use of aspirin: Secondary | ICD-10-CM | POA: Diagnosis not present

## 2015-04-21 DIAGNOSIS — H409 Unspecified glaucoma: Secondary | ICD-10-CM | POA: Diagnosis not present

## 2015-04-21 DIAGNOSIS — Z951 Presence of aortocoronary bypass graft: Secondary | ICD-10-CM | POA: Insufficient documentation

## 2015-04-21 DIAGNOSIS — E1142 Type 2 diabetes mellitus with diabetic polyneuropathy: Secondary | ICD-10-CM | POA: Diagnosis not present

## 2015-04-21 DIAGNOSIS — R404 Transient alteration of awareness: Secondary | ICD-10-CM | POA: Diagnosis not present

## 2015-04-21 DIAGNOSIS — Z7952 Long term (current) use of systemic steroids: Secondary | ICD-10-CM | POA: Insufficient documentation

## 2015-04-21 DIAGNOSIS — Z9889 Other specified postprocedural states: Secondary | ICD-10-CM | POA: Insufficient documentation

## 2015-04-21 DIAGNOSIS — I2510 Atherosclerotic heart disease of native coronary artery without angina pectoris: Secondary | ICD-10-CM | POA: Insufficient documentation

## 2015-04-21 DIAGNOSIS — Z8719 Personal history of other diseases of the digestive system: Secondary | ICD-10-CM | POA: Insufficient documentation

## 2015-04-21 DIAGNOSIS — H9190 Unspecified hearing loss, unspecified ear: Secondary | ICD-10-CM | POA: Insufficient documentation

## 2015-04-21 DIAGNOSIS — Z8546 Personal history of malignant neoplasm of prostate: Secondary | ICD-10-CM | POA: Insufficient documentation

## 2015-04-21 DIAGNOSIS — Z862 Personal history of diseases of the blood and blood-forming organs and certain disorders involving the immune mechanism: Secondary | ICD-10-CM | POA: Diagnosis not present

## 2015-04-21 DIAGNOSIS — Z79899 Other long term (current) drug therapy: Secondary | ICD-10-CM | POA: Insufficient documentation

## 2015-04-21 LAB — I-STAT TROPONIN, ED: TROPONIN I, POC: 0.01 ng/mL (ref 0.00–0.08)

## 2015-04-21 LAB — BASIC METABOLIC PANEL
Anion gap: 9 (ref 5–15)
BUN: 15 mg/dL (ref 6–20)
CALCIUM: 9.1 mg/dL (ref 8.9–10.3)
CO2: 24 mmol/L (ref 22–32)
Chloride: 103 mmol/L (ref 101–111)
Creatinine, Ser: 1.4 mg/dL — ABNORMAL HIGH (ref 0.61–1.24)
GFR calc non Af Amer: 42 mL/min — ABNORMAL LOW (ref >60)
GFR, EST AFRICAN AMERICAN: 49 mL/min — AB (ref >60)
Glucose, Bld: 80 mg/dL (ref 65–99)
POTASSIUM: 3.7 mmol/L (ref 3.5–5.1)
SODIUM: 136 mmol/L (ref 135–145)

## 2015-04-21 LAB — CBC
HEMATOCRIT: 42.5 % (ref 39.0–52.0)
Hemoglobin: 14.5 g/dL (ref 13.0–17.0)
MCH: 29.2 pg (ref 26.0–34.0)
MCHC: 34.1 g/dL (ref 30.0–36.0)
MCV: 85.7 fL (ref 78.0–100.0)
Platelets: 127 10*3/uL — ABNORMAL LOW (ref 150–400)
RBC: 4.96 MIL/uL (ref 4.22–5.81)
RDW: 14.2 % (ref 11.5–15.5)
WBC: 5.4 10*3/uL (ref 4.0–10.5)

## 2015-04-21 LAB — CBG MONITORING, ED
Glucose-Capillary: 85 mg/dL (ref 65–99)
Glucose-Capillary: 190 mg/dL — ABNORMAL HIGH (ref 65–99)

## 2015-04-21 MED ORDER — MECLIZINE HCL 12.5 MG PO TABS: 12.5000 mg | ORAL_TABLET | Freq: Three times a day (TID) | ORAL | Status: DC | PRN

## 2015-04-21 MED ORDER — MECLIZINE HCL 25 MG PO TABS
12.5000 mg | ORAL_TABLET | Freq: Once | ORAL | Status: AC
  Administered 2015-04-21: 12.5 mg via ORAL
  Filled 2015-04-21: qty 1

## 2015-04-21 NOTE — Patient Instructions (Signed)
Go to ER; my note will be in the computer. Take copy of Dr Eugenio Hoes labs.

## 2015-04-21 NOTE — ED Notes (Addendum)
Per pt's caretaker pt was sent over from Dr [employer] office for stroke like symptoms that started around Thursday or Friday last week.  Pt's caretaker states that he has been dizziness since Friday and his voice is normal just tone is low.

## 2015-04-21 NOTE — ED Notes (Signed)
MRI called to state it will be approx 30 min before patient will be taken for test.

## 2015-04-21 NOTE — Progress Notes (Signed)
   Subjective:    Patient ID: Leonard [name], male    DOB: 15-Dec-1920, 81 y.o.   MRN: [id number]  HPI The history below was obtained with great difficulty. It is suspect as he is profoundly slow in his replies and he has difficulty following simple commands.  Apparently he has had dizziness since at least Thursday 04/17/15. Initially he said Saturday but his wife's caregiver contradicted this. He does not elaborate but simply says "I wasn't feeling good".   The episodes are related to standing when he arises from bed or chair. It resolves when he sits.  Symptoms are mainly lightheadedness. There is no frank vertigo or near syncope. The symptoms are not associated with a cardiac or neurologic prodrome. He denies any seizure activity.  He's had no upper respiratory tract infection symptoms.  He indicates he takes 6 of Lantus in am and 10 units before the evening meal ( he may be confused as to Humalog and Lantus). Recent labs done by his Endocrinologist 04/10/15 revealed creatinine 1.5; BUN 21; GFR 56.28; and A1c of 8%.  Review of Systems  Denied were any change in heart rhythm or rate prior to the event. There was no associated chest pain or shortness of breath .  Also specifically denied prior to the episode were headache, limb weakness, tingling, or numbness. No seizure activity noted.  Epistaxis, hemoptysis, hematuria, melena, or rectal bleeding denied. No unexplained weight loss, significant dyspepsia,dysphagia, or abdominal pain.  There is no abnormal bruising , bleeding, or difficulty stopping bleeding with injury.     Objective:   Physical Exam  Pertinent or positive findings include: He exhibits a blank stare.R nasolabial fold decreased. Speech is slightly slurred. As noted responses are profoundly slow. He was able to give the date but after only a minute. The same response was noted when he was asked his wedding anniversary. He has a loud left carotid bruit. Grade 1.5 systolic  murmur is noted at the base. Gait is broad and very unsteady. He almost fell getting on the  on exam table. He has trace edema of the ankles. When an attempt to check strength in the hands was attempted; he had difficulty following command of touching the thumb to the index finger. Strength was good to testing however. Supine blood pressure was 150/64; sitting 132/60; and standing 140/68.   General appearance :adequately nourished; in no distress.  Eyes: No conjunctival inflammation or scleral icterus is present.  Oral exam:  Lips and gums are healthy appearing.There is no oropharyngeal erythema or exudate noted. Upper partial present.   Heart:  Normal rate and regular rhythm. S1 and S2 normal without gallop,  click, rub or other extra sounds    Lungs:Chest clear to auscultation; no wheezes, rhonchi,rales ,or rubs present.No increased work of breathing.   Abdomen: bowel sounds normal, soft and non-tender without masses, organomegaly or hernias noted.  No guarding or rebound.   Vascular : all pulses equal ; no bruits present.  Skin:Warm & dry.  Intact without suspicious lesions or rashes ; no tenting .  Lymphatic: No lymphadenopathy is noted about the head, neck, axilla.       Assessment & Plan:  #1 dizziness; history suggests postural hypotension. The history is suspect as noted above  #2 profound mental status changes with slow responses and profoundly unsteady gait. Rule out CVA; less likely would be hypoglycemia.  Plan: It was recommended his family transport him to the emergency room for evaluation.

## 2015-04-21 NOTE — ED Provider Notes (Signed)
CSN: [id number]     Arrival date & time 04/21/15  1821 History   First MD Initiated Contact with Patient 04/21/15 1910     Chief Complaint  Patient presents with  . sent by Dr Linna Darner    . stroke like symptoms   . Dizziness     (Consider location/radiation/quality/duration/timing/severity/associated sxs/prior Treatment) The history is provided by the patient, the spouse and a relative.  KEITHAN GIOVANELLI is a 81 y.o. male hx of HTN, CAD, here with dizziness, altered mental status. As per family, patient has been intermittent dizzy for the last 3 days. States that it's worse when he turns his neck. Her blood sugar also has been running slightly lower as well. Eyes any fevers or chills or vomiting or chest pain or shortness of breath. He saw Dr. Linna Darner today, and sent here stroke workup.    Past Medical History  Diagnosis Date  . Hypertension   . Prostate cancer [facility]) 1996    s/p external radiation  . Carotid stenosis, bilateral per doppler 08-20-09    mild  . DDD (degenerative disc disease), lumbar     s/p fusion 4-5  . Renal insufficiency hx  . Coronary artery disease ?date last stress test    hx cabg and redo-- last visit cardiologist dr Olevia Perches 6 yrs ago, sees pcp dr Gwyndolyn Saxon hopper  . Diabetic peripheral neuropathy (HCC)     feet  . Diabetes mellitus     oral med and insuin  . Bladder tumor     hematuria  . Nocturia   . Glaucoma bilateral     eye drop rx  . HOH (hard of hearing) no hearing aids  . History of hiatal hernia     denies reflux  . Syncope 06/06/2011    In context of dehydration and bradycardia.   Past Surgical History  Procedure Laterality Date  . Upper gastrointestinal endoscopy      dx hiatal hernia  . Lumbar fusion  01/2004    L 4-5  . Coronary artery bypass graft  1984    4 vessels  . Coronary artery bypass graft  1997- redo    3 vessels  . Lipoma excision  1970's    from back  . Cataract extraction w/ intraocular lens  implant, bilateral    .  Cardiac catheterization  last cath 2005    per dr Olevia Perches- w/ chart (grafts patent)-  results w/ chart  . Transurethral resection of bladder tumor  04/16/2011    Procedure: TRANSURETHRAL RESECTION OF BLADDER TUMOR (TURBT);  Surgeon: Claybon Jabs;  Location: [facility];  Service: Urology;  Laterality: N/A;   Family History  Problem Relation Age of Onset  . Diabetes Sister   . Cancer Neg Hx   . Heart disease Neg Hx   . Stroke Neg Hx    Social History  Substance Use Topics  . Smoking status: Never Smoker   . Smokeless tobacco: Never Used  . Alcohol Use: No    Review of Systems  Neurological: Positive for dizziness.  All other systems reviewed and are negative.     Allergies  Codeine; Microzide; and Ace inhibitors  Home Medications   Prior to Admission medications   Medication Sig Start Date End Date Taking? Authorizing Provider  amLODipine (NORVASC) 10 MG tablet Take 1 tablet (10 mg total) by mouth daily. 02/17/15  Yes Hendricks Limes, MD  aspirin 81 MG tablet Take 81 mg by mouth daily.  Yes Historical Provider, MD  atorvastatin (LIPITOR) 40 MG tablet Take 40 mg by mouth daily.   Yes Historical Provider, MD  bimatoprost (LUMIGAN) 0.03 % ophthalmic drops Place 1 drop into both eyes at bedtime.    Yes Historical Provider, MD  fish oil-omega-3 fatty acids 1000 MG capsule Take 1 g by mouth 2 (two) times daily.    Yes Historical Provider, MD  gabapentin (NEURONTIN) 600 MG tablet Take 600 mg by mouth 3 (three) times daily.     Yes Historical Provider, MD  HYDROcodone-acetaminophen (NORCO) 5-325 MG per tablet Take 1 tablet by mouth every 6 (six) hours as needed for moderate pain. 06/06/14  Yes Freeman Caldron Baker, PA-C  insulin glargine (LANTUS) 100 UNIT/ML injection Inject 10 Units into the skin 2 (two) times daily.    Yes Historical Provider, MD  insulin lispro (HUMALOG) 100 UNIT/ML injection Inject 6-10 Units into the skin 2 (two) times daily before lunch and supper.  Per sliding scale.   Yes Historical Provider, MD  losartan (COZAAR) 100 MG tablet Take 1 tablet (100 mg total) by mouth daily. 12/12/14  Yes Hendricks Limes, MD  mirabegron ER (MYRBETRIQ) 25 MG TB24 tablet Take 25 mg by mouth daily.    Yes Historical Provider, MD  nitroGLYCERIN (NITROSTAT) 0.4 MG SL tablet Place 0.4 mg under the tongue every 5 (five) minutes as needed. For chest pain. Patient out of Rx  05/11/11  Yes Hendricks Limes, MD  ondansetron (ZOFRAN-ODT) 4 MG disintegrating tablet Take 1 tablet (4 mg total) by mouth every 6 (six) hours as needed for nausea. PRN for nausea or vomiting 06/06/14  Yes Liam Graham, PA-C  potassium chloride SA (K-DUR,KLOR-CON) 20 MEQ tablet Take 20 mEq by mouth 2 (two) times daily.   Yes Historical Provider, MD  predniSONE (DELTASONE) 10 MG tablet 1 tid pc prn gout 12/12/14  Yes Hendricks Limes, MD  sitaGLIPtan (JANUVIA) 100 MG tablet Take 50 mg by mouth daily.    Yes Historical Provider, MD  Tamsulosin HCl (FLOMAX) 0.4 MG CAPS Take 0.4 mg by mouth daily.   Yes Historical Provider, MD  colchicine 0.6 MG tablet Take 2 tablet x 1  Then 0.6 mg daily Patient not taking: Reported on 04/21/2015 12/09/14   Kinnie Feil, MD   BP 134/63 mmHg  Pulse 59  Temp(Src) 98.7 F (37.1 C) (Oral)  Resp 16  SpO2 95% Physical Exam  Constitutional: He is oriented to person, place, and time.  Chronically ill, NAD   HENT:  Head: Normocephalic.  Mouth/Throat: Oropharynx is clear and moist.  Eyes: Conjunctivae and EOM are normal. Pupils are equal, round, and reactive to light.  No obvious nystagmus   Neck: Normal range of motion. Neck supple.  Cardiovascular: Normal rate, regular rhythm and normal heart sounds.   Pulmonary/Chest: Effort normal and breath sounds normal. No respiratory distress. He has no wheezes. He has no rales.  Abdominal: Soft. Bowel sounds are normal. He exhibits no distension. There is no tenderness. There is no rebound.  Musculoskeletal: Normal range  of motion. He exhibits no edema or tenderness.  Neurological: He is alert and oriented to person, place, and time.  Demented, moving all extremities. ? Mild dysmetria L arm. Able to ambulate with assistance (baseline)   Skin: Skin is warm and dry.  Psychiatric:  Unable   Nursing note and vitals reviewed.   ED Course  Procedures (including critical care time) Labs Review Labs Reviewed  BASIC METABOLIC PANEL - Abnormal;  Notable for the following:    Creatinine, Ser 1.40 (*)    GFR calc non Af Amer 42 (*)    GFR calc Af Amer 49 (*)    All other components within normal limits  CBC - Abnormal; Notable for the following:    Platelets 127 (*)    All other components within normal limits  CBG MONITORING, ED - Abnormal; Notable for the following:    Glucose-Capillary 190 (*)    All other components within normal limits  URINALYSIS, ROUTINE W REFLEX MICROSCOPIC (NOT AT [facility])  CBG MONITORING, ED  I-STAT TROPOININ, ED  CBG MONITORING, ED    Imaging Review Ct Head Wo Contrast  04/21/2015  CLINICAL DATA:  Dizziness since last Thursday or Friday. EXAM: CT HEAD WITHOUT CONTRAST TECHNIQUE: Contiguous axial images were obtained from the base of the skull through the vertex without intravenous contrast. COMPARISON:  Head CT 07/10/2014 FINDINGS: Stable age related cerebral atrophy, cerebellar atrophy, ventriculomegaly and periventricular white matter disease. No extra-axial fluid collections are identified. No CT findings for acute hemispheric infarction or intracranial hemorrhage. No mass lesions. The brainstem and cerebellum are normal. No acute bony findings. No fracture or bone lesion. The paranasal sinuses and mastoid air cells are grossly clear. Chronic left maxillary sinus disease. The globes are intact. IMPRESSION: Stable age related cerebral and cerebellar atrophy, ventriculomegaly and periventricular white matter disease. No acute intracranial findings or mass lesions. Chronic left maxillary  sinus disease. Electronically Signed   By: Marijo Sanes M.D.   On: 04/21/2015 21:47   Mr Brain Wo Contrast  04/21/2015  CLINICAL DATA:  Initial evaluation for several day history of dizziness. Evaluate for posterior stroke. EXAM: MRI HEAD WITHOUT CONTRAST TECHNIQUE: Multiplanar, multiecho pulse sequences of the brain and surrounding structures were obtained without intravenous contrast. COMPARISON:  Prior CT from 07/10/2014. FINDINGS: Diffuse prominence of the CSF containing spaces is compatible with generalized age-related cerebral atrophy. Patchy and confluent T2/FLAIR hyperintensity within the periventricular and deep white matter of both cerebral hemispheres most consistent with chronic small vessel ischemic disease. This is moderate in nature. Multiple remote small remote infarcts present within the bilateral cerebellar hemispheres. No abnormal foci of restricted diffusion to suggest acute intracranial infarct. Gray-white matter differentiation maintained. There is abnormal flow void within the diminutive left vertebral artery, which may be related to occlusion or slow flow (Series 7, image 3). The dominant right vertebral artery demonstrates normal intravascular flow void as does the basilar artery. Remaining intravascular flow voids are also maintained. No acute or chronic intracranial hemorrhage. No mass lesion, midline shift, or mass effect. No hydrocephalus. There is a tiny chronic subdural hygroma overlying the anterior right frontal lobe measuring up to 4 mm (series 6, image 13), of doubtful clinical significance. No other extra-axial fluid collection. Craniocervical junction within normal limits. Mild degenerative spondylolysis present within the visualized upper cervical spine without significant stenosis. Incidental note made of a partially empty sella. No acute abnormality about the orbits. Sequela of prior bilateral lens extraction noted. Complete opacification of the left maxillary sinus,  which appears chronic in nature. Scattered mucosal thickening within the ethmoidal air cells. The Minimal opacity within the inferior left mastoid air cells. Inner ear structures grossly normal. Bone marrow signal intensity within normal limits. Scalp soft tissues unremarkable. IMPRESSION: 1. No acute intracranial infarct or other abnormality identified. 2. Multiple small remote infarcts involving the bilateral cerebellar hemispheres. 3. Abnormal flow void within the left vertebral artery, which may be related to slow  flow or occlusion. Remainder of the intravascular flow voids are maintained. 4. Moderate generalized age-related cerebral atrophy with chronic small vessel ischemic disease, progressed relative to most recent MRI from 2009. 5. Tiny chronic subdural hygroma overlying the anterior right cerebral convexity, similar relative to 2009. This finding is of doubtful clinical significance. 6. Chronic left maxillary sinus disease. Electronically Signed   By: Jeannine Boga M.D.   On: 04/21/2015 21:47   I have personally reviewed and evaluated these images and lab results as part of my medical decision-making.   EKG Interpretation None      MDM   Final diagnoses:  None    MAURIO COBERLEY is a 81 y.o. male here with dizziness. Consider electrolyte imbalance vs stroke vs hypoglycemia. His CBG was 70 on arrival. Will check labs, MRI brain.   10:41 PM MRI showed multiple old infarcts. Reviewed by Dr. Janann Colonel, neurology, who states that he has no acute findings to warrant further inpatient workup. Labs at baseline. Given meclizine and felt better. CBG is 190 after food. Stable for dc.    Wandra Arthurs, MD 04/21/15 [phone]

## 2015-04-21 NOTE — ED Notes (Addendum)
Patient transported to CT 

## 2015-04-21 NOTE — ED Notes (Signed)
Urine was collected in urinal and pink sediment was noted in the bottom of urinal.  Patient states he had "extra denture cream" and placed it in urinal.  Dr. Darl Householder notified and urine order cancelled.

## 2015-04-21 NOTE — Discharge Instructions (Signed)
Take meclizine for dizziness.   See Dr. Linna Darner.   Return to ER if you have worse dizziness, low or high blood sugar, passing out.

## 2015-04-21 NOTE — ED Notes (Signed)
Pt and family are aware of the need for urine sample, urinal at bedside.

## 2015-04-21 NOTE — Progress Notes (Signed)
Pre visit review using our clinic review tool, if applicable. No additional management support is needed unless otherwise documented below in the visit note. 

## 2015-04-21 NOTE — ED Notes (Signed)
CBG 85

## 2015-04-22 LAB — CBG MONITORING, ED: Glucose-Capillary: 74 mg/dL (ref 65–99)
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
## 2015-05-08 ENCOUNTER — Ambulatory Visit (INDEPENDENT_AMBULATORY_CARE_PROVIDER_SITE_OTHER): Payer: Medicare Other | Admitting: Internal Medicine

## 2015-05-08 ENCOUNTER — Encounter: Payer: Self-pay | Admitting: Internal Medicine

## 2015-05-08 VITALS — BP 138/82 | HR 74 | Temp 98.6°F | Resp 20 | Ht 66.0 in | Wt 157.5 lb

## 2015-05-08 DIAGNOSIS — E1165 Type 2 diabetes mellitus with hyperglycemia: Secondary | ICD-10-CM

## 2015-05-08 DIAGNOSIS — R42 Dizziness and giddiness: Secondary | ICD-10-CM

## 2015-05-08 DIAGNOSIS — E1122 Type 2 diabetes mellitus with diabetic chronic kidney disease: Secondary | ICD-10-CM

## 2015-05-08 DIAGNOSIS — I951 Orthostatic hypotension: Secondary | ICD-10-CM

## 2015-05-08 DIAGNOSIS — Z794 Long term (current) use of insulin: Secondary | ICD-10-CM

## 2015-05-08 DIAGNOSIS — R404 Transient alteration of awareness: Secondary | ICD-10-CM | POA: Diagnosis not present

## 2015-05-08 DIAGNOSIS — I679 Cerebrovascular disease, unspecified: Secondary | ICD-10-CM | POA: Diagnosis not present

## 2015-05-08 MED ORDER — MECLIZINE HCL 12.5 MG PO TABS: 12.5000 mg | ORAL_TABLET | Freq: Three times a day (TID) | ORAL | Status: DC | PRN

## 2015-05-08 NOTE — Progress Notes (Signed)
   Subjective:    Patient ID: Leonard [name], male    DOB: 1920/08/01, 81 y.o.   MRN: [id number]  HPI He was was sent to the emergency room 04/21/15 with marked mental status changes and severe imbalance. The latter apparently is more positional. The mental status changes were attributed to hypoglycemia; this has recurred in the past. He has not followed up with Dr.Kohut yet to assess his diabetic regimen to prevent hypoglycemia.  In the emergency room CT scan revealed age-related atrophy and white matter disease. MRA did reveal multiple small infarcts in the cerebellar areas bilaterally. There is also abnormal flow in the left vertebral artery.  He was given 10 meclizine 12.5 which he has completed since that ER discharge 11/15.  Review of Systems No fever, chills, significant weight change, fatigue, weakness or night sweats. No blurred vision, double vision, or loss of vision No headache,  limb weakness, tremor, seizures, memory loss, numbness or tingling     Objective:   Physical Exam Pertinent or positive findings include: He is alert and oriented. He ambulates slowly limping on the left lower extremity. Gait is slightly broad. There is asymmetry of the nasolabial fold; the left is decreased. He has an upper partial. Grade 1 systolic murmur is noted. Left greater than right carotid bruits are present. Reflexes are 0+ at the knees. Strength is equal and normal in upper and lower extremities.  General appearance :Thin but adequately nourished; in no distress.  Eyes: No conjunctival inflammation or scleral icterus is present.  Oral exam:  Lips and gums are healthy appearing.There is no oropharyngeal erythema or exudate noted. Dental hygiene is good.  Heart:  Normal rate and regular rhythm. S1 and S2 normal without gallop, click, rub or other extra sounds    Lungs:Chest clear to auscultation; no wheezes, rhonchi,rales ,or rubs present.No increased work of breathing.    Vascular : all  pulses equal   Skin:Warm & dry.  Intact without suspicious lesions or rashes ; no tenting    Lymphatic: No lymphadenopathy is noted about the head, neck, axilla.   Neuro:  DTRs normal.      Assessment & Plan:  #1 imbalance; this may be related to postural hypotension in the context of significant cerebrovascular disease  #2 mental status changes, resolved. This was most likely related to hypoglycemia.  Plan: Isometric exercises prior to standing will be recommended. If the dizziness progresses Neurology referral indicated. He's been asked to see his Endocrinologist to reassess his diabetic control and to prevent hypoglycemia which would basically act like a TIA. I have recommended that he not drive until this is completed. This has been a recurrent recommendation.

## 2015-05-08 NOTE — Progress Notes (Signed)
Pre visit review using our clinic review tool, if applicable. No additional management support is needed unless otherwise documented below in the visit note. 

## 2015-05-08 NOTE — Patient Instructions (Signed)
Perform isometric exercise of calves  ( while seated go up on toes to count of 5 & then onto heels for 5 count). Repeat  4- 5 times prior to standing if you've been seated or supine for any significant period of time as BP drops with such positions.  

## 2015-05-15 ENCOUNTER — Other Ambulatory Visit: Payer: Self-pay | Admitting: Internal Medicine

## 2015-06-04 ENCOUNTER — Other Ambulatory Visit: Payer: Self-pay | Admitting: Internal Medicine

## 2015-06-19 ENCOUNTER — Encounter (HOSPITAL_COMMUNITY): Payer: Self-pay | Admitting: Emergency Medicine

## 2015-06-19 ENCOUNTER — Emergency Department (INDEPENDENT_AMBULATORY_CARE_PROVIDER_SITE_OTHER)
Admission: EM | Admit: 2015-06-19 | Discharge: 2015-06-19 | Disposition: A | Discharge status: Home / Self Care | Payer: Medicare Other | Source: Home / Self Care | Attending: Family Medicine | Admitting: Family Medicine

## 2015-06-19 ENCOUNTER — Emergency Department (INDEPENDENT_AMBULATORY_CARE_PROVIDER_SITE_OTHER): Payer: Medicare Other

## 2015-06-19 DIAGNOSIS — R05 Cough: Secondary | ICD-10-CM | POA: Diagnosis not present

## 2015-06-19 DIAGNOSIS — J3489 Other specified disorders of nose and nasal sinuses: Secondary | ICD-10-CM | POA: Diagnosis not present

## 2015-06-19 DIAGNOSIS — R059 Cough, unspecified: Secondary | ICD-10-CM

## 2015-06-19 NOTE — Discharge Instructions (Signed)
Cough, Adult It is likely that your cough is due to post nasal drainage. Your chest x-ray is clear. No signs of active disease. Recommend taking either Allegra, Claritin or Zyrtec to help with drainage. May take Robitussin DM to help with cough as well. 4 development of shortness of breath, fever, worsening cough or other symptoms see her primary care doctor or go to the emergency department. Coughing is a reflex that clears your throat and your airways. Coughing helps to heal and protect your lungs. It is normal to cough occasionally, but a cough that happens with other symptoms or lasts a long time may be a sign of a condition that needs treatment. A cough may last only 2-3 weeks (acute), or it may last longer than 8 weeks (chronic). CAUSES Coughing is commonly caused by:  Breathing in substances that irritate your lungs.  A viral or bacterial respiratory infection.  Allergies.  Asthma.  Postnasal drip.  Smoking.  Acid backing up from the stomach into the esophagus (gastroesophageal reflux).  Certain medicines.  Chronic lung problems, including COPD (or rarely, lung cancer).  Other medical conditions such as heart failure. HOME CARE INSTRUCTIONS  Pay attention to any changes in your symptoms. Take these actions to help with your discomfort:  Take medicines only as told by your health care provider.  If you were prescribed an antibiotic medicine, take it as told by your health care provider. Do not stop taking the antibiotic even if you start to feel better.  Talk with your health care provider before you take a cough suppressant medicine.  Drink enough fluid to keep your urine clear or pale yellow.  If the air is dry, use a cold steam vaporizer or humidifier in your bedroom or your home to help loosen secretions.  Avoid anything that causes you to cough at work or at home.  If your cough is worse at night, try sleeping in a semi-upright position.  Avoid cigarette smoke. If  you smoke, quit smoking. If you need help quitting, ask your health care provider.  Avoid caffeine.  Avoid alcohol.  Rest as needed. SEEK MEDICAL CARE IF:   You have new symptoms.  You cough up pus.  Your cough does not get better after 2-3 weeks, or your cough gets worse.  You cannot control your cough with suppressant medicines and you are losing sleep.  You develop pain that is getting worse or pain that is not controlled with pain medicines.  You have a fever.  You have unexplained weight loss.  You have night sweats. SEEK IMMEDIATE MEDICAL CARE IF:  You cough up blood.  You have difficulty breathing.  Your heartbeat is very fast.   This information is not intended to replace advice given to you by your health care provider. Make sure you discuss any questions you have with your health care provider.   Document Released: 11/20/2010 Document Revised: 02/12/2015 Document Reviewed: 07/31/2014 Elsevier Interactive Patient Education [employer].

## 2015-06-19 NOTE — ED Provider Notes (Signed)
CSN: [id number]     Arrival date & time 06/19/15  1405 History   First MD Initiated Contact with Patient 06/19/15 1505     Chief Complaint  Patient presents with  . Cough   (Consider location/radiation/quality/duration/timing/severity/associated sxs/prior Treatment) HPI Comments: 82 year old male presents to the urgent care with a complaint of a cough since last evening. He denies fever, shortness of breath, chest pain, PND or sore throat. He states he does have chest wall pain upon coughing only. The cough is worse when supine. A little better when sitting up.   Past Medical History  Diagnosis Date  . Hypertension   . Prostate cancer [facility]) 1996    s/p external radiation  . Carotid stenosis, bilateral per doppler 08-20-09    mild  . DDD (degenerative disc disease), lumbar     s/p fusion 4-5  . Renal insufficiency hx  . Coronary artery disease ?date last stress test    hx cabg and redo-- last visit cardiologist dr Olevia Perches 6 yrs ago, sees pcp dr Gwyndolyn Saxon hopper  . Diabetic peripheral neuropathy (HCC)     feet  . Diabetes mellitus     oral med and insuin  . Bladder tumor     hematuria  . Nocturia   . Glaucoma bilateral     eye drop rx  . HOH (hard of hearing) no hearing aids  . History of hiatal hernia     denies reflux  . Syncope 06/06/2011    In context of dehydration and bradycardia.   Past Surgical History  Procedure Laterality Date  . Upper gastrointestinal endoscopy      dx hiatal hernia  . Lumbar fusion  01/2004    L 4-5  . Coronary artery bypass graft  1984    4 vessels  . Coronary artery bypass graft  1997- redo    3 vessels  . Lipoma excision  1970's    from back  . Cataract extraction w/ intraocular lens  implant, bilateral    . Cardiac catheterization  last cath 2005    per dr Olevia Perches- w/ chart (grafts patent)-  results w/ chart  . Transurethral resection of bladder tumor  04/16/2011    Procedure: TRANSURETHRAL RESECTION OF BLADDER TUMOR (TURBT);  Surgeon:  Claybon Jabs;  Location: [place];  Service: Urology;  Laterality: N/A;   Family History  Problem Relation Age of Onset  . Diabetes Sister   . Cancer Neg Hx   . Heart disease Neg Hx   . Stroke Neg Hx    Social History  Substance Use Topics  . Smoking status: Never Smoker   . Smokeless tobacco: Never Used  . Alcohol Use: No    Review of Systems  Constitutional: Negative for fever, activity change and fatigue.  HENT: Negative for congestion, drooling, ear discharge, ear pain, postnasal drip, rhinorrhea and sore throat.   Respiratory: Positive for cough. Negative for chest tightness, shortness of breath and wheezing.   Cardiovascular:       Chest pain only associated with the active coughing.  Gastrointestinal: Negative.   Musculoskeletal: Negative.   Skin: Negative.   Neurological: Negative.     Allergies  Codeine; Microzide; and Ace inhibitors  Home Medications   Prior to Admission medications   Medication Sig Start Date End Date Taking? Authorizing Provider  amLODipine (NORVASC) 10 MG tablet Take 1 tablet (10 mg total) by mouth daily. 02/17/15   Hendricks Limes, MD  aspirin 81 MG tablet Take  81 mg by mouth daily.     Historical Provider, MD  atorvastatin (LIPITOR) 40 MG tablet Take 40 mg by mouth daily.    Historical Provider, MD  bimatoprost (LUMIGAN) 0.03 % ophthalmic drops Place 1 drop into both eyes at bedtime.     Historical Provider, MD  colchicine 0.6 MG tablet Take 2 tablet x 1  Then 0.6 mg daily 12/09/14   Kinnie Feil, MD  fish oil-omega-3 fatty acids 1000 MG capsule Take 1 g by mouth 2 (two) times daily.     Historical Provider, MD  gabapentin (NEURONTIN) 600 MG tablet Take 600 mg by mouth 3 (three) times daily.      Historical Provider, MD  HYDROcodone-acetaminophen (NORCO) 5-325 MG per tablet Take 1 tablet by mouth every 6 (six) hours as needed for moderate pain. 06/06/14   Freeman Caldron Baker, PA-C  insulin glargine (LANTUS) 100 UNIT/ML  injection Inject 10 Units into the skin 2 (two) times daily.     Historical Provider, MD  insulin lispro (HUMALOG) 100 UNIT/ML injection Inject 6-10 Units into the skin 2 (two) times daily before lunch and supper. Per sliding scale.    Historical Provider, MD  losartan (COZAAR) 100 MG tablet TAKE 1 TABLET BY MOUTH DAILY 06/04/15   Hendricks Limes, MD  meclizine (ANTIVERT) 12.5 MG tablet TAKE 1 TABLET BY MOUTH 3 TIMES DAILY AS NEEDED 05/15/15   Hendricks Limes, MD  mirabegron ER (MYRBETRIQ) 25 MG TB24 tablet Take 25 mg by mouth daily.     Historical Provider, MD  nitroGLYCERIN (NITROSTAT) 0.4 MG SL tablet Place 0.4 mg under the tongue every 5 (five) minutes as needed. For chest pain. Patient out of Rx  05/11/11   Hendricks Limes, MD  ondansetron (ZOFRAN-ODT) 4 MG disintegrating tablet Take 1 tablet (4 mg total) by mouth every 6 (six) hours as needed for nausea. PRN for nausea or vomiting 06/06/14   Liam Graham, PA-C  potassium chloride SA (K-DUR,KLOR-CON) 20 MEQ tablet Take 20 mEq by mouth 2 (two) times daily.    Historical Provider, MD  predniSONE (DELTASONE) 10 MG tablet 1 tid pc prn gout 12/12/14   Hendricks Limes, MD  sitaGLIPtan (JANUVIA) 100 MG tablet Take 50 mg by mouth daily.     Historical Provider, MD  Tamsulosin HCl (FLOMAX) 0.4 MG CAPS Take 0.4 mg by mouth daily.    Historical Provider, MD   Meds Ordered and Administered this Visit  Medications - No data to display  BP 170/70 mmHg  Pulse 72  Temp(Src) 99.3 F (37.4 C) (Oral)  Resp 16  SpO2 97% No data found.   Physical Exam  Constitutional: He appears well-developed and well-nourished. No distress.  HENT:  Bilateral TMs are normal Oropharynx with minor erythema, cobblestoning and moderate amount of clear PND.  Eyes: EOM are normal.  Neck: Normal range of motion. Neck supple.  Cardiovascular: Normal rate, regular rhythm and normal heart sounds.   Pulmonary/Chest: Effort normal and breath sounds normal. No respiratory  distress. He has no wheezes. He has no rales.  Lymphadenopathy:    He has no cervical adenopathy.  Neurological: He is alert. He exhibits normal muscle tone. Coordination normal.  Skin: Skin is warm and dry.  Psychiatric: He has a normal mood and affect.  Nursing note and vitals reviewed.   ED Course  Procedures (including critical care time)  Labs Review Labs Reviewed - No data to display  Imaging Review Dg Chest 2 View  06/19/2015  CLINICAL DATA:  Cough since yesterday with chest soreness when coughing. EXAM: CHEST  2 VIEW COMPARISON:  Radiographs 07/09/2014 and 10/22/2013. FINDINGS: There is stable cardiomegaly status post median sternotomy and CABG. The overall pulmonary aeration has improved. The pulmonary vascularity is normal. There is no edema, confluent airspace opacity or pleural effusion. Splenic artery calcifications noted. The bones appear unchanged. IMPRESSION: No active cardiopulmonary process post CABG. Stable mild cardiomegaly. Electronically Signed   By: Richardean Sale M.D.   On: 06/19/2015 16:15     Visual Acuity Review  Right Eye Distance:   Left Eye Distance:   Bilateral Distance:    Right Eye Near:   Left Eye Near:    Bilateral Near:         MDM   1. Cough   2. Sinus drainage    It is likely that your cough is due to post nasal drainage. Your chest x-ray is clear. No signs of active disease. Recommend taking either Allegra, Claritin or Zyrtec to help with drainage. May take Robitussin DM to help with cough as well. 4 development of shortness of breath, fever, worsening cough or other symptoms see her primary care doctor or go to the emergency department.     Janne Napoleon, NP 06/19/15 [phone]

## 2015-06-19 NOTE — ED Notes (Addendum)
Reports onset of cough that started last night after lying down. General torso soreness with coughing.   Patient denies any other cold symptoms or pain.  Palpated regular pulse

## 2015-08-20 ENCOUNTER — Other Ambulatory Visit: Payer: Self-pay | Admitting: Internal Medicine

## 2015-11-28 ENCOUNTER — Encounter: Payer: Self-pay | Admitting: Internal Medicine

## 2015-11-28 ENCOUNTER — Ambulatory Visit (INDEPENDENT_AMBULATORY_CARE_PROVIDER_SITE_OTHER): Payer: Medicare Other | Admitting: Internal Medicine

## 2015-11-28 VITALS — BP 154/60 | HR 66 | Temp 98.1°F | Resp 20 | Ht 66.0 in | Wt 157.4 lb

## 2015-11-28 DIAGNOSIS — Z8673 Personal history of transient ischemic attack (TIA), and cerebral infarction without residual deficits: Secondary | ICD-10-CM

## 2015-11-28 DIAGNOSIS — N183 Chronic kidney disease, stage 3 unspecified: Secondary | ICD-10-CM

## 2015-11-28 DIAGNOSIS — N189 Chronic kidney disease, unspecified: Secondary | ICD-10-CM

## 2015-11-28 DIAGNOSIS — R42 Dizziness and giddiness: Secondary | ICD-10-CM

## 2015-11-28 DIAGNOSIS — M5441 Lumbago with sciatica, right side: Secondary | ICD-10-CM

## 2015-11-28 DIAGNOSIS — I10 Essential (primary) hypertension: Secondary | ICD-10-CM

## 2015-11-28 MED ORDER — HYDROCODONE-ACETAMINOPHEN 5-325 MG PO TABS: 1.0000 | ORAL_TABLET | Freq: Four times a day (QID) | ORAL | Status: DC | PRN

## 2015-11-28 NOTE — Assessment & Plan Note (Signed)
Getting records but last Cr with Stage 3 CKD. Getting records from Dr. Wilson Singer for latest and he declines labs today since getting there.

## 2015-11-28 NOTE — Assessment & Plan Note (Signed)
Rx for hydrocodone #15 no refills. Last fill for #30 in Dec 2015.

## 2015-11-28 NOTE — Assessment & Plan Note (Signed)
Taking aspirin daily and on statin. His BP is at goal typically. Getting records from Dr. Wilson Singer regarding his level of control of his sugars.

## 2015-11-28 NOTE — Patient Instructions (Signed)
It is okay to stop taking the potassium pills. When Dr. Wilson Singer checks the labs let them know that you are off this medicine.   We have given you the refill of the pain medicine. It is okay to take 1/2 pill of this for pain.

## 2015-11-28 NOTE — Assessment & Plan Note (Signed)
Taking meclizine only prn and we discussed the risk of taking when he is not having vertigo and he does not take this medicine.

## 2015-11-28 NOTE — Progress Notes (Signed)
   Subjective:    Patient ID: Leonard [name], male    DOB: Sep 16, 1920, 82 y.o.   MRN: [id number]  HPI The patient is a 82 YO man coming in for transition of care and continuation of his medical problems including his blood pressure (slightly above goal today on his losartan, amlodipine, flomax, complicated by CKD stage 3, per patient at goal at home, no side effects) and his CKD stage 3 (has diabetes and hypertension and it sounds like his sugars have been up and down over the years, most recent Cr stable and he gets labs from his endocrinologist), and his back pain (has had low back pain off and on over the years, takes rare hydrocodone, last filled in 2015 and needs refills today). No recent injury and denies falls. No low sugars recently and seeing Dr. Wilson Singer regularly.   Review of Systems  Constitutional: Negative for fever, activity change, appetite change, fatigue and unexpected weight change.  HENT: Negative.   Eyes: Negative.   Respiratory: Negative for cough, chest tightness and shortness of breath.   Cardiovascular: Negative for chest pain, palpitations and leg swelling.  Gastrointestinal: Negative for abdominal pain, diarrhea, constipation and abdominal distention.  Musculoskeletal: Positive for arthralgias. Negative for myalgias, back pain, gait problem and neck pain.  Skin: Negative.   Neurological: Negative for dizziness, weakness, light-headedness and headaches.  Psychiatric/Behavioral: Negative.       Objective:   Physical Exam  Constitutional: He is oriented to person, place, and time. He appears well-developed and well-nourished.  HENT:  Head: Normocephalic and atraumatic.  Right Ear: External ear normal.  Left Ear: External ear normal.  Eyes: EOM are normal.  Neck: Normal range of motion.  Cardiovascular: Normal rate and regular rhythm.   Pulmonary/Chest: Effort normal and breath sounds normal. No respiratory distress. He has no wheezes. He has no rales.  Abdominal:  Soft. He exhibits no distension. There is no tenderness. There is no rebound.  Musculoskeletal: He exhibits no edema.  Neurological: He is alert and oriented to person, place, and time. Coordination normal.  Skin: Skin is warm and dry.  Psychiatric: He has a normal mood and affect.   Filed Vitals:   11/28/15 1507  BP: 154/60  Pulse: 66  Temp: 98.1 F (36.7 C)  TempSrc: Oral  Resp: 20  Height: 5\' 6"  (1.676 m)  Weight: 157 lb 6.4 oz (71.396 kg)  SpO2: 98%      Assessment & Plan:

## 2015-11-28 NOTE — Assessment & Plan Note (Signed)
BP slightly elevated. Taking amlodipine and losartan. Suspect he was on hctz in the past and still on potassium. Will stop and have Dr. Wilson Singer check labs in 2 months and if k normal okay to stay off. Written these instructions for patient.

## 2015-12-01 ENCOUNTER — Other Ambulatory Visit: Payer: Self-pay | Admitting: *Deleted

## 2015-12-01 MED ORDER — LOSARTAN POTASSIUM 100 MG PO TABS: 100.0000 mg | ORAL_TABLET | Freq: Every day | ORAL | Status: DC

## 2016-02-08 ENCOUNTER — Other Ambulatory Visit: Payer: Self-pay | Admitting: Internal Medicine

## 2016-03-23 ENCOUNTER — Encounter: Payer: Self-pay | Admitting: Internal Medicine

## 2016-03-23 ENCOUNTER — Ambulatory Visit (INDEPENDENT_AMBULATORY_CARE_PROVIDER_SITE_OTHER): Payer: Medicare Other | Admitting: Internal Medicine

## 2016-03-23 DIAGNOSIS — M25562 Pain in left knee: Secondary | ICD-10-CM | POA: Diagnosis not present

## 2016-03-23 MED ORDER — DICLOFENAC SODIUM 1 % TD GEL: 2.0000 g | Freq: Four times a day (QID) | TRANSDERMAL | 3 refills | Status: DC

## 2016-03-23 NOTE — Patient Instructions (Signed)
We have sent in the gel medicine for the knee which you can use up to 3 times per day for the discomfort and stability.   If you are having worsening problems or this does not help call the office back.

## 2016-03-23 NOTE — Progress Notes (Signed)
Pre visit review using our clinic review tool, if applicable. No additional management support is needed unless otherwise documented below in the visit note. 

## 2016-03-24 DIAGNOSIS — M25562 Pain in left knee: Secondary | ICD-10-CM | POA: Insufficient documentation

## 2016-03-24 NOTE — Progress Notes (Signed)
   Subjective:    Patient ID: Leonard [name], male    DOB: [birth year], 82 y.o.   MRN: [id number]  HPI The patient is a 82 YO man coming in for left knee pain. S/P old injury to the area many years ago. No recent falls or injury. He has felt some soreness in the side of the knee. No collapse but some feeling of instability. 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 Has not tried anything for pain.   Review of Systems  Constitutional: Negative.   Respiratory: Negative.   Cardiovascular: Negative.   Musculoskeletal: Positive for arthralgias. Negative for back pain, gait problem, joint swelling and myalgias.  Skin: Negative.   Neurological: Negative.      Objective:   Physical Exam  Constitutional: He is oriented to person, place, and time. He appears well-developed and well-nourished.  HENT:  Head: Normocephalic and atraumatic.  Eyes: EOM are normal.  Cardiovascular: Normal rate and regular rhythm.   Pulmonary/Chest: Effort normal. No respiratory distress. He has no wheezes. He has no rales.  Abdominal: Soft.  Musculoskeletal:  Left knee with some tenderness lateral, ACL and PCL intact and no swelling in the joint.   Neurological: He is alert and oriented to person, place, and time. Coordination abnormal.  Some instability and slowness to the gait.   Skin: Skin is warm and dry.   Vitals:   03/23/16 1610  BP: 130/70  Pulse: 66  Resp: 18  Temp: 98.1 F (36.7 C)  TempSrc: Oral  SpO2: 97%  Weight: 155 lb (70.3 kg)  Height: 5\' 6"  (1.676 m)      Assessment & Plan:

## 2016-03-24 NOTE — Assessment & Plan Note (Addendum)
Rx for voltaren gel to help with the pain. Likely from osteoarthritis, no recent fall or indication for imaging.

## 2016-04-04 ENCOUNTER — Ambulatory Visit (INDEPENDENT_AMBULATORY_CARE_PROVIDER_SITE_OTHER): Payer: Medicare Other

## 2016-04-04 ENCOUNTER — Encounter (HOSPITAL_COMMUNITY): Payer: Self-pay | Admitting: *Deleted

## 2016-04-04 ENCOUNTER — Ambulatory Visit (HOSPITAL_COMMUNITY)
Admission: EM | Admit: 2016-04-04 | Discharge: 2016-04-04 | Disposition: A | Discharge status: Home / Self Care | Payer: Medicare Other | Attending: Emergency Medicine | Admitting: Emergency Medicine

## 2016-04-04 DIAGNOSIS — M25562 Pain in left knee: Secondary | ICD-10-CM | POA: Diagnosis not present

## 2016-04-04 DIAGNOSIS — G8929 Other chronic pain: Secondary | ICD-10-CM

## 2016-04-04 NOTE — ED Provider Notes (Signed)
HPI  SUBJECTIVE:  Leonard [name] is a 82 y.o. male who presents with worsening left [name] pain for the past 3-4 months. He states is sharp, intermittent, lasting minutes. It does not radiate. He states it is worse with walking, he does not use a cane or walker on a regular basis although he states that he does have a cane at home. Symptoms are better with Voltaren gel that was prescribed to him by his PMD on 10/17. He has not yet gotten imaging of this [name]. He reports swelling. He denies erythema, numbness, tingling, bruising, fevers, bodyaches, change in physical activity, trauma to her [name]. No limitation of motion. Patient denies any previous history of injury to this [name]. Patient has an extensive past medical history including diabetes, stroke, hypertension, chronic kidney disease, stage III, gout. PMD: Dr. Sharlet Salina.  Patient saw his PMD on 10/17 for the same, was prescribed diclofenac gel 3 times a day. No imaging was done at that time.  PMD: Dr. Pricilla Holm.  Past Medical History:  Diagnosis Date  . Bladder tumor    hematuria  . Carotid stenosis, bilateral per doppler 08-20-09   mild  . Coronary artery disease ?date last stress test   hx cabg and redo-- last visit cardiologist dr Olevia Perches 6 yrs ago, sees pcp dr Gwyndolyn Saxon hopper  . DDD (degenerative disc disease), lumbar    s/p fusion 4-5  . Diabetes mellitus    oral med and insuin  . Diabetic peripheral neuropathy (HCC)    feet  . Glaucoma bilateral    eye drop rx  . History of hiatal hernia    denies reflux  . HOH (hard of hearing) no hearing aids  . Hypertension   . Nocturia   . Prostate cancer [facility]) 1996   s/p external radiation  . Renal insufficiency hx  . Syncope 06/06/2011   In context of dehydration and bradycardia.    Past Surgical History:  Procedure Laterality Date  . CARDIAC CATHETERIZATION  last cath 2005   per dr Olevia Perches- w/ chart (grafts patent)-  results w/ chart  . CATARACT EXTRACTION W/ INTRAOCULAR  LENS  IMPLANT, BILATERAL    . CORONARY ARTERY BYPASS GRAFT  1984   4 vessels  . CORONARY ARTERY BYPASS GRAFT  1997- redo   3 vessels  . LIPOMA EXCISION  1970's   from back  . LUMBAR FUSION  01/2004   L 4-5  . TRANSURETHRAL RESECTION OF BLADDER TUMOR  04/16/2011   Procedure: TRANSURETHRAL RESECTION OF BLADDER TUMOR (TURBT);  Surgeon: Claybon Jabs;  Location: [place];  Service: Urology;  Laterality: N/A;  . UPPER GASTROINTESTINAL ENDOSCOPY     dx hiatal hernia    Family History  Problem Relation Age of Onset  . Diabetes Sister   . Cancer Neg Hx   . Heart disease Neg Hx   . Stroke Neg Hx     Social History  Substance Use Topics  . Smoking status: Never Smoker  . Smokeless tobacco: Never Used  . Alcohol use No    No current facility-administered medications for this encounter.   Current Outpatient Prescriptions:  .  amLODipine (NORVASC) 10 MG tablet, TAKE 1 TABLET (10 MG TOTAL) BY MOUTH DAILY., Disp: 90 tablet, Rfl: 1 .  aspirin 81 MG tablet, Take 81 mg by mouth daily. , Disp: , Rfl:  .  atorvastatin (LIPITOR) 40 MG tablet, Take 40 mg by mouth daily., Disp: , Rfl:  .  bimatoprost (LUMIGAN) 0.03 %  ophthalmic drops, Place 1 drop into both eyes at bedtime. , Disp: , Rfl:  .  colchicine 0.6 MG tablet, Take 2 tablet x 1  Then 0.6 mg daily, Disp: 5 tablet, Rfl: 0 .  diclofenac sodium (VOLTAREN) 1 % GEL, Apply 2 g topically 4 (four) times daily., Disp: 100 g, Rfl: 3 .  fish oil-omega-3 fatty acids 1000 MG capsule, Take 1 g by mouth 2 (two) times daily. , Disp: , Rfl:  .  gabapentin (NEURONTIN) 600 MG tablet, Take 600 mg by mouth 3 (three) times daily.  , Disp: , Rfl:  .  HYDROcodone-acetaminophen (NORCO) 5-325 MG tablet, Take 1 tablet by mouth every 6 (six) hours as needed for moderate pain., Disp: 15 tablet, Rfl: 0 .  insulin glargine (LANTUS) 100 UNIT/ML injection, Inject 10 Units into the skin 2 (two) times daily. , Disp: , Rfl:  .  insulin lispro (HUMALOG) 100  UNIT/ML injection, Inject 6-10 Units into the skin 2 (two) times daily before lunch and supper. Per sliding scale., Disp: , Rfl:  .  losartan (COZAAR) 100 MG tablet, Take 1 tablet (100 mg total) by mouth daily., Disp: 90 tablet, Rfl: 1 .  meclizine (ANTIVERT) 12.5 MG tablet, TAKE 1 TABLET BY MOUTH 3 TIMES DAILY AS NEEDED (Patient not taking: Reported on 03/23/2016), Disp: 15 tablet, Rfl: 0 .  mirabegron ER (MYRBETRIQ) 25 MG TB24 tablet, Take 25 mg by mouth daily. , Disp: , Rfl:  .  nitroGLYCERIN (NITROSTAT) 0.4 MG SL tablet, Place 0.4 mg under the tongue every 5 (five) minutes as needed. For chest pain. Patient out of Rx , Disp: , Rfl:  .  ondansetron (ZOFRAN-ODT) 4 MG disintegrating tablet, Take 1 tablet (4 mg total) by mouth every 6 (six) hours as needed for nausea. PRN for nausea or vomiting, Disp: 30 tablet, Rfl: 0 .  sitaGLIPtan (JANUVIA) 100 MG tablet, Take 50 mg by mouth daily. , Disp: , Rfl:  .  Tamsulosin HCl (FLOMAX) 0.4 MG CAPS, Take 0.4 mg by mouth daily., Disp: , Rfl:   Allergies  Allergen Reactions  . Codeine Other (See Comments)    hallucinations  . Microzide [Hydrochlorothiazide]     Acute gout on LosartanHCT 12/2014  . Ace Inhibitors Other (See Comments)     cough     ROS  As noted in HPI.   Physical Exam  BP 162/74 (BP Location: Right Arm)   Pulse 78   Temp 98.6 F (37 C) (Oral)   Resp 18   SpO2 99%   Constitutional: Well developed, well nourished, no acute distress Eyes:  EOMI, conjunctiva normal bilaterally HENT: Normocephalic, atraumatic,mucus membranes moist Respiratory: Normal inspiratory effort Cardiovascular: Normal rate GI: nondistended skin: No rash, skin intact Musculoskeletal: Mild swelling left [name]. No bruising. No redness, no increased temperature.  [name] ROM baseline for PT, Flexion  intact,  Patella NT, Patellar apprehension test negative, Patellar tendon NT, Medial joint NT, Lateral joint NT, Popliteal region NT, Lachman's stable, Varus LCL  stress testing stable, Valgus MCL stress testing stable, McMurray's testing normal, distal NVI with grossly intact baseline sensation / motor distal to [name]. small effusion. No crepitus to range of motion.  Neurologic: Alert & oriented x 3, no focal neuro deficits Psychiatric: Speech and behavior appropriate   ED Course   Medications - No data to display  Orders Placed This Encounter  Procedures  . DG [name] Complete 4 Views Left    Standing Status:   Standing    Number of  Occurrences:   1    Order Specific Question:   Reason for Exam (SYMPTOM  OR DIAGNOSIS REQUIRED)    Answer:   L [name] pain 3-4 mo worse today r/o fx effusion  . Apply ace wrap    Standing Status:   Standing    Number of Occurrences:   1    No results found for this or any previous visit (from the past 24 hour(s)). Dg [name] Complete 4 Views Left  Result Date: 04/04/2016 CLINICAL DATA:  Left [name] pain for a month.  No trauma. EXAM: LEFT [name] - COMPLETE 4+ VIEW COMPARISON:  None. FINDINGS: Vascular calcifications are identified. No fractures or bony lesions. Mild patellofemoral compartment degenerative changes. Suspected small suprapatellar effusion. IMPRESSION: No fracture or dislocation. Small suprapatellar effusion. Mild degenerative changes. Electronically Signed   By: Dorise Bullion III M.D   On: 04/04/2016 14:55    ED Clinical Impression  Chronic pain of left [name]   ED Assessment/Plan  Previous records reviewed. As noted in history of present illness.  No evidence of gout, septic joint. Suspect osteoarthritis. We'll get the x-ray to rule out any acute changes since this has not been done yet.   Reviewed imaging independently. No fracture or dislocation. Small suprapatellar effusion. Mild degenerative changes. See radiology report for details.  Patient has a small effusion on films. Mild degenerative changes, otherwise x-ray is unremarkable. May be a meniscal injury versus wear and tear.  does not appear to  be a ligamentous injury at this time. Plan to send home with Tylenol, cane which he states he has at home, ice, providing Ace wrap here. Continue diclofenac gel. We'll refer him to Dr. Marlou Sa, orthopedic surgeon on call for reevaluation if no better in a week to 10 days.  Discussed  imaging, MDM, plan and followup with patient. Discussed sn/sx that should prompt return to the ED. Patient and family agrees with plan.   No orders of the defined types were placed in this encounter.   *This clinic note was created using Dragon dictation software. Therefore, there may be occasional mistakes despite careful proofreading.  ?   Melynda Ripple, MD 04/04/16 [phone]

## 2016-04-04 NOTE — Discharge Instructions (Signed)
wearthe Ace wrap as needed for comfort. Ice to knee for 20 minutes at a time. Use a cane to help take pressure off the her knee when you walk Start some Tylenol in addition to the Voltaren gel. This is an effective combination for pain. Follow-up with Dr. Marlou Sa in one week if not better. Go to the ER for fever above 100.4, if your knee becomes very hot, red, swollen, if you're unable to move the knee, or other concerns

## 2016-04-04 NOTE — ED Triage Notes (Signed)
L  Knee  Pain  X  3-4    Months   Worse   Since  Last    Week    Pain    Worse   This  Am    denys   Any  specefic   Injury    Seen     pcp 7    Days  Ago  And  Was  rx  A  Cream

## 2016-05-07 ENCOUNTER — Encounter: Payer: Self-pay | Admitting: Internal Medicine

## 2016-05-07 ENCOUNTER — Ambulatory Visit (INDEPENDENT_AMBULATORY_CARE_PROVIDER_SITE_OTHER): Payer: Medicare Other | Admitting: Internal Medicine

## 2016-05-07 VITALS — BP 130/60 | HR 65 | Temp 97.6°F | Resp 17 | Ht 66.0 in | Wt 155.0 lb

## 2016-05-07 DIAGNOSIS — M25562 Pain in left knee: Secondary | ICD-10-CM | POA: Diagnosis not present

## 2016-05-07 NOTE — Progress Notes (Signed)
Patient received education resource, including the self-management goal and tool. Patient verbalized understanding. 

## 2016-05-07 NOTE — Progress Notes (Signed)
   Subjective:    Patient ID: Leonard [name], male    DOB: 1920/10/24, 82 y.o.   MRN: [id number]  HPI The patient is a 82 YO man coming coming in for left knee pain. It is going off and on for several months. Several times he has had it to give out on him. He is not using the voltaren gel we prescribed last time. He has also been to urgent care since last time and they did an x-ray (reviewed at visit, mild degenerative changes). He is looking for our opinion on if he should do some shots for his knee. He has not taken any pain medicine for the knee. His big concern is stability.   Review of Systems  Constitutional: Positive for activity change. Negative for appetite change, chills, fatigue, fever and unexpected weight change.  Respiratory: Negative.   Cardiovascular: Negative.   Gastrointestinal: Negative.   Musculoskeletal: Positive for arthralgias and gait problem. Negative for joint swelling, myalgias and neck pain.  Neurological: Negative for dizziness, weakness, light-headedness and headaches.      Objective:   Physical Exam  Constitutional: He appears well-developed and well-nourished.  HENT:  Head: Normocephalic and atraumatic.  Eyes: EOM are normal.  Cardiovascular: Normal rate and regular rhythm.   Pulmonary/Chest: Effort normal.  Abdominal: Soft.  Musculoskeletal:  ACL and PCL intact left knee, no effusion or pain to palpation  Skin: Skin is warm and dry.   Vitals:   05/07/16 1022  BP: 130/60  Pulse: 65  Resp: 17  Temp: 97.6 F (36.4 C)  TempSrc: Oral  SpO2: 98%  Weight: 155 lb (70.3 kg)  Height: 5\' 6"  (1.676 m)      Assessment & Plan:

## 2016-05-07 NOTE — Patient Instructions (Signed)
We will have you see Dr. Tamala Julian

## 2016-05-07 NOTE — Assessment & Plan Note (Signed)
Refer to sports medicine. Pain is not his biggest concern but stability. May need brace for stability. Not taking meds for his pain and has not tried the voltaren gel.

## 2016-05-24 NOTE — Progress Notes (Signed)
[name] [address], [place] [address] Phone: [phone] Subjective:    I'm seeing this patient by the request  of:  Hoyt Koch, MD   CC: Left knee pain  [id number]  Leonard [name] is a 82 y.o. male coming in with complaint of left knee pain. Has been intermittent for multiple months. States that there has been increasing instability where it is giving out on a multiple times. Patient has been given topical anti-inflammatories with very minimal improvement. Had worsening pain and went to the urgent care where x-rays are done. X-rays were independently visualized by me showing moderate degenerative changes. Patient also had some osteopenia. Tries not to take any significant medications. More concerned of the instability more then pain. Patient has been walking with a cane. Has noticed weakness since his most recent stroke.     Past Medical History:  Diagnosis Date  . Bladder tumor    hematuria  . Carotid stenosis, bilateral per doppler 08-20-09   mild  . Coronary artery disease ?date last stress test   hx cabg and redo-- last visit cardiologist dr Olevia Perches 6 yrs ago, sees pcp dr Gwyndolyn Saxon hopper  . DDD (degenerative disc disease), lumbar    s/p fusion 4-5  . Diabetes mellitus    oral med and insuin  . Diabetic peripheral neuropathy (HCC)    feet  . Glaucoma bilateral    eye drop rx  . History of hiatal hernia    denies reflux  . HOH (hard of hearing) no hearing aids  . Hypertension   . Nocturia   . Prostate cancer [facility]) 1996   s/p external radiation  . Renal insufficiency hx  . Syncope 06/06/2011   In context of dehydration and bradycardia.   Past Surgical History:  Procedure Laterality Date  . CARDIAC CATHETERIZATION  last cath 2005   per dr Olevia Perches- w/ chart (grafts patent)-  results w/ chart  . CATARACT EXTRACTION W/ INTRAOCULAR LENS  IMPLANT, BILATERAL    . CORONARY ARTERY BYPASS GRAFT  1984   4 vessels  .  CORONARY ARTERY BYPASS GRAFT  1997- redo   3 vessels  . LIPOMA EXCISION  1970's   from back  . LUMBAR FUSION  01/2004   L 4-5  . TRANSURETHRAL RESECTION OF BLADDER TUMOR  04/16/2011   Procedure: TRANSURETHRAL RESECTION OF BLADDER TUMOR (TURBT);  Surgeon: Claybon Jabs;  Location: [place];  Service: Urology;  Laterality: N/A;  . UPPER GASTROINTESTINAL ENDOSCOPY     dx hiatal hernia   Social History   Social History  . Marital status: Married    Spouse name: N/A  . Number of children: N/A  . Years of education: N/A   Occupational History  . retired    Social History Main Topics  . Smoking status: Never Smoker  . Smokeless tobacco: Never Used  . Alcohol use No  . Drug use: No  . Sexual activity: Yes    Birth control/ protection: None   Other Topics Concern  . None   Social History Narrative  . None   Allergies  Allergen Reactions  . Codeine Other (See Comments)    hallucinations  . Microzide [Hydrochlorothiazide]     Acute gout on LosartanHCT 12/2014  . Ace Inhibitors Other (See Comments)     cough   Family History  Problem Relation Age of Onset  . Diabetes Sister   . Cancer Neg Hx   .  Heart disease Neg Hx   . Stroke Neg Hx     Past medical history, social, surgical and family history all reviewed in electronic medical record.  No pertanent information unless stated regarding to the chief complaint.   Review of Systems:Review of systems updated and as accurate as of 05/25/16  No headache, visual changes, nausea, vomiting, diarrhea, constipation, dizziness, abdominal pain, skin rash, fevers, chills, night sweats,  swollen lymph nodes,  chest pain, shortness of breath, mood changes.   Objective  Blood pressure 140/70, pulse 70, height 5\' 6"  (1.676 m), weight 149 lb 3.2 oz (67.7 kg). Systems examined below as of 05/25/16   General: No apparent distress alert and oriented x3 mood and affect normal, dressed appropriately.  HEENT: Pupils equal,  extraocular movements intact  Respiratory: Patient's speak in full sentences and does not appear short of breath  Cardiovascular: No lower extremity edema, non tender, no erythema  Skin: Warm dry intact with no signs of infection or rash on extremities or on axial skeleton.  Abdomen: Soft nontender  Neuro: Cranial nerves II through XII are intact, neurovascularly intact in all extremities with 2+ DTRs and 2+ pulses.  Lymph: No lymphadenopathy of posterior or anterior cervical chain or axillae bilaterally.  Gait antalgic gait MSK:  Non tender with full range of motion and good stability and symmetric strength and tone of shoulders, elbows, wrist, hip, and ankles bilaterally. Patient does have mild weakness of the left lower extremity and mild weakness of the left upper extremity Knee: Left Varus deformity of the knee Tender over the lateral joint line ROM full in flexion and extension and lower leg rotation. Significant instability noted with varus and valgus force. Mild painful patellar compression. Patellar glide with mild crepitus. Patellar and quadriceps tendons unremarkable. Hamstring and quadriceps strength is normal.  Contralateral knee has some mild arthritic changes but nontender.     Impression and Recommendations:     This case required medical decision making of moderate complexity.      Note: This dictation was prepared with Dragon dictation along with smaller phrase technology. Any transcriptional errors that result from this process are unintentional.

## 2016-05-25 ENCOUNTER — Encounter: Payer: Self-pay | Admitting: Family Medicine

## 2016-05-25 ENCOUNTER — Ambulatory Visit (INDEPENDENT_AMBULATORY_CARE_PROVIDER_SITE_OTHER): Payer: Medicare Other | Admitting: Family Medicine

## 2016-05-25 DIAGNOSIS — M1712 Unilateral primary osteoarthritis, left knee: Secondary | ICD-10-CM | POA: Diagnosis not present

## 2016-05-25 NOTE — Patient Instructions (Addendum)
Good to see you.  Ice 20 minutes 2 times daily. Usually after activity and before bed. pennsaid pinkie amount topically 2 times daily as needed.  They will call you on the brace  I would continue with the cane when out of the house.  Vitamin D 2000 Iu Daily  Call me if you need the injection sooner otherwise see me again in 3-4 weeks Happy holidays! Happy New Year!

## 2016-05-25 NOTE — Assessment & Plan Note (Signed)
Patient does have left knee degenerative changes with instability. Patient is having significant discomfort. Patient did have a effusion noted but patient declined any type of intervention such as injection or aspiration today. Patient will try compression sleeve, vitamin D supplementation, topical anti-inflammatories. Encourage patient to continue to stay active. Patient will continue to be active and follow-up with me again in 3-4 weeks for further evaluation. At that time if worsening symptoms consider injection.

## 2016-05-26 ENCOUNTER — Other Ambulatory Visit: Payer: Self-pay | Admitting: Internal Medicine

## 2016-06-29 ENCOUNTER — Encounter: Payer: Self-pay | Admitting: Family Medicine

## 2016-06-29 ENCOUNTER — Ambulatory Visit (INDEPENDENT_AMBULATORY_CARE_PROVIDER_SITE_OTHER): Payer: Medicare Other | Admitting: Family Medicine

## 2016-06-29 DIAGNOSIS — M1712 Unilateral primary osteoarthritis, left knee: Secondary | ICD-10-CM

## 2016-06-29 DIAGNOSIS — R29898 Other symptoms and signs involving the musculoskeletal system: Secondary | ICD-10-CM

## 2016-06-29 MED ORDER — CARVEDILOL 3.125 MG PO TABS: 3.1250 mg | ORAL_TABLET | Freq: Two times a day (BID) | ORAL | 3 refills | Status: DC

## 2016-06-29 MED ORDER — AMLODIPINE BESYLATE 10 MG PO TABS: 5.0000 mg | ORAL_TABLET | Freq: Every day | ORAL | 1 refills | Status: DC

## 2016-06-29 NOTE — Assessment & Plan Note (Signed)
Patient does have more of a left leg weakness. Discussed with patient at great length. Because patient beats of clonus this is more of an upper motor neuron problem. Patient has had a history of a complicated stroke. Patient though did not have weakness at last exam. Seems to be new. Patient also has had a history of lumbar radiculopathy that didn't need surgery. We discussed about potential further workup including MRI and we'll further evaluation and the MRI. Patient as well as caregiver declining this. At this time I do think that we should maximize treatment for potential stroke and we did start him on a beta blocker. Patient will decrease his amlodipine at this time. Low dose of both. Follow-up with me or primary care doctor in the next week. Patient knows if worsening symptoms that he needs to seek medical attention immediately.

## 2016-06-29 NOTE — Progress Notes (Signed)
[name] [address] [place], [place] [address] Phone: [phone] Subjective:    I'm seeing this patient by the request  of:  Hoyt Koch, MD   CC: Left knee pain f/u  [id number]  Leonard [name] is a 83 y.o. male coming in with complaint of left knee pain. Patient was seen previously and did have degenerative changes of the knee. Patient has been doing the brace as well as some mild increase in activity. States that the knee was feeling better. Patient though unfortunately approximately 24 hours ago started having severe pain in the left leg. Seems to be from the near distally to the foot. Had pain with weightbearing or when he was on the bike. Patient states that it was also having weakness where he was unable to lift the leg. Patient states that even had to crawl to go get his walker so he would not bear weight. Patient seems significantly better today but continues to have weakness.     Past Medical History:  Diagnosis Date  . Bladder tumor    hematuria  . Carotid stenosis, bilateral per doppler 08-20-09   mild  . Coronary artery disease ?date last stress test   hx cabg and redo-- last visit cardiologist dr Olevia Perches 6 yrs ago, sees pcp dr Gwyndolyn Saxon hopper  . DDD (degenerative disc disease), lumbar    s/p fusion 4-5  . Diabetes mellitus    oral med and insuin  . Diabetic peripheral neuropathy (HCC)    feet  . Glaucoma bilateral    eye drop rx  . History of hiatal hernia    denies reflux  . HOH (hard of hearing) no hearing aids  . Hypertension   . Nocturia   . Prostate cancer [facility]) 1996   s/p external radiation  . Renal insufficiency hx  . Syncope 06/06/2011   In context of dehydration and bradycardia.   Past Surgical History:  Procedure Laterality Date  . CARDIAC CATHETERIZATION  last cath 2005   per dr Olevia Perches- w/ chart (grafts patent)-  results w/ chart  . CATARACT EXTRACTION W/ INTRAOCULAR LENS  IMPLANT, BILATERAL    .  CORONARY ARTERY BYPASS GRAFT  1984   4 vessels  . CORONARY ARTERY BYPASS GRAFT  1997- redo   3 vessels  . LIPOMA EXCISION  1970's   from back  . LUMBAR FUSION  01/2004   L 4-5  . TRANSURETHRAL RESECTION OF BLADDER TUMOR  04/16/2011   Procedure: TRANSURETHRAL RESECTION OF BLADDER TUMOR (TURBT);  Surgeon: Claybon Jabs;  Location: [place];  Service: Urology;  Laterality: N/A;  . UPPER GASTROINTESTINAL ENDOSCOPY     dx hiatal hernia   Social History   Social History  . Marital status: Married    Spouse name: N/A  . Number of children: N/A  . Years of education: N/A   Occupational History  . retired    Social History Main Topics  . Smoking status: Never Smoker  . Smokeless tobacco: Never Used  . Alcohol use No  . Drug use: No  . Sexual activity: Yes    Birth control/ protection: None   Other Topics Concern  . None   Social History Narrative  . None   Allergies  Allergen Reactions  . Codeine Other (See Comments)    hallucinations  . Microzide [Hydrochlorothiazide]     Acute gout on LosartanHCT 12/2014  . Ace Inhibitors Other (See Comments)  cough   Family History  Problem Relation Age of Onset  . Diabetes Sister   . Cancer Neg Hx   . Heart disease Neg Hx   . Stroke Neg Hx     Past medical history, social, surgical and family history all reviewed in electronic medical record.  No pertanent information unless stated regarding to the chief complaint.   Review of Systems: No nausea, vomiting, diarrhea, constipation, dizziness, abdominal pain, skin rash, fevers, chills, night sweats, weight loss, swollen lymph nodes,  chest pain, shortness of breath, mood changes.    Objective  Blood pressure 140/68, pulse 69, height 5\' 6"  (1.676 m), weight 154 lb (69.9 kg), SpO2 98 %. Systems examined below as of 06/29/16   General: No apparent distress alert and oriented x3 mood and affect normal, dressed appropriately.  HEENT: Pupils equal, extraocular  movements intact  Respiratory: Patient's speak in full sentences and does not appear short of breath  Cardiovascular: No lower extremity edema, non tender, no erythema  Skin: Warm dry intact with no signs of infection or rash on extremities or on axial skeleton.  Abdomen: Soft nontender  Neuro: Cranial nerves II through XII are intact, Patient does have poor beats of clonus of the left lower extremity. Weakness of the left lower leg. Deep tendon reflexes seem to be 2+ and symmetric. Lymph: No lymphadenopathy of posterior or anterior cervical chain or axillae bilaterally.  Gait antalgic gait MSK:  Non tender with full range of motion and good stability and symmetric strength and tone of shoulders, elbows, wrist, hip, and ankles bilaterally. Patient does have mild weakness of the left lower extremity and mild weakness of the left upper extremity Knee: Left Varus deformity of the knee Less tenderness on palpation today. ROM full in flexion and extension and lower leg rotation. Significant instability noted with varus and valgus force. Mild painful patellar compression. Patellar glide with moderate crepitus. Patellar and quadriceps tendons unremarkable. Hamstring and quadriceps strength is normal.  Contralateral knee has some mild arthritic changes but nontender. Patient though does have weakness of dorsiflexion of the foot left side with 3-4 beats of clonus noted. This is new from previous exam.      Impression and Recommendations:     This case required medical decision making of moderate complexity.      Note: This dictation was prepared with Dragon dictation along with smaller phrase technology. Any transcriptional errors that result from this process are unintentional.

## 2016-06-29 NOTE — Assessment & Plan Note (Signed)
Degenerative knee noted. Patient is in custom brace. If any significant worsening now when consider injection and aspiration. Patient wants to avoid that. Patient's blood sugars were out of control at 400 this morning. Patient would not be a candidate for a steroid injection anyhow today. Patient will continue to monitor.

## 2016-06-29 NOTE — Patient Instructions (Addendum)
Good to see you  I am concern with the weakness of the leg and the neurologic changes you are or have had a stroke.  You are on the right medicines but we will make the changes to maximize care.  Ice is your friend. Amlodipine 5 mg daily instead of 10 mg.  Coreg 3.125mg  twice daily  Me or Dr. Sharlet Salina NEED to see you again this week or next week at the latest.  Worsening symptoms or weakness, trouble with speaking, please go to the emergency room.

## 2016-07-05 NOTE — Progress Notes (Signed)
[name] [address], [place] [address] Phone: [phone] Subjective:    I'm seeing this patient by the request  of:  Hoyt Koch, MD   CC: Left knee pain f/u  [id number]  Leonard [name] is a 83 y.o. male coming in with complaint of left knee pain. Patient is severe arthritic changes of the knee. he was found to have weakness of the lower extremity. Concern that this was either a lumbar radiculopathy or potential stroke with patient having clonus. Patient was optimized with his medications. States that he has noticed some improvement with cognitive function. Patient has not had any side effects to the medication. Continues to have some pain of the knee itself but otherwise states that it seems to be walking a little bit better.     Past Medical History:  Diagnosis Date  . Bladder tumor    hematuria  . Carotid stenosis, bilateral per doppler 08-20-09   mild  . Coronary artery disease ?date last stress test   hx cabg and redo-- last visit cardiologist dr Olevia Perches 6 yrs ago, sees pcp dr Gwyndolyn Saxon hopper  . DDD (degenerative disc disease), lumbar    s/p fusion 4-5  . Diabetes mellitus    oral med and insuin  . Diabetic peripheral neuropathy (HCC)    feet  . Glaucoma bilateral    eye drop rx  . History of hiatal hernia    denies reflux  . HOH (hard of hearing) no hearing aids  . Hypertension   . Nocturia   . Prostate cancer [facility]) 1996   s/p external radiation  . Renal insufficiency hx  . Syncope 06/06/2011   In context of dehydration and bradycardia.   Past Surgical History:  Procedure Laterality Date  . CARDIAC CATHETERIZATION  last cath 2005   per dr Olevia Perches- w/ chart (grafts patent)-  results w/ chart  . CATARACT EXTRACTION W/ INTRAOCULAR LENS  IMPLANT, BILATERAL    . CORONARY ARTERY BYPASS GRAFT  1984   4 vessels  . CORONARY ARTERY BYPASS GRAFT  1997- redo   3 vessels  . LIPOMA EXCISION  1970's   from back  .  LUMBAR FUSION  01/2004   L 4-5  . TRANSURETHRAL RESECTION OF BLADDER TUMOR  04/16/2011   Procedure: TRANSURETHRAL RESECTION OF BLADDER TUMOR (TURBT);  Surgeon: Claybon Jabs;  Location: [place];  Service: Urology;  Laterality: N/A;  . UPPER GASTROINTESTINAL ENDOSCOPY     dx hiatal hernia   Social History   Social History  . Marital status: Married    Spouse name: N/A  . Number of children: N/A  . Years of education: N/A   Occupational History  . retired    Social History Main Topics  . Smoking status: Never Smoker  . Smokeless tobacco: Never Used  . Alcohol use No  . Drug use: No  . Sexual activity: Yes    Birth control/ protection: None   Other Topics Concern  . None   Social History Narrative  . None   Allergies  Allergen Reactions  . Codeine Other (See Comments)    hallucinations  . Microzide [Hydrochlorothiazide]     Acute gout on LosartanHCT 12/2014  . Ace Inhibitors Other (See Comments)     cough   Family History  Problem Relation Age of Onset  . Diabetes Sister   . Cancer Neg Hx   . Heart disease Neg Hx   . Stroke  Neg Hx     Past medical history, social, surgical and family history all reviewed in electronic medical record.  No pertanent information unless stated regarding to the chief complaint.   Review of Systems: No headache, visual changes, nausea, vomiting, diarrhea, constipation, dizziness, abdominal pain, skin rash, fevers, chills, night sweats, weight loss, swollen lymph nodes, body aches, joint swelling, muscle aches, chest pain, shortness of breath, mood changes.     Objective  Blood pressure (!) 142/80, pulse 60, weight 151 lb 12.8 oz (68.9 kg). Systems examined below as of 07/06/16   General: No apparent distress alert and oriented x3 mood and affect normal, dressed appropriately. frail HEENT: Pupils equal, extraocular movements intact  Respiratory: Patient's speak in full sentences and does not appear short of breath    Cardiovascular: No lower extremity edema, non tender, no erythema  Skin: Warm dry intact with no signs of infection or rash on extremities or on axial skeleton.  Abdomen: Soft nontender  Neuro: Cranial nerves II through XII are intact, Patient does have poor beats of clonus of the left lower extremity. Distal noted but patient does have improvement in the deep tendon reflexes. Lymph: No lymphadenopathy of posterior or anterior cervical chain or axillae bilaterally.  Gait antalgic gait still noted.  MSK:  Non tender with full range of motion and good stability and symmetric strength and tone of shoulders, elbows, wrist, hip, and ankles bilaterally. Patient does have mild weakness of the left lower extremity and mild weakness of the left upper extremity Knee: Left Varus deformity of the knee Minimal tenderness.. ROM full in flexion and extension and lower leg rotation. Significant instability noted with varus and valgus force. Mild painful patellar compression. Patellar glide with moderate crepitus. Patellar and quadriceps tendons unremarkable. Hamstring and quadriceps strength is normal.  Contralateral knee has some mild arthritic changes but nontender. No clonus noted today which is an improvement. Has weakness with dorsiflexion      Impression and Recommendations:     This case required medical decision making of moderate complexity.      Note: This dictation was prepared with Dragon dictation along with smaller phrase technology. Any transcriptional errors that result from this process are unintentional.

## 2016-07-06 ENCOUNTER — Encounter: Payer: Self-pay | Admitting: Family Medicine

## 2016-07-06 ENCOUNTER — Ambulatory Visit (INDEPENDENT_AMBULATORY_CARE_PROVIDER_SITE_OTHER): Payer: Medicare Other | Admitting: Family Medicine

## 2016-07-06 DIAGNOSIS — R29898 Other symptoms and signs involving the musculoskeletal system: Secondary | ICD-10-CM | POA: Diagnosis not present

## 2016-07-06 DIAGNOSIS — M1712 Unilateral primary osteoarthritis, left knee: Secondary | ICD-10-CM

## 2016-07-06 NOTE — Patient Instructions (Signed)
Good to see you  I am glad we are doing better.  Lets not rock the boat and continue the same medicines.  See me again when you need me.

## 2016-07-06 NOTE — Assessment & Plan Note (Signed)
Patient does have more of a degenerative knee arthritis. Still does not want any intervention. Discuss with him potential injection could be beneficial if necessary. Patient will follow-up more on an as-needed basis as long as patient does well.

## 2016-07-06 NOTE — Assessment & Plan Note (Signed)
Mild improvement. Still has some weakness. We discussed possibly changing of the medication but with patient in difficult with some balance and would consider not at this moment. Low back pain with sciatica still within the differential as well. We'll continue to monitor. Patient is to follow-up with me again in 2-3 weeks. No significant changes at this time.

## 2016-08-07 ENCOUNTER — Other Ambulatory Visit: Payer: Self-pay | Admitting: Internal Medicine

## 2016-11-17 ENCOUNTER — Other Ambulatory Visit: Payer: Self-pay | Admitting: Internal Medicine

## 2016-11-19 ENCOUNTER — Other Ambulatory Visit: Payer: Self-pay | Admitting: *Deleted

## 2016-11-19 MED ORDER — CARVEDILOL 3.125 MG PO TABS: 3.1250 mg | ORAL_TABLET | Freq: Two times a day (BID) | ORAL | 0 refills | Status: DC

## 2016-11-19 NOTE — Telephone Encounter (Signed)
Refill done.  

## 2016-12-15 ENCOUNTER — Other Ambulatory Visit: Payer: Self-pay | Admitting: Internal Medicine

## 2016-12-29 ENCOUNTER — Other Ambulatory Visit: Payer: Self-pay | Admitting: Internal Medicine

## 2017-01-05 ENCOUNTER — Encounter: Payer: Self-pay | Admitting: Family Medicine

## 2017-01-05 ENCOUNTER — Ambulatory Visit (INDEPENDENT_AMBULATORY_CARE_PROVIDER_SITE_OTHER): Payer: Medicare Other | Admitting: Family Medicine

## 2017-01-05 ENCOUNTER — Ambulatory Visit: Payer: Self-pay | Admitting: Family Medicine

## 2017-01-05 VITALS — BP 110/60 | HR 58 | Temp 97.9°F | Ht 66.0 in | Wt 148.0 lb

## 2017-01-05 DIAGNOSIS — I10 Essential (primary) hypertension: Secondary | ICD-10-CM | POA: Diagnosis not present

## 2017-01-05 DIAGNOSIS — Z111 Encounter for screening for respiratory tuberculosis: Secondary | ICD-10-CM | POA: Diagnosis not present

## 2017-01-05 DIAGNOSIS — E1122 Type 2 diabetes mellitus with diabetic chronic kidney disease: Secondary | ICD-10-CM

## 2017-01-05 DIAGNOSIS — Z794 Long term (current) use of insulin: Secondary | ICD-10-CM

## 2017-01-05 DIAGNOSIS — E1165 Type 2 diabetes mellitus with hyperglycemia: Secondary | ICD-10-CM | POA: Diagnosis not present

## 2017-01-05 NOTE — Progress Notes (Signed)
Leonard [name] - 83 y.o. male MRN [id number]  Date of birth: 02-28-1921  SUBJECTIVE:  Including CC & ROS.  No chief complaint on file.   Leonard [name] is a 83 year old male that is presenting to have paperwork completed. He is going to be transitioning to living in assisted living facility at [place]. He has a history of diabetes and hypertension. He is apparently spends a reports he has had several occasions hypoglycemia. His endocrinologist, Dr. Wilson Singer, has recently retired so he will need to follow-up with new endocrinologist or establish with a new primary care doctor. The goal is to maintain his blood sugar above 200 but below 300. He has been out of losartan. He denies any chest pain or shortness of breath. He reports measuring his blood pressure on a regular basis. His wife will be in the dementia unit at [facility].     Review of Systems  Constitutional: Negative for fever.  Respiratory: Negative for shortness of breath.   Cardiovascular: Negative for chest pain.  Gastrointestinal: Negative for constipation, diarrhea, nausea and vomiting.  Musculoskeletal: Positive for arthralgias, gait problem and joint swelling.  Skin: Negative for rash.  Neurological: Negative for seizures and weakness.  Psychiatric/Behavioral: Negative for agitation.   otherwise negative  HISTORY: Past Medical, Surgical, Social, and Family History Reviewed & Updated per EMR.   Pertinent Historical Findings include:  Past Medical History:  Diagnosis Date  . Bladder tumor    hematuria  . Carotid stenosis, bilateral per doppler 08-20-09   mild  . Coronary artery disease ?date last stress test   hx cabg and redo-- last visit cardiologist dr Olevia Perches 6 yrs ago, sees pcp dr Gwyndolyn Saxon hopper  . DDD (degenerative disc disease), lumbar    s/p fusion 4-5  . Diabetes mellitus    oral med and insuin  . Diabetic peripheral neuropathy (HCC)    feet  . Glaucoma bilateral    eye drop rx  . History of hiatal hernia    denies reflux  . HOH (hard of hearing) no hearing aids  . Hypertension   . Nocturia   . Prostate cancer [facility]) 1996   s/p external radiation  . Renal insufficiency hx  . Syncope 06/06/2011   In context of dehydration and bradycardia.    Past Surgical History:  Procedure Laterality Date  . CARDIAC CATHETERIZATION  last cath 2005   per dr Olevia Perches- w/ chart (grafts patent)-  results w/ chart  . CATARACT EXTRACTION W/ INTRAOCULAR LENS  IMPLANT, BILATERAL    . CORONARY ARTERY BYPASS GRAFT  1984   4 vessels  . CORONARY ARTERY BYPASS GRAFT  1997- redo   3 vessels  . LIPOMA EXCISION  1970's   from back  . LUMBAR FUSION  01/2004   L 4-5  . TRANSURETHRAL RESECTION OF BLADDER TUMOR  04/16/2011   Procedure: TRANSURETHRAL RESECTION OF BLADDER TUMOR (TURBT);  Surgeon: Claybon Jabs;  Location: [employer];  Service: Urology;  Laterality: N/A;  . UPPER GASTROINTESTINAL ENDOSCOPY     dx hiatal hernia    Allergies  Allergen Reactions  . Codeine Other (See Comments)    hallucinations  . Microzide [Hydrochlorothiazide]     Acute gout on LosartanHCT 12/2014  . Ace Inhibitors Other (See Comments)     cough    Family History  Problem Relation Age of Onset  . Diabetes Sister   . Cancer Neg Hx   . Heart disease Neg Hx   . Stroke Neg Hx  Social History   Social History  . Marital status: Married    Spouse name: N/A  . Number of children: N/A  . Years of education: N/A   Occupational History  . retired    Social History Main Topics  . Smoking status: Never Smoker  . Smokeless tobacco: Never Used  . Alcohol use No  . Drug use: No  . Sexual activity: Yes    Birth control/ protection: None   Other Topics Concern  . Not on file   Social History Narrative  . No narrative on file     PHYSICAL EXAM:  VS: BP 110/60 (BP Location: Left Arm, Patient Position: Sitting, Cuff Size: Normal)   Pulse (!) 58   Temp 97.9 F (36.6 C) (Oral)   Ht 5\' 6"  (1.676 m)   Wt  148 lb (67.1 kg)   SpO2 99%   BMI 23.89 kg/m  Physical Exam  Constitutional: He is oriented to person, place, and time. He appears well-developed and well-nourished.  HENT:  Head: Normocephalic and atraumatic.  Right Ear: External ear normal.  Left Ear: External ear normal.  Eyes: Conjunctivae and EOM are normal.  Neck: Normal range of motion. Neck supple.  Cardiovascular: Regular rhythm and normal heart sounds.   Pulmonary/Chest: Effort normal and breath sounds normal.  Abdominal: Soft. Bowel sounds are normal.  Musculoskeletal: Normal range of motion. He exhibits no edema.  Neurological: He is alert and oriented to person, place, and time.  Skin: Skin is warm and dry.  Psychiatric: He has a normal mood and affect. His behavior is normal.     ASSESSMENT & PLAN:   I spent 25 minutes with this patient, greater than 50% was face-to-face time counseling regarding the below diagnosis.   Uncontrolled type 2 diabetes with renal manifestation Uncontrolled. Dr. Wilson Singer has recently retired. Will try to obtain his records in regards to his sliding scale. Reviewed lab work today.  - Decreased insulin to 8 U BID  - continue current short acting and Januvia  - advised to follow up with PCP soon and obtain labwork.  - Filled out his FL2 for [place] today   Essential hypertension Currently Controlled. Has not been taking Losartan  - continue amlodipine 15 mg  - taking coreg  - will hold losartan for now. Will obtain different measurements to see if it will needed to be added back

## 2017-01-05 NOTE — Assessment & Plan Note (Signed)
Currently Controlled. Has not been taking Losartan  - continue amlodipine 15 mg  - taking coreg  - will hold losartan for now. Will obtain different measurements to see if it will needed to be added back

## 2017-01-05 NOTE — Assessment & Plan Note (Addendum)
Uncontrolled. Dr. Wilson Singer has recently retired. Will try to obtain his records in regards to his sliding scale. Reviewed lab work today.  - Decreased insulin to 8 U BID  - continue current short acting and Januvia  - advised to follow up with PCP soon and obtain labwork.  - Filled out his FL2 for [place] today

## 2017-01-07 ENCOUNTER — Encounter: Payer: Self-pay | Admitting: General Practice

## 2017-01-07 LAB — TB SKIN TEST
Induration: 0 mm
TB Skin Test: NEGATIVE

## 2017-02-02 DIAGNOSIS — Z0279 Encounter for issue of other medical certificate: Secondary | ICD-10-CM

## 2017-02-04 ENCOUNTER — Other Ambulatory Visit: Payer: Self-pay | Admitting: Family Medicine

## 2017-02-04 MED ORDER — DICLOFENAC SODIUM 1 % TD GEL: 2.0000 g | Freq: Two times a day (BID) | TRANSDERMAL | 3 refills | Status: AC | PRN

## 2017-02-04 MED ORDER — INSULIN PEN NEEDLE 32G X 4 MM MISC: 1 refills | Status: DC

## 2017-02-04 MED ORDER — INSULIN LISPRO 100 UNIT/ML ~~LOC~~ SOLN: SUBCUTANEOUS | 3 refills | Status: DC

## 2017-02-04 NOTE — Progress Notes (Signed)
Will send a sensitive sliding scale insulin.   BG  <150 None  150-199 1U  200-249 2U 250-299 3U 300-349 4U >350 5U  Rosemarie Ax, MD [facility] 02/04/2017, 4:32 PM

## 2017-03-04 ENCOUNTER — Other Ambulatory Visit: Payer: Self-pay | Admitting: Family Medicine

## 2017-03-11 ENCOUNTER — Encounter (HOSPITAL_COMMUNITY): Payer: Self-pay

## 2017-03-11 ENCOUNTER — Observation Stay (HOSPITAL_COMMUNITY)
Admission: EM | Admit: 2017-03-11 | Discharge: 2017-03-13 | Disposition: A | Discharge status: Home / Self Care | Payer: Medicare Other | Attending: Family Medicine | Admitting: Family Medicine

## 2017-03-11 ENCOUNTER — Emergency Department (HOSPITAL_COMMUNITY): Payer: Medicare Other

## 2017-03-11 ENCOUNTER — Inpatient Hospital Stay (HOSPITAL_COMMUNITY): Admit: 2017-03-11 | Payer: Medicare Other

## 2017-03-11 DIAGNOSIS — Z7982 Long term (current) use of aspirin: Secondary | ICD-10-CM | POA: Diagnosis not present

## 2017-03-11 DIAGNOSIS — I130 Hypertensive heart and chronic kidney disease with heart failure and stage 1 through stage 4 chronic kidney disease, or unspecified chronic kidney disease: Secondary | ICD-10-CM | POA: Insufficient documentation

## 2017-03-11 DIAGNOSIS — E1165 Type 2 diabetes mellitus with hyperglycemia: Secondary | ICD-10-CM | POA: Diagnosis not present

## 2017-03-11 DIAGNOSIS — N2889 Other specified disorders of kidney and ureter: Secondary | ICD-10-CM | POA: Diagnosis present

## 2017-03-11 DIAGNOSIS — I200 Unstable angina: Secondary | ICD-10-CM

## 2017-03-11 DIAGNOSIS — R079 Chest pain, unspecified: Secondary | ICD-10-CM | POA: Diagnosis not present

## 2017-03-11 DIAGNOSIS — E1129 Type 2 diabetes mellitus with other diabetic kidney complication: Secondary | ICD-10-CM | POA: Diagnosis present

## 2017-03-11 DIAGNOSIS — E785 Hyperlipidemia, unspecified: Secondary | ICD-10-CM | POA: Diagnosis present

## 2017-03-11 DIAGNOSIS — D649 Anemia, unspecified: Secondary | ICD-10-CM

## 2017-03-11 DIAGNOSIS — Z8546 Personal history of malignant neoplasm of prostate: Secondary | ICD-10-CM | POA: Insufficient documentation

## 2017-03-11 DIAGNOSIS — Z794 Long term (current) use of insulin: Secondary | ICD-10-CM | POA: Diagnosis not present

## 2017-03-11 DIAGNOSIS — K449 Diaphragmatic hernia without obstruction or gangrene: Secondary | ICD-10-CM | POA: Insufficient documentation

## 2017-03-11 DIAGNOSIS — D696 Thrombocytopenia, unspecified: Secondary | ICD-10-CM | POA: Diagnosis present

## 2017-03-11 DIAGNOSIS — N183 Chronic kidney disease, stage 3 unspecified: Secondary | ICD-10-CM | POA: Diagnosis present

## 2017-03-11 DIAGNOSIS — Z951 Presence of aortocoronary bypass graft: Secondary | ICD-10-CM | POA: Insufficient documentation

## 2017-03-11 DIAGNOSIS — R9439 Abnormal result of other cardiovascular function study: Secondary | ICD-10-CM | POA: Diagnosis not present

## 2017-03-11 DIAGNOSIS — H409 Unspecified glaucoma: Secondary | ICD-10-CM | POA: Insufficient documentation

## 2017-03-11 DIAGNOSIS — E1122 Type 2 diabetes mellitus with diabetic chronic kidney disease: Secondary | ICD-10-CM | POA: Diagnosis not present

## 2017-03-11 DIAGNOSIS — M1712 Unilateral primary osteoarthritis, left knee: Secondary | ICD-10-CM | POA: Diagnosis not present

## 2017-03-11 DIAGNOSIS — H401190 Primary open-angle glaucoma, unspecified eye, stage unspecified: Secondary | ICD-10-CM | POA: Insufficient documentation

## 2017-03-11 DIAGNOSIS — I10 Essential (primary) hypertension: Secondary | ICD-10-CM | POA: Diagnosis not present

## 2017-03-11 DIAGNOSIS — R2231 Localized swelling, mass and lump, right upper limb: Secondary | ICD-10-CM

## 2017-03-11 DIAGNOSIS — I5042 Chronic combined systolic (congestive) and diastolic (congestive) heart failure: Secondary | ICD-10-CM | POA: Insufficient documentation

## 2017-03-11 DIAGNOSIS — I2510 Atherosclerotic heart disease of native coronary artery without angina pectoris: Secondary | ICD-10-CM | POA: Diagnosis present

## 2017-03-11 DIAGNOSIS — IMO0002 Reserved for concepts with insufficient information to code with codable children: Secondary | ICD-10-CM | POA: Diagnosis present

## 2017-03-11 HISTORY — DX: Localized swelling, mass and lump, right upper limb: R22.31

## 2017-03-11 HISTORY — DX: Other ill-defined heart diseases: I51.89

## 2017-03-11 HISTORY — DX: Thrombocytopenia, unspecified: D69.6

## 2017-03-11 LAB — CBC
HCT: 37.3 % — ABNORMAL LOW (ref 39.0–52.0)
Hemoglobin: 12.3 g/dL — ABNORMAL LOW (ref 13.0–17.0)
MCH: 28 pg (ref 26.0–34.0)
MCHC: 33 g/dL (ref 30.0–36.0)
MCV: 85 fL (ref 78.0–100.0)
PLATELETS: 127 10*3/uL — AB (ref 150–400)
RBC: 4.39 MIL/uL (ref 4.22–5.81)
RDW: 14.4 % (ref 11.5–15.5)
WBC: 5 10*3/uL (ref 4.0–10.5)

## 2017-03-11 LAB — I-STAT TROPONIN, ED
TROPONIN I, POC: 0 ng/mL (ref 0.00–0.08)
Troponin i, poc: 0.02 ng/mL (ref 0.00–0.08)

## 2017-03-11 LAB — BASIC METABOLIC PANEL
Anion gap: 7 (ref 5–15)
BUN: 18 mg/dL (ref 6–20)
CO2: 23 mmol/L (ref 22–32)
CREATININE: 1.24 mg/dL (ref 0.61–1.24)
Calcium: 8.3 mg/dL — ABNORMAL LOW (ref 8.9–10.3)
Chloride: 105 mmol/L (ref 101–111)
GFR calc Af Amer: 56 mL/min — ABNORMAL LOW (ref >60)
GFR calc non Af Amer: 48 mL/min — ABNORMAL LOW (ref >60)
Glucose, Bld: 213 mg/dL — ABNORMAL HIGH (ref 65–99)
POTASSIUM: 3.6 mmol/L (ref 3.5–5.1)
SODIUM: 135 mmol/L (ref 135–145)

## 2017-03-11 LAB — LIPID PANEL
CHOLESTEROL: 136 mg/dL (ref 0–200)
HDL: 46 mg/dL (ref >40)
LDL Cholesterol: 73 mg/dL (ref 0–99)
Total CHOL/HDL Ratio: 3 RATIO
Triglycerides: 84 mg/dL (ref <150)
VLDL: 17 mg/dL (ref 0–40)

## 2017-03-11 LAB — HEMOGLOBIN A1C
Hgb A1c MFr Bld: 8.8 % — ABNORMAL HIGH (ref 4.8–5.6)
MEAN PLASMA GLUCOSE: 205.86 mg/dL

## 2017-03-11 LAB — TROPONIN I

## 2017-03-11 LAB — GLUCOSE, CAPILLARY
GLUCOSE-CAPILLARY: 236 mg/dL — AB (ref 65–99)
Glucose-Capillary: 258 mg/dL — ABNORMAL HIGH (ref 65–99)

## 2017-03-11 MED ORDER — ATORVASTATIN CALCIUM 40 MG PO TABS
40.0000 mg | ORAL_TABLET | Freq: Every day | ORAL | Status: DC
  Administered 2017-03-11 – 2017-03-12 (×2): 40 mg via ORAL
  Filled 2017-03-11 (×2): qty 1

## 2017-03-11 MED ORDER — NITROGLYCERIN 0.4 MG SL SUBL
0.4000 mg | SUBLINGUAL_TABLET | SUBLINGUAL | Status: AC | PRN
  Administered 2017-03-11 (×2): 0.4 mg via SUBLINGUAL
  Filled 2017-03-11: qty 1

## 2017-03-11 MED ORDER — CARVEDILOL 3.125 MG PO TABS
3.1250 mg | ORAL_TABLET | Freq: Two times a day (BID) | ORAL | Status: AC
  Administered 2017-03-11: 3.125 mg via ORAL
  Filled 2017-03-11: qty 1

## 2017-03-11 MED ORDER — SODIUM CHLORIDE 0.9 % IV SOLN
INTRAVENOUS | Status: DC
  Administered 2017-03-11: 21:00:00 via INTRAVENOUS

## 2017-03-11 MED ORDER — GABAPENTIN 600 MG PO TABS
600.0000 mg | ORAL_TABLET | Freq: Three times a day (TID) | ORAL | Status: DC
  Administered 2017-03-11 – 2017-03-13 (×4): 600 mg via ORAL
  Filled 2017-03-11 (×5): qty 1

## 2017-03-11 MED ORDER — ACETAMINOPHEN 325 MG PO TABS: 650.0000 mg | ORAL_TABLET | ORAL | Status: DC | PRN

## 2017-03-11 MED ORDER — AMLODIPINE BESYLATE 5 MG PO TABS
5.0000 mg | ORAL_TABLET | Freq: Every day | ORAL | Status: DC
  Filled 2017-03-11: qty 1

## 2017-03-11 MED ORDER — HEPARIN SODIUM (PORCINE) 5000 UNIT/ML IJ SOLN
5000.0000 [IU] | Freq: Three times a day (TID) | INTRAMUSCULAR | Status: DC
  Administered 2017-03-11 – 2017-03-13 (×5): 5000 [IU] via SUBCUTANEOUS
  Filled 2017-03-11 (×5): qty 1

## 2017-03-11 MED ORDER — INSULIN ASPART 100 UNIT/ML ~~LOC~~ SOLN
0.0000 [IU] | Freq: Three times a day (TID) | SUBCUTANEOUS | Status: DC
  Administered 2017-03-12: 5 [IU] via SUBCUTANEOUS
  Administered 2017-03-13: 2 [IU] via SUBCUTANEOUS

## 2017-03-11 MED ORDER — LATANOPROST 0.005 % OP SOLN
1.0000 [drp] | Freq: Every day | OPHTHALMIC | Status: DC
  Administered 2017-03-11 – 2017-03-12 (×2): 1 [drp] via OPHTHALMIC
  Filled 2017-03-11: qty 2.5

## 2017-03-11 MED ORDER — CARVEDILOL 3.125 MG PO TABS: 3.1250 mg | ORAL_TABLET | Freq: Two times a day (BID) | ORAL | Status: DC

## 2017-03-11 MED ORDER — AMLODIPINE BESYLATE 5 MG PO TABS: 5.0000 mg | ORAL_TABLET | Freq: Every day | ORAL | Status: DC

## 2017-03-11 MED ORDER — GI COCKTAIL ~~LOC~~
30.0000 mL | Freq: Once | ORAL | Status: AC
  Administered 2017-03-11: 30 mL via ORAL
  Filled 2017-03-11: qty 30

## 2017-03-11 MED ORDER — GI COCKTAIL ~~LOC~~: 30.0000 mL | Freq: Four times a day (QID) | ORAL | Status: DC | PRN

## 2017-03-11 MED ORDER — ONDANSETRON HCL 4 MG/2ML IJ SOLN: 4.0000 mg | Freq: Four times a day (QID) | INTRAMUSCULAR | Status: DC | PRN

## 2017-03-11 MED ORDER — ALPRAZOLAM 0.25 MG PO TABS: 0.2500 mg | ORAL_TABLET | Freq: Two times a day (BID) | ORAL | Status: DC | PRN

## 2017-03-11 MED ORDER — SODIUM CHLORIDE 0.9 % IV SOLN: INTRAVENOUS | Status: DC

## 2017-03-11 MED ORDER — ASPIRIN EC 81 MG PO TBEC
81.0000 mg | DELAYED_RELEASE_TABLET | Freq: Every day | ORAL | Status: DC
  Administered 2017-03-12 – 2017-03-13 (×2): 81 mg via ORAL
  Filled 2017-03-11 (×2): qty 1

## 2017-03-11 MED ORDER — TAMSULOSIN HCL 0.4 MG PO CAPS
0.4000 mg | ORAL_CAPSULE | Freq: Every day | ORAL | Status: DC
  Administered 2017-03-12 – 2017-03-13 (×2): 0.4 mg via ORAL
  Filled 2017-03-11 (×2): qty 1

## 2017-03-11 MED ORDER — INSULIN GLARGINE 100 UNIT/ML ~~LOC~~ SOLN: 8.0000 [IU] | Freq: Two times a day (BID) | SUBCUTANEOUS | Status: DC

## 2017-03-11 MED ORDER — MIRABEGRON ER 25 MG PO TB24
25.0000 mg | ORAL_TABLET | Freq: Every day | ORAL | Status: DC
  Administered 2017-03-12 – 2017-03-13 (×2): 25 mg via ORAL
  Filled 2017-03-11 (×2): qty 1

## 2017-03-11 MED ORDER — DICLOFENAC SODIUM 1 % TD GEL
2.0000 g | Freq: Four times a day (QID) | TRANSDERMAL | Status: DC
  Filled 2017-03-11: qty 100

## 2017-03-11 NOTE — H&P (Signed)
History and Physical    YEHYA BRENDLE KVQ:[id number] DOB: 1921/03/04 DOA: 03/11/2017   PCP: Hoyt Koch, MD   Patient coming from:  Home    Chief Complaint: Chest pain   HPI: Leonard [name] is a 83 y.o. male with extensive medical history listed below, including CAD status post CABG, history of diastolic heart failure, EF 55% 2012, diabetes, with peripheral neuropathy, hiatal hernia, hypertension, history of prostate cancer status post radiation, hyperlipidemia, history of CVA CA, CK D stage III, brought from his resident facility at [facility] accompanied by his son in law, a Neuroanesthesiologist, after complaining of chest pain after finished walking and he was sitting in his chair.  He denies  any chest pain at this time. Nurses at the facility reported to patient to have diaphoresis. He was brought to the ED for further evaluation. On presentation, the patient was essentially asymptomatic except for a "nagging, persistent "discomfort on the left side of his chest. Pain notworsened with deep inspiration Patient took ASA 324 mg on arrival, and then given NTG with some relief.  Denies any dizziness or falls. No syncope or presyncope. Denies shortness of breath or cough  Denies any fever or chills. Denies any nausea, vomiting or abdominal pain at the time of evaluation. Appetite is normal andeats salt rich foods. He reports chronic left lower extremity swelling, denies calf pain  Denies any headaches or vision changes. Denies any seizures No confusion reported. The patient has not seen a cardiologist since 2013 No recent long distance trips. Denies any new stressors. No new meds. Not on hormonal therapy.  No new herbal supplements. Does not smoke   ED Course:  BP (!) 159/75   Pulse (!) 59   Temp 97.6 F (36.4 C) (Oral)   Resp 20   Ht 5\' 7"  (1.702 m)   Wt 68.5 kg (151 lb)   SpO2 99%   BMI 23.65 kg/m    EKG Sinus bradycardia with 1st degree A-V block with Premature  supraventricular complexes Left ventricular hypertrophy with QRS widening Cannot rule out Anterior infarct , age undetermined T wave abnormality  No significant change since last tracing Last 2 D echo 2012 EF 55 akinesis mid inferoseptal myocardium abd Gr 1 DD  Labs were unremarkable for glucose 213, creatinine 1.24. Troponin continues to be negative White count 5 Hemoglobin 12.3 platelets 127 Chest x-ray negative for acute disease  Review of Systems:  As per HPI otherwise all other systems reviewed and are negative  Past Medical History:  Diagnosis Date  . Bladder tumor    hematuria  . Carotid stenosis, bilateral per doppler 08-20-09   mild  . Coronary artery disease ?date last stress test   hx cabg and redo-- last visit cardiologist dr Olevia Perches 6 yrs ago, sees pcp dr Gwyndolyn Saxon hopper  . DDD (degenerative disc disease), lumbar    s/p fusion 4-5  . Diabetes mellitus    oral med and insuin  . Diabetic peripheral neuropathy (HCC)    feet  . Glaucoma bilateral    eye drop rx  . History of hiatal hernia    denies reflux  . HOH (hard of hearing) no hearing aids  . Hypertension   . Nocturia   . Prostate cancer [facility]) 1996   s/p external radiation  . Renal insufficiency hx  . Syncope 06/06/2011   In context of dehydration and bradycardia.    Past Surgical History:  Procedure Laterality Date  . CARDIAC CATHETERIZATION  last cath  2005   per dr Olevia Perches- w/ chart (grafts patent)-  results w/ chart  . CATARACT EXTRACTION W/ INTRAOCULAR LENS  IMPLANT, BILATERAL    . CORONARY ARTERY BYPASS GRAFT  1984   4 vessels  . CORONARY ARTERY BYPASS GRAFT  1997- redo   3 vessels  . LIPOMA EXCISION  1970's   from back  . LUMBAR FUSION  01/2004   L 4-5  . TRANSURETHRAL RESECTION OF BLADDER TUMOR  04/16/2011   Procedure: TRANSURETHRAL RESECTION OF BLADDER TUMOR (TURBT);  Surgeon: Claybon Jabs;  Location: [place];  Service: Urology;  Laterality: N/A;  . UPPER GASTROINTESTINAL  ENDOSCOPY     dx hiatal hernia    Social History Social History   Social History  . Marital status: Married    Spouse name: N/A  . Number of children: N/A  . Years of education: N/A   Occupational History  . retired    Social History Main Topics  . Smoking status: Never Smoker  . Smokeless tobacco: Never Used  . Alcohol use No  . Drug use: No  . Sexual activity: Yes    Birth control/ protection: None   Other Topics Concern  . Not on file   Social History Narrative  . No narrative on file     Allergies  Allergen Reactions  . Codeine Other (See Comments)    hallucinations  . Microzide [Hydrochlorothiazide]     Acute gout on LosartanHCT 12/2014  . Ace Inhibitors Other (See Comments)     cough    Family History  Problem Relation Age of Onset  . Diabetes Sister   . Cancer Neg Hx   . Heart disease Neg Hx   . Stroke Neg Hx       Prior to Admission medications   Medication Sig Start Date End Date Taking? Authorizing Provider  amLODipine (NORVASC) 10 MG tablet Take 0.5 tablets (5 mg total) by mouth daily. 06/29/16   Lyndal Pulley, DO  amLODipine (NORVASC) 10 MG tablet TAKE 1 TABLET (10 MG TOTAL) BY MOUTH DAILY. 08/09/16   Lyndal Pulley, DO  aspirin 81 MG tablet Take 81 mg by mouth daily.     [provider]  atorvastatin (LIPITOR) 40 MG tablet Take 40 mg by mouth daily.    [provider]  bimatoprost (LUMIGAN) 0.03 % ophthalmic drops Place 1 drop into both eyes at bedtime.     [provider]  carvedilol (COREG) 3.125 MG tablet TAKE 1 TABLET (3.125 MG TOTAL) BY MOUTH 2 (TWO) TIMES DAILY WITH A MEAL. 03/08/17   Hoyt Koch, MD  diclofenac sodium (VOLTAREN) 1 % GEL Apply 2 g topically 2 (two) times daily as needed. 02/04/17   Rosemarie Ax, MD  fish oil-omega-3 fatty acids 1000 MG capsule Take 1 g by mouth 2 (two) times daily.     [provider]  gabapentin (NEURONTIN) 600 MG tablet Take 600 mg by mouth 3 (three)  times daily.      [provider]  insulin glargine (LANTUS) 100 UNIT/ML injection Inject 8 Units into the skin 2 (two) times daily.    [provider]  insulin lispro (HUMALOG) 100 UNIT/ML injection sliding scale. Blood Glucose  <150 None  150-199 1U  200-249 2U 250-299 3U 300-349 4U >350 5U 02/04/17   Rosemarie Ax, MD  Insulin Pen Needle 32G X 4 MM MISC As directed per insulin dose and sliding scale 02/04/17   Clearance Coots  E, MD  losartan (COZAAR) 100 MG tablet Take 1 tablet (100 mg total) by mouth daily. Overdue for annual appt w/labs must see MD for refills 11/17/16   Hoyt Koch, MD  mirabegron ER (MYRBETRIQ) 25 MG TB24 tablet Take 25 mg by mouth daily.     [provider]  nitroGLYCERIN (NITROSTAT) 0.4 MG SL tablet Place 0.4 mg under the tongue every 5 (five) minutes as needed. For chest pain. Patient out of Rx  05/11/11   Hendricks Limes, MD  s[facility]GL[facility]) 100 MG tablet Take 50 mg by mouth daily.     [provider]  Tamsulosin HCl (FLOMAX) 0.4 MG CAPS Take 0.4 mg by mouth daily.    [provider]    Physical Exam:  Vitals:   03/11/17 1330 03/11/17 1345 03/11/17 1400 03/11/17 1500  BP: (!) 143/61 (!) 162/69 130/68 (!) 159/75  Pulse: (!) 56 (!) 56 (!) 56 (!) 59  Resp: 17 18 17 20   Temp:      TempSrc:      SpO2: 99% 97% 98% 99%  Weight:      Height:       Constitutional: NAD, calm, comfortable, looks younger than stated age   Eyes: PERRL, lids and conjunctivae normal ENMT: Mucous membranes are moist, without exudate or lesions  Neck: normal, supple, no masses, no thyromegaly Respiratory: clear to auscultation bilaterally, no wheezing, no crackles. Normal respiratory effort  Cardiovascular: Regular rate and rhythm, 1/6  murmur, rubs or gallops. Mild left lower extremity edema . 2+ pedal pulses. No carotid bruits.  Abdomen: Soft, non tender, No hepatosplenomegaly. Bowel sounds positive.  Musculoskeletal: no  clubbing / cyanosis. Moves all extremities. Cannot reproduce chest pain. Skin: no jaundice, No lesions.  Neurologic: Sensation intact  Strength equal in all extremities Psychiatric:   Alert and oriented x 3. Normal mood.     Labs on Admission: I have personally reviewed following labs and imaging studies  CBC:  Recent Labs Lab 03/11/17 1259  WBC 5.0  HGB 12.3*  HCT 37.3*  MCV 85.0  PLT 127*    Basic Metabolic Panel:  Recent Labs Lab 03/11/17 1259  NA 135  K 3.6  CL 105  CO2 23  GLUCOSE 213*  BUN 18  CREATININE 1.24  CALCIUM 8.3*    GFR: Estimated Creatinine Clearance: 34.1 mL/min (by C-G formula based on SCr of 1.24 mg/dL).  Liver Function Tests: No results for input(s): AST, ALT, ALKPHOS, BILITOT, PROT, ALBUMIN in the last 168 hours. No results for input(s): LIPASE, AMYLASE in the last 168 hours. No results for input(s): AMMONIA in the last 168 hours.  Coagulation Profile: No results for input(s): INR, PROTIME in the last 168 hours.  Cardiac Enzymes: No results for input(s): CKTOTAL, CKMB, CKMBINDEX, TROPONINI in the last 168 hours.  BNP (last 3 results) No results for input(s): PROBNP in the last 8760 hours.  HbA1C: No results for input(s): HGBA1C in the last 72 hours.  CBG: No results for input(s): GLUCAP in the last 168 hours.  Lipid Profile: No results for input(s): CHOL, HDL, LDLCALC, TRIG, CHOLHDL, LDLDIRECT in the last 72 hours.  Thyroid Function Tests: No results for input(s): TSH, T4TOTAL, FREET4, T3FREE, THYROIDAB in the last 72 hours.  Anemia Panel: No results for input(s): VITAMINB12, FOLATE, FERRITIN, TIBC, IRON, RETICCTPCT in the last 72 hours.  Urine analysis:    Component Value Date/Time   COLORURINE YELLOW 07/10/2014 Evan 07/10/2014 0039   LABSPEC  1.011 07/10/2014 0039   PHURINE 5.5 07/10/2014 0039   GLUCOSEU NEGATIVE 07/10/2014 0039   HGBUR NEGATIVE 07/10/2014 0039   HGBUR negative 02/28/2008 0751    BILIRUBINUR NEGATIVE 07/10/2014 0039   KETONESUR NEGATIVE 07/10/2014 0039   PROTEINUR NEGATIVE 07/10/2014 0039   UROBILINOGEN 0.2 07/10/2014 0039   NITRITE NEGATIVE 07/10/2014 0039   LEUKOCYTESUR TRACE (A) 07/10/2014 0039    Sepsis Labs: @LABRCNTIP (procalcitonin:4,lacticidven:4) )No results found for this or any previous visit (from the past 240 hour(s)).   Radiological Exams on Admission: Dg Chest 2 View  Result Date: 03/11/2017 CLINICAL DATA:  Left-sided chest pain for several hours EXAM: CHEST  2 VIEW COMPARISON:  06/19/2015 FINDINGS: Cardiac shadow is mildly prominent. Postoperative changes are again seen. The lungs are well aerated bilaterally. No focal infiltrate or sizable effusion is seen. Degenerative change of the thoracic spine is noted. IMPRESSION: No active cardiopulmonary disease. Electronically Signed   By: Inez Catalina M.D.   On: 03/11/2017 13:48    EKG: Independently reviewed.  Assessment/Plan Active Problems:   Uncontrolled type 2 diabetes with renal manifestation (HCC)   Hyperlipidemia   Essential hypertension   Coronary atherosclerosis   Chronic renal insufficiency, stage III (moderate) (HCC)   Degenerative arthritis of left knee   Glaucoma   Hiatal hernia   Chest pain   Chest pain syndrome/known CAD  HEART score 5 Troponin , EKG without evidence of ACS. Last 2 D echo 2012 EF 55 akinesis mid inferoseptal myocardium abd Gr 1 DD CP  Somewhat relieved by nitroglycerin, aspirin. CXR unrevealing    Admit to Telemetry/ Observation Chest pain order set Cycle troponins EKG in am 2 D echo  continue ASA, O2 and NTG as needed GI cocktail in view of history of hiatal hernia  Check Lipid panel  Hb A1C Stress test in am. Will need to be NPO after Midnight, and to hold BB abd Ca Ch  Blockers after midnight.    Hypertension BP   159/75   Pulse  59   Continue home anti-hypertensive medications  Hold mds after midnight as above   Hyperlipidemia Continue home  statins  Type II Diabetes Current blood sugar level is 213 Lab Results  Component Value Date   HGBA1C 7.4 (H) 02/28/2008  Hgb A1C Lantus , SSI  Chronic kidney disease stage    baseline creatinine 1.5  Current Cr 1.24 Lab Results  Component Value Date   CREATININE 1.24 03/11/2017   CREATININE 1.40 (H) 04/21/2015   CREATININE 1.63 (H) 11/28/2014  IVF Repeat CMET in am    Glaucoma Continue drops  History of prostate cancer, no acute issues Continue FLomax, Myrbetriq  Sentoria Brent E, PA-C

## 2017-03-11 NOTE — ED Notes (Signed)
ED Provider at bedside. 

## 2017-03-11 NOTE — ED Notes (Signed)
Pt reports he was upset this morning about breakfast running late and having to wait on people and that what he thinks made his chest hurt.

## 2017-03-11 NOTE — ED Triage Notes (Addendum)
Per ems- at 0700 started having left sided cp, non-radiating, is very specific to certain spot. Pt is coming from [place], [place]. Went to the nurses station c/o cp, 4/10 when sitting, 9/10 when walking. Has hx of CAD, and CABG x 2. Given 324 aspirin, 1 nitro, no relief of pain. EKG SR. His forearms were clammy, and chest was hot to touch. 99.8 temporal. BP 140/70 initially, 120/70. HR 55-65. CBG 236.

## 2017-03-11 NOTE — ED Provider Notes (Signed)
[facility] DEPT Provider Note   CSN: [id number] Arrival date & time: 03/11/17  1211     History   Chief Complaint Chief Complaint  Patient presents with  . Chest Pain    HPI Leonard [name] is a 83 y.o. male with a PMHx of CAD s/p CABG x2, mild dCHF, DM2 (EF 55% in 2012), periph neuropathy, hiatal hernia, HTN, prostate CA s/p radiation, HLD, CVA, CKD3, L knee arthritis, and other conditions listed below, who presents to the ED via EMS from his facility [facility], accompanied by his son in law (who is a Chief Technology Officer, and helps with the history), with complaints of sudden onset chest pain that began around 9:30 AM after he had finished walking and was sitting in his chair in his room. Patient states that he had been upset earlier in the day because he had to wait for breakfast, but after eating breakfast he went on his usual walk, during the walk he had no chest pain or any symptoms, after returning to his room he sat down and short while later when he tried to get up he noticed that he was having some left-sided chest pain. He describes the pain as 7/10 constant pressure-like pain in the left chest, nonradiating, with no known aggravating factors, unchanged with walking/exertion or inspiration, and initially unrelieved with aspirin 324mg  and 1 SL NTG, however later he states that the NTG seems to have helped "ease it up a little". The nursing staff at his facility noticed that his forearms were clammy and diaphoretic, but patient does not recall feeling that way. He has an extensive family history of CAD and all of his siblings (had 7 siblings originally, only he and 1 other remain alive today). Patient's son-in-law states that apparently this is not the first time that he has had chest pain like this recently, but the pt had not told anybody or gotten any medical attention during his prior episodes. The patient has not seen a cardiologist since 2013 when he saw Dr. [name] at [facility] heart  care. He has not had another heart cath since 2005. His PCP is Dr. Sharlet Salina at [facility]. Patient has never smoked in his entire life. He is not on any blood thinners. He reports that he's had some pain in his L knee so he's got a knee sleeve on, and states that he always has some mild swelling in his L leg more than his R leg; this is stable and at baseline.   He denies lightheadedness, fevers, chills, cough, SOB, new/worsening LE swelling, recent travel/surgery/immobilization, personal/family hx of DVT/PE, abd pain, N/V/D/C, hematuria, dysuria, myalgias, arthralgias, claudication, orthopnea, numbness, tingling, focal weakness, or any other complaints at this time.    The history is provided by the patient and medical records. No language interpreter was used.  Chest Pain   This is a new problem. The current episode started 3 to 5 hours ago. The problem occurs constantly. The problem has not changed since onset.The pain is associated with rest. The pain is present in the lateral region. The pain is at a severity of 7/10. The pain is moderate. The quality of the pain is described as pressure-like. The pain does not radiate. Duration of episode(s) is 3 hours. Exacerbated by: nothing. Associated symptoms include diaphoresis. Pertinent negatives include no abdominal pain, no claudication, no cough, no fever, no lower extremity edema (nothing changed from baseline), no nausea, no numbness, no orthopnea, no shortness of breath, no vomiting and no weakness.  He has tried nitroglycerin (and 324mg  ASA) for the symptoms. The treatment provided mild relief. Risk factors include male gender and being elderly.  His past medical history is significant for CAD, diabetes, hyperlipidemia, hypertension and MI.  Pertinent negatives for past medical history include no DVT and no PE.  His family medical history is significant for CAD.  Pertinent negatives for family medical history include: no PE.  Procedure  history is positive for cardiac catheterization and echocardiogram.    Past Medical History:  Diagnosis Date  . Bladder tumor    hematuria  . Carotid stenosis, bilateral per doppler 08-20-09   mild  . Coronary artery disease ?date last stress test   hx cabg and redo-- last visit cardiologist dr Olevia Perches 6 yrs ago, sees pcp dr Gwyndolyn Saxon hopper  . DDD (degenerative disc disease), lumbar    s/p fusion 4-5  . Diabetes mellitus    oral med and insuin  . Diabetic peripheral neuropathy (HCC)    feet  . Glaucoma bilateral    eye drop rx  . History of hiatal hernia    denies reflux  . HOH (hard of hearing) no hearing aids  . Hypertension   . Nocturia   . Prostate cancer [facility]) 1996   s/p external radiation  . Renal insufficiency hx  . Syncope 06/06/2011   In context of dehydration and bradycardia.    Patient Active Problem List   Diagnosis Date Noted  . Left leg weakness 06/29/2016  . Degenerative arthritis of left knee 05/25/2016  . Left knee pain 03/24/2016  . History of prostate cancer 06/12/2014  . Chronic renal insufficiency, stage III (moderate) ([place]) 06/12/2014  . Non-compliant behavior 10/18/2013  . Abnormal computed tomography of pancreas or bile duct 09/21/2011  . Low back pain with sciatica 07/07/2010  . Uncontrolled type 2 diabetes with renal manifestation ([place]) 03/06/2008    Class: Chronic  . Hyperlipidemia 03/06/2008  . Hx of completed stroke 03/06/2008  . Essential hypertension 07/26/2007    Class: Chronic  . CAROTID BRUIT 07/26/2007  . CORONARY ARTERY DISEASE 07/04/2006    Class: Chronic  . OSTEOPOROSIS 07/04/2006    Past Surgical History:  Procedure Laterality Date  . CARDIAC CATHETERIZATION  last cath 2005   per dr Olevia Perches- w/ chart (grafts patent)-  results w/ chart  . CATARACT EXTRACTION W/ INTRAOCULAR LENS  IMPLANT, BILATERAL    . CORONARY ARTERY BYPASS GRAFT  1984   4 vessels  . CORONARY ARTERY BYPASS GRAFT  1997- redo   3 vessels  . LIPOMA  EXCISION  1970's   from back  . LUMBAR FUSION  01/2004   L 4-5  . TRANSURETHRAL RESECTION OF BLADDER TUMOR  04/16/2011   Procedure: TRANSURETHRAL RESECTION OF BLADDER TUMOR (TURBT);  Surgeon: Claybon Jabs;  Location: [facility];  Service: Urology;  Laterality: N/A;  . UPPER GASTROINTESTINAL ENDOSCOPY     dx hiatal hernia       Home Medications    Prior to Admission medications   Medication Sig Start Date End Date Taking? Authorizing Provider  amLODipine (NORVASC) 10 MG tablet Take 0.5 tablets (5 mg total) by mouth daily. 06/29/16   Lyndal Pulley, DO  amLODipine (NORVASC) 10 MG tablet TAKE 1 TABLET (10 MG TOTAL) BY MOUTH DAILY. 08/09/16   Lyndal Pulley, DO  aspirin 81 MG tablet Take 81 mg by mouth daily.     [provider]  atorvastatin (LIPITOR) 40 MG tablet Take 40 mg by  mouth daily.    [provider]  bimatoprost (LUMIGAN) 0.03 % ophthalmic drops Place 1 drop into both eyes at bedtime.     [provider]  carvedilol (COREG) 3.125 MG tablet TAKE 1 TABLET (3.125 MG TOTAL) BY MOUTH 2 (TWO) TIMES DAILY WITH A MEAL. 03/08/17   Hoyt Koch, MD  diclofenac sodium (VOLTAREN) 1 % GEL Apply 2 g topically 2 (two) times daily as needed. 02/04/17   Rosemarie Ax, MD  fish oil-omega-3 fatty acids 1000 MG capsule Take 1 g by mouth 2 (two) times daily.     [provider]  gabapentin (NEURONTIN) 600 MG tablet Take 600 mg by mouth 3 (three) times daily.      [provider]  insulin glargine (LANTUS) 100 UNIT/ML injection Inject 8 Units into the skin 2 (two) times daily.    [provider]  insulin lispro (HUMALOG) 100 UNIT/ML injection sliding scale. Blood Glucose  <150 None  150-199 1U  200-249 2U 250-299 3U 300-349 4U >350 5U 02/04/17   Rosemarie Ax, MD  Insulin Pen Needle 32G X 4 MM MISC As directed per insulin dose and sliding scale 02/04/17   Rosemarie Ax, MD  losartan (COZAAR) 100 MG tablet  Take 1 tablet (100 mg total) by mouth daily. Overdue for annual appt w/labs must see MD for refills 11/17/16   Hoyt Koch, MD  mirabegron ER (MYRBETRIQ) 25 MG TB24 tablet Take 25 mg by mouth daily.     [provider]  nitroGLYCERIN (NITROSTAT) 0.4 MG SL tablet Place 0.4 mg under the tongue every 5 (five) minutes as needed. For chest pain. Patient out of Rx  05/11/11   Hendricks Limes, MD  sitaGLIPtan [facility]) 100 MG tablet Take 50 mg by mouth daily.     [provider]  Tamsulosin HCl (FLOMAX) 0.4 MG CAPS Take 0.4 mg by mouth daily.    [provider]    Family History Family History  Problem Relation Age of Onset  . Diabetes Sister   . Cancer Neg Hx   . Heart disease Neg Hx   . Stroke Neg Hx     Social History Social History  Substance Use Topics  . Smoking status: Never Smoker  . Smokeless tobacco: Never Used  . Alcohol use No     Allergies   Codeine; Microzide [hydrochlorothiazide]; and Ace inhibitors   Review of Systems Review of Systems  Constitutional: Positive for diaphoresis. Negative for chills and fever.  Respiratory: Negative for cough and shortness of breath.   Cardiovascular: Positive for chest pain. Negative for orthopnea, claudication and leg swelling.  Gastrointestinal: Negative for abdominal pain, constipation, diarrhea, nausea and vomiting.  Genitourinary: Negative for dysuria and hematuria.  Musculoskeletal: Negative for arthralgias and myalgias.  Skin: Negative for color change.  Allergic/Immunologic: Positive for immunocompromised state (DM2).  Neurological: Negative for weakness, light-headedness and numbness.  Hematological: Does not bruise/bleed easily.  Psychiatric/Behavioral: Negative for confusion.   All other systems reviewed and are negative for acute change except as noted in the HPI.    Physical Exam Updated Vital Signs BP (!) 143/73 (BP Location: Right Arm)   Pulse (!) 58   Temp 97.6 F (36.4 C)  (Oral)   Resp 11   Ht 5\' 7"  (1.702 m)   Wt 68.5 kg (151 lb)   SpO2 100%   BMI 23.65 kg/m   Physical Exam  Constitutional: He is oriented to person, place, and time. Vital  signs are normal. He appears well-developed and well-nourished.  Non-toxic appearance. No distress.  Afebrile, nontoxic, NAD, looks younger than stated age  HENT:  Head: Normocephalic and atraumatic.  Mouth/Throat: Oropharynx is clear and moist and mucous membranes are normal.  Eyes: Conjunctivae and EOM are normal. Right eye exhibits no discharge. Left eye exhibits no discharge.  Neck: Normal range of motion. Neck supple.  Cardiovascular: Normal rate, regular rhythm, normal heart sounds and intact distal pulses.  Exam reveals no gallop and no friction rub.   No murmur heard. Slight bradycardia in the upper 50s, occasional PVCs on the monitor, but normal rhythm, nl s1/s2, no m/r/g, distal pulses intact, 1+ RLE and 2+ LLE pedal edema which he states is baseline  Pulmonary/Chest: Effort normal and breath sounds normal. No respiratory distress. He has no decreased breath sounds. He has no wheezes. He has no rhonchi. He has no rales. He exhibits no tenderness, no crepitus, no deformity and no retraction.  CTAB in all lung fields, no w/r/r, no hypoxia or increased WOB, speaking in full sentences, SpO2 100% on RA  Chest wall nonTTP without crepitus, deformities, or retractions   Abdominal: Soft. Normal appearance and bowel sounds are normal. He exhibits no distension. There is no tenderness. There is no rigidity, no rebound, no guarding, no CVA tenderness, no tenderness at McBurney's point and negative Murphy's sign.  Musculoskeletal: Normal range of motion.  MAE x4 Strength and sensation grossly intact in all extremities Distal pulses intact 1+ RLE and 2+ LLE pedal edema which he states is chronic and unchanged; edema seems to be in areas where there is something tight around the leg, such as the tight knee sleeve on the L  knee, and the tight sock on the R leg. Neg homan's bilaterally   Neurological: He is alert and oriented to person, place, and time. He has normal strength. No sensory deficit.  Skin: Skin is warm, dry and intact. No rash noted.  Psychiatric: He has a normal mood and affect.  Nursing note and vitals reviewed.    ED Treatments / Results  Labs (all labs ordered are listed, but only abnormal results are displayed) Labs Reviewed  BASIC METABOLIC PANEL - Abnormal; Notable for the following:       Result Value   Glucose, Bld 213 (*)    Calcium 8.3 (*)    GFR calc non Af Amer 48 (*)    GFR calc Af Amer 56 (*)    All other components within normal limits  CBC - Abnormal; Notable for the following:    Hemoglobin 12.3 (*)    HCT 37.3 (*)    Platelets 127 (*)    All other components within normal limits  I-STAT TROPONIN, ED    EKG  EKG Interpretation  Date/Time:  Friday March 11 2017 12:14:02 EDT Ventricular Rate:  59 PR Interval:    QRS Duration: 130 QT Interval:  466 QTC Calculation: 461 R Axis:   -29 Text Interpretation:  Sinus bradycardia with 1st degree A-V block with Premature supraventricular complexes Left ventricular hypertrophy with QRS widening Cannot rule out Anterior infarct , age undetermined T wave abnormality, consider lateral ischemia No significant change since last tracing Confirmed by Blanchie Dessert [phone]) on 03/11/2017 1:52:18 PM       Radiology Dg Chest 2 View  Result Date: 03/11/2017 CLINICAL DATA:  Left-sided chest pain for several hours EXAM: CHEST  2 VIEW COMPARISON:  06/19/2015 FINDINGS: Cardiac shadow is mildly prominent. Postoperative changes are  again seen. The lungs are well aerated bilaterally. No focal infiltrate or sizable effusion is seen. Degenerative change of the thoracic spine is noted. IMPRESSION: No active cardiopulmonary disease. Electronically Signed   By: Inez Catalina M.D.   On: 03/11/2017 13:48     [facility] 2005 (per Dr. Olin Pia Berneice Gandy  [facility]] notes 10/06/11): demonstrating patent LIMA, patent vein graft to the marginal, patent sequential vein graft to the PD and RPLVs with  severe native disease. His ejection fraction at that time was normal.    Echo 06/07/2011: Study Conclusions - Left ventricle: The cavity size was normal. Wall thickness was normal. The estimated ejection fraction was 55%. There is akinesis of the basal-midinferoseptal myocardium. Doppler parameters are consistent with abnormal left ventricular relaxation (grade 1 diastolic dysfunction). - Aortic valve: Trileaflet; mildly thickened, mildly calcified leaflets. - Mitral valve: Calcified annulus. Mild to moderate regurgitation. - Left atrium: The atrium was mildly to moderately dilated. - Tricuspid valve: Mild regurgitation. - Pericardium, extracardiac: There was no pericardial effusion.    Procedures Procedures (including critical care time)  Medications Ordered in ED Medications  nitroGLYCERIN (NITROSTAT) SL tablet 0.4 mg (not administered)  gi cocktail (Maalox,Lidocaine,Donnatal) (30 mLs Oral Given 03/11/17 1416)     Initial Impression / Assessment and Plan / ED Course  I have reviewed the triage vital signs and the nursing notes.  Pertinent labs & imaging results that were available during my care of the patient were reviewed by me and considered in my medical decision making (see chart for details).     83 y.o. male here with CP that began this morning around 9:30am after he had finished walking and he was sitting in his chair; denies pain when he was walking, just after. Reportedly his forearms were diaphoretic. He denies any lightheadedness or SOB. On exam, no chest wall tenderness or abdominal tenderness, clear lung exam, 1+ edema on R leg and 2+ edema on L leg under a tight knee sleeve (pt states this is chronic at baseline), no hypoxia or tachycardia. EKG with mild bradycardia and 1st degree AV block, PVCs, some  T wave inversions but appears similar to prior EKGs. Initial troponin negatie at 1pm (3.5hrs after onset). Awaiting labs and CXR. Given 324mg  ASA and 1 NTG which he initially states didn't help, but then he said it is improving it just slightly. Will give GI cocktail and 2 more NTG to see if these help at all; will hold on morphine for now so to avoid any hypotension with the NTG. Had [place] in 2005 which reportedly had severe native disease with patent veins in prior CABG areas; last Echo 2012 which showed grade 1 diastolic dysfunction. Will reassess shortly, but I anticipate the pt may need admission for ACS r/o. Discussed case with my attending Dr. Maryan Rued who agrees with plan.  2:01 PM CBC with mild anemia, lower than last value in 04/2015 but similar to the value just prior to that in 07/2014; also with chronic stable thrombocytopenia. BMP with hyperglycemia 213 but no anion gap or abnormal bicarb. CXR negative. Will proceed with admission for ACS r/o and unstable angina.   2:18 PM Sharene Butters APP of [facility] returning page and will admit. Holding orders to be placed by admitting team. Please see their notes for further documentation of care. I appreciate their help with this pleasant pt's care. Pt stable at time of admission.    Final Clinical Impressions(s) / ED Diagnoses   Final diagnoses:  Unstable angina ([place])  Left sided chest pain  Anemia, unspecified type  Thrombocytopenia ([place])  Type 2 diabetes mellitus with hyperglycemia, with long-term current use of insulin [facility])    New Prescriptions New Prescriptions   No medications on [address], [place], [place] 03/11/17 [place], MD 03/12/17 2014

## 2017-03-12 ENCOUNTER — Observation Stay (HOSPITAL_COMMUNITY): Payer: Medicare Other

## 2017-03-12 ENCOUNTER — Observation Stay (HOSPITAL_BASED_OUTPATIENT_CLINIC_OR_DEPARTMENT_OTHER): Payer: Medicare Other

## 2017-03-12 ENCOUNTER — Encounter (HOSPITAL_COMMUNITY): Payer: Self-pay | Admitting: Nurse Practitioner

## 2017-03-12 DIAGNOSIS — D696 Thrombocytopenia, unspecified: Secondary | ICD-10-CM

## 2017-03-12 DIAGNOSIS — R079 Chest pain, unspecified: Secondary | ICD-10-CM | POA: Diagnosis not present

## 2017-03-12 DIAGNOSIS — I340 Nonrheumatic mitral (valve) insufficiency: Secondary | ICD-10-CM | POA: Diagnosis not present

## 2017-03-12 DIAGNOSIS — I10 Essential (primary) hypertension: Secondary | ICD-10-CM | POA: Diagnosis not present

## 2017-03-12 DIAGNOSIS — E1129 Type 2 diabetes mellitus with other diabetic kidney complication: Secondary | ICD-10-CM | POA: Diagnosis not present

## 2017-03-12 DIAGNOSIS — I2510 Atherosclerotic heart disease of native coronary artery without angina pectoris: Secondary | ICD-10-CM | POA: Diagnosis not present

## 2017-03-12 DIAGNOSIS — I130 Hypertensive heart and chronic kidney disease with heart failure and stage 1 through stage 4 chronic kidney disease, or unspecified chronic kidney disease: Secondary | ICD-10-CM | POA: Diagnosis not present

## 2017-03-12 DIAGNOSIS — R9439 Abnormal result of other cardiovascular function study: Secondary | ICD-10-CM | POA: Diagnosis not present

## 2017-03-12 DIAGNOSIS — N183 Chronic kidney disease, stage 3 (moderate): Secondary | ICD-10-CM | POA: Diagnosis not present

## 2017-03-12 DIAGNOSIS — R2231 Localized swelling, mass and lump, right upper limb: Secondary | ICD-10-CM

## 2017-03-12 DIAGNOSIS — E1165 Type 2 diabetes mellitus with hyperglycemia: Secondary | ICD-10-CM | POA: Diagnosis not present

## 2017-03-12 HISTORY — DX: Localized swelling, mass and lump, right upper limb: R22.31

## 2017-03-12 HISTORY — DX: Thrombocytopenia, unspecified: D69.6

## 2017-03-12 LAB — ECHOCARDIOGRAM COMPLETE
Height: 67 in
Weight: 2388.8 oz

## 2017-03-12 LAB — TROPONIN I
Troponin I: <0.03 ng/mL (ref <0.03)
Troponin I: <0.03 ng/mL (ref <0.03)

## 2017-03-12 LAB — NM MYOCAR MULTI W/SPECT W/WALL MOTION / EF
CHL CUP RESTING HR STRESS: 58 {beats}/min
CSEPED: 0 min
CSEPEDS: 0 s
CSEPHR: 56 %
CSEPPHR: 71 {beats}/min
Estimated workload: 1 METS
MPHR: 126 {beats}/min

## 2017-03-12 LAB — GLUCOSE, CAPILLARY
Glucose-Capillary: 113 mg/dL — ABNORMAL HIGH (ref 65–99)
Glucose-Capillary: 152 mg/dL — ABNORMAL HIGH (ref 65–99)
Glucose-Capillary: 270 mg/dL — ABNORMAL HIGH (ref 65–99)

## 2017-03-12 MED ORDER — LOSARTAN POTASSIUM 50 MG PO TABS
100.0000 mg | ORAL_TABLET | Freq: Every day | ORAL | Status: DC
  Administered 2017-03-12 – 2017-03-13 (×2): 100 mg via ORAL
  Filled 2017-03-12 (×2): qty 2

## 2017-03-12 MED ORDER — REGADENOSON 0.4 MG/5ML IV SOLN
INTRAVENOUS | Status: AC
  Filled 2017-03-12: qty 5

## 2017-03-12 MED ORDER — LATANOPROST 0.005 % OP SOLN: 1.0000 [drp] | Freq: Every day | OPHTHALMIC | Status: DC

## 2017-03-12 MED ORDER — TECHNETIUM TC 99M TETROFOSMIN IV KIT
10.0000 | PACK | Freq: Once | INTRAVENOUS | Status: AC | PRN
  Administered 2017-03-12: 10 via INTRAVENOUS

## 2017-03-12 MED ORDER — REGADENOSON 0.4 MG/5ML IV SOLN
0.4000 mg | Freq: Once | INTRAVENOUS | Status: AC
  Administered 2017-03-12: 0.4 mg via INTRAVENOUS
  Filled 2017-03-12: qty 5

## 2017-03-12 MED ORDER — ISOSORBIDE DINITRATE 10 MG PO TABS
10.0000 mg | ORAL_TABLET | Freq: Three times a day (TID) | ORAL | Status: DC
  Administered 2017-03-12 – 2017-03-13 (×3): 10 mg via ORAL
  Filled 2017-03-12 (×3): qty 1

## 2017-03-12 MED ORDER — AMLODIPINE BESYLATE 10 MG PO TABS
10.0000 mg | ORAL_TABLET | Freq: Every day | ORAL | Status: DC
  Administered 2017-03-12 – 2017-03-13 (×2): 10 mg via ORAL
  Filled 2017-03-12 (×2): qty 1

## 2017-03-12 MED ORDER — TECHNETIUM TC 99M TETROFOSMIN IV KIT
30.0000 | PACK | Freq: Once | INTRAVENOUS | Status: AC | PRN
  Administered 2017-03-12: 30 via INTRAVENOUS

## 2017-03-12 NOTE — Plan of Care (Signed)
Problem: Tissue Perfusion: Goal: Risk factors for ineffective tissue perfusion will decrease Outcome: Completed/Met Date Met: 03/12/17 Sub Q heparin

## 2017-03-12 NOTE — Progress Notes (Signed)
  Echocardiogram 2D Echocardiogram has been performed.  Merrie Roof F 03/12/2017, 10:06 AM

## 2017-03-12 NOTE — Consult Note (Addendum)
Cardiology Consultation:   Patient ID: Leonard [name]; [id number]; 11-Sep-1920   Admit date: 03/11/2017 Date of Consult: 03/12/2017  Primary Care Provider: Hoyt Koch, MD Primary Cardiologist: Leonard [name] Electrophysiologist:  Leonard [name]   Patient Profile:   Leonard [name] is a 83 y.o. male with a hx of CAD and CABG x 2 who is being seen today for the evaluation of chest pain  at the request of Leonard [name]. .  History of Present Illness:   Leonard [name] 83 y.o. ischemic heart disease CABG with redo in 1997 last cath 2005 with patent LIMA and patent SVG to OM and PDA. Echo in 2012 showed EF 55% with inferior septum. He has done well and not followed closely by cardiology Lives at [facility]. Was sitting outside and stumbled when he got up and had SSCP. IN ER minor nagging left sided pain. Not positional or pleuritic IN ER R/O no acute ECG changes Primary service ordered myovue for this am but nuclear would not do without cardiology consult. Pain free this am For age very functional and active   Past Medical History:  Diagnosis Date  . Bladder tumor    hematuria  . Carotid stenosis, bilateral per doppler 08-20-09   mild  . Coronary artery disease ?date last stress test   hx cabg and redo-- last visit cardiologist Leonard [name] 6 yrs ago, sees pcp Leonard Leonard [name]  . DDD (degenerative disc disease), lumbar    s/p fusion 4-5  . Diabetes mellitus    oral med and insuin  . Diabetic peripheral neuropathy (HCC)    feet  . Glaucoma bilateral    eye drop rx  . History of hiatal hernia    denies reflux  . HOH (hard of hearing) no hearing aids  . Hypertension   . Nocturia   . Prostate cancer [facility]) 1996   s/p external radiation  . Renal insufficiency hx  . Syncope 06/06/2011   In context of dehydration and bradycardia.    Past Surgical History:  Procedure Laterality Date  . CARDIAC CATHETERIZATION  last cath 2005   per Leonard [name]- w/ chart (grafts patent)-  results w/ chart  .  CATARACT EXTRACTION W/ INTRAOCULAR LENS  IMPLANT, BILATERAL    . CORONARY ARTERY BYPASS GRAFT  1984   4 vessels  . CORONARY ARTERY BYPASS GRAFT  1997- redo   3 vessels  . LIPOMA EXCISION  1970's   from back  . LUMBAR FUSION  01/2004   L 4-5  . TRANSURETHRAL RESECTION OF BLADDER TUMOR  04/16/2011   Procedure: TRANSURETHRAL RESECTION OF BLADDER TUMOR (TURBT);  Surgeon: Leonard [name];  Location: [facility];  Service: Urology;  Laterality: N/A;  . UPPER GASTROINTESTINAL ENDOSCOPY     dx hiatal hernia     Home Medications:  Prior to Admission medications   Medication Sig Start Date End Date Taking? Authorizing Provider  aspirin 81 MG tablet Take 81 mg by mouth daily.    Yes [provider]  atorvastatin (LIPITOR) 40 MG tablet Take 40 mg by mouth daily.   Yes [provider]  carvedilol (COREG) 3.125 MG tablet TAKE 1 TABLET (3.125 MG TOTAL) BY MOUTH 2 (TWO) TIMES DAILY WITH A MEAL. 03/08/17  Yes Leonard Koch, MD  diclofenac sodium (VOLTAREN) 1 % GEL Apply 2 g topically 2 (two) times daily as needed. 02/04/17  Yes Leonard Ax, MD  fish oil-omega-3 fatty acids 1000 MG capsule Take 1 g by  mouth 2 (two) times daily.    Yes [provider]  gabapentin (NEURONTIN) 600 MG tablet Take 600 mg by mouth 3 (three) times daily.     Yes [provider]  insulin glargine (LANTUS) 100 UNIT/ML injection Inject 8 Units into the skin 2 (two) times daily.   Yes [provider]  insulin lispro (HUMALOG) 100 UNIT/ML injection sliding scale. Blood Glucose  <150 None  150-199 1U  200-249 2U 250-299 3U 300-349 4U >350 5U 02/04/17  Yes Leonard Ax, MD  latanoprost (XALATAN) 0.005 % ophthalmic solution Place 1 drop into both eyes at bedtime.   Yes [provider]  losartan (COZAAR) 100 MG tablet Take 1 tablet (100 mg total) by mouth daily. Overdue for annual appt w/labs must see MD for refills 11/17/16  Yes Leonard Koch,  MD  mirabegron ER (MYRBETRIQ) 25 MG TB24 tablet Take 25 mg by mouth daily.    Yes [provider]  sitaGLIPtan (JANUVIA) 100 MG tablet Take 50 mg by mouth daily.    Yes [provider]  Tamsulosin HCl (FLOMAX) 0.4 MG CAPS Take 0.4 mg by mouth daily.   Yes [provider]  Insulin Pen Needle 32G X 4 MM MISC As directed per insulin dose and sliding scale 02/04/17   Leonard Ax, MD    Inpatient Medications: Scheduled Meds: . amLODipine  5 mg Oral Daily  . aspirin EC  81 mg Oral Daily  . atorvastatin  40 mg Oral q1800  . diclofenac sodium  2 g Topical QID  . gabapentin  600 mg Oral TID  . heparin  5,000 Units Subcutaneous Q8H  . insulin aspart  0-9 Units Subcutaneous TID WC  . latanoprost  1 drop Both Eyes QHS  . losartan  100 mg Oral Daily  . mirabegron ER  25 mg Oral Daily  . tamsulosin  0.4 mg Oral Daily   Continuous Infusions:  PRN Meds: acetaminophen, ALPRAZolam, gi cocktail, ondansetron (ZOFRAN) IV  Allergies:    Allergies  Allergen Reactions  . Codeine Nausea And Vomiting and Other (See Comments)    hallucinations  . Microzide [Hydrochlorothiazide] Other (See Comments)    Acute gout on LosartanHCT 12/2014  . Ace Inhibitors Other (See Comments)     cough    Social History:   Social History   Social History  . Marital status: Married    Spouse name: N/A  . Number of children: N/A  . Years of education: N/A   Occupational History  . retired    Social History Main Topics  . Smoking status: Never Smoker  . Smokeless tobacco: Never Used  . Alcohol use No  . Drug use: No  . Sexual activity: Yes    Birth control/ protection: None   Other Topics Concern  . Not on file   Social History Narrative  . No narrative on file    Family History:    Family History  Problem Relation Age of Onset  . Diabetes Sister   . Cancer Neg Hx   . Heart disease Neg Hx   . Stroke Neg Hx      ROS:  Please see the history of present illness.    ROS  All other ROS reviewed and negative.     Physical Exam/Data:   Vitals:   03/11/17 1630 03/11/17 1738 03/11/17 2045 03/12/17 0500  BP: (!) 149/84 116/68 (!) 158/74 (!) 162/64  Pulse: 61  65 (!) 55  Resp: (!) 21  20 18  Temp:  97.8 F (36.6 C) 98.3 F (36.8 C) 98 F (36.7 C)  TempSrc:  Oral Oral Oral  SpO2: 98%  98% 97%  Weight:    149 lb 4.8 oz (67.7 kg)  Height:        Intake/Output Summary (Last 24 hours) at 03/12/17 0859 Last data filed at 03/12/17 0500  Gross per 24 hour  Intake           131.67 ml  Output              450 ml  Net          -318.33 ml   Filed Weights   03/11/17 1223 03/12/17 0500  Weight: 151 lb (68.5 kg) 149 lb 4.8 oz (67.7 kg)   Body mass index is 23.38 kg/m.  General:  Well nourished, well developed, in no acute distress HEENT: normal Lymph: no adenopathy Neck: no JVD Endocrine:  No thryomegaly Vascular: No carotid bruits; FA pulses 2+ bilaterally without bruits  Cardiac:  normal S1, S2; RRR; no murmur  Lungs:  clear to auscultation bilaterally, no wheezing, rhonchi or rales  Abd: soft, nontender, no hepatomegaly  Ext: no edema Musculoskeletal:  No deformities, BUE and BLE strength normal and equal Skin: warm and dry  Neuro:  CNs 2-12 intact, no focal abnormalities noted Psych:  Normal affect   EKG:  SR first degree lateral T wave changes    Telemetry:  SR SB wenkebach no VT  Relevant CV Studies: Echo and myovue pending   Laboratory Data:  Chemistry Recent Labs Lab 03/11/17 1259  NA 135  K 3.6  CL 105  CO2 23  GLUCOSE 213*  BUN 18  CREATININE 1.24  CALCIUM 8.3*  GFRNONAA 48*  GFRAA 56*  ANIONGAP 7    No results for input(s): PROT, ALBUMIN, AST, ALT, ALKPHOS, BILITOT in the last 168 hours. Hematology Recent Labs Lab 03/11/17 1259  WBC 5.0  RBC 4.39  HGB 12.3*  HCT 37.3*  MCV 85.0  MCH 28.0  MCHC 33.0  RDW 14.4  PLT 127*   Cardiac Enzymes Recent Labs Lab 03/11/17 2305 03/12/17 0107 03/12/17 0403   TROPONINI <0.03 <0.03 <0.03    Recent Labs Lab 03/11/17 1258 03/11/17 1631  TROPIPOC 0.02 0.00    BNPNo results for input(s): BNP, PROBNP in the last 168 hours.  DDimer No results for input(s): DDIMER in the last 168 hours.  Radiology/Studies:  Dg Chest 2 View  Result Date: 03/11/2017 CLINICAL DATA:  Left-sided chest pain for several hours EXAM: CHEST  2 VIEW COMPARISON:  06/19/2015 FINDINGS: Cardiac shadow is mildly prominent. Postoperative changes are again seen. The lungs are well aerated bilaterally. No focal infiltrate or sizable effusion is seen. Degenerative change of the thoracic spine is noted. IMPRESSION: No active cardiopulmonary disease. Electronically Signed   By: Inez Catalina M.D.   On: 03/11/2017 13:48    Assessment and Plan:   1. Chest Pain:  Distant CABG x 2 last 1997 r/o better this am have called nuclear to do lexiscan this am. Echo is here as well to do echo and reassess EF. May be able to be d/c if both studies ok 2. HTN:  Well controlled.  Continue current medications and low sodium Dash type diet.    3. DM:  Discussed low carb diet.  Target hemoglobin A1c is 6.5 or less.  Continue current medications. 4. Cholesterol : continue statin  5.   Bradycardia asymptomatic chronic long PR  periods of wenkebach avoid beta blocker Telemetry   For questions or updates, please contact [place] Please consult [email or web address] for contact info under Cardiology/STEMI.   Signed, Jenkins Rouge, MD  03/12/2017 8:59 AM

## 2017-03-12 NOTE — Progress Notes (Signed)
PROGRESS NOTE  Leonard [name] JJH:[id number] DOB: 03-31-1921 DOA: 03/11/2017 PCP: Hoyt Koch, MD  Brief Narrative: 89yom PMH CAD, CABG, DM, presented with chest pain without SOB. Admitted for CP evaluation.  Assessment/Plan Chest pain. -resolved. Troponins negative, EKG nonacute. -continue ASA. Coreg on hold for stress test -f/u echo -nuclear stress was planned, but per NM cardiology must be formally consulted because of age. Discussed with Dr. Nonda Lou proceed with NM stress  CAD, s/p CABG x2 (redo 1997). Per chart had cath 2005 demonstrating patent LIMA, patent veing grafts and severe native disease -continue ASA, statin trho Hyperlipidemia  -LDL 73. continue atorvastatin   Essential HTN -resume losartan  DM type 2 with peripheral neuropathy, uncontrolled by A1c 8.8 -CBG stable. Continue SSI, gabapentin. Hold sitagliptan, Lantus while NPO.  CKD stage III -at baseline  Thrombocytopenia, chronic to at least 2008 -at baseline, no further evaluation suggested given chronicity   3 cm ovoid mobile mass overlying right deltoid, nontender -present 6 months per patient. Lipoma? Recommend outpatient evaluation.   F/u echo and NM stress, if both unremarkable, return to [facility] today  DVT prophylaxis: heparin Code Status: full Family Communication: none Disposition Plan: return to Peter Congo, MD  [facility] Direct contact: [phone] --[place] [place]  --[email or web address]; password [email or web address]  7PM-7AM contact night coverage as above 03/12/2017, 9:19 AM  LOS: 0 days   Consultants:  Cardiology   Procedures:    Antimicrobials:    Interval history/Subjective: Feels better, no CP (but some with ambulation to bathroom). No SOB. No leg pain or swelling.  Objective: Vitals: afebrile, VSS, 98.0, 55, 162/64, 97% on RA  Exam:    Constitutional:  Appears calm and comfortable. Appears 20 years younger than stated age Eyes:  pupils  and irises appear normal Normal lids ENMT:  grossly normal hearing  Lips appear normal  Respiratory:  CTA bilaterally, no w/r/r.  Respiratory effort normal.  Cardiovascular:  RRR, no m/r/g No LE extremity edema   Musculoskeletal:  Digits/nails: cyanosis, petechiae, infection of fingers. Left 3rd, 4th fingers partially surgically absent Skin:  No rashes, lesions, ulcers palpation of skin: 3 cm ovoid mobile mass overlying right deltoid, nontender Psychiatric:  judgement and insight appear normal. Recalls history from yesterday. Appears very sharp mentally Mental status Mood, affect appropriate   I have personally reviewed the following:   Labs:  BMP unremarkable  troponins negative  LDL 73  CBC plts 127, chronic  Imaging studies:  CXR independently reviewed, NAD  Medical tests:  EKG independently reviewed: SB with 1st degree AVB, lateral t wave inversion (old), anterior MI (old), wide QRS/RBBB vs LVH with repol (old)   Review and summation of old records:  2012 Echo: LVEF 02%, grade 1 diastolic dysfucntion, akinesis basal-midinferoseptal myocardium  Scheduled Meds: . amLODipine  5 mg Oral Daily  . aspirin EC  81 mg Oral Daily  . atorvastatin  40 mg Oral q1800  . diclofenac sodium  2 g Topical QID  . gabapentin  600 mg Oral TID  . heparin  5,000 Units Subcutaneous Q8H  . insulin aspart  0-9 Units Subcutaneous TID WC  . latanoprost  1 drop Both Eyes QHS  . losartan  100 mg Oral Daily  . mirabegron ER  25 mg Oral Daily  . tamsulosin  0.4 mg Oral Daily   Continuous Infusions:   Principal Problem:   Chest pain Active Problems:   Uncontrolled type 2 diabetes with renal manifestation (HCC)   Hyperlipidemia  Essential hypertension   Coronary atherosclerosis   Chronic renal insufficiency, stage III (moderate) (HCC)   Thrombocytopenia (HCC)   Mass of skin of right shoulder   LOS: 0 days

## 2017-03-12 NOTE — Progress Notes (Signed)
Myovue shows small area of inferior and lateral wall ischemia Given age and two previous CABG;s would consider trial of medical Rx as he has r/o. Increase norvasc and add nitrates No beta blockers As he has bradycardia with long first degree and periods of wenkebach  [employer]

## 2017-03-13 ENCOUNTER — Observation Stay (HOSPITAL_COMMUNITY): Payer: Medicare Other

## 2017-03-13 DIAGNOSIS — E1129 Type 2 diabetes mellitus with other diabetic kidney complication: Secondary | ICD-10-CM | POA: Diagnosis not present

## 2017-03-13 DIAGNOSIS — D696 Thrombocytopenia, unspecified: Secondary | ICD-10-CM | POA: Diagnosis not present

## 2017-03-13 DIAGNOSIS — E785 Hyperlipidemia, unspecified: Secondary | ICD-10-CM

## 2017-03-13 DIAGNOSIS — R9439 Abnormal result of other cardiovascular function study: Secondary | ICD-10-CM

## 2017-03-13 DIAGNOSIS — R2231 Localized swelling, mass and lump, right upper limb: Secondary | ICD-10-CM | POA: Diagnosis not present

## 2017-03-13 DIAGNOSIS — I10 Essential (primary) hypertension: Secondary | ICD-10-CM | POA: Diagnosis not present

## 2017-03-13 DIAGNOSIS — R079 Chest pain, unspecified: Secondary | ICD-10-CM | POA: Diagnosis not present

## 2017-03-13 DIAGNOSIS — E1165 Type 2 diabetes mellitus with hyperglycemia: Secondary | ICD-10-CM | POA: Diagnosis not present

## 2017-03-13 LAB — GLUCOSE, CAPILLARY
Glucose-Capillary: 166 mg/dL — ABNORMAL HIGH (ref 65–99)
Glucose-Capillary: 185 mg/dL — ABNORMAL HIGH (ref 65–99)

## 2017-03-13 MED ORDER — LORAZEPAM 2 MG/ML IJ SOLN
1.0000 mg | Freq: Once | INTRAMUSCULAR | Status: AC
  Administered 2017-03-13: 1 mg via INTRAVENOUS
  Filled 2017-03-13: qty 1

## 2017-03-13 MED ORDER — ISOSORBIDE DINITRATE 10 MG PO TABS: 10.0000 mg | ORAL_TABLET | Freq: Three times a day (TID) | ORAL | 0 refills | Status: DC

## 2017-03-13 MED ORDER — AMLODIPINE BESYLATE 10 MG PO TABS: 10.0000 mg | ORAL_TABLET | Freq: Every day | ORAL | 0 refills | Status: DC

## 2017-03-13 NOTE — Progress Notes (Signed)
Resting in bed with eyes closed. Cooperative with allowing heart monitor to be reapplied. VSS. Will continue to monitor. Bed alarm set.

## 2017-03-13 NOTE — Progress Notes (Signed)
Progress Note  Patient Name: Leonard [name] Date of Encounter: 03/13/2017  Primary Cardiologist:   Dr. [name]  Subjective   Denies chest pain or SOB.  Inpatient Medications    Scheduled Meds: . amLODipine  10 mg Oral Daily  . aspirin EC  81 mg Oral Daily  . atorvastatin  40 mg Oral q1800  . diclofenac sodium  2 g Topical QID  . gabapentin  600 mg Oral TID  . heparin  5,000 Units Subcutaneous Q8H  . insulin aspart  0-9 Units Subcutaneous TID WC  . isosorbide dinitrate  10 mg Oral TID  . latanoprost  1 drop Both Eyes QHS  . losartan  100 mg Oral Daily  . mirabegron ER  25 mg Oral Daily  . tamsulosin  0.4 mg Oral Daily   Continuous Infusions:  PRN Meds: acetaminophen, ALPRAZolam, gi cocktail, ondansetron (ZOFRAN) IV   Vital Signs    Vitals:   03/12/17 1502 03/12/17 1613 03/13/17 0522 03/13/17 0809  BP: 134/64 110/69 121/66 124/62  Pulse: 61  (!) 52 65  Resp:      Temp:  (!) 97.3 F (36.3 C) 97.9 F (36.6 C)   TempSrc:  Oral Oral   SpO2: 98%  99% 100%  Weight:   150 lb 3.2 oz (68.1 kg)   Height:       No intake or output data in the 24 hours ending 03/13/17 0936 Filed Weights   03/11/17 1223 03/12/17 0500 03/13/17 0522  Weight: 151 lb (68.5 kg) 149 lb 4.8 oz (67.7 kg) 150 lb 3.2 oz (68.1 kg)    Telemetry    NSR, first degree AV block.  One blocked PAC - Personally Reviewed  ECG    NA  - Personally Reviewed  Physical Exam   GEN: No acute distress.   Neck: No  JVD Cardiac: RRR, no murmurs, rubs, or gallops.  Respiratory: Clear  to auscultation bilaterally. GI: Soft, nontender, non-distended  MS: No  edema; No deformity. Neuro:  Nonfocal  Psych: Normal affect   Labs    Chemistry Recent Labs Lab 03/11/17 1259  NA 135  K 3.6  CL 105  CO2 23  GLUCOSE 213*  BUN 18  CREATININE 1.24  CALCIUM 8.3*  GFRNONAA 48*  GFRAA 56*  ANIONGAP 7     Hematology Recent Labs Lab 03/11/17 1259  WBC 5.0  RBC 4.39  HGB 12.3*  HCT 37.3*  MCV 85.0    MCH 28.0  MCHC 33.0  RDW 14.4  PLT 127*    Cardiac Enzymes Recent Labs Lab 03/11/17 2305 03/12/17 0107 03/12/17 0403  TROPONINI <0.03 <0.03 <0.03    Recent Labs Lab 03/11/17 1258 03/11/17 1631  TROPIPOC 0.02 0.00     BNPNo results for input(s): BNP, PROBNP in the last 168 hours.   DDimer No results for input(s): DDIMER in the last 168 hours.   Radiology    Dg Chest 2 View  Result Date: 03/11/2017 CLINICAL DATA:  Left-sided chest pain for several hours EXAM: CHEST  2 VIEW COMPARISON:  06/19/2015 FINDINGS: Cardiac shadow is mildly prominent. Postoperative changes are again seen. The lungs are well aerated bilaterally. No focal infiltrate or sizable effusion is seen. Degenerative change of the thoracic spine is noted. IMPRESSION: No active cardiopulmonary disease. Electronically Signed   By: Inez Catalina M.D.   On: 03/11/2017 13:48   Nm Myocar Multi W/spect W/wall Motion / Ef  Result Date: 03/12/2017  There was no ST segment deviation noted  during stress.  T wave inversion was noted during stress.  Findings consistent with ischemia.  This is an intermediate risk study.  The left ventricular ejection fraction is moderately decreased (30-44%).  Small area of inferior and lateral wall ischemia at mid and basal level SDS 4 EF estimated at 36% with diffuse hypokinesis suggest echo correlation    Cardiac Studies   ECHO  03/12/17 Study Conclusions  - Left ventricle: The cavity size was normal. There was mild   concentric hypertrophy. Systolic function was mildly reduced. The   estimated ejection fraction was in the range of 45% to 50%. Mild   diffuse hypokinesis. Features are consistent with a pseudonormal   left ventricular filling pattern, with concomitant abnormal   relaxation and increased filling pressure (grade 2 diastolic   dysfunction). Doppler parameters are consistent with high   ventricular filling pressure. - Aortic valve: Transvalvular velocity was within  the normal range.   There was no stenosis. There was no regurgitation. - Mitral valve: Severely calcified annulus. Transvalvular velocity   was within the normal range. There was no evidence for stenosis.   There was mild regurgitation. - Left atrium: The atrium was severely dilated. - Right ventricle: The cavity size was normal. Wall thickness was   normal. Systolic function was normal. - Right atrium: The atrium was moderately dilated. - Tricuspid valve: There was mild regurgitation.   Lexiscan Myoview 03/12/17   There was no ST segment deviation noted during stress.  T wave inversion was noted during stress.  Findings consistent with ischemia.  This is an intermediate risk study.  The left ventricular ejection fraction is moderately decreased (30-44%).   Small area of inferior and lateral wall ischemia at mid and basal level SDS 4  EF estimated at 36% with diffuse hypokinesis suggest echo correlation  Patient Profile     83 y.o. male with a hx of CAD and CABG x 2 who is being seen for the evaluation of chest pain at the request of Dr Sarajane Jews.   Assessment & Plan    CHEST PAIN:  Not a high risk study.  Plan medical management.  Now on nitrates and increased Norvasc.  He denies any symptoms.    ISCHEMIC CARDIOMYOPATHY:    EF appears to be lower than previous.  However, without high risk Lexiscan Myoview findings the plan is medical management. Seems to be euvolemic.  HTN;   BP is OK.  No change in therapy   BRADYCARDIA:  No arrhythmias other than one blocked PAC.  No change in therapy.   Signed, Minus Breeding, MD  03/13/2017, 9:36 AM

## 2017-03-13 NOTE — Discharge Summary (Signed)
Physician Discharge Summary  Leonard [name] FMB:[id number] DOB: Oct 11, 1920 DOA: 03/11/2017  PCP: Hoyt Koch, MD  Admit date: 03/11/2017 Discharge date: 03/13/2017  Recommendations for Outpatient Follow-up:  1. Follow-up chest pain 2. Further evaluation of right shoulder pain, note right shoulder soft tissue mass, likely lipoma. 3. Chronic thrombocytopenia of unclear significance.  Follow-up Information    Hoyt Koch, MD. Schedule an appointment as soon as possible for a visit in 2 week(s).   Specialty:  Internal Medicine Contact information: [place] [id number] [phone]        Deboraha Sprang, MD. Schedule an appointment as soon as possible for a visit in 2 week(s).   Specialty:  Cardiology Contact information: [address] [place] [address] [phone]            Discharge Diagnoses:  1. Chest pain, ACS ruled out 2. Abnormal nuclear study 3. CAD 4. Borderline systolic CHF, chronic diastolic CHF 5. Essential HTN 6. DM type 2 with peripheral neuropathy  7. Chronic thrombocytopenia 8. 3 cm ovoid mobile mass overlying right deltoid, nontender  Discharge Condition: improved Disposition: home  Diet recommendation: heart healthy, diabetic diet  Filed Weights   03/11/17 1223 03/12/17 0500 03/13/17 0522  Weight: 68.5 kg (151 lb) 67.7 kg (149 lb 4.8 oz) 68.1 kg (150 lb 3.2 oz)    History of present illness:  14yom PMH CAD, CABG, DM, presented with chest pain without SOB. Admitted for CP evaluation.  Hospital Course:  ACS ruled out and no further CP. Seen by cardiology, underwent NM stress which showed ischemia but was not a high risk study and therefore cardiology recommended medical management only by increasing Norvasc and adding nitrates as well as stopping beta blocker secondary to bradycardia. Echo showed decrease in LVEF with diffuse hyperkinesis.  Euvolemic and therefore no Lasix.  Hospitalization was uncomplicated except for mild confusion, likely sundowning.  Chest pain resolved without recurrence. Troponins were negative, EKG nonacute.  Hyperlipidemia, continue statin  Essential hypertension, stable  Diabetes mellitus type 2 with peripheral neuropathy remains stable. Resume oral medication on discharge.  Chronic thrombocytopenia remained stable  Discussed with family: Daughter at bedside and son-in-law who is an anesthesiologist by telephone  Consultations: Cardiology   Procedures:  Echo Study Conclusions  - Left ventricle: The cavity size was normal. There was mild   concentric hypertrophy. Systolic function was mildly reduced. The   estimated ejection fraction was in the range of 45% to 50%. Mild   diffuse hypokinesis. Features are consistent with a pseudonormal   left ventricular filling pattern, with concomitant abnormal   relaxation and increased filling pressure (grade 2 diastolic   dysfunction). Doppler parameters are consistent with high   ventricular filling pressure. - Aortic valve: Transvalvular velocity was within the normal range.   There was no stenosis. There was no regurgitation. - Mitral valve: Severely calcified annulus. Transvalvular velocity   was within the normal range. There was no evidence for stenosis.   There was mild regurgitation. - Left atrium: The atrium was severely dilated. - Right ventricle: The cavity size was normal. Wall thickness was   normal. Systolic function was normal. - Right atrium: The atrium was moderately dilated. - Tricuspid valve: There was mild regurgitation.  Today's assessment: S: Confused and combative this AM Patient has no complaints. No chest pain or shortness of breath. O: Vitals: afebrile, 97.9, 52, 121/66, 99% on RA  Constitutional:  Appears calm and comfortable sitting in  chair Respiratory:  CTA bilaterally, no w/r/r.  Respiratory effort normal.  Cardiovascular:  RRR, no m/r/g.  Telemetry SR. Psychiatric:  Mental status Mood, affect appropriate, mildly confused  Blood sugars stable  Discharge Instructions  Discharge Instructions    Activity as tolerated - No restrictions    Complete by:  As directed    Diet - low sodium heart healthy    Complete by:  As directed    Diet Carb Modified    Complete by:  As directed    Discharge instructions    Complete by:  As directed    Call your physician or seek immediate medical attention for chest pain, shortness of breath, swelling, weight gain or worsening of condition.     Allergies as of 03/13/2017      Reactions   Codeine Nausea And Vomiting, Other (See Comments)   hallucinations   Microzide [hydrochlorothiazide] Other (See Comments)   Acute gout on LosartanHCT 12/2014   Ace Inhibitors Other (See Comments)    cough      Medication List    STOP taking these medications   carvedilol 3.125 MG tablet Commonly known as:  COREG     TAKE these medications   amLODipine 10 MG tablet Commonly known as:  NORVASC Take 1 tablet (10 mg total) by mouth daily. What changed:  how much to take   aspirin 81 MG tablet Take 81 mg by mouth daily.   atorvastatin 40 MG tablet Commonly known as:  LIPITOR Take 40 mg by mouth daily.   diclofenac sodium 1 % Gel Commonly known as:  VOLTAREN Apply 2 g topically 2 (two) times daily as needed.   fish oil-omega-3 fatty acids 1000 MG capsule Take 1 g by mouth 2 (two) times daily.   gabapentin 600 MG tablet Commonly known as:  NEURONTIN Take 600 mg by mouth 3 (three) times daily.   insulin lispro 100 UNIT/ML injection Commonly known as:  HUMALOG sliding scale. Blood Glucose  <150 None  150-199 1U  200-249 2U 250-299 3U 300-349 4U >350 5U   Insulin Pen Needle 32G X 4 MM Misc As directed per insulin dose and sliding scale   isosorbide dinitrate 10 MG tablet Commonly known as:  ISORDIL Take 1 tablet (10 mg total) by mouth 3 (three) times daily.   LANTUS 100 UNIT/ML  injection Generic drug:  insulin glargine Inject 8 Units into the skin 2 (two) times daily.   latanoprost 0.005 % ophthalmic solution Commonly known as:  XALATAN Place 1 drop into both eyes at bedtime.   losartan 100 MG tablet Commonly known as:  COZAAR Take 1 tablet (100 mg total) by mouth daily. Overdue for annual appt w/labs must see MD for refills   MYRBETRIQ 25 MG Tb24 tablet Generic drug:  mirabegron ER Take 25 mg by mouth daily.   sitaGLIPtin 100 MG tablet Commonly known as:  JANUVIA Take 50 mg by mouth daily.   tamsulosin 0.4 MG Caps capsule Commonly known as:  FLOMAX Take 0.4 mg by mouth daily.      Allergies  Allergen Reactions  . Codeine Nausea And Vomiting and Other (See Comments)    hallucinations  . Microzide [Hydrochlorothiazide] Other (See Comments)    Acute gout on LosartanHCT 12/2014  . Ace Inhibitors Other (See Comments)     cough    The results of significant diagnostics from this hospitalization (including imaging, microbiology, ancillary and laboratory) are listed below for reference.    Significant Diagnostic Studies: Dg  Chest 2 View  Result Date: 03/11/2017 CLINICAL DATA:  Left-sided chest pain for several hours EXAM: CHEST  2 VIEW COMPARISON:  06/19/2015 FINDINGS: Cardiac shadow is mildly prominent. Postoperative changes are again seen. The lungs are well aerated bilaterally. No focal infiltrate or sizable effusion is seen. Degenerative change of the thoracic spine is noted. IMPRESSION: No active cardiopulmonary disease. Electronically Signed   By: Inez Catalina M.D.   On: 03/11/2017 13:48   Nm Myocar Multi W/spect W/wall Motion / Ef  Result Date: 03/12/2017  There was no ST segment deviation noted during stress.  T wave inversion was noted during stress.  Findings consistent with ischemia.  This is an intermediate risk study.  The left ventricular ejection fraction is moderately decreased (30-44%).  Small area of inferior and lateral wall  ischemia at mid and basal level SDS 4 EF estimated at 36% with diffuse hypokinesis suggest echo correlation    Labs: Basic Metabolic Panel:  Recent Labs Lab 03/11/17 1259  NA 135  K 3.6  CL 105  CO2 23  GLUCOSE 213*  BUN 18  CREATININE 1.24  CALCIUM 8.3*   CBC:  Recent Labs Lab 03/11/17 1259  WBC 5.0  HGB 12.3*  HCT 37.3*  MCV 85.0  PLT 127*   Cardiac Enzymes:  Recent Labs Lab 03/11/17 2305 03/12/17 0107 03/12/17 0403  TROPONINI <0.03 <0.03 <0.03    CBG:  Recent Labs Lab 03/11/17 2210 03/12/17 0743 03/12/17 1237 03/12/17 1610 03/13/17 0817  GLUCAP 236* 113* 152* 270* 166*    Principal Problem:   Chest pain Active Problems:   Uncontrolled type 2 diabetes with renal manifestation (HCC)   Hyperlipidemia   Essential hypertension   Coronary atherosclerosis   Chronic renal insufficiency, stage III (moderate) (HCC)   Thrombocytopenia (HCC)   Mass of skin of right shoulder   Time coordinating discharge: 35 minutes  Signed:  Murray Hodgkins, MD [facility] 03/13/2017, 11:11 AM

## 2017-03-13 NOTE — Progress Notes (Signed)
Ativan 1 mg IV given for continued agitation. Resting in bed and becoming more cooperative while security officers still in room.

## 2017-03-13 NOTE — Clinical Social Work Note (Signed)
Clinical Social Worker received notification that patient is from [place] and patient family here to provide transportation.  CSW spoke with Delcie Roch at [facility] who confirmed completed fax with discharge summary and orders.  Facility agreeable with patient return, does not require FL2 due to length of hospitalization and family ready to provide transportation.  RN updated.    Clinical Social Worker will sign off for now as social work intervention is no longer needed. Please consult [place] again if new need arises.  Barbette Or, [place]

## 2017-03-13 NOTE — Progress Notes (Signed)
Pt confused and pulling off heart monitor at 0725 this am. Pt saying that he is at his house and wants everyone out of his room. Pt attempting oob. Unsteady on feet and confused about surroundings. Becoming increasingly more agitated and violent. Charge nurse and security called into room at 0730. Pt assisted back to bed and cooperating more with staff, but still agitated and confused. Dr. Sarajane Jews notified. Order received for Ativan IV.

## 2017-03-13 NOTE — Progress Notes (Signed)
Pt back to baseline orientation x 4. D/C instructions and Rx received. Pt education for d/c completed with pt and daughter. Verbalized understanding. [facility] [place] aware of pt's return today. D/C to ALF via w/c to front entrance with daughter.

## 2017-03-15 ENCOUNTER — Telehealth: Payer: Self-pay | Admitting: *Deleted

## 2017-03-15 NOTE — Telephone Encounter (Signed)
Called pt to set-up TCM appt pt states he will be seeing the providers at [facility] on this Friday. Decline to make appt w/[place]...Johny Chess

## 2017-03-29 NOTE — Progress Notes (Signed)
Cardiology Office Note   Date:  04/01/2017   ID:  Leonard [name], DOB 08-16-1920, MRN [id number]  PCP:  Hoyt Koch, MD  Cardiologist:   Jenkins Rouge, MD [name]  No chief complaint on file.     History of Present Illness: Leonard [name] is a 83 y.o. male who presents for post hospital f/u. Seen in consultation 03/12/17.  History of ischemic heart disease CABG with redo in 1997 last cath 2005 with patent LIMA and patent SVG to OM and PDA. Echo in 2012 showed EF 55% with inferior septum. He has done well and not followed closely by cardiology Lives at [name]. Was sitting outside and stumbled when he got up and had SSCP. IN ER minor nagging left sided pain. Not positional or pleuritic IN ER R/O no acute ECG changes  Seen by Dr Percival Spanish in hospital. Myovue reviewed 03/12/17 Small area of inferior and lateral wall ischemia mid and basal level STS 4 Echo 03/12/17 reviewed EF 45-50%  Mild MR Severe LAE  Rx medically given age and R/O Nitrates added and norvasc dose increased   Living at [name] and wife in memory unit. Back on stationary bike daily Gait is poor and fall risk Some muscular left shoulder/pectoral pain    Past Medical History:  Diagnosis Date  . Bladder tumor    hematuria  . Carotid stenosis, bilateral per doppler 08-20-09   mild  . Coronary artery disease    A. S/P CABG w/ Redo CABG in 1997;  b. 2005 Cath: LIMA->LAD ok, VG->OM ok, VG->RPDA->RPLV ok. Severe native 3VD.  Leonard [name] DDD (degenerative disc disease), lumbar    s/p fusion 4-5  . Diabetes mellitus    oral med and insulin  . Diabetic peripheral neuropathy (HCC)    feet  . Diastolic dysfunction    a. 05/2011 Echo: EF 55%, Gr1 DD, mild to mod MR, mildly to mod dil LA, mild TR.  . Glaucoma bilateral    eye drop rx  . History of hiatal hernia    denies reflux  . HOH (hard of hearing) no hearing aids  . Hypertension   . Mass of skin of right shoulder 03/12/2017  . Nocturia   . Prostate cancer  [facility]) 1996   s/p external radiation  . Renal insufficiency hx  . Syncope 06/06/2011   In context of dehydration and bradycardia.  . Thrombocytopenia ([place]) 03/12/2017    Past Surgical History:  Procedure Laterality Date  . CARDIAC CATHETERIZATION  last cath 2005   per dr Olevia Perches- w/ chart (grafts patent)-  results w/ chart  . CATARACT EXTRACTION W/ INTRAOCULAR LENS  IMPLANT, BILATERAL    . CORONARY ARTERY BYPASS GRAFT  1984   4 vessels  . CORONARY ARTERY BYPASS GRAFT  1997- redo   3 vessels  . LIPOMA EXCISION  1970's   from back  . LUMBAR FUSION  01/2004   L 4-5  . TRANSURETHRAL RESECTION OF BLADDER TUMOR  04/16/2011   Procedure: TRANSURETHRAL RESECTION OF BLADDER TUMOR (TURBT);  Surgeon: Claybon Jabs;  Location: [facility];  Service: Urology;  Laterality: N/A;  . UPPER GASTROINTESTINAL ENDOSCOPY     dx hiatal hernia     Current Outpatient Prescriptions  Medication Sig Dispense Refill  . amLODipine (NORVASC) 10 MG tablet Take 1 tablet (10 mg total) by mouth daily. 30 tablet 0  . aspirin 81 MG tablet Take 81 mg by mouth daily.     Leonard [name] atorvastatin (LIPITOR) 40  MG tablet Take 40 mg by mouth daily.    . carvedilol (COREG) 3.125 MG tablet Take 3.125 mg by mouth 2 (two) times daily.  0  . diclofenac sodium (VOLTAREN) 1 % GEL Apply 2 g topically 2 (two) times daily as needed. 100 g 3  . fish oil-omega-3 fatty acids 1000 MG capsule Take 1 g by mouth 2 (two) times daily.     Leonard [name] gabapentin (NEURONTIN) 600 MG tablet Take 600 mg by mouth 3 (three) times daily.      . insulin glargine (LANTUS) 100 UNIT/ML injection Inject 8 Units into the skin 2 (two) times daily.    . Insulin Pen Needle 32G X 4 MM MISC As directed per insulin dose and sliding scale 100 each 1  . isosorbide dinitrate (ISORDIL) 10 MG tablet Take 1 tablet (10 mg total) by mouth 3 (three) times daily. 90 tablet 0  . latanoprost (XALATAN) 0.005 % ophthalmic solution Place 1 drop into both eyes at bedtime.    Leonard [name]  losartan (COZAAR) 100 MG tablet Take 1 tablet (100 mg total) by mouth daily. Overdue for annual appt w/labs must see MD for refills 30 tablet 0  . Tamsulosin HCl (FLOMAX) 0.4 MG CAPS Take 0.4 mg by mouth daily.     No current facility-administered medications for this visit.     Allergies:   Codeine; Microzide [hydrochlorothiazide]; and Ace inhibitors    Social History:  The patient  reports that he has never smoked. He has never used smokeless tobacco. He reports that he does not drink alcohol or use drugs.   Family History:  The patient's family history includes Diabetes in his sister.    ROS:  Please see the history of present illness.   Otherwise, review of systems are positive for none.   All other systems are reviewed and negative.    PHYSICAL EXAM: VS:  BP (!) 162/72   Pulse 78   Ht 5\' 7"  (1.702 m)   Wt 153 lb 8 oz (69.6 kg)   SpO2 98%   BMI 24.04 kg/m  , BMI Body mass index is 24.04 kg/m. Affect appropriate Healthy:  appears stated age 26: normal Neck supple with no adenopathy JVP normal no bruits no thyromegaly Lungs clear with no wheezing and good diaphragmatic motion Heart:  S1/S2 no murmur, no rub, gallop or click PMI normal Abdomen: benighn, BS positve, no tenderness, no AAA no bruit.  No HSM or HJR Distal pulses intact with no bruits No edema Neuro non-focal Skin warm and dry No muscular weakness    EKG:  SB rate 48 long PR with wenkebach lateral T wave changes    Recent Labs: 03/11/2017: BUN 18; Creatinine, Ser 1.24; Hemoglobin 12.3; Platelets 127; Potassium 3.6; Sodium 135    Lipid Panel    Component Value Date/Time   CHOL 136 03/11/2017 1531   TRIG 84 03/11/2017 1531   HDL 46 03/11/2017 1531   CHOLHDL 3.0 03/11/2017 1531   VLDL 17 03/11/2017 1531   LDLCALC 73 03/11/2017 1531      Wt Readings from Last 3 Encounters:  04/01/17 153 lb 8 oz (69.6 kg)  03/13/17 150 lb 3.2 oz (68.1 kg)  01/05/17 148 lb (67.1 kg)      Other studies  Reviewed: Additional studies/ records that were reviewed today include: Myovue / Echo October 2018 progress notes Dr Therese Sarah and [place] .    ASSESSMENT AND PLAN:  1.  CAD/CABG redo 1007 cath 2015 patent LIMA to  LAD and patent SVG to PDA and OM. Recent admission 03/11/17 for  Chest pain. R/O low risk myovue medical Rx given age and stability of symptoms  2. Ischemic DCM:  EF 45-50% by echo 3. DM:  Discussed low carb diet.  Target hemoglobin A1c is 6.5 or less.  Continue current medications. 4. Cholesterol on statin  5. HTN:  Well controlled.  Continue current medications and low sodium Dash type diet.       Current medicines are reviewed at length with the patient today.  The patient does not have concerns regarding medicines.  The following changes have been made:  no change  Labs/ tests ordered today include: None  No orders of the defined types were placed in this encounter.    Disposition:   FU with me in 6 months      Signed, Jenkins Rouge, MD  04/01/2017 2:00 PM    [address], [place], [place]  [address] Phone: [phone]; Fax: [phone]

## 2017-04-01 ENCOUNTER — Encounter: Payer: Self-pay | Admitting: Cardiovascular Disease

## 2017-04-01 ENCOUNTER — Ambulatory Visit (INDEPENDENT_AMBULATORY_CARE_PROVIDER_SITE_OTHER): Payer: Federal, State, Local not specified - PPO | Admitting: Cardiovascular Disease

## 2017-04-01 VITALS — BP 162/72 | HR 78 | Ht 67.0 in | Wt 153.5 lb

## 2017-04-01 DIAGNOSIS — I2510 Atherosclerotic heart disease of native coronary artery without angina pectoris: Secondary | ICD-10-CM

## 2017-04-01 NOTE — Patient Instructions (Signed)

## 2017-04-18 ENCOUNTER — Other Ambulatory Visit: Payer: Self-pay | Admitting: Internal Medicine

## 2017-05-18 ENCOUNTER — Telehealth: Payer: Self-pay | Admitting: Internal Medicine

## 2017-05-18 NOTE — Telephone Encounter (Deleted)
Copied from [place] [id number]. Topic: Quick Communication - See Telephone Encounter >> May 18, 2017  1:05 PM Valla Leaver wrote: CRM for notification. See Telephone encounter for: 05/18/17. [place] and Wellness Rn, calling from [place] to get additional information RE TB test results. She is refaxing the form that needs to include TB expiration, lot#, etc. Okay to fax this informaiton to F: [phone]

## 2017-05-18 NOTE — Telephone Encounter (Signed)
Copied from [place] [id number]. Topic: Quick Communication - See Telephone Encounter >> May 18, 2017  1:05 PM Valla Leaver wrote: CRM for notification. See Telephone encounter for: 05/18/17. [place] and Wellness Rn, calling from [place] to get additional information RE TB test results. She is refaxing the form that needs to include TB expiration, lot#, etc. Okay to fax this informaiton to F: [phone]  I see a TB on 8/3 with Dr.Schmitz. Have you received this form?

## 2017-05-19 NOTE — Telephone Encounter (Signed)
Faxed TB results to Fax given, did not receive form to fill out

## 2017-05-22 ENCOUNTER — Emergency Department (HOSPITAL_COMMUNITY)
Admission: EM | Admit: 2017-05-22 | Discharge: 2017-05-22 | Disposition: A | Discharge status: Home / Self Care | Payer: Medicare Other | Attending: Emergency Medicine | Admitting: Emergency Medicine

## 2017-05-22 ENCOUNTER — Encounter (HOSPITAL_COMMUNITY): Payer: Self-pay

## 2017-05-22 ENCOUNTER — Emergency Department (HOSPITAL_COMMUNITY): Payer: Medicare Other

## 2017-05-22 DIAGNOSIS — Z7982 Long term (current) use of aspirin: Secondary | ICD-10-CM | POA: Diagnosis not present

## 2017-05-22 DIAGNOSIS — Y939 Activity, unspecified: Secondary | ICD-10-CM | POA: Insufficient documentation

## 2017-05-22 DIAGNOSIS — M549 Dorsalgia, unspecified: Secondary | ICD-10-CM

## 2017-05-22 DIAGNOSIS — R112 Nausea with vomiting, unspecified: Secondary | ICD-10-CM | POA: Insufficient documentation

## 2017-05-22 DIAGNOSIS — Z8546 Personal history of malignant neoplasm of prostate: Secondary | ICD-10-CM | POA: Insufficient documentation

## 2017-05-22 DIAGNOSIS — R42 Dizziness and giddiness: Secondary | ICD-10-CM | POA: Insufficient documentation

## 2017-05-22 DIAGNOSIS — Y999 Unspecified external cause status: Secondary | ICD-10-CM | POA: Insufficient documentation

## 2017-05-22 DIAGNOSIS — Z794 Long term (current) use of insulin: Secondary | ICD-10-CM | POA: Diagnosis not present

## 2017-05-22 DIAGNOSIS — I129 Hypertensive chronic kidney disease with stage 1 through stage 4 chronic kidney disease, or unspecified chronic kidney disease: Secondary | ICD-10-CM | POA: Insufficient documentation

## 2017-05-22 DIAGNOSIS — N183 Chronic kidney disease, stage 3 (moderate): Secondary | ICD-10-CM | POA: Insufficient documentation

## 2017-05-22 DIAGNOSIS — Z951 Presence of aortocoronary bypass graft: Secondary | ICD-10-CM | POA: Insufficient documentation

## 2017-05-22 DIAGNOSIS — E1140 Type 2 diabetes mellitus with diabetic neuropathy, unspecified: Secondary | ICD-10-CM | POA: Insufficient documentation

## 2017-05-22 DIAGNOSIS — W01198A Fall on same level from slipping, tripping and stumbling with subsequent striking against other object, initial encounter: Secondary | ICD-10-CM | POA: Insufficient documentation

## 2017-05-22 DIAGNOSIS — Y92121 Bathroom in nursing home as the place of occurrence of the external cause: Secondary | ICD-10-CM | POA: Diagnosis not present

## 2017-05-22 DIAGNOSIS — E1122 Type 2 diabetes mellitus with diabetic chronic kidney disease: Secondary | ICD-10-CM | POA: Insufficient documentation

## 2017-05-22 DIAGNOSIS — I2510 Atherosclerotic heart disease of native coronary artery without angina pectoris: Secondary | ICD-10-CM | POA: Diagnosis not present

## 2017-05-22 DIAGNOSIS — S0990XA Unspecified injury of head, initial encounter: Secondary | ICD-10-CM | POA: Diagnosis not present

## 2017-05-22 DIAGNOSIS — W19XXXA Unspecified fall, initial encounter: Secondary | ICD-10-CM

## 2017-05-22 LAB — CBC WITH DIFFERENTIAL/PLATELET
Basophils Absolute: 0 10*3/uL (ref 0.0–0.1)
Basophils Relative: 0 %
Eosinophils Absolute: 0 10*3/uL (ref 0.0–0.7)
Eosinophils Relative: 1 %
HEMATOCRIT: 33.3 % — AB (ref 39.0–52.0)
Hemoglobin: 11.4 g/dL — ABNORMAL LOW (ref 13.0–17.0)
LYMPHS PCT: 18 %
Lymphs Abs: 1.3 10*3/uL (ref 0.7–4.0)
MCH: 29 pg (ref 26.0–34.0)
MCHC: 34.2 g/dL (ref 30.0–36.0)
MCV: 84.7 fL (ref 78.0–100.0)
MONO ABS: 0.3 10*3/uL (ref 0.1–1.0)
MONOS PCT: 4 %
NEUTROS ABS: 5.6 10*3/uL (ref 1.7–7.7)
NEUTROS PCT: 77 %
Platelets: 121 10*3/uL — ABNORMAL LOW (ref 150–400)
RBC: 3.93 MIL/uL — ABNORMAL LOW (ref 4.22–5.81)
RDW: 14.4 % (ref 11.5–15.5)
WBC: 7.2 10*3/uL (ref 4.0–10.5)

## 2017-05-22 LAB — COMPREHENSIVE METABOLIC PANEL
ALT: 14 U/L — ABNORMAL LOW (ref 17–63)
ANION GAP: 6 (ref 5–15)
AST: 22 U/L (ref 15–41)
Albumin: 3.1 g/dL — ABNORMAL LOW (ref 3.5–5.0)
Alkaline Phosphatase: 91 U/L (ref 38–126)
BILIRUBIN TOTAL: 1.1 mg/dL (ref 0.3–1.2)
BUN: 16 mg/dL (ref 6–20)
CO2: 19 mmol/L — ABNORMAL LOW (ref 22–32)
Calcium: 7.2 mg/dL — ABNORMAL LOW (ref 8.9–10.3)
Chloride: 110 mmol/L (ref 101–111)
Creatinine, Ser: 1.01 mg/dL (ref 0.61–1.24)
GFR calc Af Amer: >60 mL/min (ref >60)
Glucose, Bld: 217 mg/dL — ABNORMAL HIGH (ref 65–99)
POTASSIUM: 3.8 mmol/L (ref 3.5–5.1)
Sodium: 135 mmol/L (ref 135–145)
TOTAL PROTEIN: 5.7 g/dL — AB (ref 6.5–8.1)

## 2017-05-22 LAB — URINALYSIS, ROUTINE W REFLEX MICROSCOPIC
Bacteria, UA: NONE SEEN
Bilirubin Urine: NEGATIVE
Glucose, UA: 50 mg/dL — AB
Ketones, ur: 5 mg/dL — AB
LEUKOCYTES UA: NEGATIVE
NITRITE: NEGATIVE
PH: 6 (ref 5.0–8.0)
Protein, ur: 100 mg/dL — AB
SPECIFIC GRAVITY, URINE: 1.021 (ref 1.005–1.030)
SQUAMOUS EPITHELIAL / LPF: NONE SEEN

## 2017-05-22 LAB — LIPASE, BLOOD: LIPASE: 30 U/L (ref 11–51)

## 2017-05-22 MED ORDER — ACETAMINOPHEN 325 MG PO TABS
650.0000 mg | ORAL_TABLET | Freq: Once | ORAL | Status: AC
  Administered 2017-05-22: 650 mg via ORAL
  Filled 2017-05-22: qty 2

## 2017-05-22 MED ORDER — IOPAMIDOL (ISOVUE-300) INJECTION 61%
INTRAVENOUS | Status: AC
Start: 2017-05-22 — End: 2017-05-22
  Administered 2017-05-22: 100 mL
  Filled 2017-05-22: qty 100

## 2017-05-22 NOTE — Discharge Instructions (Signed)
You can take 650 mg of Tylenol every 6 hours as needed for back pain. Applying heating pads can also help with pain. You can also apply lidocaine gel to the skin or over the counter lidocaine patches to help with pain. Please avoid NSAIDs such as ibuprofen or naproxen for your pain.  If you develop new or worsening symptoms including fever, chills, have another fall, chest pain, shortness of breath, or vomiting, please return to the Emergency Department for re-evaluation.

## 2017-05-22 NOTE — ED Notes (Signed)
Pt ambulated well with assistance x 1

## 2017-05-22 NOTE — ED Provider Notes (Signed)
[facility] EMERGENCY DEPARTMENT Provider Note   CSN: [id number] Arrival date & time: 05/22/17  1035     History   Chief Complaint Chief Complaint  Patient presents with  . Fall  . Dizziness    HPI Leonard [name] is a 83 y.o. male with a history of bilateral carotid stenosis, thrombocytopenia, prostate cancer s/p radiation, CKD stage III, DM, HTN, CAD s/p CABG in 1997 and 2005 who presents to the emergency department with a chief complaint of back pain.  The patient reports sudden onset bilateral mid-back pain that radiates to the midline that began when he awoke this morning.  He denies trauma or injury.  He reports that he was able to ambulate with his walker to breakfast, but the pain continued so he went back to his chair to sit down.  He states that when he was sitting that he became nauseated and felt as if he had a vomit.  He pressed the call bell the side of his chair, but didn't get an immediate response so he ambulated to the bathroom. He knelt down in front of the commode and vomited, but then fell backwards onto his right side and hit his head. He was unable to get up until staff arrived.  He states that he did not pass out, he was just unable to get up.He reports the nausea has resolved and had he has had no additional episodes of emesis.  He reports no change in the back pain, which he characterizes as "deep".  He denies dysuria, frequency, hesitancy, constipation, fever, chills, abdominal pain, dyspnea, or chest pain.    He was seen by Dr. Redmond Baseman with ENT on 05/12/2017 for intermittent dizziness.  His family reports that the patient has been unable to fully characterize his associated symptoms and onset of his dizziness so the patient was started on meclizine at the visit but has since discontinued the medication.  He denies dizziness or lightheadedness today.  No headache, tinnitus, otalgia, or visual disturbances.  The patient's son-in-law reports that the  facility reported that his a.m. blood sugar was over 300.  He reports that he was recently established with a new endocrinologist, but they have struggled to get good control of his blood sugar.  He is not anticoagulated.  The history is provided by the patient and the EMS personnel. No language interpreter was used.    Past Medical History:  Diagnosis Date  . Bladder tumor    hematuria  . Carotid stenosis, bilateral per doppler 08-20-09   mild  . Coronary artery disease    A. S/P CABG w/ Redo CABG in 1997;  b. 2005 Cath: LIMA->LAD ok, VG->OM ok, VG->RPDA->RPLV ok. Severe native 3VD.  Marland Kitchen DDD (degenerative disc disease), lumbar    s/p fusion 4-5  . Diabetes mellitus    oral med and insulin  . Diabetic peripheral neuropathy (HCC)    feet  . Diastolic dysfunction    a. 05/2011 Echo: EF 55%, Gr1 DD, mild to mod MR, mildly to mod dil LA, mild TR.  . Glaucoma bilateral    eye drop rx  . History of hiatal hernia    denies reflux  . HOH (hard of hearing) no hearing aids  . Hypertension   . Mass of skin of right shoulder 03/12/2017  . Nocturia   . Prostate cancer [facility]) 1996   s/p external radiation  . Renal insufficiency hx  . Syncope 06/06/2011   In context of dehydration and  bradycardia.  . Thrombocytopenia ([place]) 03/12/2017    Patient Active Problem List   Diagnosis Date Noted  . Thrombocytopenia ([place]) 03/12/2017  . Mass of skin of right shoulder 03/12/2017  . Glaucoma 03/11/2017  . Hiatal hernia 03/11/2017  . Chest pain 03/11/2017  . Left leg weakness 06/29/2016  . Degenerative arthritis of left knee 05/25/2016  . Left knee pain 03/24/2016  . History of prostate cancer 06/12/2014  . Chronic renal insufficiency, stage III (moderate) ([place]) 06/12/2014  . Non-compliant behavior 10/18/2013  . Abnormal computed tomography of pancreas or bile duct 09/21/2011  . Low back pain with sciatica 07/07/2010  . Uncontrolled type 2 diabetes with renal manifestation ([place]) 03/06/2008     Class: Chronic  . Hyperlipidemia 03/06/2008  . Hx of completed stroke 03/06/2008  . Diabetes ([place]) 07/26/2007  . Essential hypertension 07/26/2007    Class: Chronic  . CAROTID BRUIT 07/26/2007  . Coronary atherosclerosis 07/04/2006    Class: Chronic  . OSTEOPOROSIS 07/04/2006    Past Surgical History:  Procedure Laterality Date  . CARDIAC CATHETERIZATION  last cath 2005   per dr Olevia Perches- w/ chart (grafts patent)-  results w/ chart  . CATARACT EXTRACTION W/ INTRAOCULAR LENS  IMPLANT, BILATERAL    . CORONARY ARTERY BYPASS GRAFT  1984   4 vessels  . CORONARY ARTERY BYPASS GRAFT  1997- redo   3 vessels  . LIPOMA EXCISION  1970's   from back  . LUMBAR FUSION  01/2004   L 4-5  . TRANSURETHRAL RESECTION OF BLADDER TUMOR  04/16/2011   Procedure: TRANSURETHRAL RESECTION OF BLADDER TUMOR (TURBT);  Surgeon: Claybon Jabs;  Location: [facility];  Service: Urology;  Laterality: N/A;  . UPPER GASTROINTESTINAL ENDOSCOPY     dx hiatal hernia       Home Medications    Prior to Admission medications   Medication Sig Start Date End Date Taking? Authorizing Provider  amLODipine (NORVASC) 10 MG tablet Take 1 tablet (10 mg total) by mouth daily. 03/13/17   Samuella Cota, MD  amLODipine (NORVASC) 10 MG tablet TAKE 1 TABLET BY MOUTH EVERY DAY 04/19/17   Hoyt Koch, MD  aspirin 81 MG tablet Take 81 mg by mouth daily.     [provider]  atorvastatin (LIPITOR) 40 MG tablet Take 40 mg by mouth daily.    [provider]  carvedilol (COREG) 3.125 MG tablet Take 3.125 mg by mouth 2 (two) times daily. 03/08/17   [provider]  diclofenac sodium (VOLTAREN) 1 % GEL Apply 2 g topically 2 (two) times daily as needed. 02/04/17   Rosemarie Ax, MD  fish oil-omega-3 fatty acids 1000 MG capsule Take 1 g by mouth 2 (two) times daily.     [provider]  gabapentin (NEURONTIN) 600 MG tablet Take 600 mg by mouth 3 (three) times daily.       [provider]  insulin glargine (LANTUS) 100 UNIT/ML injection Inject 8 Units into the skin 2 (two) times daily.    [provider]  Insulin Pen Needle 32G X 4 MM MISC As directed per insulin dose and sliding scale 02/04/17   Rosemarie Ax, MD  isosorbide dinitrate (ISORDIL) 10 MG tablet Take 1 tablet (10 mg total) by mouth 3 (three) times daily. 03/13/17   Samuella Cota, MD  latanoprost (XALATAN) 0.005 % ophthalmic solution Place 1 drop into both eyes at bedtime.    [provider]  losartan (COZAAR) 100 MG  tablet Take 1 tablet (100 mg total) by mouth daily. Overdue for annual appt w/labs must see MD for refills 11/17/16   Hoyt Koch, MD  Tamsulosin HCl (FLOMAX) 0.4 MG CAPS Take 0.4 mg by mouth daily.    [provider]    Family History Family History  Problem Relation Age of Onset  . Diabetes Sister   . Cancer Neg Hx   . Heart disease Neg Hx   . Stroke Neg Hx     Social History Social History   Tobacco Use  . Smoking status: Never Smoker  . Smokeless tobacco: Never Used  Substance Use Topics  . Alcohol use: No  . Drug use: No     Allergies   Codeine; Microzide [hydrochlorothiazide]; and Ace inhibitors   Review of Systems Review of Systems  Constitutional: Negative for chills and fever.  HENT: Negative for ear pain and tinnitus.   Eyes: Negative for visual disturbance.  Respiratory: Negative for shortness of breath.   Cardiovascular: Negative for chest pain.  Gastrointestinal: Positive for nausea and vomiting. Negative for abdominal pain, constipation and diarrhea.  Genitourinary: Negative for dysuria, hematuria and urgency.  Musculoskeletal: Positive for back pain.  Skin: Negative for rash and wound.  Allergic/Immunologic: Positive for immunocompromised state.  Neurological: Positive for dizziness (chronic, intermittent). Negative for syncope, weakness, light-headedness and headaches.  Psychiatric/Behavioral:  Negative for confusion.     Physical Exam Updated Vital Signs BP (!) 180/93   Pulse 66   Temp 97.7 F (36.5 C) (Oral)   Resp 14   Ht 5\' 6"  (1.676 m)   Wt 68.5 kg (151 lb)   SpO2 96%   BMI 24.37 kg/m   Physical Exam  Constitutional: He is oriented to person, place, and time. He appears well-developed.  HENT:  Head: Normocephalic.  Eyes: Conjunctivae and EOM are normal. Pupils are equal, round, and reactive to light. No scleral icterus.  Neck: Normal range of motion. Neck supple.  Full active and passive range of motion of the neck.  No meningeal signs.  Cardiovascular: Normal rate, regular rhythm and intact distal pulses.  No murmur heard. Pulmonary/Chest: Effort normal. No respiratory distress. He exhibits no tenderness.  Mild coarse breath sounds in the left bases.  Right lung is clear to auscultation.  Abdominal: Soft. Bowel sounds are normal. He exhibits no distension and no mass. There is no tenderness. There is no rebound and no guarding. No hernia.  Mild tenderness to palpation over the bilateral CVAs.  Neurological: He is alert and oriented to person, place, and time.  Cranial nerves grossly 2-12 intact. Finger-to-nose is normal. 5/5 motor strength of the bilateral upper and lower extremities. Moves all four extremities. NVI.    Skin: Skin is warm and dry. Capillary refill takes less than 2 seconds. No rash noted. No erythema. No pallor.  Psychiatric: His behavior is normal.  Nursing note and vitals reviewed.    ED Treatments / Results  Labs (all labs ordered are listed, but only abnormal results are displayed) Labs Reviewed  CBC WITH DIFFERENTIAL/PLATELET - Abnormal; Notable for the following components:      Result Value   RBC 3.93 (*)    Hemoglobin 11.4 (*)    HCT 33.3 (*)    Platelets 121 (*)    All other components within normal limits  COMPREHENSIVE METABOLIC PANEL - Abnormal; Notable for the following components:   CO2 19 (*)    Glucose, Bld 217 (*)      Calcium  7.2 (*)    Total Protein 5.7 (*)    Albumin 3.1 (*)    ALT 14 (*)    All other components within normal limits  URINALYSIS, ROUTINE W REFLEX MICROSCOPIC - Abnormal; Notable for the following components:   APPearance HAZY (*)    Glucose, UA 50 (*)    Hgb urine dipstick SMALL (*)    Ketones, ur 5 (*)    Protein, ur 100 (*)    All other components within normal limits  LIPASE, BLOOD    EKG  EKG Interpretation  Date/Time:  Sunday May 22 2017 10:56:53 EST Ventricular Rate:  60 PR Interval:    QRS Duration: 145 QT Interval:  507 QTC Calculation: 507 R Axis:   -36 Text Interpretation:  Sinus or ectopic atrial rhythm Prolonged PR interval Right bundle branch block Left ventricular hypertrophy Prolonged QT interval similar to prior EKG Confirmed by Malvin Johns [phone]) on 05/22/2017 11:14:09 AM       Radiology Dg Chest 2 View  Result Date: 05/22/2017 CLINICAL DATA:  Dizziness EXAM: CHEST  2 VIEW COMPARISON:  03/11/2017 FINDINGS: Mild cardiomegaly. Low volumes. Lungs grossly clear. Postoperative changes. No pneumothorax or pleural effusion. IMPRESSION: No active cardiopulmonary disease. Electronically Signed   By: Marybelle Killings M.D.   On: 05/22/2017 12:40   Ct Head Wo Contrast  Result Date: 05/22/2017 CLINICAL DATA:  Dizziness for 1 month EXAM: CT HEAD WITHOUT CONTRAST TECHNIQUE: Contiguous axial images were obtained from the base of the skull through the vertex without intravenous contrast. COMPARISON:  04/21/2015 FINDINGS: Brain: Diffuse atrophic changes are noted. Areas of decreased attenuation are noted in the deep white matter bilaterally consistent chronic white matter ischemic change. Vascular: No hyperdense vessel or unexpected calcification. Skull: Normal. Negative for fracture or focal lesion. Sinuses/Orbits: Surgical changes are noted in the maxillary antra bilaterally. Opacification of left maxillary antrum is again noted. Other: None. IMPRESSION: Chronic  atrophic and ischemic changes without acute abnormality. Electronically Signed   By: Inez Catalina M.D.   On: 05/22/2017 13:50   Ct Abdomen Pelvis W Contrast  Result Date: 05/22/2017 CLINICAL DATA:  Dizziness.  Nausea and vomiting. EXAM: CT ABDOMEN AND PELVIS WITH CONTRAST TECHNIQUE: Multidetector CT imaging of the abdomen and pelvis was performed using the standard protocol following bolus administration of intravenous contrast. CONTRAST:  16mL ISOVUE-300 IOPAMIDOL (ISOVUE-300) INJECTION 61% COMPARISON:  CT abdomen and pelvis from 04/28/2011 FINDINGS: Lower chest: No acute abnormality. The heart size appears enlarged. Previous median sternotomy and CABG procedure. Hepatobiliary: No focal liver abnormality. The gallbladder appears normal. No biliary dilatation. Pancreas: Cystic lesion within the head of pancreas measures 5.2 by 3.0 by 3.6 cm (volume = 29 cm^3). Previously 4.7 x 2.6 by 3.3 cm (volume = 21 cm^3). No pancreatic duct dilatation. Spleen: Unremarkable appearance of the spleen. Adrenals/Urinary Tract: Normal appearance of the right kidney. Simple appearing left kidney cyst measures 9.5 cm, image 49 of series 3. No kidney stone or hydronephrosis identified. The urinary bladder appears normal Stomach/Bowel: The stomach is unremarkable. The small bowel loops have a normal course and caliber. Normal appearance of the colon. Vascular/Lymphatic: Aortic atherosclerosis. No aneurysm. No adenopathy within the abdomen. No pelvic or inguinal adenopathy. Reproductive: The prostate gland Measures 5.5 by 4.5 by 4.7 cm (volume = 61 cm^3). Other: No free fluid or fluid collections within the abdomen or pelvis. Musculoskeletal: Right hip avn in noted. Post operative change from lumbar decompression and posterior hardware fusion identified. Multi level degenerative disc disease noted  throughout the lumbar spine. IMPRESSION: 1. No acute findings identified within the abdomen or pelvis. 2. Chronic cystic lesion within  head of pancreas is again noted and is slightly increased in size when compared with 04/28/2011. Although favored to represent a benign indolent process, consensus criteria recommends further evaluation with endoscopic ultrasound for enlarging pancreas cysts greater than 2.5 cm. This recommendation follows ACR consensus guidelines: Management of Incidental Pancreatic Cysts: A White Paper of the ACR Incidental Findings Committee. J Am Coll Radiol 1610;96:045-409. 3.  Aortic Atherosclerosis (ICD10-I70.0). 4. Prostate gland enlarged 5. Right hip avascular necrosis. Electronically Signed   By: Kerby Moors M.D.   On: 05/22/2017 13:57    Procedures Procedures (including critical care time)  Medications Ordered in ED Medications  acetaminophen (TYLENOL) tablet 650 mg (650 mg Oral Given 05/22/17 1140)  iopamidol (ISOVUE-300) 61 % injection (100 mLs  Contrast Given 05/22/17 1305)     Initial Impression / Assessment and Plan / ED Course  I have reviewed the triage vital signs and the nursing notes.  Pertinent labs & imaging results that were available during my care of the patient were reviewed by me and considered in my medical decision making (see chart for details).     83 year old male with a history of CAD s/p CABG 1997 and 2005, thrombocytopenia, CKD stage III, IDDM, HTN, and prostate cancer s/p external radiation presenting with acute atraumatic mid back pain that radiates to the midline with nausea and one episode of emesis. He also had a fall from kneeling backwards to the floor.  The patient was seen and evaluated with Dr. Tamera Punt, attending physician.  Last CT of the abdomen and pelvis in 2012 demonstrating marked prostate gland enlargement, stable bilobed cystic lesion involving the head and uncinate of the pancreas concerning for either a chronic pseudocyst or an intraductal mucinous low-grade neoplasm, bilateral hydronephrosis likely secondary to a markedly distended urinary bladder, 8 cm  cyst to the lower pole of the left kidney, and scattered diverticula.  Head CT is unremarkable for fracture, ICH or acute pathology. CXR is negative. Glucose is 217. No anion gap. EKG with no acute changes. Ca 7.3. Cr. 1.01.  CT abdomen pelvis with no acute findings, but the chronic cystic lesion of the head of the pancreas was again noted and mildly increased from previous scan.  These findings were discussed with the patient.  No nephrolithiasis or hydronephrosis.  No AAA.  Urinalysis not concerning for infection.  The patient was successfully PO challenged without N/V.  He was ambulatory with assistance without difficulty through the unit.  Discussed these findings with the patient's family, and I feel the patient is safe for discharge at this time.  Discussed the back pain may be more musculoskeletal in nature.  Recommended symptomatic treatment with Tylenol or topical lidocaine.  The family is in agreement with this plan.  Strict return precautions given.  No acute distress.  The patient is safe for discharge at this time.  Final Clinical Impressions(s) / ED Diagnoses   Final diagnoses:  Fall, initial encounter  Non-intractable vomiting with nausea, unspecified vomiting type  Musculoskeletal back pain    ED Discharge Orders    None       Joanne Gavel, PA-C 05/22/17 1520    Malvin Johns, MD 05/22/17 1555

## 2017-05-22 NOTE — ED Triage Notes (Signed)
Pt arrived via [facility] from [facility] c/o dizziness for the last month and abdominal pain starting today.

## 2017-06-08 ENCOUNTER — Other Ambulatory Visit: Payer: Self-pay | Admitting: Internal Medicine

## 2017-07-19 ENCOUNTER — Encounter (HOSPITAL_COMMUNITY): Payer: Self-pay

## 2017-07-19 ENCOUNTER — Other Ambulatory Visit: Payer: Self-pay

## 2017-07-19 ENCOUNTER — Emergency Department (HOSPITAL_COMMUNITY): Payer: Medicare Other

## 2017-07-19 ENCOUNTER — Emergency Department (HOSPITAL_COMMUNITY)
Admission: EM | Admit: 2017-07-19 | Discharge: 2017-07-19 | Disposition: A | Discharge status: Home / Self Care | Payer: Medicare Other | Attending: Emergency Medicine | Admitting: Emergency Medicine

## 2017-07-19 DIAGNOSIS — I5032 Chronic diastolic (congestive) heart failure: Secondary | ICD-10-CM | POA: Insufficient documentation

## 2017-07-19 DIAGNOSIS — M5441 Lumbago with sciatica, right side: Secondary | ICD-10-CM | POA: Diagnosis not present

## 2017-07-19 DIAGNOSIS — Z8546 Personal history of malignant neoplasm of prostate: Secondary | ICD-10-CM | POA: Insufficient documentation

## 2017-07-19 DIAGNOSIS — E785 Hyperlipidemia, unspecified: Secondary | ICD-10-CM | POA: Diagnosis not present

## 2017-07-19 DIAGNOSIS — E1140 Type 2 diabetes mellitus with diabetic neuropathy, unspecified: Secondary | ICD-10-CM | POA: Diagnosis not present

## 2017-07-19 DIAGNOSIS — G8929 Other chronic pain: Secondary | ICD-10-CM

## 2017-07-19 DIAGNOSIS — M545 Low back pain: Secondary | ICD-10-CM | POA: Diagnosis present

## 2017-07-19 DIAGNOSIS — I2510 Atherosclerotic heart disease of native coronary artery without angina pectoris: Secondary | ICD-10-CM | POA: Diagnosis not present

## 2017-07-19 DIAGNOSIS — Z79899 Other long term (current) drug therapy: Secondary | ICD-10-CM | POA: Insufficient documentation

## 2017-07-19 DIAGNOSIS — Z7982 Long term (current) use of aspirin: Secondary | ICD-10-CM | POA: Diagnosis not present

## 2017-07-19 DIAGNOSIS — Z8673 Personal history of transient ischemic attack (TIA), and cerebral infarction without residual deficits: Secondary | ICD-10-CM | POA: Diagnosis not present

## 2017-07-19 DIAGNOSIS — Z794 Long term (current) use of insulin: Secondary | ICD-10-CM | POA: Diagnosis not present

## 2017-07-19 DIAGNOSIS — I110 Hypertensive heart disease with heart failure: Secondary | ICD-10-CM | POA: Diagnosis not present

## 2017-07-19 MED ORDER — LIDOCAINE 5 % EX PTCH: 1.0000 | MEDICATED_PATCH | CUTANEOUS | 0 refills | Status: DC

## 2017-07-19 MED ORDER — LIDOCAINE 5 % EX PTCH
1.0000 | MEDICATED_PATCH | CUTANEOUS | Status: DC
  Administered 2017-07-19: 1 via TRANSDERMAL
  Filled 2017-07-19: qty 1

## 2017-07-19 MED ORDER — ACETAMINOPHEN 500 MG PO TABS
1000.0000 mg | ORAL_TABLET | Freq: Once | ORAL | Status: AC
  Administered 2017-07-19: 1000 mg via ORAL
  Filled 2017-07-19: qty 2

## 2017-07-19 NOTE — Discharge Instructions (Signed)
Your x-ray was reassuring.  This is likely sciatic nerve pain.  Use the lidocaine patch as discussed.  May take Tylenol for pain.  Follow-up with your primary care doctor and return to the ED if you develop any worsening symptoms including fevers, loss of bowel or bladder, inability to urinate, urinary symptoms, fevers or for any other reason.

## 2017-07-19 NOTE — ED Notes (Signed)
Pt stable, ambulatory, states understanding of discharge instructions 

## 2017-07-19 NOTE — ED Provider Notes (Signed)
[facility] Provider Note   CSN: [id number] Arrival date & time: 07/19/17  1122     History   Chief Complaint Chief Complaint  Patient presents with  . Back Pain    HPI Leonard [name] is a 84 y.o. male.  HPI 84 yo male pmh sig for htn, prostate cancer, DDD, CAD, chronic back pain, that presents to the ED for evaluation of acute on chronic low back pain. Pt states that he got up this morning and went to to breakfast. States that when he returned he started to have pain more than usual in his right lower back that radiated to the right buttocks. No known trauma. States this feels like his usual back pain but it has not resolved. He did not take anything for his pain. Sitting, bending and walking makes the pain worse. Does have a history of prostate cancer. Pt denies any ha, night sweats, hx of ivdu, loss or bowel or bladder, urinary retention, saddle paresthesias, lower extremity paresthesias.  Patient uses a walker at baseline.  Has been able to ambulate today.  Denies any associated abdominal pain, nausea, emesis, chest pain, shortness of breath, lightheadedness or dizziness.  Patient has been able to urinate today. Pt family does say he recently started to use a new walker this is heavier then his usual one. Denies any rashes.  Pt denies any fever, chill, ha, vision changes, lightheadedness, dizziness, congestion, neck pain, cp, sob, cough, abd pain, n/v/d, urinary symptoms, change in bowel habits, melena, hematochezia, lower extremity paresthesias.   Past Medical History:  Diagnosis Date  . Bladder tumor    hematuria  . Carotid stenosis, bilateral per doppler 08-20-09   mild  . Coronary artery disease    A. S/P CABG w/ Redo CABG in 1997;  b. 2005 Cath: LIMA->LAD ok, VG->OM ok, VG->RPDA->RPLV ok. Severe native 3VD.  Marland Kitchen DDD (degenerative disc disease), lumbar    s/p fusion 4-5  . Diabetes mellitus    oral med and insulin  . Diabetic  peripheral neuropathy (HCC)    feet  . Diastolic dysfunction    a. 05/2011 Echo: EF 55%, Gr1 DD, mild to mod MR, mildly to mod dil LA, mild TR.  . Glaucoma bilateral    eye drop rx  . History of hiatal hernia    denies reflux  . HOH (hard of hearing) no hearing aids  . Hypertension   . Mass of skin of right shoulder 03/12/2017  . Nocturia   . Prostate cancer [facility]) 1996   s/p external radiation  . Renal insufficiency hx  . Syncope 06/06/2011   In context of dehydration and bradycardia.  . Thrombocytopenia ([place]) 03/12/2017    Patient Active Problem List   Diagnosis Date Noted  . Thrombocytopenia ([place]) 03/12/2017  . Mass of skin of right shoulder 03/12/2017  . Glaucoma 03/11/2017  . Hiatal hernia 03/11/2017  . Chest pain 03/11/2017  . Left leg weakness 06/29/2016  . Degenerative arthritis of left knee 05/25/2016  . Left knee pain 03/24/2016  . History of prostate cancer 06/12/2014  . Chronic renal insufficiency, stage III (moderate) ([place]) 06/12/2014  . Non-compliant behavior 10/18/2013  . Abnormal computed tomography of pancreas or bile duct 09/21/2011  . Low back pain with sciatica 07/07/2010  . Uncontrolled type 2 diabetes with renal manifestation ([place]) 03/06/2008    Class: Chronic  . Hyperlipidemia 03/06/2008  . Hx of completed stroke 03/06/2008  . Diabetes ([place]) 07/26/2007  . Essential  hypertension 07/26/2007    Class: Chronic  . CAROTID BRUIT 07/26/2007  . Coronary atherosclerosis 07/04/2006    Class: Chronic  . OSTEOPOROSIS 07/04/2006    Past Surgical History:  Procedure Laterality Date  . CARDIAC CATHETERIZATION  last cath 2005   per dr Olevia Perches- w/ chart (grafts patent)-  results w/ chart  . CATARACT EXTRACTION W/ INTRAOCULAR LENS  IMPLANT, BILATERAL    . CORONARY ARTERY BYPASS GRAFT  1984   4 vessels  . CORONARY ARTERY BYPASS GRAFT  1997- redo   3 vessels  . LIPOMA EXCISION  1970's   from back  . LUMBAR FUSION  01/2004   L 4-5  . TRANSURETHRAL RESECTION  OF BLADDER TUMOR  04/16/2011   Procedure: TRANSURETHRAL RESECTION OF BLADDER TUMOR (TURBT);  Surgeon: Claybon Jabs;  Location: [facility];  Service: Urology;  Laterality: N/A;  . UPPER GASTROINTESTINAL ENDOSCOPY     dx hiatal hernia       Home Medications    Prior to Admission medications   Medication Sig Start Date End Date Taking? Authorizing Provider  amLODipine (NORVASC) 10 MG tablet Take 1 tablet (10 mg total) by mouth daily. 03/13/17   Samuella Cota, MD  amLODipine (NORVASC) 10 MG tablet TAKE 1 TABLET BY MOUTH EVERY DAY 04/19/17   Hoyt Koch, MD  aspirin 81 MG tablet Take 81 mg by mouth daily.     [provider]  atorvastatin (LIPITOR) 40 MG tablet Take 40 mg by mouth daily.    [provider]  carvedilol (COREG) 3.125 MG tablet Take 3.125 mg by mouth 2 (two) times daily. 03/08/17   [provider]  diclofenac sodium (VOLTAREN) 1 % GEL Apply 2 g topically 2 (two) times daily as needed. 02/04/17   Rosemarie Ax, MD  fish oil-omega-3 fatty acids 1000 MG capsule Take 1 g by mouth 2 (two) times daily.     [provider]  gabapentin (NEURONTIN) 600 MG tablet Take 600 mg by mouth 3 (three) times daily.      [provider]  insulin glargine (LANTUS) 100 UNIT/ML injection Inject 8 Units into the skin 2 (two) times daily.    [provider]  Insulin Pen Needle 32G X 4 MM MISC As directed per insulin dose and sliding scale 02/04/17   Rosemarie Ax, MD  isosorbide dinitrate (ISORDIL) 10 MG tablet Take 1 tablet (10 mg total) by mouth 3 (three) times daily. 03/13/17   Samuella Cota, MD  latanoprost (XALATAN) 0.005 % ophthalmic solution Place 1 drop into both eyes at bedtime.    [provider]  losartan (COZAAR) 100 MG tablet Take 1 tablet (100 mg total) by mouth daily. Overdue for annual appt w/labs must see MD for refills 11/17/16   Hoyt Koch, MD  Tamsulosin HCl (FLOMAX) 0.4 MG  CAPS Take 0.4 mg by mouth daily.    [provider]    Family History Family History  Problem Relation Age of Onset  . Diabetes Sister   . Cancer Neg Hx   . Heart disease Neg Hx   . Stroke Neg Hx     Social History Social History   Tobacco Use  . Smoking status: Never Smoker  . Smokeless tobacco: Never Used  Substance Use Topics  . Alcohol use: No  . Drug use: No     Allergies   Codeine; Microzide [hydrochlorothiazide]; and Ace inhibitors   Review of Systems Review of Systems  Constitutional: Negative for chills and fever.  HENT: Negative for congestion and sore throat.   Eyes: Negative for visual disturbance.  Respiratory: Negative for cough and shortness of breath.   Cardiovascular: Negative for chest pain.  Gastrointestinal: Negative for abdominal pain, diarrhea, nausea and vomiting.  Genitourinary: Negative for dysuria, flank pain, frequency, hematuria, scrotal swelling, testicular pain and urgency.  Musculoskeletal: Positive for back pain. Negative for arthralgias, gait problem and myalgias.  Skin: Negative for rash.  Neurological: Negative for dizziness, syncope, weakness, light-headedness, numbness and headaches.  Psychiatric/Behavioral: Negative for sleep disturbance. The patient is not nervous/anxious.      Physical Exam Updated Vital Signs BP (!) 169/82   Pulse 72   Temp (!) 97.5 F (36.4 C) (Oral)   Ht 5\' 6"  (1.676 m)   Wt 68 kg (150 lb)   SpO2 98%   BMI 24.21 kg/m   Physical Exam  Constitutional: He is oriented to person, place, and time. He appears well-developed and well-nourished.  Non-toxic appearance. No distress.  HENT:  Head: Normocephalic and atraumatic.  Mouth/Throat: Oropharynx is clear and moist.  Eyes: Conjunctivae are normal. Pupils are equal, round, and reactive to light. Right eye exhibits no discharge. Left eye exhibits no discharge.  Neck: Normal range of motion. Neck supple.  Cardiovascular: Normal rate, regular  rhythm, normal heart sounds and intact distal pulses. Exam reveals no gallop and no friction rub.  No murmur heard. Pulmonary/Chest: Effort normal and breath sounds normal. No respiratory distress. He exhibits no tenderness.  Abdominal: Soft. Bowel sounds are normal. He exhibits no distension and no pulsatile midline mass. There is no tenderness. There is no rebound and no guarding.  Musculoskeletal: Normal range of motion. He exhibits no tenderness.  Full range of motion of all joints of the lower extremity.  DP pulses are 2+ bilaterally.  Sensation intact.  Cap refill normal.  No midline T spine or L spine tenderness. No deformities or step offs noted. Full ROM. Pelvis is stable.  Patient does have right-sided paraspinal tenderness to palpation that radiates to his right buttocks.  Negative straight leg raise test.  Lymphadenopathy:    He has no cervical adenopathy.  Neurological: He is alert and oriented to person, place, and time.  Strength 5 out of 5 in lower extremities.  Patellar reflexes are normal.    Skin: Skin is warm and dry. Capillary refill takes less than 2 seconds. No rash noted.  Psychiatric: His behavior is normal. Judgment and thought content normal.  Nursing note and vitals reviewed.    ED Treatments / Results  Labs (all labs ordered are listed, but only abnormal results are displayed) Labs Reviewed - No data to display  EKG  EKG Interpretation None       Radiology Dg Lumbar Spine Complete  Result Date: 07/19/2017 CLINICAL DATA:  Low back pain, sciatic nerve pain EXAM: LUMBAR SPINE - COMPLETE 4+ VIEW COMPARISON:  CT abdomen 05/22/2017 FINDINGS: There are 5 nonrib bearing lumbar-type vertebral bodies. There is a mild chronic L1 vertebral body compression fracture. The remainder the vertebral body heights are maintained. There is stable grade 2 anterolisthesis of L5 on S1. There is no spondylolysis. There is no acute fracture. There is posterior lumbar interbody  fusion from L4 through S1 with bilateral pedicle screws at each level without hardware failure or complication. Solid osseous bridging across the disc spaces. The SI joints are unremarkable. There is abdominal aortic atherosclerosis. IMPRESSION: 1. Posterior lumbar interbody fusion from L4 through S1  without hardware failure or complication. 2.  No acute osseous injury of the lumbar spine. Electronically Signed   By: Kathreen Devoid   On: 07/19/2017 13:32    Procedures Procedures (including critical care time)  Medications Ordered in ED Medications - No data to display   Initial Impression / Assessment and Plan / ED Course  I have reviewed the triage vital signs and the nursing notes.  Pertinent labs & imaging results that were available during my care of the patient were reviewed by me and considered in my medical decision making (see chart for details).     Patient with acute on chronic back pain.  History of chronic low back pain with sciatic nerve pain.  Patient not taking for pain today.  Denies any new injury.  Denies any red flag symptoms.  X-ray showed no acute abnormalities.  No neurological deficits and normal neuro exam.  Patient can walk but states is painful.  No loss of bowel or bladder control.  No concern for cauda equina.  No fever, night sweats, weight loss, IVDU.  No rashes noted.  Patient given lidocaine and Tylenol.  Has appointment with primary care doctor today.  RICE protocol and pain medicine indicated and discussed with patient.   Pt is hemodynamically stable, in NAD, & able to ambulate in the ED. Evaluation does not show pathology that would require ongoing emergent intervention or inpatient treatment. I explained the diagnosis to the patient. Pain has been managed & has no complaints prior to dc. Pt is comfortable with above plan and is stable for discharge at this time. All questions were answered prior to disposition. Strict return precautions for f/u to the ED were  discussed. Encouraged follow up with PCP.    Final Clinical Impressions(s) / ED Diagnoses   Final diagnoses:  Chronic right-sided low back pain with right-sided sciatica    ED Discharge Orders        Ordered    lidocaine (LIDODERM) 5 %  Every 24 hours     07/19/17 1351       Doristine Devoid, PA-C 07/19/17 1357    Blanchie Dessert, MD 07/19/17 [phone]

## 2017-07-19 NOTE — ED Triage Notes (Signed)
Pt arrived via [facility] c/o mid to low back pain that he woke up with this morning.  Pt from [facility] and walks with a walker at facility.  Denies any injury, radiation, spasms at this time.  Pt walked with EMS at facility.

## 2017-07-19 NOTE — ED Notes (Signed)
Patient transported to X-ray 

## 2019-06-08 DIAGNOSIS — U071 COVID-19: Secondary | ICD-10-CM

## 2019-06-08 DIAGNOSIS — J1282 Pneumonia due to coronavirus disease 2019: Secondary | ICD-10-CM

## 2019-06-08 HISTORY — DX: COVID-19: U07.1

## 2019-06-08 HISTORY — DX: Pneumonia due to coronavirus disease 2019: J12.82

## 2019-06-26 ENCOUNTER — Other Ambulatory Visit: Payer: Self-pay

## 2019-06-26 ENCOUNTER — Encounter (HOSPITAL_COMMUNITY): Payer: Self-pay

## 2019-06-26 ENCOUNTER — Inpatient Hospital Stay (HOSPITAL_COMMUNITY)
Admission: EM | Admit: 2019-06-26 | Discharge: 2019-06-30 | DRG: 177 | Disposition: A | Discharge status: Home Health | Payer: Medicare Other | Source: Skilled Nursing Facility | Attending: Internal Medicine | Admitting: Internal Medicine

## 2019-06-26 ENCOUNTER — Emergency Department (HOSPITAL_COMMUNITY): Payer: Medicare Other

## 2019-06-26 DIAGNOSIS — J1282 Pneumonia due to coronavirus disease 2019: Secondary | ICD-10-CM | POA: Diagnosis present

## 2019-06-26 DIAGNOSIS — E119 Type 2 diabetes mellitus without complications: Secondary | ICD-10-CM

## 2019-06-26 DIAGNOSIS — Z8546 Personal history of malignant neoplasm of prostate: Secondary | ICD-10-CM | POA: Diagnosis not present

## 2019-06-26 DIAGNOSIS — Z79899 Other long term (current) drug therapy: Secondary | ICD-10-CM

## 2019-06-26 DIAGNOSIS — Z66 Do not resuscitate: Secondary | ICD-10-CM | POA: Diagnosis present

## 2019-06-26 DIAGNOSIS — E1142 Type 2 diabetes mellitus with diabetic polyneuropathy: Secondary | ICD-10-CM | POA: Diagnosis present

## 2019-06-26 DIAGNOSIS — N2889 Other specified disorders of kidney and ureter: Secondary | ICD-10-CM | POA: Diagnosis present

## 2019-06-26 DIAGNOSIS — Z923 Personal history of irradiation: Secondary | ICD-10-CM | POA: Diagnosis not present

## 2019-06-26 DIAGNOSIS — R531 Weakness: Secondary | ICD-10-CM

## 2019-06-26 DIAGNOSIS — I130 Hypertensive heart and chronic kidney disease with heart failure and stage 1 through stage 4 chronic kidney disease, or unspecified chronic kidney disease: Secondary | ICD-10-CM | POA: Diagnosis present

## 2019-06-26 DIAGNOSIS — E860 Dehydration: Secondary | ICD-10-CM | POA: Diagnosis present

## 2019-06-26 DIAGNOSIS — I5032 Chronic diastolic (congestive) heart failure: Secondary | ICD-10-CM | POA: Diagnosis present

## 2019-06-26 DIAGNOSIS — Z833 Family history of diabetes mellitus: Secondary | ICD-10-CM | POA: Diagnosis not present

## 2019-06-26 DIAGNOSIS — U071 COVID-19: Principal | ICD-10-CM | POA: Diagnosis present

## 2019-06-26 DIAGNOSIS — N179 Acute kidney failure, unspecified: Secondary | ICD-10-CM

## 2019-06-26 DIAGNOSIS — Z794 Long term (current) use of insulin: Secondary | ICD-10-CM

## 2019-06-26 DIAGNOSIS — N183 Chronic kidney disease, stage 3 unspecified: Secondary | ICD-10-CM | POA: Diagnosis present

## 2019-06-26 DIAGNOSIS — Z951 Presence of aortocoronary bypass graft: Secondary | ICD-10-CM | POA: Diagnosis not present

## 2019-06-26 DIAGNOSIS — Z981 Arthrodesis status: Secondary | ICD-10-CM

## 2019-06-26 DIAGNOSIS — Z7982 Long term (current) use of aspirin: Secondary | ICD-10-CM | POA: Diagnosis not present

## 2019-06-26 DIAGNOSIS — E785 Hyperlipidemia, unspecified: Secondary | ICD-10-CM | POA: Diagnosis present

## 2019-06-26 DIAGNOSIS — I2510 Atherosclerotic heart disease of native coronary artery without angina pectoris: Secondary | ICD-10-CM | POA: Diagnosis present

## 2019-06-26 DIAGNOSIS — E1122 Type 2 diabetes mellitus with diabetic chronic kidney disease: Secondary | ICD-10-CM | POA: Diagnosis present

## 2019-06-26 DIAGNOSIS — I10 Essential (primary) hypertension: Secondary | ICD-10-CM | POA: Diagnosis present

## 2019-06-26 DIAGNOSIS — G9349 Other encephalopathy: Secondary | ICD-10-CM | POA: Diagnosis not present

## 2019-06-26 LAB — CBC WITH DIFFERENTIAL/PLATELET
Abs Immature Granulocytes: 0.01 10*3/uL (ref 0.00–0.07)
Basophils Absolute: 0 10*3/uL (ref 0.0–0.1)
Basophils Relative: 0 %
Eosinophils Absolute: 0 10*3/uL (ref 0.0–0.5)
Eosinophils Relative: 0 %
HCT: 45.3 % (ref 39.0–52.0)
Hemoglobin: 14.6 g/dL (ref 13.0–17.0)
Immature Granulocytes: 0 %
Lymphocytes Relative: 35 %
Lymphs Abs: 1 10*3/uL (ref 0.7–4.0)
MCH: 27.9 pg (ref 26.0–34.0)
MCHC: 32.2 g/dL (ref 30.0–36.0)
MCV: 86.5 fL (ref 80.0–100.0)
Monocytes Absolute: 0.2 10*3/uL (ref 0.1–1.0)
Monocytes Relative: 8 %
Neutro Abs: 1.6 10*3/uL — ABNORMAL LOW (ref 1.7–7.7)
Neutrophils Relative %: 57 %
Platelets: 100 10*3/uL — ABNORMAL LOW (ref 150–400)
RBC: 5.24 MIL/uL (ref 4.22–5.81)
RDW: 14.5 % (ref 11.5–15.5)
WBC: 2.8 10*3/uL — ABNORMAL LOW (ref 4.0–10.5)
nRBC: 0 % (ref 0.0–0.2)

## 2019-06-26 LAB — COMPREHENSIVE METABOLIC PANEL
ALT: 13 U/L (ref 0–44)
AST: 21 U/L (ref 15–41)
Albumin: 3.3 g/dL — ABNORMAL LOW (ref 3.5–5.0)
Alkaline Phosphatase: 90 U/L (ref 38–126)
Anion gap: 11 (ref 5–15)
BUN: 21 mg/dL (ref 8–23)
CO2: 21 mmol/L — ABNORMAL LOW (ref 22–32)
Calcium: 7.9 mg/dL — ABNORMAL LOW (ref 8.9–10.3)
Chloride: 102 mmol/L (ref 98–111)
Creatinine, Ser: 1.58 mg/dL — ABNORMAL HIGH (ref 0.61–1.24)
GFR calc Af Amer: 42 mL/min — ABNORMAL LOW (ref >60)
GFR calc non Af Amer: 36 mL/min — ABNORMAL LOW (ref >60)
Glucose, Bld: 212 mg/dL — ABNORMAL HIGH (ref 70–99)
Potassium: 3.8 mmol/L (ref 3.5–5.1)
Sodium: 134 mmol/L — ABNORMAL LOW (ref 135–145)
Total Bilirubin: 1 mg/dL (ref 0.3–1.2)
Total Protein: 6.8 g/dL (ref 6.5–8.1)

## 2019-06-26 LAB — D-DIMER, QUANTITATIVE: D-Dimer, Quant: 5.62 ug/mL-FEU — ABNORMAL HIGH (ref 0.00–0.50)

## 2019-06-26 LAB — LACTATE DEHYDROGENASE: LDH: 217 U/L — ABNORMAL HIGH (ref 98–192)

## 2019-06-26 LAB — PROCALCITONIN: Procalcitonin: <0.1 ng/mL

## 2019-06-26 LAB — FERRITIN: Ferritin: 190 ng/mL (ref 24–336)

## 2019-06-26 LAB — LACTIC ACID, PLASMA
Lactic Acid, Venous: 2 mmol/L (ref 0.5–1.9)
Lactic Acid, Venous: 1.1 mmol/L (ref 0.5–1.9)

## 2019-06-26 LAB — FIBRINOGEN: Fibrinogen: 462 mg/dL (ref 210–475)

## 2019-06-26 LAB — C-REACTIVE PROTEIN: CRP: 1.4 mg/dL — ABNORMAL HIGH (ref <1.0)

## 2019-06-26 LAB — CBG MONITORING, ED
Glucose-Capillary: 197 mg/dL — ABNORMAL HIGH (ref 70–99)
Glucose-Capillary: 261 mg/dL — ABNORMAL HIGH (ref 70–99)

## 2019-06-26 LAB — GLUCOSE, CAPILLARY: Glucose-Capillary: 260 mg/dL — ABNORMAL HIGH (ref 70–99)

## 2019-06-26 LAB — POC SARS CORONAVIRUS 2 AG -  ED: SARS Coronavirus 2 Ag: POSITIVE — AB

## 2019-06-26 LAB — TRIGLYCERIDES: Triglycerides: 98 mg/dL (ref <150)

## 2019-06-26 MED ORDER — GUAIFENESIN-DM 100-10 MG/5ML PO SYRP
10.0000 mL | ORAL_SOLUTION | ORAL | Status: DC | PRN
  Administered 2019-06-28: 10 mL via ORAL
  Filled 2019-06-26: qty 10

## 2019-06-26 MED ORDER — SODIUM CHLORIDE 0.9% FLUSH
3.0000 mL | Freq: Two times a day (BID) | INTRAVENOUS | Status: DC
  Administered 2019-06-26 – 2019-06-27 (×3): 3 mL via INTRAVENOUS

## 2019-06-26 MED ORDER — HEPARIN SODIUM (PORCINE) 5000 UNIT/ML IJ SOLN
5000.0000 [IU] | Freq: Three times a day (TID) | INTRAMUSCULAR | Status: DC
  Administered 2019-06-26 – 2019-06-30 (×11): 5000 [IU] via SUBCUTANEOUS
  Filled 2019-06-26 (×11): qty 1

## 2019-06-26 MED ORDER — DEXAMETHASONE 6 MG PO TABS
6.0000 mg | ORAL_TABLET | ORAL | Status: DC
  Administered 2019-06-26: 6 mg via ORAL
  Filled 2019-06-26: qty 1

## 2019-06-26 MED ORDER — HYDROCOD POLST-CPM POLST ER 10-8 MG/5ML PO SUER: 5.0000 mL | Freq: Two times a day (BID) | ORAL | Status: DC | PRN

## 2019-06-26 MED ORDER — AMLODIPINE BESYLATE 10 MG PO TABS
10.0000 mg | ORAL_TABLET | Freq: Every day | ORAL | Status: DC
  Administered 2019-06-27 – 2019-06-30 (×4): 10 mg via ORAL
  Filled 2019-06-26 (×4): qty 1

## 2019-06-26 MED ORDER — LATANOPROST 0.005 % OP SOLN
1.0000 [drp] | Freq: Every day | OPHTHALMIC | Status: DC
  Administered 2019-06-28 – 2019-06-29 (×2): 1 [drp] via OPHTHALMIC
  Filled 2019-06-26 (×2): qty 2.5

## 2019-06-26 MED ORDER — ONDANSETRON HCL 4 MG PO TABS: 4.0000 mg | ORAL_TABLET | Freq: Four times a day (QID) | ORAL | Status: DC | PRN

## 2019-06-26 MED ORDER — POLYETHYLENE GLYCOL 3350 17 G PO PACK: 17.0000 g | PACK | Freq: Every day | ORAL | Status: DC | PRN

## 2019-06-26 MED ORDER — ENOXAPARIN SODIUM 40 MG/0.4ML ~~LOC~~ SOLN: 0.5000 mg/kg | Freq: Two times a day (BID) | SUBCUTANEOUS | Status: DC

## 2019-06-26 MED ORDER — INSULIN GLARGINE 100 UNIT/ML ~~LOC~~ SOLN
9.0000 [IU] | Freq: Every day | SUBCUTANEOUS | Status: DC
  Administered 2019-06-26 – 2019-06-27 (×2): 9 [IU] via SUBCUTANEOUS
  Filled 2019-06-26 (×2): qty 0.09

## 2019-06-26 MED ORDER — ISOSORBIDE DINITRATE 10 MG PO TABS
10.0000 mg | ORAL_TABLET | Freq: Three times a day (TID) | ORAL | Status: DC
  Administered 2019-06-26 – 2019-06-30 (×10): 10 mg via ORAL
  Filled 2019-06-26 (×17): qty 1

## 2019-06-26 MED ORDER — SODIUM CHLORIDE 0.9 % IV SOLN
100.0000 mg | Freq: Every day | INTRAVENOUS | Status: AC
  Administered 2019-06-27 – 2019-06-30 (×4): 100 mg via INTRAVENOUS
  Filled 2019-06-26 (×4): qty 20

## 2019-06-26 MED ORDER — OXYCODONE HCL 5 MG PO TABS: 5.0000 mg | ORAL_TABLET | ORAL | Status: DC | PRN

## 2019-06-26 MED ORDER — SODIUM CHLORIDE 0.9 % IV BOLUS
1000.0000 mL | Freq: Once | INTRAVENOUS | Status: AC
  Administered 2019-06-26: 1000 mL via INTRAVENOUS

## 2019-06-26 MED ORDER — ONDANSETRON HCL 4 MG/2ML IJ SOLN: 4.0000 mg | Freq: Four times a day (QID) | INTRAMUSCULAR | Status: DC | PRN

## 2019-06-26 MED ORDER — TAMSULOSIN HCL 0.4 MG PO CAPS
0.4000 mg | ORAL_CAPSULE | Freq: Every day | ORAL | Status: DC
  Administered 2019-06-27 – 2019-06-30 (×4): 0.4 mg via ORAL
  Filled 2019-06-26 (×4): qty 1

## 2019-06-26 MED ORDER — GABAPENTIN 300 MG PO CAPS
300.0000 mg | ORAL_CAPSULE | Freq: Two times a day (BID) | ORAL | Status: DC
  Administered 2019-06-26 – 2019-06-30 (×8): 300 mg via ORAL
  Filled 2019-06-26 (×8): qty 1

## 2019-06-26 MED ORDER — ALBUTEROL SULFATE HFA 108 (90 BASE) MCG/ACT IN AERS
2.0000 | INHALATION_SPRAY | RESPIRATORY_TRACT | Status: DC | PRN
  Filled 2019-06-26: qty 6.7

## 2019-06-26 MED ORDER — MIRABEGRON ER 25 MG PO TB24
25.0000 mg | ORAL_TABLET | Freq: Every day | ORAL | Status: DC
  Administered 2019-06-27 – 2019-06-30 (×4): 25 mg via ORAL
  Filled 2019-06-26 (×4): qty 1

## 2019-06-26 MED ORDER — SODIUM CHLORIDE 0.9% FLUSH: 3.0000 mL | INTRAVENOUS | Status: DC | PRN

## 2019-06-26 MED ORDER — INSULIN ASPART 100 UNIT/ML ~~LOC~~ SOLN
0.0000 [IU] | Freq: Three times a day (TID) | SUBCUTANEOUS | Status: DC
  Administered 2019-06-26: 19:00:00 5 [IU] via SUBCUTANEOUS
  Administered 2019-06-27: 7 [IU] via SUBCUTANEOUS
  Filled 2019-06-26: qty 0.09

## 2019-06-26 MED ORDER — GABAPENTIN 300 MG PO CAPS: 600.0000 mg | ORAL_CAPSULE | Freq: Three times a day (TID) | ORAL | Status: DC

## 2019-06-26 MED ORDER — SODIUM CHLORIDE 0.9 % IV SOLN: 250.0000 mL | INTRAVENOUS | Status: DC | PRN

## 2019-06-26 MED ORDER — FLEET ENEMA 7-19 GM/118ML RE ENEM
1.0000 | ENEMA | Freq: Once | RECTAL | Status: DC | PRN
  Filled 2019-06-26: qty 1

## 2019-06-26 MED ORDER — SODIUM CHLORIDE 0.9 % IV SOLN
200.0000 mg | Freq: Once | INTRAVENOUS | Status: AC
  Administered 2019-06-26: 200 mg via INTRAVENOUS
  Filled 2019-06-26: qty 200

## 2019-06-26 MED ORDER — BISACODYL 5 MG PO TBEC: 5.0000 mg | DELAYED_RELEASE_TABLET | Freq: Every day | ORAL | Status: DC | PRN

## 2019-06-26 MED ORDER — ACETAMINOPHEN 325 MG PO TABS
650.0000 mg | ORAL_TABLET | Freq: Four times a day (QID) | ORAL | Status: DC | PRN
  Administered 2019-06-28: 22:00:00 650 mg via ORAL
  Filled 2019-06-26: qty 2

## 2019-06-26 NOTE — H&P (Signed)
History and Physical    Leonard [name] MWN:[id number] DOB: 1920-12-01 DOA: 06/26/2019  PCP: Hoyt Koch, MD Consultants:  Johnsie Cancel - cardiology Patient coming from: [facility]; NOK:  Daughter, Irene Pap, [phone]  Chief Complaint: weakness, COVID-19  HPI: Leonard [name] is a 86 y.o. male with medical history significant of remote prostate CA s/p radiation therapy; HTN; chronic diastolic CHF; CAD s/p CABG; and DM presenting with weakness in the setting of known COVID-19 infection.  The patient is pleasant and conversant, in NAD.  He reports that "they" sent him to the ER.  Denies SOB.  Acknowledges fatigue.  He is isolated at [facility] because he is COVID +.  He is usually able to get up and take care of himself.  He was unable to get up.  He is not eating much.  He has had intermittent cognitive issues - thinking his wife is nearby even though she died last 09-Feb-2023.  Having to be contained in his room has been a problem, they have noticed cognitive changes, worried about depression, not eating.     ED Course:  COVID + 1/13, vaccinated 2 days before.  Slightly weaker than usual.  No significant respiratory or GI symptoms.  Usually active, today too weak to get out of bed.  Looks clinically dehydrated.  AKI, given fluid bolus.  Review of Systems: As per HPI; otherwise review of systems reviewed and negative.    Past Medical History:  Diagnosis Date  . Bladder tumor    hematuria  . Carotid stenosis, bilateral per doppler 08-20-09   mild  . Coronary artery disease    A. S/P CABG w/ Redo CABG in 1997;  b. 2005 Cath: LIMA->LAD ok, VG->OM ok, VG->RPDA->RPLV ok. Severe native 3VD.  Marland Kitchen DDD (degenerative disc disease), lumbar    s/p fusion 4-5  . Diabetes mellitus    oral med and insulin  . Diabetic peripheral neuropathy (HCC)    feet  . Diastolic dysfunction    a. 05/2011 Echo: EF 55%, Gr1 DD, mild to mod MR, mildly to mod dil LA, mild TR.  . Glaucoma  bilateral    eye drop rx  . History of hiatal hernia    denies reflux  . HOH (hard of hearing) no hearing aids  . Hypertension   . Mass of skin of right shoulder 03/12/2017  . Nocturia   . Prostate cancer [facility]) 1996   s/p external radiation  . Renal insufficiency hx  . Syncope 06/06/2011   In context of dehydration and bradycardia.  . Thrombocytopenia ([place]) 03/12/2017    Past Surgical History:  Procedure Laterality Date  . CARDIAC CATHETERIZATION  last cath 2005   per dr Olevia Perches- w/ chart (grafts patent)-  results w/ chart  . CATARACT EXTRACTION W/ INTRAOCULAR LENS  IMPLANT, BILATERAL    . CORONARY ARTERY BYPASS GRAFT  1984   4 vessels  . CORONARY ARTERY BYPASS GRAFT  1997- redo   3 vessels  . LIPOMA EXCISION  1970's   from back  . LUMBAR FUSION  02/09/2004   L 4-5  . TRANSURETHRAL RESECTION OF BLADDER TUMOR  04/16/2011   Procedure: TRANSURETHRAL RESECTION OF BLADDER TUMOR (TURBT);  Surgeon: Claybon Jabs;  Location: [place];  Service: Urology;  Laterality: N/A;  . UPPER GASTROINTESTINAL ENDOSCOPY     dx hiatal hernia    Social History   Socioeconomic History  . Marital status: Married    Spouse name: Not on file  .  Number of children: Not on file  . Years of education: Not on file  . Highest education level: Not on file  Occupational History  . Occupation: retired  Tobacco Use  . Smoking status: Never Smoker  . Smokeless tobacco: Never Used  Substance and Sexual Activity  . Alcohol use: No  . Drug use: No  . Sexual activity: Yes    Birth control/protection: None  Other Topics Concern  . Not on file  Social History Narrative  . Not on file   Social Determinants of Health   Financial Resource Strain:   . Difficulty of Paying Living Expenses: Not on file  Food Insecurity:   . Worried About Charity fundraiser in the Last Year: Not on file  . Ran Out of Food in the Last Year: Not on file  Transportation Needs:   . Lack of Transportation  (Medical): Not on file  . Lack of Transportation (Non-Medical): Not on file  Physical Activity:   . Days of Exercise per Week: Not on file  . Minutes of Exercise per Session: Not on file  Stress:   . Feeling of Stress : Not on file  Social Connections:   . Frequency of Communication with Friends and Family: Not on file  . Frequency of Social Gatherings with Friends and Family: Not on file  . Attends Religious Services: Not on file  . Active Member of Clubs or Organizations: Not on file  . Attends Archivist Meetings: Not on file  . Marital Status: Not on file  Intimate Partner Violence:   . Fear of Current or Ex-Partner: Not on file  . Emotionally Abused: Not on file  . Physically Abused: Not on file  . Sexually Abused: Not on file    Allergies  Allergen Reactions  . Codeine Nausea And Vomiting and Other (See Comments)    hallucinations  . Microzide [Hydrochlorothiazide] Other (See Comments)    Acute gout on LosartanHCT 12/2014  . Ace Inhibitors Other (See Comments)     cough    Family History  Problem Relation Age of Onset  . Diabetes Sister   . Cancer Neg Hx   . Heart disease Neg Hx   . Stroke Neg Hx     Prior to Admission medications   Medication Sig Start Date End Date Taking? Authorizing Provider  aspirin 81 MG tablet Take 81 mg by mouth daily.    Yes [provider]  amLODipine (NORVASC) 10 MG tablet Take 1 tablet (10 mg total) by mouth daily. 03/13/17   Samuella Cota, MD  amLODipine (NORVASC) 10 MG tablet TAKE 1 TABLET BY MOUTH EVERY DAY 04/19/17   Hoyt Koch, MD  atorvastatin (LIPITOR) 40 MG tablet Take 40 mg by mouth daily.    [provider]  carvedilol (COREG) 3.125 MG tablet Take 3.125 mg by mouth 2 (two) times daily. 03/08/17   [provider]  diclofenac sodium (VOLTAREN) 1 % GEL Apply 2 g topically 2 (two) times daily as needed. 02/04/17   Rosemarie Ax, MD  fish oil-omega-3 fatty acids 1000 MG capsule  Take 1 g by mouth 2 (two) times daily.     [provider]  gabapentin (NEURONTIN) 600 MG tablet Take 600 mg by mouth 3 (three) times daily.      [provider]  insulin glargine (LANTUS) 100 UNIT/ML injection Inject 8 Units into the skin 2 (two) times daily.    [provider]  Insulin Pen  Needle 32G X 4 MM MISC As directed per insulin dose and sliding scale 02/04/17   Rosemarie Ax, MD  isosorbide dinitrate (ISORDIL) 10 MG tablet Take 1 tablet (10 mg total) by mouth 3 (three) times daily. 03/13/17   Samuella Cota, MD  latanoprost (XALATAN) 0.005 % ophthalmic solution Place 1 drop into both eyes at bedtime.    [provider]  lidocaine (LIDODERM) 5 % Place 1 patch onto the skin daily. Remove & Discard patch within 12 hours or as directed by MD 07/19/17   Doristine Devoid, PA-C  losartan (COZAAR) 100 MG tablet Take 1 tablet (100 mg total) by mouth daily. Overdue for annual appt w/labs must see MD for refills 11/17/16   Hoyt Koch, MD  Tamsulosin HCl (FLOMAX) 0.4 MG CAPS Take 0.4 mg by mouth daily.    [provider]    Physical Exam: Vitals:   06/26/19 1100 06/26/19 1115 06/26/19 1130 06/26/19 1200  BP: (!) 162/83  (!) 155/90 (!) 144/80  Pulse:  81 89 72  Resp: 13 (!) 25 (!) 25 (!) 25  Temp:      TempSrc:      SpO2:  95% 95% 96%  Weight:      Height:         . General:  Appears calm and comfortable and is NAD . Eyes:  PERRL, EOMI, normal lids, iris . ENT:  grossly normal hearing, lips & tongue, mmm . Neck:  no LAD, masses or thyromegaly; no carotid bruits . Cardiovascular:  RRR, no m/r/g. No LE edema.  Marland Kitchen Respiratory:   CTA bilaterally with no wheezes/rales/rhonchi.  Mildly increased respiratory effort. . Abdomen:  soft, NT, ND, NABS . Skin:  no rash or induration seen on limited exam . Musculoskeletal:  grossly normal tone BUE/BLE, good ROM, no bony abnormality. Marland Kitchen Psychiatric:  grossly normal mood and affect, speech  fluent and appropriate, AOx3 . Neurologic:  CN 2-12 grossly intact, moves all extremities in coordinated fashion    Radiological Exams on Admission: DG Chest Port 1 View  Result Date: 06/26/2019 CLINICAL DATA:  COVID.  History of fever and fatigue. EXAM: PORTABLE CHEST 1 VIEW COMPARISON:  05/22/2017. FINDINGS: Prior CABG. Cardiomegaly. Very mild pulmonary venous congestion cannot be excluded. Very mild bilateral interstitial prominence noted. Mild CHF and or pneumonitis cannot be excluded. No pleural effusion or pneumothorax. No acute bony abnormality identified. IMPRESSION: Prior CABG. Cardiomegaly. Very mild pulmonary venous congestion. Very mild bilateral interstitial prominence noted. Mild CHF and or pneumonitis cannot be excluded in this known COVID positive patient. Electronically Signed   By: Marcello Moores  Register   On: 06/26/2019 10:35    EKG: Independently reviewed.  NSR with rate 83; RBBB; nonspecific ST changes with no evidence of acute ischemia   Labs on Admission: I have personally reviewed the available labs and imaging studies at the time of the admission.  Pertinent labs:   Na++ 134 CO2 21 Glucose 212 BUN 21/Creatinine 1.58/GFR 42; 16/1.01/>60 in 2018 Calcium 7.9 LDH 217 Ferritin 190 CRP 1.4 Lactate 2.0 WBC 2.8; +neutropenia Platelets 100 D-dimer 5.62 Fibrinogen 462 BD COVID POSITIVE Blood cultures  Assessment/Plan Principal Problem:   COVID-19 virus detected Active Problems:   Diabetes (HCC)   Hyperlipidemia   Essential hypertension   History of prostate cancer   Chronic renal insufficiency, stage III (moderate)   Fatigue associated with COVID-19 infection -Patient with presenting with fatigue and decreased level of activity in the setting of COVID-19 infection -Mild  anorexia noted without the presence of other GI symptoms -He is not currently requiring O2 -Because there is a possible COVID contact in this patient with a new O2 requirement, will consider at  high risk for COVID at this time -COVID POSITIVE -The patient has comorbidities which may increase the risk for ARDS/MODS including: age, HTN, DM -Pertinent labs concerning for COVID include leukopenia; increased BUN/Creatinine;  markedly elevated D-dimer (>>1); increased LDH -CXR with very mild possible multifocal opacities which may be c/w COVID -Will not treat with broad-spectrum antibiotics given lack of respiratory symptoms; procalcitonin is pending -Will admit to [facility] for further evaluation, close monitoring, and treatment -Monitor on telemetry x at least 24 hours -At this time, will attempt to avoid use of aerosolized medications and use HFAs instead -Will check daily labs including BMP with Mag, Phos; LFTs; CBC with differential; CRP; ferritin; fibrinogen; D-dimer  -Will order steroids (1 mg/kg divided BID) and Remdesivir (pharmacy consult) -If the patient shows clinical deterioration, consider transfer to ICU with PCCM consultation -Will attempt to maintain euvolemia to a net negative fluid status -Will ask the patient to maintain an awake prone position for 16+ hours a day, if possible, with a minimum of 2-3 hours at a time -With D-dimer >5, will use weight-based Lovenox prophylaxis at this time (0.5 mg/kg q12h) -Patient was seen wearing full PPE including: gown, gloves, head cover, N95, and face shield; donning and doffing was in compliance with current standards.  AKI on Stage 3 CKD -No recent creatinine available in Epic at this time, so really uncertain baseline -Current creatinine is 50% higher than in 2018 -Will follow -He was given IVF in the ER; for now will hold additional IVF to avoid volume overload in COVID-19 infection -Avoid nephrotoxic medications -Hold Cozaar  HTN -Continue Norvasc -Hold Cozaar  HLD -Hold omega 3 while inpatient due to limited inpatient utility  DM -Continue glargine -Will cover with sensitive-scale SSI, no AC coverage -Overall, the goal  for this patient would be avoidance of hypoglycemia and avoidance of severe hyperglycemia - but tight control is not needed at this time  H/o prostate CA -Remote history -Continue Mybetriq and Flomax  CAD -s/p CABG -No apparent current c/o CP -Hold ASA given Lovenox -Continue Isordil   DVT prophylaxis:  Lovenox  Code Status:  DNR - confirmed with patient Family Communication: None present; I spoke with the patient's daughter by telephone. Disposition Plan:  Home once clinically improved Consults called: None  Admission status: Admit - It is my clinical opinion that admission to INPATIENT is reasonable and necessary because of the expectation that this patient will require hospital care that crosses at least 2 midnights to treat this condition based on the medical complexity of the problems presented.  Given the aforementioned information, the predictability of an adverse outcome is felt to be significant.    Karmen Bongo MD [facility]   How to contact the [facility] Attending or Consulting provider [place] or covering provider during after hours [place], for this patient?  1. Check the care team in [facility] and look for a) attending/consulting TR[facility] provider listed and b) the [facility] team listed 2. Log into [email or web address] and use [place]'s universal password to access. If you do not have the password, please contact the hospital operator. 3. Locate the [facility] provider you are looking for under [facility] and page to a number that you can be directly reached. 4. If you still have difficulty reaching the provider, please  page the [facility] (Director on Call) for the Hospitalists listed on amion for assistance.   06/26/2019, 12:13 PM

## 2019-06-26 NOTE — ED Provider Notes (Signed)
[facility] DEPT Provider Note   CSN: [id number] Arrival date & time: 06/26/19  1700     History Chief Complaint  Patient presents with  . Fatigue    Covid+    Leonard [name] is a 86 y.o. male.  The history is provided by the patient.  Weakness Severity:  Mild Onset quality:  Gradual Timing:  Constant Progression:  Worsening Chronicity:  New Context: recent infection (COVID positive 1/13)   Relieved by:  Nothing Worsened by:  Nothing Associated symptoms: difficulty walking, lethargy and myalgias   Associated symptoms: no abdominal pain, no anorexia, no arthralgias, no chest pain, no cough, no dizziness, no dysuria, no fever, no headaches, no nausea, no seizures, no shortness of breath and no vomiting   Risk factors: coronary artery disease        Past Medical History:  Diagnosis Date  . Bladder tumor    hematuria  . Carotid stenosis, bilateral per doppler 08-20-09   mild  . Coronary artery disease    A. S/P CABG w/ Redo CABG in 1997;  b. 2005 Cath: LIMA->LAD ok, VG->OM ok, VG->RPDA->RPLV ok. Severe native 3VD.  Marland Kitchen DDD (degenerative disc disease), lumbar    s/p fusion 4-5  . Diabetes mellitus    oral med and insulin  . Diabetic peripheral neuropathy (HCC)    feet  . Diastolic dysfunction    a. 05/2011 Echo: EF 55%, Gr1 DD, mild to mod MR, mildly to mod dil LA, mild TR.  . Glaucoma bilateral    eye drop rx  . History of hiatal hernia    denies reflux  . HOH (hard of hearing) no hearing aids  . Hypertension   . Mass of skin of right shoulder 03/12/2017  . Nocturia   . Prostate cancer [facility]) 1996   s/p external radiation  . Renal insufficiency hx  . Syncope 06/06/2011   In context of dehydration and bradycardia.  . Thrombocytopenia ([place]) 03/12/2017    Patient Active Problem List   Diagnosis Date Noted  . Thrombocytopenia ([place]) 03/12/2017  . Mass of skin of right shoulder 03/12/2017  . Glaucoma 03/11/2017  . Hiatal hernia  03/11/2017  . Chest pain 03/11/2017  . Left leg weakness 06/29/2016  . Degenerative arthritis of left knee 05/25/2016  . Left knee pain 03/24/2016  . History of prostate cancer 06/12/2014  . Chronic renal insufficiency, stage III (moderate) ([place]) 06/12/2014  . Non-compliant behavior 10/18/2013  . Abnormal computed tomography of pancreas or bile duct 09/21/2011  . Low back pain with sciatica 07/07/2010  . Uncontrolled type 2 diabetes with renal manifestation ([place]) 03/06/2008    Class: Chronic  . Hyperlipidemia 03/06/2008  . Hx of completed stroke 03/06/2008  . Diabetes ([place]) 07/26/2007  . Essential hypertension 07/26/2007    Class: Chronic  . CAROTID BRUIT 07/26/2007  . Coronary atherosclerosis 07/04/2006    Class: Chronic  . OSTEOPOROSIS 07/04/2006    Past Surgical History:  Procedure Laterality Date  . CARDIAC CATHETERIZATION  last cath 2005   per dr Olevia Perches- w/ chart (grafts patent)-  results w/ chart  . CATARACT EXTRACTION W/ INTRAOCULAR LENS  IMPLANT, BILATERAL    . CORONARY ARTERY BYPASS GRAFT  1984   4 vessels  . CORONARY ARTERY BYPASS GRAFT  1997- redo   3 vessels  . LIPOMA EXCISION  1970's   from back  . LUMBAR FUSION  01/2004   L 4-5  . TRANSURETHRAL RESECTION OF BLADDER TUMOR  04/16/2011  Procedure: TRANSURETHRAL RESECTION OF BLADDER TUMOR (TURBT);  Surgeon: Claybon Jabs;  Location: [facility];  Service: Urology;  Laterality: N/A;  . UPPER GASTROINTESTINAL ENDOSCOPY     dx hiatal hernia       Family History  Problem Relation Age of Onset  . Diabetes Sister   . Cancer Neg Hx   . Heart disease Neg Hx   . Stroke Neg Hx     Social History   Tobacco Use  . Smoking status: Never Smoker  . Smokeless tobacco: Never Used  Substance Use Topics  . Alcohol use: No  . Drug use: No    Home Medications Prior to Admission medications   Medication Sig Start Date End Date Taking? Authorizing Provider  amLODipine (NORVASC) 10 MG tablet TAKE 1  TABLET BY MOUTH EVERY DAY Patient taking differently: Take 10 mg by mouth daily.  04/19/17  Yes Hoyt Koch, MD  aspirin 81 MG tablet Take 81 mg by mouth daily.    Yes [provider]  Cholecalciferol (VITAMIN D3) 125 MCG (5000 UT) CAPS Take 5,000 Units by mouth every 14 (fourteen) days.   Yes [provider]  diclofenac sodium (VOLTAREN) 1 % GEL Apply 2 g topically 2 (two) times daily as needed. Patient taking differently: Apply 2 g topically 2 (two) times daily. Joints of right finger 02/04/17  Yes Rosemarie Ax, MD  gabapentin (NEURONTIN) 600 MG tablet Take 600 mg by mouth 3 (three) times daily.     Yes [provider]  Insulin Glargine (BASAGLAR KWIKPEN) 100 UNIT/ML SOPN Inject 9 Units into the skin daily.   Yes [provider]  insulin lispro (HUMALOG) 100 UNIT/ML injection Inject 0-7 Units into the skin 3 (three) times daily before meals. 0-180 0 units 181-240 3 units 241-300 4 units 301-360 5 units 361-420 6 units 421+ 7 units   Yes [provider]  isosorbide dinitrate (ISORDIL) 10 MG tablet Take 1 tablet (10 mg total) by mouth 3 (three) times daily. 03/13/17  Yes Samuella Cota, MD  latanoprost (XALATAN) 0.005 % ophthalmic solution Place 1 drop into both eyes at bedtime.   Yes [provider]  losartan (COZAAR) 100 MG tablet Take 1 tablet (100 mg total) by mouth daily. Overdue for annual appt w/labs must see MD for refills 11/17/16  Yes Hoyt Koch, MD  meclizine (ANTIVERT) 12.5 MG tablet Take 12.5 mg by mouth 3 (three) times daily as needed for dizziness.   Yes [provider]  mirabegron ER (MYRBETRIQ) 25 MG TB24 tablet Take 25 mg by mouth daily.   Yes [provider]  Omega 3 1200 MG CAPS Take 1,200 mg by mouth 2 (two) times daily.   Yes [provider]  Tamsulosin HCl (FLOMAX) 0.4 MG CAPS Take 0.4 mg by mouth daily.   Yes [provider]  amLODipine (NORVASC) 10 MG  tablet Take 1 tablet (10 mg total) by mouth daily. Patient not taking: Reported on 06/26/2019 03/13/17   Samuella Cota, MD  Insulin Pen Needle 32G X 4 MM MISC As directed per insulin dose and sliding scale 02/04/17   Rosemarie Ax, MD  lidocaine (LIDODERM) 5 % Place 1 patch onto the skin daily. Remove & Discard patch within 12 hours or as directed by MD Patient not taking: Reported on 06/26/2019 07/19/17   Doristine Devoid, PA-C    Allergies    Codeine, Microzide [hydrochlorothiazide], and Ace inhibitors  Review of Systems  Review of Systems  Constitutional: Negative for chills and fever.  HENT: Negative for ear pain and sore throat.   Eyes: Negative for pain and visual disturbance.  Respiratory: Negative for cough and shortness of breath.   Cardiovascular: Negative for chest pain and palpitations.  Gastrointestinal: Negative for abdominal pain, anorexia, blood in stool, nausea and vomiting.  Genitourinary: Negative for dysuria and hematuria.  Musculoskeletal: Positive for myalgias. Negative for arthralgias and back pain.  Skin: Negative for color change and rash.  Neurological: Positive for weakness. Negative for dizziness, seizures, syncope, facial asymmetry, light-headedness, numbness and headaches.  All other systems reviewed and are negative.   Physical Exam Updated Vital Signs  ED Triage Vitals  Enc Vitals Group     BP 06/26/19 0946 (!) 158/84     Pulse Rate 06/26/19 0946 77     Resp 06/26/19 0946 (!) 24     Temp 06/26/19 0946 98.9 F (37.2 C)     Temp Source 06/26/19 0946 Oral     SpO2 06/26/19 0939 96 %     Weight 06/26/19 0946 138 lb (62.6 kg)     Height 06/26/19 0946 5\' 6"  (1.676 m)     Head Circumference --      Peak Flow --      Pain Score --      Pain Loc --      Pain Edu? --      Excl. in [place]? --     Physical Exam Vitals and nursing note reviewed.  Constitutional:      General: He is not in acute distress.    Appearance: He is well-developed. He  is not ill-appearing.  HENT:     Head: Normocephalic and atraumatic.     Nose: Nose normal.     Mouth/Throat:     Mouth: Mucous membranes are dry.  Eyes:     Extraocular Movements: Extraocular movements intact.     Conjunctiva/sclera: Conjunctivae normal.     Pupils: Pupils are equal, round, and reactive to light.  Cardiovascular:     Rate and Rhythm: Normal rate and regular rhythm.     Pulses: Normal pulses.     Heart sounds: Normal heart sounds. No murmur.  Pulmonary:     Effort: Pulmonary effort is normal. No respiratory distress.     Breath sounds: Normal breath sounds.  Abdominal:     Palpations: Abdomen is soft.     Tenderness: There is no abdominal tenderness.  Musculoskeletal:        General: Normal range of motion.     Cervical back: Normal range of motion and neck supple.  Skin:    General: Skin is warm and dry.     Capillary Refill: Capillary refill takes less than 2 seconds.  Neurological:     General: No focal deficit present.     Mental Status: He is alert and oriented to person, place, and time.     Cranial Nerves: No cranial nerve deficit.     Sensory: No sensory deficit.     Motor: No weakness.     Coordination: Coordination normal.  Psychiatric:        Mood and Affect: Mood normal.     ED Results / Procedures / Treatments   Labs (all labs ordered are listed, but only abnormal results are displayed) Labs Reviewed  LACTIC ACID, PLASMA - Abnormal; Notable for the following components:      Result Value   Lactic Acid, Venous 2.0 (*)  All other components within normal limits  CBC WITH DIFFERENTIAL/PLATELET - Abnormal; Notable for the following components:   WBC 2.8 (*)    Platelets 100 (*)    Neutro Abs 1.6 (*)    All other components within normal limits  COMPREHENSIVE METABOLIC PANEL - Abnormal; Notable for the following components:   Sodium 134 (*)    CO2 21 (*)    Glucose, Bld 212 (*)    Creatinine, Ser 1.58 (*)    Calcium 7.9 (*)     Albumin 3.3 (*)    GFR calc non Af Amer 36 (*)    GFR calc Af Amer 42 (*)    All other components within normal limits  D-DIMER, QUANTITATIVE (NOT AT [facility]) - Abnormal; Notable for the following components:   D-Dimer, Quant 5.62 (*)    All other components within normal limits  LACTATE DEHYDROGENASE - Abnormal; Notable for the following components:   LDH 217 (*)    All other components within normal limits  C-REACTIVE PROTEIN - Abnormal; Notable for the following components:   CRP 1.4 (*)    All other components within normal limits  POC SARS CORONAVIRUS 2 AG -  ED - Abnormal; Notable for the following components:   SARS Coronavirus 2 Ag POSITIVE (*)    All other components within normal limits  CBG MONITORING, ED - Abnormal; Notable for the following components:   Glucose-Capillary 197 (*)    All other components within normal limits  CULTURE, BLOOD (ROUTINE X 2)  CULTURE, BLOOD (ROUTINE X 2)  FERRITIN  TRIGLYCERIDES  FIBRINOGEN  LACTIC ACID, PLASMA  PROCALCITONIN    EKG EKG Interpretation  Date/Time:  Tuesday June 26 2019 09:49:18 EST Ventricular Rate:  83 PR Interval:    QRS Duration: 123 QT Interval:  422 QTC Calculation: 496 R Axis:   -31 Text Interpretation: Ventricular premature complex Right bundle branch block LVH with IVCD and secondary repol abnrm Borderline prolonged QT interval Baseline wander in lead(s) V2 Confirmed by Lennice Sites [phone]) on 06/26/2019 10:21:22 AM   Radiology DG Chest Port 1 View  Result Date: 06/26/2019 CLINICAL DATA:  COVID.  History of fever and fatigue. EXAM: PORTABLE CHEST 1 VIEW COMPARISON:  05/22/2017. FINDINGS: Prior CABG. Cardiomegaly. Very mild pulmonary venous congestion cannot be excluded. Very mild bilateral interstitial prominence noted. Mild CHF and or pneumonitis cannot be excluded. No pleural effusion or pneumothorax. No acute bony abnormality identified. IMPRESSION: Prior CABG. Cardiomegaly. Very mild pulmonary venous  congestion. Very mild bilateral interstitial prominence noted. Mild CHF and or pneumonitis cannot be excluded in this known COVID positive patient. Electronically Signed   By: Marcello Moores  Register   On: 06/26/2019 10:35    Procedures Procedures (including critical care time)  Medications Ordered in ED Medications  sodium chloride 0.9 % bolus 1,000 mL (1,000 mLs Intravenous New Bag/Given 06/26/19 1001)    ED Course  I have reviewed the triage vital signs and the nursing notes.  Pertinent labs & imaging results that were available during my care of the patient were reviewed by me and considered in my medical decision making (see chart for details).    MDM Rules/Calculators/A&P  ARIEL WINGROVE is a 86 year old male with history of CAD, diabetes who presents to the ED with worsening fatigue in the setting of positive coronavirus.  Patient with unremarkable vitals.  No fever.  Patient states that he is increasingly feeling rundown.  Lives at assisted living facility where people check on him.  Was  unable to get up out of bed today due to weakness.  Talk with family who states that patient got first dose of vaccine last Monday but tested +2 days later.  Was not having symptoms and was just having a general check but facility noticed that he had been a little less active.  Normally patient takes walks and uses a stationary bike but has not been able to do that the last couple days.  They have been trying to give him increased fluids at facility.  He denies any black stools or bloody stools.  No severe shortness of breath or chest pain or cough.  Has had low-grade fevers of one hundred at facility.  Will evaluate for signs of dehydration, get a chest x-ray. Will give fluid bolus.   Creatinine is elevated to 1.58.  Suspect AKI due to dehydration.  Lactic acid also elevated at 2.  Inflammatory markers also elevated including D-dimer.  Chest x-ray concerning for fluid versus infectious process.  Covid test is  still positive.  Otherwise lab work is unremarkable.  Suspect that Covid is causing deconditioning physically.  Would benefit from PT/OT, further hydration.  Admitted to medicine for further care.  This chart was dictated using voice recognition software.  Despite best efforts to proofread,  errors can occur which can change the documentation meaning.    Final Clinical Impression(s) / ED Diagnoses Final diagnoses:  COVID-19  AKI (acute kidney injury) ([place])  Weakness    Rx / DC Orders ED Discharge Orders    None       Lennice Sites, DO 06/26/19 1123

## 2019-06-26 NOTE — ED Notes (Signed)
Loyd Marhefka wants an update [phone].

## 2019-06-26 NOTE — ED Triage Notes (Signed)
Patient arrived by EMS from [facility] facility. Patient c/o weakness and generalized malaise. Patient was COVID positive since 1/13.   History of DM.   Pt is Alert and Oriented x 4 per EMS.     Denies SOB. Afebrile per EMS.

## 2019-06-26 NOTE — Progress Notes (Signed)
Arrived via EMS stretcher, alert and oriented x4, vitals stable. Ambulated to bed. Skin assessment complete, no redness, pressure related injuries, or open wounds noted. Mepilex to sacrum for protection. Keys and wallet placed in bag. Went over belongings with patient and given to security to lock in safe. No voiced needs or complaints at this time

## 2019-06-26 NOTE — ED Notes (Signed)
Date and time results received: 06/26/19 11:14 (use smartphrase ".now" to insert current time)  Test: Lactic Acid  Critical Value: 2.0  Name of Provider Notified: Dr. Elpidio Anis  Orders Received? Or Actions Taken?: Continue to monitor patient and await for new orders.

## 2019-06-26 NOTE — ED Notes (Signed)
Attempted to call report to [facility]. RN will call this RN back.

## 2019-06-26 NOTE — ED Notes (Signed)
Patient given sandwich, apple sauce, and water. Call bell within reach. Patient updated on plan of care.

## 2019-06-27 LAB — D-DIMER, QUANTITATIVE: D-Dimer, Quant: 3.48 ug/mL-FEU — ABNORMAL HIGH (ref 0.00–0.50)

## 2019-06-27 LAB — CBC WITH DIFFERENTIAL/PLATELET
Abs Immature Granulocytes: 0.01 10*3/uL (ref 0.00–0.07)
Basophils Absolute: 0 10*3/uL (ref 0.0–0.1)
Basophils Relative: 1 %
Eosinophils Absolute: 0 10*3/uL (ref 0.0–0.5)
Eosinophils Relative: 0 %
HCT: 44.4 % (ref 39.0–52.0)
Hemoglobin: 14.9 g/dL (ref 13.0–17.0)
Immature Granulocytes: 1 %
Lymphocytes Relative: 45 %
Lymphs Abs: 0.7 10*3/uL (ref 0.7–4.0)
MCH: 28.3 pg (ref 26.0–34.0)
MCHC: 33.6 g/dL (ref 30.0–36.0)
MCV: 84.3 fL (ref 80.0–100.0)
Monocytes Absolute: 0.1 10*3/uL (ref 0.1–1.0)
Monocytes Relative: 8 %
Neutro Abs: 0.7 10*3/uL — ABNORMAL LOW (ref 1.7–7.7)
Neutrophils Relative %: 45 %
Platelets: 127 10*3/uL — ABNORMAL LOW (ref 150–400)
RBC: 5.27 MIL/uL (ref 4.22–5.81)
RDW: 14.4 % (ref 11.5–15.5)
WBC: 1.5 10*3/uL — ABNORMAL LOW (ref 4.0–10.5)
nRBC: 0 % (ref 0.0–0.2)

## 2019-06-27 LAB — COMPREHENSIVE METABOLIC PANEL
ALT: 16 U/L (ref 0–44)
AST: 22 U/L (ref 15–41)
Albumin: 3.1 g/dL — ABNORMAL LOW (ref 3.5–5.0)
Alkaline Phosphatase: 79 U/L (ref 38–126)
Anion gap: 12 (ref 5–15)
BUN: 31 mg/dL — ABNORMAL HIGH (ref 8–23)
CO2: 18 mmol/L — ABNORMAL LOW (ref 22–32)
Calcium: 7.8 mg/dL — ABNORMAL LOW (ref 8.9–10.3)
Chloride: 102 mmol/L (ref 98–111)
Creatinine, Ser: 1.34 mg/dL — ABNORMAL HIGH (ref 0.61–1.24)
GFR calc Af Amer: 51 mL/min — ABNORMAL LOW (ref >60)
GFR calc non Af Amer: 44 mL/min — ABNORMAL LOW (ref >60)
Glucose, Bld: 371 mg/dL — ABNORMAL HIGH (ref 70–99)
Potassium: 4.2 mmol/L (ref 3.5–5.1)
Sodium: 132 mmol/L — ABNORMAL LOW (ref 135–145)
Total Bilirubin: 1.1 mg/dL (ref 0.3–1.2)
Total Protein: 6.7 g/dL (ref 6.5–8.1)

## 2019-06-27 LAB — C-REACTIVE PROTEIN: CRP: 2 mg/dL — ABNORMAL HIGH (ref <1.0)

## 2019-06-27 LAB — HEMOGLOBIN A1C
Hgb A1c MFr Bld: 8.7 % — ABNORMAL HIGH (ref 4.8–5.6)
Mean Plasma Glucose: 202.99 mg/dL

## 2019-06-27 LAB — GLUCOSE, CAPILLARY
Glucose-Capillary: 342 mg/dL — ABNORMAL HIGH (ref 70–99)
Glucose-Capillary: 257 mg/dL — ABNORMAL HIGH (ref 70–99)
Glucose-Capillary: 245 mg/dL — ABNORMAL HIGH (ref 70–99)
Glucose-Capillary: 185 mg/dL — ABNORMAL HIGH (ref 70–99)

## 2019-06-27 LAB — MAGNESIUM: Magnesium: 2 mg/dL (ref 1.7–2.4)

## 2019-06-27 LAB — BRAIN NATRIURETIC PEPTIDE: B Natriuretic Peptide: 381 pg/mL — ABNORMAL HIGH (ref 0.0–100.0)

## 2019-06-27 MED ORDER — INSULIN ASPART 100 UNIT/ML ~~LOC~~ SOLN
0.0000 [IU] | Freq: Three times a day (TID) | SUBCUTANEOUS | Status: DC
  Administered 2019-06-27: 17:00:00 8 [IU] via SUBCUTANEOUS
  Administered 2019-06-27: 5 [IU] via SUBCUTANEOUS
  Administered 2019-06-28 (×2): 3 [IU] via SUBCUTANEOUS
  Administered 2019-06-28: 5 [IU] via SUBCUTANEOUS
  Administered 2019-06-29: 08:00:00 2 [IU] via SUBCUTANEOUS

## 2019-06-27 MED ORDER — INSULIN GLARGINE 100 UNIT/ML ~~LOC~~ SOLN
10.0000 [IU] | Freq: Once | SUBCUTANEOUS | Status: AC
  Administered 2019-06-27: 10 [IU] via SUBCUTANEOUS
  Filled 2019-06-27: qty 0.1

## 2019-06-27 MED ORDER — INSULIN ASPART 100 UNIT/ML ~~LOC~~ SOLN: 0.0000 [IU] | Freq: Every day | SUBCUTANEOUS | Status: DC

## 2019-06-27 MED ORDER — INSULIN GLARGINE 100 UNIT/ML ~~LOC~~ SOLN
25.0000 [IU] | Freq: Every day | SUBCUTANEOUS | Status: DC
  Administered 2019-06-28 – 2019-06-30 (×3): 25 [IU] via SUBCUTANEOUS
  Filled 2019-06-27 (×3): qty 0.25

## 2019-06-27 MED ORDER — DEXAMETHASONE 4 MG PO TABS
4.0000 mg | ORAL_TABLET | ORAL | Status: DC
  Administered 2019-06-27: 4 mg via ORAL
  Filled 2019-06-27 (×3): qty 1

## 2019-06-27 MED ORDER — HYDRALAZINE HCL 25 MG PO TABS
50.0000 mg | ORAL_TABLET | Freq: Three times a day (TID) | ORAL | Status: DC
  Administered 2019-06-27 – 2019-06-30 (×9): 50 mg via ORAL
  Filled 2019-06-27 (×9): qty 2

## 2019-06-27 MED ORDER — ASPIRIN 81 MG PO CHEW
81.0000 mg | CHEWABLE_TABLET | Freq: Every day | ORAL | Status: DC
  Administered 2019-06-27 – 2019-06-30 (×4): 81 mg via ORAL
  Filled 2019-06-27 (×4): qty 1

## 2019-06-27 NOTE — Evaluation (Signed)
Occupational Therapy Evaluation Patient Details Name: Leonard [name] MRN: [id number] DOB: [birth year] Today's Date: 06/27/2019    History of Present Illness 86 y.o.malewith medical history significant ofremote prostate CA s/p radiation therapy; HTN; chronic diastolic CHF; CAD s/p CABG; and DM presenting with weakness in the setting of known COVID-19 infection. Admitted with Acute Covid 19 Viral Pneumonitis.   Clinical Impression   This 86 y/o male presents with the above. PTA pt reports mod independence with ADL and functional mobility using RW, living at ALF. Pt presenting with impaired cognition, generalized weakness and decreased standing balance. Pt currently requiring minA for functional mobility using rollator this session; completing LB ADL with minA. Pt on RA with SpO2 95% post room/hallway level activity. Pt with impaired cognition and requiring intermittent cues for safety and redirection. He will benefit from continued acute OT services, suspect pt will progress well and recommend return to current ALF if able with additional [place] services. Will follow.     Follow Up Recommendations  Home health OT;Supervision/Assistance - 24 hour(rec close to 24hr initially,back to ALF if able)    Equipment Recommendations  None recommended by OT           Precautions / Restrictions Precautions Precautions: Fall Restrictions Weight Bearing Restrictions: No      Mobility Bed Mobility Overal bed mobility: Needs Assistance Bed Mobility: Supine to Sit;Sit to Supine     Supine to sit: Supervision;HOB elevated Sit to supine: Supervision   General bed mobility comments: for safety  Transfers Overall transfer level: Needs assistance Equipment used: Rolling walker (2 wheeled) Transfers: Sit to/from Stand Sit to Stand: Min assist         General transfer comment: steady assist. legs somewhat wobbly., RW far ahead.    Balance Overall balance assessment: Needs  assistance Sitting-balance support: No upper extremity supported;Feet supported Sitting balance-Leahy Scale: Good Sitting balance - Comments: leans forward to don shoes   Standing balance support: Bilateral upper extremity supported;During functional activity Standing balance-Leahy Scale: Poor Standing balance comment: relies on UE support for steady balance                           ADL either performed or assessed with clinical judgement   ADL Overall ADL's : Needs assistance/impaired Eating/Feeding: Set up;Sitting   Grooming: Min guard;Sitting   Upper Body Bathing: Min guard;Set up;Sitting   Lower Body Bathing: Minimal assistance;Sit to/from stand   Upper Body Dressing : Set up;Min guard;Sitting   Lower Body Dressing: Minimal assistance;Sit to/from stand Lower Body Dressing Details (indicate cue type and reason): steadying assist in standing while pt fastens pants/belt (posterior sway during task), able to don shoes seated EOB given increased time/effort Toilet Transfer: Minimal assistance;+2 for safety/equipment;Ambulation;RW   Toileting- Clothing Manipulation and Hygiene: Minimal assistance;Sit to/from stand       Functional mobility during ADLs: Minimal assistance;Rolling walker;+2 for safety/equipment General ADL Comments: pt with weakness, impaired cognition                          Pertinent Vitals/Pain Pain Assessment: No/denies pain     Hand Dominance     Extremity/Trunk Assessment Upper Extremity Assessment Upper Extremity Assessment: Generalized weakness   Lower Extremity Assessment Lower Extremity Assessment: Defer to PT evaluation   Cervical / Trunk Assessment Cervical / Trunk Assessment: Kyphotic   Communication Communication Communication: No difficulties;HOH   Cognition Arousal/Alertness: Awake/alert Behavior During Therapy: Restless  Overall Cognitive Status: Impaired/Different from baseline Area of Impairment:  Attention;Safety/judgement;Awareness                   Current Attention Level: Focused     Safety/Judgement: Decreased awareness of safety;Decreased awareness of deficits Awareness: Intellectual   General Comments: per report from ALF, patient has become more confused, able to tell therapist he lives at [facility]" but is notably confused this PM and at times requiring redirection to task at hand   General Comments  VSS on RA, SpO2 95% post hallway mobility    Exercises     Shoulder Instructions      Home Living Family/patient expects to be discharged to:: Assisted living                             Home Equipment: Gilford Rile - 2 wheels   Additional Comments: patient indicates that he uses a RW      Prior Functioning/Environment Level of Independence: Needs assistance  Gait / Transfers Assistance Needed: chart indicates that patient was able to take care of self until illness              OT Problem List: Decreased strength;Decreased range of motion;Decreased activity tolerance;Impaired balance (sitting and/or standing);Decreased cognition;Decreased safety awareness;Decreased knowledge of use of DME or AE;Cardiopulmonary status limiting activity      OT Treatment/Interventions: Self-care/ADL training;Energy conservation;DME and/or AE instruction;Therapeutic activities;Patient/family education;Balance training;Cognitive remediation/compensation;Therapeutic exercise    OT Goals(Current goals can be found in the care plan section) Acute Rehab OT Goals Patient Stated Goal: agreeable to work with therapy OT Goal Formulation: With patient Time For Goal Achievement: 07/11/19 Potential to Achieve Goals: Good  OT Frequency: Min 2X/week   Barriers to D/C:            Co-evaluation PT/OT/SLP Co-Evaluation/Treatment: Yes Reason for Co-Treatment: For patient/therapist safety;Necessary to address cognition/behavior during functional activity   OT goals  addressed during session: ADL's and self-care      AM-PAC OT "6 Clicks" Daily Activity     Outcome Measure Help from another person eating meals?: A Little Help from another person taking care of personal grooming?: A Little Help from another person toileting, which includes using toliet, bedpan, or urinal?: A Little Help from another person bathing (including washing, rinsing, drying)?: A Little Help from another person to put on and taking off regular upper body clothing?: A Little Help from another person to put on and taking off regular lower body clothing?: A Little 6 Click Score: 18   End of Session Equipment Utilized During Treatment: Gait belt;Rolling walker Nurse Communication: Mobility status  Activity Tolerance: Patient tolerated treatment well Patient left: in bed;with call bell/phone within reach;with bed alarm set  OT Visit Diagnosis: Unsteadiness on feet (R26.81);Muscle weakness (generalized) (M62.81);Other symptoms and signs involving cognitive function                Time: 9242-6834 OT Time Calculation (min): 28 min Charges:  OT General Charges $OT Visit: 1 Visit OT Evaluation $OT Eval Moderate Complexity: 1 Mod  Lou Cal, OT [employer] Pager [phone] Office [phone]   Raymondo Band 06/27/2019, 4:51 PM

## 2019-06-27 NOTE — Progress Notes (Signed)
PROGRESS NOTE                                                                                                                                                                                                             Patient Demographics:    Leonard [name], is a 86 y.o. male, DOB - 1921-05-11, PIR:[id number]  Outpatient Primary MD for the patient is Hoyt Koch, MD    LOS - 1  Admit date - 06/26/2019    Chief Complaint  Patient presents with  . Fatigue    Covid+       Brief Narrative - Leonard [name] is a 86 y.o. male with medical history significant of remote prostate CA s/p radiation therapy; HTN; chronic diastolic CHF; CAD s/p CABG; and DM presenting with weakness in the setting of known COVID-19 infection.  The patient is pleasant and conversant, in NAD.   Subjective:    Raylene Everts today has, No headache, No chest pain, No abdominal pain - No Nausea, No new weakness tingling or numbness, no Cough - SOB.     Assessment  & Plan :     1.  Acute Covid 19 Viral Pneumonitis during the ongoing 2020 Covid 19 Pandemic - he has mild, he is on steroids and remdesivir, he seems to be doing fairly well.  Start tapering steroids.  Encouraged the patient to sit up in chair in the daytime use I-S and flutter valve for pulmonary toiletry and then prone in bed when at night.     SpO2: 98 %  Recent Labs  Lab 06/26/19 0952 06/27/19 0334  CRP 1.4* 2.0*  DDIMER 5.62* 3.48*  FERRITIN 190  --   BNP  --  381.0*  PROCALCITON <0.10  --     Hepatic Function Latest Ref Rng & Units 06/27/2019 06/26/2019 05/22/2017  Total Protein 6.5 - 8.1 g/dL 6.7 6.8 5.7(L)  Albumin 3.5 - 5.0 g/dL 3.1(L) 3.3(L) 3.1(L)  AST 15 - 41 U/L 22 21 22   ALT 0 - 44 U/L 16 13 14(L)  Alk Phosphatase 38 - 126 U/L 79 90 91  Total Bilirubin 0.3 - 1.2 mg/dL 1.1 1.0 1.1  Bilirubin, Direct 0.0 - 0.3 mg/dL - - -    2.  Elevated D-dimer due to  inflammation.  Responding well to heparin continue.  3.  Dehydration induced AKI.  Resolved after IV fluids.  He has underlying CKD 3 and creatinine now back to his baseline of around 1.3.  Continue to hold ARB.  4.  Essential hypertension.  Currently on Norvasc will add hydralazine for better control.  5.  History of prostate cancer.  Continue Myrbetriq and Flomax combination.  Follow with PCP outpatient for discharge.  6.  CAD s/p CABG.  No acute issues.  Continue long-acting nitrate.  Resume home dose aspirin.  7.  DM type II.  On Lantus and sliding scale, have increased dose for better control.  Will check A1c.  Lab Results  Component Value Date   HGBA1C 8.8 (H) 03/11/2017   CBG (last 3)  Recent Labs    06/26/19 1621 06/26/19 1851 06/27/19 0837  GLUCAP 261* 260* 342*     Condition - Fair  Family Communication  :  Daughter 06/27/19  Code Status :   DNR  Diet :   Diet Order            Diet heart healthy/carb modified Room service appropriate? Yes; Fluid consistency: Thin  Diet effective now               Disposition Plan  :  ALF  Consults  :  None  Procedures  :    PUD Prophylaxis :   DVT Prophylaxis  :    Heparin   Lab Results  Component Value Date   PLT 127 (L) 06/27/2019    Inpatient Medications  Scheduled Meds: . amLODipine  10 mg Oral Daily  . dexamethasone  4 mg Oral Q24H  . gabapentin  300 mg Oral BID  . heparin injection (subcutaneous)  5,000 Units Subcutaneous Q8H  . insulin aspart  0-9 Units Subcutaneous TID WC  . insulin glargine  9 Units Subcutaneous Daily  . isosorbide dinitrate  10 mg Oral TID  . latanoprost  1 drop Both Eyes QHS  . mirabegron ER  25 mg Oral Daily  . tamsulosin  0.4 mg Oral Daily   Continuous Infusions: . remdesivir 100 mg in NS 100 mL 100 mg (06/27/19 0952)   PRN Meds:.acetaminophen, albuterol, bisacodyl, chlorpheniramine-HYDROcodone, guaiFENesin-dextromethorphan, [DISCONTINUED] ondansetron **OR** ondansetron  (ZOFRAN) IV, polyethylene glycol, sodium phosphate  Antibiotics  :    Anti-infectives (From admission, onward)   Start     Dose/Rate Route Frequency Ordered Stop   06/27/19 1000  remdesivir 100 mg in sodium chloride 0.9 % 100 mL IVPB     100 mg 200 mL/hr over 30 Minutes Intravenous Daily 06/26/19 1209 07/01/19 0959   06/26/19 1300  remdesivir 200 mg in sodium chloride 0.9% 250 mL IVPB     200 mg 580 mL/hr over 30 Minutes Intravenous Once 06/26/19 1209 06/26/19 1407       Time Spent in minutes  30   Lala Lund M.D on 06/27/2019 at 10:22 AM  To page go to [email or web address] - password [facility]  [facility] -  Office  [phone]   See all Orders from today for further details    Objective:   Vitals:   06/26/19 2019 06/27/19 0051 06/27/19 0408 06/27/19 0737  BP: (!) 146/73 (!) 144/74 135/77 (!) 161/76  Pulse: 77 79 76 66  Resp: 17 17 16 17   Temp: 97.9 F (36.6 C) 97.6 F (36.4 C) 97.8 F (36.6 C) 97.9 F (36.6 C)  TempSrc: Oral Oral Oral Oral  SpO2: 99% 99% 99% 98%  Weight:      Height:  Wt Readings from Last 3 Encounters:  06/26/19 62.6 kg  07/19/17 68 kg  05/22/17 68.5 kg     Intake/Output Summary (Last 24 hours) at 06/27/2019 1022 Last data filed at 06/27/2019 0410 Gross per 24 hour  Intake 1472.67 ml  Output 880 ml  Net 592.67 ml     Physical Exam  Awake Alert,   No new F.N deficits, Normal affect [place].AT,PERRAL Supple Neck,No JVD, No cervical lymphadenopathy appriciated.  Symmetrical Chest wall movement, Good air movement bilaterally, CTAB RRR,No Gallops,Rubs or new Murmurs, No Parasternal Heave +ve B.Sounds, Abd Soft, No tenderness, No organomegaly appriciated, No rebound - guarding or rigidity. No Cyanosis, Clubbing or edema, No new Rash or bruise       Data Review:    CBC Recent Labs  Lab 06/26/19 0952 06/27/19 0334  WBC 2.8* 1.5*  HGB 14.6 14.9  HCT 45.3 44.4  PLT 100* 127*  MCV 86.5 84.3  MCH 27.9 28.3  MCHC 32.2 33.6   RDW 14.5 14.4  LYMPHSABS 1.0 0.7  MONOABS 0.2 0.1  EOSABS 0.0 0.0  BASOSABS 0.0 0.0    Chemistries  Recent Labs  Lab 06/26/19 0952 06/27/19 0334  NA 134* 132*  K 3.8 4.2  CL 102 102  CO2 21* 18*  GLUCOSE 212* 371*  BUN 21 31*  CREATININE 1.58* 1.34*  CALCIUM 7.9* 7.8*  MG  --  2.0  AST 21 22  ALT 13 16  ALKPHOS 90 79  BILITOT 1.0 1.1   ------------------------------------------------------------------------------------------------------------------ Recent Labs    06/26/19 0952  TRIG 98    Lab Results  Component Value Date   HGBA1C 8.8 (H) 03/11/2017   ------------------------------------------------------------------------------------------------------------------ No results for input(s): TSH, T4TOTAL, T3FREE, THYROIDAB in the last 72 hours.  Invalid input(s): FREET3  Cardiac Enzymes No results for input(s): CKMB, TROPONINI, MYOGLOBIN in the last 168 hours.  Invalid input(s): CK ------------------------------------------------------------------------------------------------------------------    Component Value Date/Time   BNP 381.0 (H) 06/27/2019 0334    Micro Results Recent Results (from the past 240 hour(s))  Blood Culture (routine x 2)     Status: None (Preliminary result)   Collection Time: 06/26/19  7:53 AM   Specimen: BLOOD  Result Value Ref Range Status   Specimen Description   Final    BLOOD LEFT ANTECUBITAL Performed at [facility] [address], [place], [place] [address]    Special Requests   Final    BOTTLES DRAWN AEROBIC AND ANAEROBIC Blood Culture results may not be optimal due to an excessive volume of blood received in culture bottles Performed at [facility] [address], [place], [place] [address]    Culture   Final    NO GROWTH < 24 HOURS Performed at [facility] [address], [place], [place] [address]    Report Status PENDING  Incomplete  Blood Culture (routine x 2)      Status: None (Preliminary result)   Collection Time: 06/26/19  9:52 AM   Specimen: BLOOD  Result Value Ref Range Status   Specimen Description   Final    BLOOD BLOOD RIGHT FOREARM Performed at [facility] [address], [place], [place] [address]    Special Requests   Final    BOTTLES DRAWN AEROBIC AND ANAEROBIC Blood Culture results may not be optimal due to an excessive volume of blood received in culture bottles Performed at [facility] [address], [place], [place] [address]  Culture   Final    NO GROWTH < 24 HOURS Performed at [facility], [place] [address] [place], [place] [address]    Report Status PENDING  Incomplete    Radiology Reports DG Chest Port 1 View  Result Date: 06/26/2019 CLINICAL DATA:  COVID.  History of fever and fatigue. EXAM: PORTABLE CHEST 1 VIEW COMPARISON:  05/22/2017. FINDINGS: Prior CABG. Cardiomegaly. Very mild pulmonary venous congestion cannot be excluded. Very mild bilateral interstitial prominence noted. Mild CHF and or pneumonitis cannot be excluded. No pleural effusion or pneumothorax. No acute bony abnormality identified. IMPRESSION: Prior CABG. Cardiomegaly. Very mild pulmonary venous congestion. Very mild bilateral interstitial prominence noted. Mild CHF and or pneumonitis cannot be excluded in this known COVID positive patient. Electronically Signed   By: Marcello Moores  Register   On: 06/26/2019 10:35

## 2019-06-27 NOTE — Evaluation (Signed)
Physical Therapy Evaluation Patient Details Name: Leonard [name] MRN: [id number] DOB: 1920/12/05 Today's Date: 06/27/2019   History of Present Illness  86 y.o.malewith medical history significant ofremote prostate CA s/p radiation therapy; HTN; chronic diastolic CHF; CAD s/p CABG; and DM presenting with weakness in the setting of known COVID-19 infection. Admitted with Acute Covid 19 Viral Pneumonitis.  Clinical Impression   The patient is quite restless, requires redirection for activity, mobilizing and safety. Patient ambulated x 250' with $ wheeled Rw, min assist for safety and steady gait. Per chart, patient modified independent. Patient may require some assistance of returns to ALF for safety and ambulation. Patient ambulated on RA with SPO2 100%. Pt admitted with above diagnosis. Pt currently with functional limitations due to the deficits listed below (see PT Problem List). Pt will benefit from skilled PT to increase their independence and safety with mobility to allow discharge to the venue listed below.       Follow Up Recommendations Home health PT(return to ALF if possible)    Equipment Recommendations  None recommended by PT    Recommendations for Other Services       Precautions / Restrictions Precautions Precautions: Fall      Mobility  Bed Mobility Overal bed mobility: Needs Assistance Bed Mobility: Supine to Sit;Sit to Supine     Supine to sit: Supervision;HOB elevated Sit to supine: Supervision      Transfers Overall transfer level: Needs assistance Equipment used: Rolling walker (2 wheeled) Transfers: Sit to/from Stand Sit to Stand: Min assist         General transfer comment: steady assist. legs somewhat wobbly., RW far ahead.  Ambulation/Gait Ambulation/Gait assistance: Min assist;Mod assist Gait Distance (Feet): 250 Feet Assistive device: 4-wheeled walker Gait Pattern/deviations: Step-through pattern;Drifts right/left;Wide base of  support;Decreased step length - left;Decreased step length - right;Shuffle;Trunk flexed Gait velocity: decr   General Gait Details: RW ahead of patient, initially unsteady, decreased control of the legs, gradual improvement but still required min assist.  Stairs            Wheelchair Mobility    Modified Rankin (Stroke Patients Only)       Balance Overall balance assessment: Needs assistance Sitting-balance support: No upper extremity supported;Feet supported Sitting balance-Leahy Scale: Good Sitting balance - Comments: leans forward to don shoes   Standing balance support: Bilateral upper extremity supported;During functional activity Standing balance-Leahy Scale: Poor Standing balance comment: relies on UE support for steady balance                             Pertinent Vitals/Pain Pain Assessment: No/denies pain    Home Living Family/patient expects to be discharged to:: Assisted living                 Additional Comments: patient indicates that he uses a RW    Prior Function Level of Independence: Needs assistance   Gait / Transfers Assistance Needed: chart indicates that patient was able to take care of self until illness           Hand Dominance        Extremity/Trunk Assessment        Lower Extremity Assessment Lower Extremity Assessment: Generalized weakness    Cervical / Trunk Assessment Cervical / Trunk Assessment: Kyphotic  Communication      Cognition Arousal/Alertness: Awake/alert Behavior During Therapy: Restless Overall Cognitive Status: Impaired/Different from baseline Area of Impairment: Attention;Safety/judgement;Awareness  Current Attention Level: Focused     Safety/Judgement: Decreased awareness of safety;Decreased awareness of deficits Awareness: Intellectual   General Comments: per report from ALF, patient has become more confused, redirction  at times, patient perseverated on  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 Patient aware that he moved from 108 to 322.      General Comments      Exercises     Assessment/Plan    PT Assessment Patient needs continued PT services  PT Problem List Decreased strength;Decreased mobility;Decreased safety awareness;Decreased activity tolerance;Decreased cognition;Decreased knowledge of precautions;Decreased balance;Decreased knowledge of use of DME       PT Treatment Interventions DME instruction;Therapeutic activities;Gait training;Therapeutic exercise;Functional mobility training;Balance training;Patient/family education    PT Goals (Current goals can be found in the Care Plan section)  Acute Rehab PT Goals Patient Stated Goal: agreeable to ambulate. PT Goal Formulation: Patient unable to participate in goal setting Time For Goal Achievement: 07/11/19 Potential to Achieve Goals: Good    Frequency Min 2X/week   Barriers to discharge        Co-evaluation               AM-PAC PT "6 Clicks" Mobility  Outcome Measure Help needed turning from your back to your side while in a flat bed without using bedrails?: None Help needed moving from lying on your back to sitting on the side of a flat bed without using bedrails?: None Help needed moving to and from a bed to a chair (including a wheelchair)?: A Little Help needed standing up from a chair using your arms (e.g., wheelchair or bedside chair)?: A Little Help needed to walk in hospital room?: A Lot Help needed climbing 3-5 steps with a railing? : Total 6 Click Score: 17    End of Session Equipment Utilized During Treatment: Gait belt Activity Tolerance: Patient tolerated treatment well Patient left: in bed;with bed alarm set;with call bell/phone within reach Nurse Communication: Mobility status PT Visit Diagnosis: Unsteadiness on feet (R26.81);Difficulty in walking, not elsewhere classified (R26.2)    Time: 0865-7846 PT Time  Calculation (min) (ACUTE ONLY): 28 min   Charges:   PT Evaluation $PT Eval Low Complexity: Industry PT Acute Rehabilitation Services Pager [phone] Office [phone]   Claretha Cooper 06/27/2019, 4:25 PM

## 2019-06-27 NOTE — Progress Notes (Signed)
Inpatient Diabetes Program Recommendations  AACE/ADA: New Consensus Statement on Inpatient Glycemic Control (2015)  Target Ranges:  Prepandial:   less than 140 mg/dL      Peak postprandial:   less than 180 mg/dL (1-2 hours)      Critically ill patients:  140 - 180 mg/dL   Lab Results  Component Value Date   GLUCAP 260 (H) 06/26/2019   HGBA1C 8.8 (H) 03/11/2017    Review of Glycemic Control Results for Leonard [name], Leonard [name] (MRN [id number]) as of 06/27/2019 08:49  Ref. Range 06/27/2019 03:34  Glucose Latest Ref Range: 70 - 99 mg/dL 371 (H)   Diabetes history: DM 2 Outpatient Diabetes medications: Basaglar 9 units Daily, Humalog 0-7 units tid Current orders for Inpatient glycemic control:  Lantus 9 units Daily Novolog 0-9 units tid  Decadron 6 mg Q24 hours BUN/Creat: 31/1.34  Inpatient Diabetes Program Recommendations:    Glucose 370's this am after home dose of lantus last night. Pt may benefit from increasing frequency of lantus to bid dosing.   Also patient's correction scale at home is similar to our moderate. Pt will benefit from increasing Novolog Correction and adding hs scale.  Dr. Candiss Norse to assess on rounds today.  Thanks,  Tama Headings RN, MSN, BC-ADM Inpatient Diabetes Coordinator Team Pager [phone] (8a-5p)

## 2019-06-28 LAB — COMPREHENSIVE METABOLIC PANEL
ALT: 16 U/L (ref 0–44)
AST: 28 U/L (ref 15–41)
Albumin: 3.6 g/dL (ref 3.5–5.0)
Alkaline Phosphatase: 91 U/L (ref 38–126)
Anion gap: 14 (ref 5–15)
BUN: 36 mg/dL — ABNORMAL HIGH (ref 8–23)
CO2: 22 mmol/L (ref 22–32)
Calcium: 8.4 mg/dL — ABNORMAL LOW (ref 8.9–10.3)
Chloride: 102 mmol/L (ref 98–111)
Creatinine, Ser: 1.42 mg/dL — ABNORMAL HIGH (ref 0.61–1.24)
GFR calc Af Amer: 48 mL/min — ABNORMAL LOW (ref >60)
GFR calc non Af Amer: 41 mL/min — ABNORMAL LOW (ref >60)
Glucose, Bld: 205 mg/dL — ABNORMAL HIGH (ref 70–99)
Potassium: 3.8 mmol/L (ref 3.5–5.1)
Sodium: 138 mmol/L (ref 135–145)
Total Bilirubin: 0.8 mg/dL (ref 0.3–1.2)
Total Protein: 7.4 g/dL (ref 6.5–8.1)

## 2019-06-28 LAB — D-DIMER, QUANTITATIVE: D-Dimer, Quant: 3.05 ug/mL-FEU — ABNORMAL HIGH (ref 0.00–0.50)

## 2019-06-28 LAB — CBC WITH DIFFERENTIAL/PLATELET
Abs Immature Granulocytes: 0.01 10*3/uL (ref 0.00–0.07)
Basophils Absolute: 0 10*3/uL (ref 0.0–0.1)
Basophils Relative: 0 %
Eosinophils Absolute: 0 10*3/uL (ref 0.0–0.5)
Eosinophils Relative: 0 %
HCT: 47.2 % (ref 39.0–52.0)
Hemoglobin: 15.8 g/dL (ref 13.0–17.0)
Immature Granulocytes: 0 %
Lymphocytes Relative: 29 %
Lymphs Abs: 1.1 10*3/uL (ref 0.7–4.0)
MCH: 27.7 pg (ref 26.0–34.0)
MCHC: 33.5 g/dL (ref 30.0–36.0)
MCV: 82.8 fL (ref 80.0–100.0)
Monocytes Absolute: 0.2 10*3/uL (ref 0.1–1.0)
Monocytes Relative: 5 %
Neutro Abs: 2.6 10*3/uL (ref 1.7–7.7)
Neutrophils Relative %: 66 %
Platelets: 150 10*3/uL (ref 150–400)
RBC: 5.7 MIL/uL (ref 4.22–5.81)
RDW: 14.5 % (ref 11.5–15.5)
WBC: 3.9 10*3/uL — ABNORMAL LOW (ref 4.0–10.5)
nRBC: 0 % (ref 0.0–0.2)

## 2019-06-28 LAB — GLUCOSE, CAPILLARY
Glucose-Capillary: 294 mg/dL — ABNORMAL HIGH (ref 70–99)
Glucose-Capillary: 198 mg/dL — ABNORMAL HIGH (ref 70–99)
Glucose-Capillary: 238 mg/dL — ABNORMAL HIGH (ref 70–99)
Glucose-Capillary: 189 mg/dL — ABNORMAL HIGH (ref 70–99)
Glucose-Capillary: 140 mg/dL — ABNORMAL HIGH (ref 70–99)

## 2019-06-28 LAB — C-REACTIVE PROTEIN: CRP: 1.3 mg/dL — ABNORMAL HIGH (ref <1.0)

## 2019-06-28 LAB — MAGNESIUM: Magnesium: 2.1 mg/dL (ref 1.7–2.4)

## 2019-06-28 LAB — BRAIN NATRIURETIC PEPTIDE: B Natriuretic Peptide: 268.2 pg/mL — ABNORMAL HIGH (ref 0.0–100.0)

## 2019-06-28 MED ORDER — HALOPERIDOL LACTATE 5 MG/ML IJ SOLN: 2.0000 mg | Freq: Four times a day (QID) | INTRAMUSCULAR | Status: DC | PRN

## 2019-06-28 MED ORDER — DEXAMETHASONE 2 MG PO TABS
2.0000 mg | ORAL_TABLET | ORAL | Status: DC
  Administered 2019-06-28 – 2019-06-29 (×2): 2 mg via ORAL
  Filled 2019-06-28 (×3): qty 1

## 2019-06-28 NOTE — Progress Notes (Addendum)
PROGRESS NOTE                                                                                                                                                                                                             Patient Demographics:    Leonard [name], is a 86 y.o. male, DOB - Feb 10, 1921, NWG:[id number]  Outpatient Primary MD for the patient is Hoyt Koch, MD    LOS - 2  Admit date - 06/26/2019    Chief Complaint  Patient presents with  . Fatigue    Covid+       Brief Narrative - Leonard [name] is a 86 y.o. male with medical history significant of remote prostate CA s/p radiation therapy; HTN; chronic diastolic CHF; CAD s/p CABG; and DM presenting with weakness in the setting of known COVID-19 infection.  The patient is pleasant and conversant, in NAD.   Subjective:   Patient in bed, appears comfortable, denies any headache, no fever, no chest pain or pressure, no shortness of breath , no abdominal pain. No focal weakness.    Assessment  & Plan :     1.  Acute Covid 19 Viral Pneumonitis during the ongoing 2020 Covid 19 Pandemic - he has mild, he is on steroids and remdesivir, he seems to be doing fairly well.  Have started to taper steroids, he is doing fairly well and currently on room air however he does not have arrangements for go to the outpatient clinic to complete his remdesivir infusion hence he will stay here.  Encouraged the patient to sit up in chair in the daytime use I-S and flutter valve for pulmonary toiletry and then prone in bed when at night.     SpO2: 92 %  Recent Labs  Lab 06/26/19 0952 06/27/19 0334 06/28/19 0540  CRP 1.4* 2.0* 1.3*  DDIMER 5.62* 3.48* 3.05*  FERRITIN 190  --   --   BNP  --  381.0* 268.2*  PROCALCITON <0.10  --   --     Hepatic Function Latest Ref Rng & Units 06/28/2019 06/27/2019 06/26/2019  Total Protein 6.5 - 8.1 g/dL 7.4 6.7 6.8  Albumin 3.5 - 5.0  g/dL 3.6 3.1(L) 3.3(L)  AST 15 - 41 U/L 28 22 21   ALT 0 - 44 U/L 16 16 13   Alk Phosphatase 38 -  126 U/L 91 79 90  Total Bilirubin 0.3 - 1.2 mg/dL 0.8 1.1 1.0  Bilirubin, Direct 0.0 - 0.3 mg/dL - - -    2.  Elevated D-dimer due to inflammation.  Responding well to heparin continue.  3.  Dehydration induced AKI.  Resolved after IV fluids.  He has underlying CKD 3 and creatinine now back to his baseline of around 1.3.  Continue to hold ARB.  4.  Essential hypertension.  Currently on Norvasc will add hydralazine for better control.  5.  History of prostate cancer.  Continue Myrbetriq and Flomax combination.  Follow with PCP outpatient for discharge.  6.  CAD s/p CABG.  No acute issues.  Continue long-acting nitrate.  Resume home dose aspirin.  7.  Mild delirium hospital-acquired.  Mild hospital-acquired encephalopathy due to acute illness and unfamiliar settings, as needed Haldol.  Minimize narcotics and benzodiazepines.  Family updated about it.  8. DM type II.  On Lantus and sliding scale, have increased dose for better control.  Will check A1c.  Lab Results  Component Value Date   HGBA1C 8.7 (H) 06/27/2019   CBG (last 3)  Recent Labs    06/27/19 1304 06/27/19 2202 06/28/19 0740  GLUCAP 245* 185* 198*     Condition - Fair  Family Communication  :  Daughter 06/27/19, 10/21  Code Status :   DNR  Diet :   Diet Order            Diet heart healthy/carb modified Room service appropriate? Yes; Fluid consistency: Thin  Diet effective now               Disposition Plan  :  ALF once he finishes his remdesivir  Consults  :  None  Procedures  :    PUD Prophylaxis :   DVT Prophylaxis  :    Heparin   Lab Results  Component Value Date   PLT 150 06/28/2019    Inpatient Medications  Scheduled Meds: . amLODipine  10 mg Oral Daily  . aspirin  81 mg Oral Daily  . dexamethasone  2 mg Oral Q24H  . gabapentin  300 mg Oral BID  . heparin injection (subcutaneous)  5,000  Units Subcutaneous Q8H  . hydrALAZINE  50 mg Oral Q8H  . insulin aspart  0-15 Units Subcutaneous TID WC  . insulin aspart  0-5 Units Subcutaneous QHS  . insulin glargine  25 Units Subcutaneous Daily  . isosorbide dinitrate  10 mg Oral TID  . latanoprost  1 drop Both Eyes QHS  . mirabegron ER  25 mg Oral Daily  . tamsulosin  0.4 mg Oral Daily   Continuous Infusions: . remdesivir 100 mg in NS 100 mL 100 mg (06/28/19 0845)   PRN Meds:.acetaminophen, albuterol, bisacodyl, chlorpheniramine-HYDROcodone, guaiFENesin-dextromethorphan, haloperidol lactate, [DISCONTINUED] ondansetron **OR** ondansetron (ZOFRAN) IV, polyethylene glycol, sodium phosphate  Antibiotics  :    Anti-infectives (From admission, onward)   Start     Dose/Rate Route Frequency Ordered Stop   06/27/19 1000  remdesivir 100 mg in sodium chloride 0.9 % 100 mL IVPB     100 mg 200 mL/hr over 30 Minutes Intravenous Daily 06/26/19 1209 07/01/19 0959   06/26/19 1300  remdesivir 200 mg in sodium chloride 0.9% 250 mL IVPB     200 mg 580 mL/hr over 30 Minutes Intravenous Once 06/26/19 1209 06/26/19 1407       Time Spent in minutes  30   Lala Lund M.D on 06/28/2019 at 9:47 AM  To page go to [email or web address] - password [facility]  [facility] -  Office  [phone]   See all Orders from today for further details    Objective:   Vitals:   06/27/19 1651 06/27/19 1938 06/28/19 0548 06/28/19 0739  BP: 125/74 121/81 (!) 158/64 105/62  Pulse: 80 (!) 47 70 68  Resp: 18 18 18 18   Temp: 98.1 F (36.7 C) 97.9 F (36.6 C) 97.8 F (36.6 C) 97.7 F (36.5 C)  TempSrc: Oral Oral Oral Oral  SpO2: 96% 95% 95% 92%  Weight:      Height:        Wt Readings from Last 3 Encounters:  06/26/19 62.6 kg  07/19/17 68 kg  05/22/17 68.5 kg     Intake/Output Summary (Last 24 hours) at 06/28/2019 0947 Last data filed at 06/28/2019 0940 Gross per 24 hour  Intake 701.12 ml  Output --  Net 701.12 ml     Physical Exam  Awake,  mildly confused no new F.N deficits,   [place].AT,PERRAL Supple Neck,No JVD, No cervical lymphadenopathy appriciated.  Symmetrical Chest wall movement, Good air movement bilaterally, CTAB RRR,No Gallops, Rubs or new Murmurs, No Parasternal Heave +ve B.Sounds, Abd Soft, No tenderness, No organomegaly appriciated, No rebound - guarding or rigidity. No Cyanosis, Clubbing or edema, No new Rash or bruise     Data Review:    CBC Recent Labs  Lab 06/26/19 0952 06/27/19 0334 06/28/19 0540  WBC 2.8* 1.5* 3.9*  HGB 14.6 14.9 15.8  HCT 45.3 44.4 47.2  PLT 100* 127* 150  MCV 86.5 84.3 82.8  MCH 27.9 28.3 27.7  MCHC 32.2 33.6 33.5  RDW 14.5 14.4 14.5  LYMPHSABS 1.0 0.7 1.1  MONOABS 0.2 0.1 0.2  EOSABS 0.0 0.0 0.0  BASOSABS 0.0 0.0 0.0    Chemistries  Recent Labs  Lab 06/26/19 0952 06/27/19 0334 06/28/19 0540  NA 134* 132* 138  K 3.8 4.2 3.8  CL 102 102 102  CO2 21* 18* 22  GLUCOSE 212* 371* 205*  BUN 21 31* 36*  CREATININE 1.58* 1.34* 1.42*  CALCIUM 7.9* 7.8* 8.4*  MG  --  2.0 2.1  AST 21 22 28   ALT 13 16 16   ALKPHOS 90 79 91  BILITOT 1.0 1.1 0.8   ------------------------------------------------------------------------------------------------------------------ Recent Labs    06/26/19 0952  TRIG 98    Lab Results  Component Value Date   HGBA1C 8.7 (H) 06/27/2019   ------------------------------------------------------------------------------------------------------------------ No results for input(s): TSH, T4TOTAL, T3FREE, THYROIDAB in the last 72 hours.  Invalid input(s): FREET3  Cardiac Enzymes No results for input(s): CKMB, TROPONINI, MYOGLOBIN in the last 168 hours.  Invalid input(s): CK ------------------------------------------------------------------------------------------------------------------    Component Value Date/Time   BNP 268.2 (H) 06/28/2019 0540    Micro Results Recent Results (from the past 240 hour(s))  Blood Culture (routine x 2)      Status: None (Preliminary result)   Collection Time: 06/26/19  7:53 AM   Specimen: BLOOD  Result Value Ref Range Status   Specimen Description   Final    BLOOD LEFT ANTECUBITAL Performed at [facility] [address], [place], [place] [address]    Special Requests   Final    BOTTLES DRAWN AEROBIC AND ANAEROBIC Blood Culture results may not be optimal due to an excessive volume of blood received in culture bottles Performed at [facility] [address], [place], [place] [address]    Culture   Final    NO  GROWTH 2 DAYS Performed at [facility], [place] [address] [place], [place] [address]    Report Status PENDING  Incomplete  Blood Culture (routine x 2)     Status: None (Preliminary result)   Collection Time: 06/26/19  9:52 AM   Specimen: BLOOD  Result Value Ref Range Status   Specimen Description   Final    BLOOD BLOOD RIGHT FOREARM Performed at [facility] [address], [place], [place] [address]    Special Requests   Final    BOTTLES DRAWN AEROBIC AND ANAEROBIC Blood Culture results may not be optimal due to an excessive volume of blood received in culture bottles Performed at [facility] [address], [place], [place] [address]    Culture   Final    NO GROWTH 2 DAYS Performed at [facility] [address], [place], [place] [address]    Report Status PENDING  Incomplete    Radiology Reports DG Chest Port 1 View  Result Date: 06/26/2019 CLINICAL DATA:  COVID.  History of fever and fatigue. EXAM: PORTABLE CHEST 1 VIEW COMPARISON:  05/22/2017. FINDINGS: Prior CABG. Cardiomegaly. Very mild pulmonary venous congestion cannot be excluded. Very mild bilateral interstitial prominence noted. Mild CHF and or pneumonitis cannot be excluded. No pleural effusion or pneumothorax. No acute bony abnormality identified. IMPRESSION: Prior CABG. Cardiomegaly. Very mild pulmonary venous  congestion. Very mild bilateral interstitial prominence noted. Mild CHF and or pneumonitis cannot be excluded in this known COVID positive patient. Electronically Signed   By: Marcello Moores  Register   On: 06/26/2019 10:35

## 2019-06-28 NOTE — Progress Notes (Signed)
Physical Therapy Treatment Patient Details Name: Leonard [name] MRN: [id number] DOB: 1921-02-27 Today's Date: 06/28/2019    History of Present Illness 86 y.o.malewith medical history significant ofremote prostate CA s/p radiation therapy; HTN; chronic diastolic CHF; CAD s/p CABG; and DM presenting with weakness in the setting of known COVID-19 infection. Admitted with Acute Covid 19 Viral Pneumonitis.    PT Comments    Pt making steady progress with mobility. Hopeful he can return to ALF facility.   Follow Up Recommendations  Home health PT(At ALF)     Equipment Recommendations  None recommended by PT    Recommendations for Other Services       Precautions / Restrictions Precautions Precautions: Fall Restrictions Weight Bearing Restrictions: No    Mobility  Bed Mobility Overal bed mobility: Needs Assistance Bed Mobility: Supine to Sit     Supine to sit: Supervision;HOB elevated     General bed mobility comments: assist for safety  Transfers Overall transfer level: Needs assistance Equipment used: 4-wheeled walker Transfers: Sit to/from Stand Sit to Stand: Min guard         General transfer comment: Assist for safety  Ambulation/Gait Ambulation/Gait assistance: Min assist Gait Distance (Feet): 250 Feet Assistive device: 4-wheeled walker Gait Pattern/deviations: Step-through pattern;Decreased step length - right;Decreased step length - left;Trunk flexed Gait velocity: decr Gait velocity interpretation: <1.8 ft/sec, indicate of risk for recurrent falls General Gait Details: Assist for balance. Able to manage steering and direction of rollator.    Stairs             Wheelchair Mobility    Modified Rankin (Stroke Patients Only)       Balance Overall balance assessment: Needs assistance Sitting-balance support: No upper extremity supported;Feet supported Sitting balance-Leahy Scale: Good     Standing balance support: Bilateral upper  extremity supported;During functional activity Standing balance-Leahy Scale: Poor Standing balance comment: UE support and min guard for static standing                            Cognition Arousal/Alertness: Awake/alert Behavior During Therapy: WFL for tasks assessed/performed Overall Cognitive Status: Impaired/Different from baseline Area of Impairment: Attention;Safety/judgement;Awareness;Memory                   Current Attention Level: Focused Memory: Decreased short-term memory   Safety/Judgement: Decreased awareness of safety;Decreased awareness of deficits Awareness: Intellectual   General Comments: Follows 1 step commands consistently. Knows he lives at a facility but doesn't remember the name.       Exercises      General Comments General comments (skin integrity, edema, etc.): SpO2 98-100% on RA throughout      Pertinent Vitals/Pain Pain Assessment: No/denies pain    Home Living                      Prior Function            PT Goals (current goals can now be found in the care plan section) Acute Rehab PT Goals Patient Stated Goal: agreeable to work with therapy Progress towards PT goals: Progressing toward goals    Frequency    Min 2X/week      PT Plan Current plan remains appropriate    Co-evaluation              AM-PAC PT "6 Clicks" Mobility   Outcome Measure  Help needed turning from your back to your side while  in a flat bed without using bedrails?: None Help needed moving from lying on your back to sitting on the side of a flat bed without using bedrails?: None Help needed moving to and from a bed to a chair (including a wheelchair)?: A Little Help needed standing up from a chair using your arms (e.g., wheelchair or bedside chair)?: A Little Help needed to walk in hospital room?: A Little Help needed climbing 3-5 steps with a railing? : A Lot 6 Click Score: 19    End of Session Equipment Utilized  During Treatment: Gait belt Activity Tolerance: Patient tolerated treatment well Patient left: in chair;with call bell/phone within reach;with chair alarm set Nurse Communication: Mobility status PT Visit Diagnosis: Unsteadiness on feet (R26.81);Difficulty in walking, not elsewhere classified (R26.2)     Time: 1310-1330 PT Time Calculation (min) (ACUTE ONLY): 20 min  Charges:  $Gait Training: 8-22 mins                     [place] Pager [phone] Office [place] 06/28/2019, 2:59 PM

## 2019-06-29 LAB — CBC WITH DIFFERENTIAL/PLATELET
Abs Immature Granulocytes: 0.03 10*3/uL (ref 0.00–0.07)
Basophils Absolute: 0 10*3/uL (ref 0.0–0.1)
Basophils Relative: 0 %
Eosinophils Absolute: 0 10*3/uL (ref 0.0–0.5)
Eosinophils Relative: 0 %
HCT: 44.7 % (ref 39.0–52.0)
Hemoglobin: 15.2 g/dL (ref 13.0–17.0)
Immature Granulocytes: 1 %
Lymphocytes Relative: 32 %
Lymphs Abs: 1.4 10*3/uL (ref 0.7–4.0)
MCH: 28.4 pg (ref 26.0–34.0)
MCHC: 34 g/dL (ref 30.0–36.0)
MCV: 83.6 fL (ref 80.0–100.0)
Monocytes Absolute: 0.4 10*3/uL (ref 0.1–1.0)
Monocytes Relative: 9 %
Neutro Abs: 2.6 10*3/uL (ref 1.7–7.7)
Neutrophils Relative %: 58 %
Platelets: 157 10*3/uL (ref 150–400)
RBC: 5.35 MIL/uL (ref 4.22–5.81)
RDW: 14.5 % (ref 11.5–15.5)
WBC: 4.4 10*3/uL (ref 4.0–10.5)
nRBC: 0 % (ref 0.0–0.2)

## 2019-06-29 LAB — COMPREHENSIVE METABOLIC PANEL
ALT: 17 U/L (ref 0–44)
AST: 26 U/L (ref 15–41)
Albumin: 3.3 g/dL — ABNORMAL LOW (ref 3.5–5.0)
Alkaline Phosphatase: 81 U/L (ref 38–126)
Anion gap: 11 (ref 5–15)
BUN: 43 mg/dL — ABNORMAL HIGH (ref 8–23)
CO2: 21 mmol/L — ABNORMAL LOW (ref 22–32)
Calcium: 8.3 mg/dL — ABNORMAL LOW (ref 8.9–10.3)
Chloride: 107 mmol/L (ref 98–111)
Creatinine, Ser: 1.46 mg/dL — ABNORMAL HIGH (ref 0.61–1.24)
GFR calc Af Amer: 46 mL/min — ABNORMAL LOW (ref >60)
GFR calc non Af Amer: 40 mL/min — ABNORMAL LOW (ref >60)
Glucose, Bld: 141 mg/dL — ABNORMAL HIGH (ref 70–99)
Potassium: 3.9 mmol/L (ref 3.5–5.1)
Sodium: 139 mmol/L (ref 135–145)
Total Bilirubin: 0.5 mg/dL (ref 0.3–1.2)
Total Protein: 6.9 g/dL (ref 6.5–8.1)

## 2019-06-29 LAB — GLUCOSE, CAPILLARY
Glucose-Capillary: 131 mg/dL — ABNORMAL HIGH (ref 70–99)
Glucose-Capillary: 91 mg/dL (ref 70–99)
Glucose-Capillary: 96 mg/dL (ref 70–99)
Glucose-Capillary: 93 mg/dL (ref 70–99)

## 2019-06-29 LAB — MAGNESIUM: Magnesium: 2.1 mg/dL (ref 1.7–2.4)

## 2019-06-29 LAB — C-REACTIVE PROTEIN: CRP: 0.8 mg/dL (ref <1.0)

## 2019-06-29 LAB — BRAIN NATRIURETIC PEPTIDE: B Natriuretic Peptide: 207.4 pg/mL — ABNORMAL HIGH (ref 0.0–100.0)

## 2019-06-29 LAB — D-DIMER, QUANTITATIVE: D-Dimer, Quant: 2.34 ug/mL-FEU — ABNORMAL HIGH (ref 0.00–0.50)

## 2019-06-29 NOTE — Progress Notes (Signed)
Occupational Therapy Treatment Patient Details Name: Leonard [name] MRN: [id number] DOB: 09/13/1920 Today's Date: 06/29/2019    History of present illness 86 y.o.malewith medical history significant ofremote prostate CA s/p radiation therapy; HTN; chronic diastolic CHF; CAD s/p CABG; and DM presenting with weakness in the setting of known COVID-19 infection. Admitted with Acute Covid 19 Viral Pneumonitis.   OT comments  Pt progressing towards established OT goals. Pt performing functional mobility to bathroom with Min A and single hand held A. Pt performing grooming at sink with Min guard A and LB bathing with Min A for balance. Pt presenting with several moments of posterior LOB and required Min A for fall prevention. SpO2 100-97% on RA. Continue to recommend dc to ALF with HHOT and feel pt would benefit from aide for increased supervision with BADLs as pt presents with decreased balance and strength. Will continue to follow acutely as admitted.    Follow Up Recommendations  Home health OT;Supervision/Assistance - 24 hour;Other (comment)(aide; rec close to 24hr initially,back to ALF if able)    Equipment Recommendations  None recommended by OT    Recommendations for Other Services      Precautions / Restrictions Precautions Precautions: Fall Restrictions Weight Bearing Restrictions: No       Mobility Bed Mobility               General bed mobility comments: Pt sitting in recliner upon arrival  Transfers Overall transfer level: Needs assistance Equipment used: 1 person hand held assist Transfers: Sit to/from Stand Sit to Stand: Min assist         General transfer comment: Min A for balance and single hand held A    Balance Overall balance assessment: Needs assistance Sitting-balance support: No upper extremity supported;Feet supported Sitting balance-Leahy Scale: Good     Standing balance support: Bilateral upper extremity supported;During functional  activity Standing balance-Leahy Scale: Poor Standing balance comment: UE support and min guard for static standing. Requiring atleast Min A for preventing LOB posteriorly                           ADL either performed or assessed with clinical judgement   ADL Overall ADL's : Needs assistance/impaired     Grooming: Min guard;Oral care;Wash/dry hands;Wash/dry face;Standing Grooming Details (indicate cue type and reason): Min Guard A for safety while standing at the sink. Pt with slight posterior lean while standing     Lower Body Bathing: Minimal assistance;Sit to/from stand Lower Body Bathing Details (indicate cue type and reason): Pt with posterior lean and several moments of LOB. Requiring Min A to maintain balance and prevent falls. Pt presenting with poor safety.          Toilet Transfer: Min guard;Ambulation;Grab bars Toilet Transfer Details (indicate cue type and reason): Min Guard A for safety.         Functional mobility during ADLs: Minimal assistance(single hand held A) General ADL Comments: Pt presenting with poor balance during ADLs.      Vision       Perception     Praxis      Cognition Arousal/Alertness: Awake/alert Behavior During Therapy: WFL for tasks assessed/performed Overall Cognitive Status: Impaired/Different from baseline Area of Impairment: Attention;Safety/judgement;Awareness;Memory                   Current Attention Level: Sustained Memory: Decreased short-term memory   Safety/Judgement: Decreased awareness of safety;Decreased awareness of deficits Awareness: Intellectual  General Comments: Pt following commands and sustaining attention to perform BADLs at sink. Cues for transitioning between activity and for safety        Exercises     Shoulder Instructions       General Comments SpO2 98-100% on RA throughout    Pertinent Vitals/ Pain       Pain Assessment: No/denies pain  Home Living                                           Prior Functioning/Environment              Frequency  Min 2X/week        Progress Toward Goals  OT Goals(current goals can now be found in the care plan section)  Progress towards OT goals: Progressing toward goals  Acute Rehab OT Goals Patient Stated Goal: agreeable to work with therapy OT Goal Formulation: With patient Time For Goal Achievement: 07/11/19 Potential to Achieve Goals: Good ADL Goals Pt Will Perform Grooming: with modified independence;standing Pt Will Perform Lower Body Bathing: with modified independence;sit to/from stand Pt Will Perform Upper Body Dressing: with modified independence;sitting Pt Will Perform Lower Body Dressing: with modified independence;sit to/from stand Pt Will Transfer to Toilet: with modified independence;ambulating Pt Will Perform Toileting - Clothing Manipulation and hygiene: with modified independence;sit to/from stand  Plan Discharge plan remains appropriate    Co-evaluation                 AM-PAC OT "6 Clicks" Daily Activity     Outcome Measure   Help from another person eating meals?: A Little Help from another person taking care of personal grooming?: A Little Help from another person toileting, which includes using toliet, bedpan, or urinal?: A Little Help from another person bathing (including washing, rinsing, drying)?: A Little Help from another person to put on and taking off regular upper body clothing?: A Little Help from another person to put on and taking off regular lower body clothing?: A Little 6 Click Score: 18    End of Session Equipment Utilized During Treatment: Gait belt;Rolling walker  OT Visit Diagnosis: Unsteadiness on feet (R26.81);Muscle weakness (generalized) (M62.81);Other symptoms and signs involving cognitive function   Activity Tolerance Patient tolerated treatment well   Patient Left in bed;with call bell/phone within reach;with bed alarm set    Nurse Communication Mobility status        Time: 1443-1540 OT Time Calculation (min): 33 min  Charges: OT General Charges $OT Visit: 1 Visit OT Treatments $Self Care/Home Management : 23-37 mins  [place], OTR/L Acute Rehab Pager: [phone] Office: [place] 06/29/2019, 11:07 AM

## 2019-06-29 NOTE — Progress Notes (Signed)
PROGRESS NOTE                                                                                                                                                                                                             Patient Demographics:    Leonard [name], is a 86 y.o. male, DOB - 1920/09/14, KGU:[id number]  Outpatient Primary MD for the patient is Hoyt Koch, MD    LOS - 3  Admit date - 06/26/2019    Chief Complaint  Patient presents with  . Fatigue    Covid+       Brief Narrative - Leonard [name] is a 86 y.o. male with medical history significant of remote prostate CA s/p radiation therapy; HTN; chronic diastolic CHF; CAD s/p CABG; and DM presenting with weakness in the setting of known COVID-19 infection.  The patient is pleasant and conversant, in NAD.   Subjective:   Patient in chair, no headache, no chest pain no SOB.   Assessment  & Plan :     1.  Acute Covid 19 Viral Pneumonitis during the ongoing 2020 Covid 19 Pandemic - he has mild, he is on steroids and remdesivir, he seems to be doing fairly well.  Have started to taper steroids, he is doing fairly well and currently on room air however he does not have arrangements for go to the outpatient clinic to complete his remdesivir infusion hence he will stay here.  Encouraged the patient to sit up in chair in the daytime use I-S and flutter valve for pulmonary toiletry and then prone in bed when at night.   SpO2: 100 %  Recent Labs  Lab 06/26/19 0952 06/27/19 0334 06/28/19 0540 06/29/19 0517  CRP 1.4* 2.0* 1.3* 0.8  DDIMER 5.62* 3.48* 3.05* 2.34*  FERRITIN 190  --   --   --   BNP  --  381.0* 268.2* 207.4*  PROCALCITON <0.10  --   --   --     Hepatic Function Latest Ref Rng & Units 06/29/2019 06/28/2019 06/27/2019  Total Protein 6.5 - 8.1 g/dL 6.9 7.4 6.7  Albumin 3.5 - 5.0 g/dL 3.3(L) 3.6 3.1(L)  AST 15 - 41 U/L 26 28 22   ALT 0 - 44 U/L 17  16 16   Alk Phosphatase 38 - 126 U/L 81 91 79  Total Bilirubin  0.3 - 1.2 mg/dL 0.5 0.8 1.1  Bilirubin, Direct 0.0 - 0.3 mg/dL - - -    2.  Elevated D-dimer due to inflammation.  Responding well to heparin continue.  3.  Dehydration induced AKI.  Resolved after IV fluids.  He has underlying CKD 3 and creatinine now back to his baseline of around 1.3.  Continue to hold ARB.  4.  Essential hypertension.  Currently on Norvasc will add hydralazine for better control.  5.  History of prostate cancer.  Continue Myrbetriq and Flomax combination.  Follow with PCP outpatient for discharge.  6.  CAD s/p CABG.  No acute issues.  Continue long-acting nitrate.  Resume home dose aspirin.  7.  Mild delirium hospital-acquired.  Mild hospital-acquired encephalopathy due to acute illness and unfamiliar settings, as needed Haldol.  Minimize narcotics and benzodiazepines.  Family updated about it.  8. DM type II.  On Lantus and sliding scale, have increased dose for better control.  Poor outpt control due to Hyperglycemia, will order education.  Lab Results  Component Value Date   HGBA1C 8.7 (H) 06/27/2019   CBG (last 3)  Recent Labs    06/28/19 1625 06/28/19 2122 06/29/19 0742  GLUCAP 189* 140* 131*     Condition - Fair  Family Communication  :  Daughter 06/27/19, 10/21  Code Status :   DNR  Diet :   Diet Order            Diet heart healthy/carb modified Room service appropriate? Yes; Fluid consistency: Thin  Diet effective now               Disposition Plan  :  ALF once he finishes his remdesivir  Consults  :  None  Procedures  :    PUD Prophylaxis :   DVT Prophylaxis  :    Heparin   Lab Results  Component Value Date   PLT 157 06/29/2019    Inpatient Medications  Scheduled Meds: . amLODipine  10 mg Oral Daily  . aspirin  81 mg Oral Daily  . dexamethasone  2 mg Oral Q24H  . gabapentin  300 mg Oral BID  . heparin injection (subcutaneous)  5,000 Units Subcutaneous Q8H   . hydrALAZINE  50 mg Oral Q8H  . insulin aspart  0-15 Units Subcutaneous TID WC  . insulin aspart  0-5 Units Subcutaneous QHS  . insulin glargine  25 Units Subcutaneous Daily  . isosorbide dinitrate  10 mg Oral TID  . latanoprost  1 drop Both Eyes QHS  . mirabegron ER  25 mg Oral Daily  . tamsulosin  0.4 mg Oral Daily   Continuous Infusions: . remdesivir 100 mg in NS 100 mL 100 mg (06/28/19 0845)   PRN Meds:.acetaminophen, albuterol, bisacodyl, chlorpheniramine-HYDROcodone, guaiFENesin-dextromethorphan, haloperidol lactate, [DISCONTINUED] ondansetron **OR** ondansetron (ZOFRAN) IV, polyethylene glycol, sodium phosphate  Antibiotics  :    Anti-infectives (From admission, onward)   Start     Dose/Rate Route Frequency Ordered Stop   06/27/19 1000  remdesivir 100 mg in sodium chloride 0.9 % 100 mL IVPB     100 mg 200 mL/hr over 30 Minutes Intravenous Daily 06/26/19 1209 07/01/19 0959   06/26/19 1300  remdesivir 200 mg in sodium chloride 0.9% 250 mL IVPB     200 mg 580 mL/hr over 30 Minutes Intravenous Once 06/26/19 1209 06/26/19 1407       Time Spent in minutes  30   Lala Lund M.D on 06/29/2019 at 9:13 AM  To  page go to [email or web address] - password [email or web address]  [facility] -  Office  [phone]   See all Orders from today for further details    Objective:   Vitals:   06/28/19 1558 06/28/19 2100 06/29/19 0500 06/29/19 0743  BP: 122/63 (!) 114/92 (!) 148/58 (!) 161/71  Pulse: 62 88 74 (!) 58  Resp: 18 18 18 18   Temp: 97.8 F (36.6 C) 98.4 F (36.9 C) 97.9 F (36.6 C) 97.7 F (36.5 C)  TempSrc: Oral Axillary Axillary Oral  SpO2: 99% 100% 96% 100%  Weight:      Height:        Wt Readings from Last 3 Encounters:  06/26/19 62.6 kg  07/19/17 68 kg  05/22/17 68.5 kg     Intake/Output Summary (Last 24 hours) at 06/29/2019 0913 Last data filed at 06/28/2019 1627 Gross per 24 hour  Intake 360 ml  Output --  Net 360 ml     Physical Exam  Awake, mildly  confused, No new F.N deficits,   [place].AT,PERRAL Supple Neck,No JVD, No cervical lymphadenopathy appriciated.  Symmetrical Chest wall movement, Good air movement bilaterally, CTAB RRR,No Gallops, Rubs or new Murmurs, No Parasternal Heave +ve B.Sounds, Abd Soft, No tenderness, No organomegaly appriciated, No rebound - guarding or rigidity. No Cyanosis, Clubbing or edema,      Data Review:    CBC Recent Labs  Lab 06/26/19 0952 06/27/19 0334 06/28/19 0540 06/29/19 0517  WBC 2.8* 1.5* 3.9* 4.4  HGB 14.6 14.9 15.8 15.2  HCT 45.3 44.4 47.2 44.7  PLT 100* 127* 150 157  MCV 86.5 84.3 82.8 83.6  MCH 27.9 28.3 27.7 28.4  MCHC 32.2 33.6 33.5 34.0  RDW 14.5 14.4 14.5 14.5  LYMPHSABS 1.0 0.7 1.1 1.4  MONOABS 0.2 0.1 0.2 0.4  EOSABS 0.0 0.0 0.0 0.0  BASOSABS 0.0 0.0 0.0 0.0    Chemistries  Recent Labs  Lab 06/26/19 0952 06/27/19 0334 06/28/19 0540 06/29/19 0517  NA 134* 132* 138 139  K 3.8 4.2 3.8 3.9  CL 102 102 102 107  CO2 21* 18* 22 21*  GLUCOSE 212* 371* 205* 141*  BUN 21 31* 36* 43*  CREATININE 1.58* 1.34* 1.42* 1.46*  CALCIUM 7.9* 7.8* 8.4* 8.3*  MG  --  2.0 2.1 2.1  AST 21 22 28 26   ALT 13 16 16 17   ALKPHOS 90 79 91 81  BILITOT 1.0 1.1 0.8 0.5   ------------------------------------------------------------------------------------------------------------------ Recent Labs    06/26/19 0952  TRIG 98    Lab Results  Component Value Date   HGBA1C 8.7 (H) 06/27/2019   ------------------------------------------------------------------------------------------------------------------ No results for input(s): TSH, T4TOTAL, T3FREE, THYROIDAB in the last 72 hours.  Invalid input(s): FREET3  Cardiac Enzymes No results for input(s): CKMB, TROPONINI, MYOGLOBIN in the last 168 hours.  Invalid input(s): CK ------------------------------------------------------------------------------------------------------------------    Component Value Date/Time   BNP 207.4 (H)  06/29/2019 1624    Micro Results Recent Results (from the past 240 hour(s))  Blood Culture (routine x 2)     Status: None (Preliminary result)   Collection Time: 06/26/19  7:53 AM   Specimen: BLOOD  Result Value Ref Range Status   Specimen Description   Final    BLOOD LEFT ANTECUBITAL Performed at [facility] [address], [place], [place] [address]    Special Requests   Final    BOTTLES DRAWN AEROBIC AND ANAEROBIC Blood Culture results may not be optimal due to an excessive volume of blood received in  culture bottles Performed at [facility] [address] [place], [place] [address]    Culture   Final    NO GROWTH 2 DAYS Performed at [facility] [address] [place], [place] [address]    Report Status PENDING  Incomplete  Blood Culture (routine x 2)     Status: None (Preliminary result)   Collection Time: 06/26/19  9:52 AM   Specimen: BLOOD  Result Value Ref Range Status   Specimen Description   Final    BLOOD BLOOD RIGHT FOREARM Performed at [facility] [address], [place], [place] [address]    Special Requests   Final    BOTTLES DRAWN AEROBIC AND ANAEROBIC Blood Culture results may not be optimal due to an excessive volume of blood received in culture bottles Performed at [facility] [address], [place], [place] [address]    Culture   Final    NO GROWTH 2 DAYS Performed at [facility] [address], [place], [place] [address]    Report Status PENDING  Incomplete    Radiology Reports DG Chest Port 1 View  Result Date: 06/26/2019 CLINICAL DATA:  COVID.  History of fever and fatigue. EXAM: PORTABLE CHEST 1 VIEW COMPARISON:  05/22/2017. FINDINGS: Prior CABG. Cardiomegaly. Very mild pulmonary venous congestion cannot be excluded. Very mild bilateral interstitial prominence noted. Mild CHF and or pneumonitis cannot be excluded. No pleural effusion or  pneumothorax. No acute bony abnormality identified. IMPRESSION: Prior CABG. Cardiomegaly. Very mild pulmonary venous congestion. Very mild bilateral interstitial prominence noted. Mild CHF and or pneumonitis cannot be excluded in this known COVID positive patient. Electronically Signed   By: Marcello Moores  Register   On: 06/26/2019 10:35

## 2019-06-29 NOTE — Plan of Care (Signed)
  Problem: Respiratory: Goal: Will maintain a patent airway Outcome: Progressing Goal: Complications related to the disease process, condition or treatment will be avoided or minimized Outcome: Progressing   

## 2019-06-29 NOTE — Progress Notes (Signed)
Referral received for insulin teaching. Discussed with RN and he states that patient is confused.  Patient lives in assisted living.  Called daughter and she confirms that the assisted Living gives her father his insulin and he does not perform this task independently.  Thanks,  Adah Perl, RN, BC-ADM Inpatient Diabetes Coordinator Pager [phone] (8a-5p)

## 2019-06-29 NOTE — Progress Notes (Signed)
Pt declined ambulation.  

## 2019-06-30 LAB — CBC WITH DIFFERENTIAL/PLATELET
Abs Immature Granulocytes: 0.06 10*3/uL (ref 0.00–0.07)
Basophils Absolute: 0 10*3/uL (ref 0.0–0.1)
Basophils Relative: 1 %
Eosinophils Absolute: 0 10*3/uL (ref 0.0–0.5)
Eosinophils Relative: 0 %
HCT: 44.9 % (ref 39.0–52.0)
Hemoglobin: 15.3 g/dL (ref 13.0–17.0)
Immature Granulocytes: 1 %
Lymphocytes Relative: 37 %
Lymphs Abs: 1.6 10*3/uL (ref 0.7–4.0)
MCH: 28.4 pg (ref 26.0–34.0)
MCHC: 34.1 g/dL (ref 30.0–36.0)
MCV: 83.5 fL (ref 80.0–100.0)
Monocytes Absolute: 0.4 10*3/uL (ref 0.1–1.0)
Monocytes Relative: 10 %
Neutro Abs: 2.3 10*3/uL (ref 1.7–7.7)
Neutrophils Relative %: 51 %
Platelets: 154 10*3/uL (ref 150–400)
RBC: 5.38 MIL/uL (ref 4.22–5.81)
RDW: 14.7 % (ref 11.5–15.5)
WBC: 4.4 10*3/uL (ref 4.0–10.5)
nRBC: 0 % (ref 0.0–0.2)

## 2019-06-30 LAB — GLUCOSE, CAPILLARY
Glucose-Capillary: 102 mg/dL — ABNORMAL HIGH (ref 70–99)
Glucose-Capillary: 224 mg/dL — ABNORMAL HIGH (ref 70–99)

## 2019-06-30 LAB — COMPREHENSIVE METABOLIC PANEL
ALT: 17 U/L (ref 0–44)
AST: 36 U/L (ref 15–41)
Albumin: 3.2 g/dL — ABNORMAL LOW (ref 3.5–5.0)
Alkaline Phosphatase: 78 U/L (ref 38–126)
Anion gap: 10 (ref 5–15)
BUN: 51 mg/dL — ABNORMAL HIGH (ref 8–23)
CO2: 21 mmol/L — ABNORMAL LOW (ref 22–32)
Calcium: 7.9 mg/dL — ABNORMAL LOW (ref 8.9–10.3)
Chloride: 108 mmol/L (ref 98–111)
Creatinine, Ser: 1.67 mg/dL — ABNORMAL HIGH (ref 0.61–1.24)
GFR calc Af Amer: 39 mL/min — ABNORMAL LOW (ref >60)
GFR calc non Af Amer: 34 mL/min — ABNORMAL LOW (ref >60)
Glucose, Bld: 69 mg/dL — ABNORMAL LOW (ref 70–99)
Potassium: 3.8 mmol/L (ref 3.5–5.1)
Sodium: 139 mmol/L (ref 135–145)
Total Bilirubin: 0.5 mg/dL (ref 0.3–1.2)
Total Protein: 6.4 g/dL — ABNORMAL LOW (ref 6.5–8.1)

## 2019-06-30 LAB — MAGNESIUM: Magnesium: 2.4 mg/dL (ref 1.7–2.4)

## 2019-06-30 LAB — D-DIMER, QUANTITATIVE: D-Dimer, Quant: 1.85 ug/mL-FEU — ABNORMAL HIGH (ref 0.00–0.50)

## 2019-06-30 LAB — C-REACTIVE PROTEIN: CRP: 0.7 mg/dL (ref <1.0)

## 2019-06-30 LAB — BRAIN NATRIURETIC PEPTIDE: B Natriuretic Peptide: 165.6 pg/mL — ABNORMAL HIGH (ref 0.0–100.0)

## 2019-06-30 MED ORDER — DEXTROSE 50 % IV SOLN
12.5000 g | Freq: Once | INTRAVENOUS | Status: AC
  Administered 2019-06-30: 10:00:00 12.5 g via INTRAVENOUS
  Filled 2019-06-30: qty 50

## 2019-06-30 NOTE — Discharge Summary (Addendum)
Leonard [name] HRC:[id number] DOB: 1920/11/19 DOA: 06/26/2019  PCP: Hoyt Koch, MD  Admit date: 06/26/2019  Discharge date: 06/30/2019  Admitted From: ALF  Disposition:  ALF   Recommendations for Outpatient Follow-up:   Follow up with PCP in 1-2 weeks  PCP Please obtain BMP/CBC, 2 view CXR in 1week,  (see Discharge instructions)   PCP Please follow up on the following pending results:    Home Health: PT,RN,Aide   Equipment/Devices: None  Consultations: None  Discharge Condition: Stable    CODE STATUS: DNR    Diet Recommendation: Heart Healthy Low Carb  Diet Order            Diet heart healthy/carb modified Room service appropriate? Yes; Fluid consistency: Thin  Diet effective now               Chief Complaint  Patient presents with  . Fatigue    Covid+     Brief history of present illness from the day of admission and additional interim summary    Leonard [name] a 86 y.o.malewith medical history significant ofremote prostate CA s/p radiation therapy; HTN; chronic diastolic CHF; CAD s/p CABG; and DM presenting with weakness in the setting of known COVID-19 infection.The patient is pleasant and conversant, in NAD.                                                                 Hospital Course   1.  Acute Covid 19 Viral Pneumonitis during the ongoing 2020 Covid 19 Pandemic - he had mild, he was treated with IV steroids and remdesivir, stayed symptom-free on room air, finishes his course today and then will be discharged back to ALF.  SpO2: 100 %  Recent Labs  Lab 06/26/19 0952 06/27/19 0334 06/28/19 0540 06/29/19 0517 06/30/19 0621  CRP 1.4* 2.0* 1.3* 0.8 0.7  DDIMER 5.62* 3.48* 3.05* 2.34* 1.85*  FERRITIN 190  --   --   --   --   BNP  --  381.0* 268.2* 207.4* 165.6*    PROCALCITON <0.10  --   --   --   --     Hepatic Function Latest Ref Rng & Units 06/30/2019 06/29/2019 06/28/2019  Total Protein 6.5 - 8.1 g/dL 6.4(L) 6.9 7.4  Albumin 3.5 - 5.0 g/dL 3.2(L) 3.3(L) 3.6  AST 15 - 41 U/L 36 26 28  ALT 0 - 44 U/L _0 Alk Phosphatase 38 - 126 U/L 78 81 91  Total Bilirubin 0.3 - 1.2 mg/dL 0.5 0.5 0.8  Bilirubin, Direct 0.0 - 0.3 mg/dL - - -     2.  Elevated D-dimer due to inflammation.  Responded well to prophylactic dose heparin, now continue home dose aspirin.  3.  Dehydration induced AKI.  Resolved after IV fluids.  He has underlying CKD 3 and creatinine now back to his baseline of around 1.3.    4.  Essential hypertension.    Continue home regimen upon discharge..  5.  History of prostate cancer.  Continue Myrbetriq and Flomax combination.  Follow with PCP outpatient for discharge.  6.  CAD s/p CABG.  No acute issues.  Continue long-acting nitrate.  ResumeD home dose aspirin.  7.  Mild delirium hospital-acquired.  Mild hospital-acquired encephalopathy due to acute illness and unfamiliar settings, as needed Haldol.  Minimize narcotics and benzodiazepines.  Family updated about it.  8. DM type II.    Continue home regimen, diabetic educator provided him with diabetic and insulin education, A1c borderline acceptable due to his age.  PCP to monitor.  Lab Results  Component Value Date   HGBA1C 8.7 (H) 06/27/2019      Discharge diagnosis     Principal Problem:   Leonard [name] virus detected Active Problems:   Diabetes ([place])   Hyperlipidemia   Essential hypertension   History of prostate cancer   Chronic renal insufficiency, stage III (moderate)    Discharge instructions    Discharge Instructions    Discharge instructions   Complete by: As directed    Follow with Primary MD Hoyt Koch, MD in 7 days   Get CBC, CMP, 2 view Chest X ray -  checked next visit within 1 week by Primary MD    Activity: As tolerated with Full  fall precautions use walker/cane & assistance as needed  Disposition Home    Diet: Heart Healthy  Low Carb  Accuchecks 4 times/day, Once in AM empty stomach and then before each meal. Log in all results and show them to your Prim.MD in 3 days. If any glucose reading is under 80 or above 300 call your Prim MD immidiately. Follow Low glucose instructions for glucose under 80 as instructed.   Special Instructions: If you have smoked or chewed Tobacco  in the last 2 yrs please stop smoking, stop any regular Alcohol  and or any Recreational drug use.  On your next visit with your primary care physician please Get Medicines reviewed and adjusted.  Please request your Prim.MD to go over all Hospital Tests and Procedure/Radiological results at the follow up, please get all Hospital records sent to your Prim MD by signing hospital release before you go home.  If you experience worsening of your admission symptoms, develop shortness of breath, life threatening emergency, suicidal or homicidal thoughts you must seek medical attention immediately by calling 911 or calling your MD immediately  if symptoms less severe.  You Must read complete instructions/literature along with all the possible adverse reactions/side effects for all the Medicines you take and that have been prescribed to you. Take any new Medicines after you have completely understood and accpet all the possible adverse reactions/side effects.   Increase activity slowly   Complete by: As directed    MyChart COVID-19 home monitoring program   Complete by: Jun 30, 2019    Is the patient willing to use the [place] for home monitoring?: Yes   Temperature monitoring   Complete by: Jun 30, 2019    After how many days would you like to receive a notification of this patient's flowsheet entries?: 1      Discharge Medications   Allergies as of 06/30/2019      Reactions   Codeine Nausea And Vomiting, Other (See Comments)    hallucinations   Microzide [  hydrochlorothiazide] Other (See Comments)   Acute gout on LosartanHCT 12/2014   Ace Inhibitors Other (See Comments)    cough      Medication List    TAKE these medications   amLODipine 10 MG tablet Commonly known as: NORVASC TAKE 1 TABLET BY MOUTH EVERY DAY   aspirin 81 MG tablet Take 81 mg by mouth daily.   Basaglar KwikPen 100 UNIT/ML Sopn Inject 9 Units into the skin daily.   diclofenac sodium 1 % Gel Commonly known as: VOLTAREN Apply 2 g topically 2 (two) times daily as needed. What changed:   when to take this  additional instructions   gabapentin 600 MG tablet Commonly known as: NEURONTIN Take 600 mg by mouth 3 (three) times daily.   insulin lispro 100 UNIT/ML injection Commonly known as: HUMALOG Inject 0-7 Units into the skin 3 (three) times daily before meals. 0-180 0 units 181-240 3 units 241-300 4 units 301-360 5 units 361-420 6 units 421+ 7 units   Insulin Pen Needle 32G X 4 MM Misc As directed per insulin dose and sliding scale   isosorbide dinitrate 10 MG tablet Commonly known as: ISORDIL Take 1 tablet (10 mg total) by mouth 3 (three) times daily.   latanoprost 0.005 % ophthalmic solution Commonly known as: XALATAN Place 1 drop into both eyes at bedtime.   losartan 100 MG tablet Commonly known as: COZAAR Take 1 tablet (100 mg total) by mouth daily. Overdue for annual appt w/labs must see MD for refills   meclizine 12.5 MG tablet Commonly known as: ANTIVERT Take 12.5 mg by mouth 3 (three) times daily as needed for dizziness.   Myrbetriq 25 MG Tb24 tablet Generic drug: mirabegron ER Take 25 mg by mouth daily.   Omega 3 1200 MG Caps Take 1,200 mg by mouth 2 (two) times daily.   tamsulosin 0.4 MG Caps capsule Commonly known as: FLOMAX Take 0.4 mg by mouth daily.   Vitamin D3 125 MCG (5000 UT) Caps Take 5,000 Units by mouth every 14 (fourteen) days.       Follow-up Information    Hoyt Koch,  MD. Schedule an appointment as soon as possible for a visit in 1 week(s).   Specialty: Internal Medicine Contact information: [place] [address] [phone]           Major procedures and Radiology Reports - PLEASE review detailed and final reports thoroughly  -         DG Chest Port 1 View  Result Date: 06/26/2019 CLINICAL DATA:  COVID.  History of fever and fatigue. EXAM: PORTABLE CHEST 1 VIEW COMPARISON:  05/22/2017. FINDINGS: Prior CABG. Cardiomegaly. Very mild pulmonary venous congestion cannot be excluded. Very mild bilateral interstitial prominence noted. Mild CHF and or pneumonitis cannot be excluded. No pleural effusion or pneumothorax. No acute bony abnormality identified. IMPRESSION: Prior CABG. Cardiomegaly. Very mild pulmonary venous congestion. Very mild bilateral interstitial prominence noted. Mild CHF and or pneumonitis cannot be excluded in this known COVID positive patient. Electronically Signed   By: Marcello Moores  Register   On: 06/26/2019 10:35    Micro Results     Recent Results (from the past 240 hour(s))  Blood Culture (routine x 2)     Status: None (Preliminary result)   Collection Time: 06/26/19  7:53 AM   Specimen: BLOOD  Result Value Ref Range Status   Specimen Description   Final    BLOOD LEFT ANTECUBITAL Performed at [facility], [address] [place], [place] [address]    Special Requests   Final    BOTTLES DRAWN AEROBIC AND ANAEROBIC Blood Culture results may not be optimal due to an excessive volume of blood received in culture bottles Performed at [facility] [address] [place], [place] [address]    Culture   Final    NO GROWTH 4 DAYS Performed at [facility], [place] [address] [place], [place] [address]    Report Status PENDING  Incomplete  Blood Culture (routine x 2)     Status: None (Preliminary result)   Collection Time: 06/26/19  9:52 AM   Specimen: BLOOD  Result  Value Ref Range Status   Specimen Description   Final    BLOOD BLOOD RIGHT FOREARM Performed at [facility] [address], [place], [place] [address]    Special Requests   Final    BOTTLES DRAWN AEROBIC AND ANAEROBIC Blood Culture results may not be optimal due to an excessive volume of blood received in culture bottles Performed at [facility] [address] [place], [place] [address]    Culture   Final    NO GROWTH 4 DAYS Performed at [facility], [place] [address], [place], [place] [address]    Report Status PENDING  Incomplete    Today   Subjective    Leonard [name] today has no headache,no chest abdominal pain,no new weakness tingling or numbness, feels much better wants to go home today.    Objective   Blood pressure 121/60, pulse 77, temperature 99 F (37.2 C), temperature source Oral, resp. rate 16, height _0  (1.676 m), weight 62.6 kg, SpO2 100 %.   Intake/Output Summary (Last 24 hours) at 06/30/2019 0907 Last data filed at 06/30/2019 0100 Gross per 24 hour  Intake 440 ml  Output --  Net 440 ml    Exam Awake , mildly confused, No new F.N deficits,  [place].AT,PERRAL Supple Neck,No JVD, No cervical lymphadenopathy appriciated.  Symmetrical Chest wall movement, Good air movement bilaterally, CTAB RRR,No Gallops,Rubs or new Murmurs, No Parasternal Heave +ve B.Sounds, Abd Soft, Non tender, No organomegaly appriciated, No rebound -guarding or rigidity. No Cyanosis, Clubbing or edema, No new Rash or bruise   Data Review   CBC w Diff:  Lab Results  Component Value Date   WBC 4.4 06/30/2019   HGB 15.3 06/30/2019   HCT 44.9 06/30/2019   PLT 154 06/30/2019   LYMPHOPCT 37 06/30/2019   MONOPCT 10 06/30/2019   EOSPCT 0 06/30/2019   BASOPCT 1 06/30/2019    CMP:  Lab Results  Component Value Date   NA 139 06/30/2019   K 3.8 06/30/2019   CL 108 06/30/2019   CO2 21 (L) 06/30/2019   BUN 51 (H) 06/30/2019    CREATININE 1.67 (H) 06/30/2019   PROT 6.4 (L) 06/30/2019   ALBUMIN 3.2 (L) 06/30/2019   BILITOT 0.5 06/30/2019   ALKPHOS 78 06/30/2019   AST 36 06/30/2019   ALT 17 06/30/2019  .   Total Time in preparing paper work, data evaluation and todays exam - 19 minutes  Lala Lund M.D on 06/30/2019 at 9:07 AM  [facility]   Office  [phone]

## 2019-06-30 NOTE — Discharge Instructions (Signed)
Follow with Primary MD Hoyt Koch, MD in 7 days   Get CBC, CMP, 2 view Chest X ray -  checked next visit within 1 week by Primary MD    Activity: As tolerated with Full fall precautions use walker/cane & assistance as needed  Disposition Home    Diet: Heart Healthy    Special Instructions: If you have smoked or chewed Tobacco  in the last 2 yrs please stop smoking, stop any regular Alcohol  and or any Recreational drug use.  On your next visit with your primary care physician please Get Medicines reviewed and adjusted.  Please request your Prim.MD to go over all Hospital Tests and Procedure/Radiological results at the follow up, please get all Hospital records sent to your Prim MD by signing hospital release before you go home.  If you experience worsening of your admission symptoms, develop shortness of breath, life threatening emergency, suicidal or homicidal thoughts you must seek medical attention immediately by calling 911 or calling your MD immediately  if symptoms less severe.  You Must read complete instructions/literature along with all the possible adverse reactions/side effects for all the Medicines you take and that have been prescribed to you. Take any new Medicines after you have completely understood and accpet all the possible adverse reactions/side effects.       Person Under Monitoring Name: Leonard [name]  Location: [place] [address]   Infection Prevention Recommendations for Individuals Confirmed to have, or Being Evaluated for, 2019 Novel Coronavirus (COVID-19) Infection Who Receive Care at Home  Individuals who are confirmed to have, or are being evaluated for, COVID-19 should follow the prevention steps below until a healthcare provider or local or state health department says they can return to normal activities.  Stay home except to get medical care You should restrict activities outside your home,  except for getting medical care. Do not go to work, school, or public areas, and do not use public transportation or taxis.  Call ahead before visiting your doctor Before your medical appointment, call the healthcare provider and tell them that you have, or are being evaluated for, COVID-19 infection. This will help the healthcare provider's office take steps to keep other people from getting infected. Ask your healthcare provider to call the local or state health department.  Monitor your symptoms Seek prompt medical attention if your illness is worsening (e.g., difficulty breathing). Before going to your medical appointment, call the healthcare provider and tell them that you have, or are being evaluated for, COVID-19 infection. Ask your healthcare provider to call the local or state health department.  Wear a facemask You should wear a facemask that covers your nose and mouth when you are in the same room with other people and when you visit a healthcare provider. People who live with or visit you should also wear a facemask while they are in the same room with you.  Separate yourself from other people in your home As much as possible, you should stay in a different room from other people in your home. Also, you should use a separate bathroom, if available.  Avoid sharing household items You should not share dishes, drinking glasses, cups, eating utensils, towels, bedding, or other items with other people in your home. After using these items, you should wash them thoroughly with soap and water.  Cover your coughs and sneezes Cover your mouth and nose with a tissue when you cough or sneeze, or  you can cough or sneeze into your sleeve. Throw used tissues in a lined trash can, and immediately wash your hands with soap and water for at least 20 seconds or use an alcohol-based hand rub.  Wash your [facility] your hands often and thoroughly with soap and water for at least 20 seconds.  You can use an alcohol-based hand sanitizer if soap and water are not available and if your hands are not visibly dirty. Avoid touching your eyes, nose, and mouth with unwashed hands.   Prevention Steps for Caregivers and Household Members of Individuals Confirmed to have, or Being Evaluated for, COVID-19 Infection Being Cared for in the Home  If you live with, or provide care at home for, a person confirmed to have, or being evaluated for, COVID-19 infection please follow these guidelines to prevent infection:  Follow healthcare provider's instructions Make sure that you understand and can help the patient follow any healthcare provider instructions for all care.  Provide for the patient's basic needs You should help the patient with basic needs in the home and provide support for getting groceries, prescriptions, and other personal needs.  Monitor the patient's symptoms If they are getting sicker, call his or her medical provider and tell them that the patient has, or is being evaluated for, COVID-19 infection. This will help the healthcare provider's office take steps to keep other people from getting infected. Ask the healthcare provider to call the local or state health department.  Limit the number of people who have contact with the patient  If possible, have only one caregiver for the patient.  Other household members should stay in another home or place of residence. If this is not possible, they should stay  in another room, or be separated from the patient as much as possible. Use a separate bathroom, if available.  Restrict visitors who do not have an essential need to be in the home.  Keep older adults, very young children, and other sick people away from the patient Keep older adults, very young children, and those who have compromised immune systems or chronic health conditions away from the patient. This includes people with chronic heart, lung, or kidney conditions,  diabetes, and cancer.  Ensure good ventilation Make sure that shared spaces in the home have good air flow, such as from an air conditioner or an opened window, weather permitting.  Wash your hands often  Wash your hands often and thoroughly with soap and water for at least 20 seconds. You can use an alcohol based hand sanitizer if soap and water are not available and if your hands are not visibly dirty.  Avoid touching your eyes, nose, and mouth with unwashed hands.  Use disposable paper towels to dry your hands. If not available, use dedicated cloth towels and replace them when they become wet.  Wear a facemask and gloves  Wear a disposable facemask at all times in the room and gloves when you touch or have contact with the patient's blood, body fluids, and/or secretions or excretions, such as sweat, saliva, sputum, nasal mucus, vomit, urine, or feces.  Ensure the mask fits over your nose and mouth tightly, and do not touch it during use.  Throw out disposable facemasks and gloves after using them. Do not reuse.  Wash your hands immediately after removing your facemask and gloves.  If your personal clothing becomes contaminated, carefully remove clothing and launder. Wash your hands after handling contaminated clothing.  Place all used disposable facemasks,  gloves, and other waste in a lined container before disposing them with other household waste.  Remove gloves and wash your hands immediately after handling these items.  Do not share dishes, glasses, or other household items with the patient  Avoid sharing household items. You should not share dishes, drinking glasses, cups, eating utensils, towels, bedding, or other items with a patient who is confirmed to have, or being evaluated for, COVID-19 infection.  After the person uses these items, you should wash them thoroughly with soap and water.  Wash laundry thoroughly  Immediately remove and wash clothes or bedding that have  blood, body fluids, and/or secretions or excretions, such as sweat, saliva, sputum, nasal mucus, vomit, urine, or feces, on them.  Wear gloves when handling laundry from the patient.  Read and follow directions on labels of laundry or clothing items and detergent. In general, wash and dry with the warmest temperatures recommended on the label.  Clean all areas the individual has used often  Clean all touchable surfaces, such as counters, tabletops, doorknobs, bathroom fixtures, toilets, phones, keyboards, tablets, and bedside tables, every day. Also, clean any surfaces that may have blood, body fluids, and/or secretions or excretions on them.  Wear gloves when cleaning surfaces the patient has come in contact with.  Use a diluted bleach solution (e.g., dilute bleach with 1 part bleach and 10 parts water) or a household disinfectant with a label that says EPA-registered for coronaviruses. To make a bleach solution at home, add 1 tablespoon of bleach to 1 quart (4 cups) of water. For a larger supply, add  cup of bleach to 1 gallon (16 cups) of water.  Read labels of cleaning products and follow recommendations provided on product labels. Labels contain instructions for safe and effective use of the cleaning product including precautions you should take when applying the product, such as wearing gloves or eye protection and making sure you have good ventilation during use of the product.  Remove gloves and wash hands immediately after cleaning.  Monitor yourself for signs and symptoms of illness Caregivers and household members are considered close contacts, should monitor their health, and will be asked to limit movement outside of the home to the extent possible. Follow the monitoring steps for close contacts listed on the symptom monitoring form.   ? If you have additional questions, contact your local health department or call the epidemiologist on call at [phone] (available 24/7). ?  This guidance is subject to change. For the most up-to-date guidance from [facility], please refer to their website: [email or web address]

## 2019-06-30 NOTE — NC FL2 (Signed)
[place] MEDICAID FL2 LEVEL OF CARE SCREENING TOOL     IDENTIFICATION  Patient Name: Leonard [name] Birthdate: 1921/05/25 Sex: male Admission Date (Current Location): 06/26/2019  [facility] and [place] Number:  Herbalist and Address:  [facility] [place] [address] [place], [place] [address]      Provider Number: [id number]  Attending Physician Name and Address:  Thurnell Lose, MD  Relative Name and Phone Number:       Current Level of Care: Hospital Recommended Level of Care: [place] Prior Approval Number:    Date Approved/Denied:   PASRR Number:    Discharge Plan: Other (Comment)(ALF)    Current Diagnoses: Patient Active Problem List   Diagnosis Date Noted  . COVID-19 virus detected 06/26/2019  . Thrombocytopenia ([place]) 03/12/2017  . Mass of skin of right shoulder 03/12/2017  . Glaucoma 03/11/2017  . Hiatal hernia 03/11/2017  . Chest pain 03/11/2017  . Left leg weakness 06/29/2016  . Degenerative arthritis of left knee 05/25/2016  . Left knee pain 03/24/2016  . History of prostate cancer 06/12/2014  . Chronic renal insufficiency, stage III (moderate) 06/12/2014  . Non-compliant behavior 10/18/2013  . Abnormal computed tomography of pancreas or bile duct 09/21/2011  . Low back pain with sciatica 07/07/2010  . Uncontrolled type 2 diabetes with renal manifestation ([place]) 03/06/2008  . Hyperlipidemia 03/06/2008  . Hx of completed stroke 03/06/2008  . Diabetes ([place]) 07/26/2007  . Essential hypertension 07/26/2007  . CAROTID BRUIT 07/26/2007  . Coronary atherosclerosis 07/04/2006  . OSTEOPOROSIS 07/04/2006    Orientation RESPIRATION BLADDER Height & Weight     Self  Normal Continent Weight: 138 lb (62.6 kg) Height:  5\' 6"  (167.6 cm)  BEHAVIORAL SYMPTOMS/MOOD NEUROLOGICAL BOWEL NUTRITION STATUS      Continent Diet(heart healthy)  AMBULATORY STATUS COMMUNICATION OF NEEDS Skin   Limited Assist Verbally  Normal                       Personal Care Assistance Level of Assistance  Bathing, Feeding, Dressing Bathing Assistance: Limited assistance Feeding assistance: Independent Dressing Assistance: Limited assistance     Functional Limitations Info  Sight, Hearing, Speech Sight Info: Adequate Hearing Info: Adequate Speech Info: Adequate    SPECIAL CARE FACTORS FREQUENCY  PT (By licensed PT), OT (By licensed OT)     PT Frequency: HH OT Frequency: HH            Contractures Contractures Info: Not present    Additional Factors Info  Code Status, Allergies, Isolation Precautions Code Status Info: DNR Allergies Info: Codeine, Microzide (Hydrochlorothiazide), Ace Inhibitors     Isolation Precautions Info: COVID +     Current Medications (06/30/2019):  This is the current hospital active medication list Current Facility-Administered Medications  Medication Dose Route Frequency Provider Last Rate Last Admin  . acetaminophen (TYLENOL) tablet 650 mg  650 mg Oral Q6H PRN Karmen Bongo, MD   650 mg at 06/28/19 2130  . albuterol (VENTOLIN HFA) 108 (90 Base) MCG/ACT inhaler 2 puff  2 puff Inhalation Q2H PRN Karmen Bongo, MD      . amLODipine (NORVASC) tablet 10 mg  10 mg Oral Daily Karmen Bongo, MD   10 mg at 06/30/19 0911  . aspirin chewable tablet 81 mg  81 mg Oral Daily Thurnell Lose, MD   81 mg at 06/30/19 0912  . bisacodyl (DULCOLAX) EC tablet 5 mg  5 mg Oral Daily  PRN Karmen Bongo, MD      . chlorpheniramine-HYDROcodone (TUSSIONEX) 10-8 MG/5ML suspension 5 mL  5 mL Oral Q12H PRN Karmen Bongo, MD      . dexamethasone (DECADRON) tablet 2 mg  2 mg Oral Q24H Thurnell Lose, MD   2 mg at 06/29/19 1350  . gabapentin (NEURONTIN) capsule 300 mg  300 mg Oral BID Karmen Bongo, MD   300 mg at 06/30/19 0912  . guaiFENesin-dextromethorphan (ROBITUSSIN DM) 100-10 MG/5ML syrup 10 mL  10 mL Oral Q4H PRN Karmen Bongo, MD   10 mL at 06/28/19 2130  . haloperidol  lactate (HALDOL) injection 2 mg  2 mg Intravenous Q6H PRN Thurnell Lose, MD      . heparin injection 5,000 Units  5,000 Units Subcutaneous Camelia Phenes Karmen Bongo, MD   5,000 Units at 06/30/19 [phone]  . hydrALAZINE (APRESOLINE) tablet 50 mg  50 mg Oral Q8H Thurnell Lose, MD   50 mg at 06/30/19 0653  . insulin aspart (novoLOG) injection 0-15 Units  0-15 Units Subcutaneous TID WC Thurnell Lose, MD   2 Units at 06/29/19 [phone]  . insulin aspart (novoLOG) injection 0-5 Units  0-5 Units Subcutaneous QHS Lala Lund K, MD      . insulin glargine (LANTUS) injection 25 Units  25 Units Subcutaneous Daily Thurnell Lose, MD   25 Units at 06/30/19 0912  . isosorbide dinitrate (ISORDIL) tablet 10 mg  10 mg Oral TID Karmen Bongo, MD   10 mg at 06/30/19 0911  . latanoprost (XALATAN) 0.005 % ophthalmic solution 1 drop  1 drop Both Eyes QHS Karmen Bongo, MD   1 drop at 06/29/19 2127  . mirabegron ER (MYRBETRIQ) tablet 25 mg  25 mg Oral Daily Karmen Bongo, MD   25 mg at 06/30/19 0912  . ondansetron (ZOFRAN) injection 4 mg  4 mg Intravenous Q6H PRN Karmen Bongo, MD      . polyethylene glycol (MIRALAX / GLYCOLAX) packet 17 g  17 g Oral Daily PRN Karmen Bongo, MD      . remdesivir 100 mg in sodium chloride 0.9 % 100 mL IVPB  100 mg Intravenous Daily Karmen Bongo, MD 200 mL/hr at 06/30/19 0918 100 mg at 06/30/19 0918  . sodium phosphate (FLEET) 7-19 GM/118ML enema 1 enema  1 enema Rectal Once PRN Karmen Bongo, MD      . tamsulosin [facility]) capsule 0.4 mg  0.4 mg Oral Daily Karmen Bongo, MD   0.4 mg at 06/30/19 9450     Discharge Medications: Please see discharge summary for a list of discharge medications.  Relevant Imaging Results:  Relevant Lab Results:   Additional Information SSN;[id number]  Eileen Stanford, LCSW

## 2019-06-30 NOTE — TOC Transition Note (Signed)
Transition of Care [facility]) - CM/SW Discharge Note   Patient Details  Name: Leonard [name] MRN: [id number] Date of Birth: 08-15-1920  [facility]) CM/SW Contact:  Eileen Stanford, LCSW Phone Number: 06/30/2019, 10:06 AM   Clinical Narrative:   Clinical Social Worker facilitated patient discharge including contacting patient family and facility to confirm patient discharge plans.  Clinical information faxed to facility and family agreeable with plan.  CSW arranged ambulance transport via [facility] to [facility].  RN to call [phone] for report prior to discharge.      Final next level of care: Assisted Living Barriers to Discharge: No Barriers Identified   Patient Goals and CMS Choice Patient states their goals for this hospitalization and ongoing recovery are:: for pt to return to ALF      Discharge Placement              Patient chooses bed at: [facility]) Patient to be transferred to facility by: [place] Name of family member notified: Neoma Laming Patient and family notified of of transfer: 06/30/19  Discharge Plan and Services In-house Referral: Clinical Social Work   Post Acute Care Choice: Home Health                    HH Arranged: PT, RN, Nurse's Aide [place] Agency: [place] Date [facility] Agency Contacted: 06/30/19 Time [place]: [phone] Representative spoke with at [place]: [place] Determinants of Health (SDOH) Interventions     Readmission Risk Interventions No flowsheet data found.

## 2019-06-30 NOTE — TOC Initial Note (Addendum)
Transition of Care [facility]) - Initial/Assessment Note    Patient Details  Name: Leonard [name] MRN: [id number] Date of Birth: [birth year]  Transition of Care [facility]) CM/SW Contact:    Eileen Stanford, LCSW Phone Number: 06/30/2019, 9:47 AM  Clinical Narrative:    Pt resides at [facility]. Pt's daughter confirmed plan is for pt to return at d/c. CSW will reach out to facility. Pt's daughter aware pt d/c today. Pt's daughter will be brining pt clothes around 12:00 today. CSW will set up pickup for 12:30.    [facility] will service pt at ALF ; PT, RN, and RN Aid             Expected Discharge Plan: Assisted Living Barriers to Discharge: No Barriers Identified   Patient Goals and CMS Choice Patient states their goals for this hospitalization and ongoing recovery are:: for pt to return to ALF      Expected Discharge Plan and Services Expected Discharge Plan: Assisted Living In-house Referral: Clinical Social Work   Post Acute Care Choice: [place] arrangements for the past 2 months: [place] Expected Discharge Date: 06/30/19                         HH Arranged: PT, RN, Nurse's Aide HH Agency: [place] Date [facility] Agency Contacted: 06/30/19 Time [place]: [phone] Representative spoke with at [place]  Prior Living Arrangements/Services Living arrangements for the past 2 months: [place] Lives with:: Self Patient language and need for interpreter reviewed:: Yes Do you feel safe going back to the place where you live?: Yes      Need for Family Participation in Patient Care: Yes (Comment) Care giver support system in place?: Yes (comment)   Criminal Activity/Legal Involvement Pertinent to Current Situation/Hospitalization: No - Comment as needed  Activities of Daily Living Home Assistive Devices/Equipment: CBG Meter, Blood pressure cuff, Grab bars around toilet, Grab bars in shower, Hand-held shower  hose, Walker (specify type), Eyeglasses, Scales(front wheeled walker-[facility] has necessary equipment for their residents) ADL Screening (condition at time of admission) Patient's cognitive ability adequate to safely complete daily activities?: Yes Is the patient deaf or have difficulty hearing?: Yes(slight hoh) Does the patient have difficulty seeing, even when wearing glasses/contacts?: Yes Does the patient have difficulty concentrating, remembering, or making decisions?: No Patient able to express need for assistance with ADLs?: Yes Does the patient have difficulty dressing or bathing?: Yes Independently performs ADLs?: No Communication: Independent Dressing (OT): Needs assistance Is this a change from baseline?: Pre-admission baseline Grooming: Needs assistance Is this a change from baseline?: Pre-admission baseline Feeding: Independent Is this a change from baseline?: Pre-admission baseline Bathing: Needs assistance Is this a change from baseline?: Pre-admission baseline Toileting: Dependent Is this a change from baseline?: Change from baseline, expected to last >3days In/Out Bed: Dependent Is this a change from baseline?: Change from baseline, expected to last >3 days Walks in Home: Dependent Is this a change from baseline?: Change from baseline, expected to last >3 days Does the patient have difficulty walking or climbing stairs?: Yes(secondary to weakness) Weakness of Legs: Both Weakness of Arms/Hands: None  Permission Sought/Granted Permission sought to share information with : Family Supports    Share Information with NAME: Neoma Laming  Permission granted to share info w AGENCY: :[place] granted to share info w Relationship: daughter     Emotional Assessment Appearance:: Appears stated age Attitude/Demeanor/Rapport:  Unable to Assess Affect (typically observed): Unable to Assess Orientation: : Oriented to Self Alcohol / Substance Use: Not Applicable Psych  Involvement: No (comment)  Admission diagnosis:  Weakness [R53.1] AKI (acute kidney injury) ([place]) [N17.9] COVID-19 virus detected [U07.1] COVID-19 [U07.1] Patient Active Problem List   Diagnosis Date Noted  . COVID-19 virus detected 06/26/2019  . Thrombocytopenia ([place]) 03/12/2017  . Mass of skin of right shoulder 03/12/2017  . Glaucoma 03/11/2017  . Hiatal hernia 03/11/2017  . Chest pain 03/11/2017  . Left leg weakness 06/29/2016  . Degenerative arthritis of left knee 05/25/2016  . Left knee pain 03/24/2016  . History of prostate cancer 06/12/2014  . Chronic renal insufficiency, stage III (moderate) 06/12/2014  . Non-compliant behavior 10/18/2013  . Abnormal computed tomography of pancreas or bile duct 09/21/2011  . Low back pain with sciatica 07/07/2010  . Uncontrolled type 2 diabetes with renal manifestation ([place]) 03/06/2008    Class: Chronic  . Hyperlipidemia 03/06/2008  . Hx of completed stroke 03/06/2008  . Diabetes ([place]) 07/26/2007  . Essential hypertension 07/26/2007    Class: Chronic  . CAROTID BRUIT 07/26/2007  . Coronary atherosclerosis 07/04/2006    Class: Chronic  . OSTEOPOROSIS 07/04/2006   PCP:  Hoyt Koch, MD Pharmacy:   [id number] [place], [place] [address] Phone: [phone] Fax: [phone]     Social Determinants of Health (SDOH) Interventions    Readmission Risk Interventions No flowsheet data found.

## 2019-06-30 NOTE — Plan of Care (Signed)
   Vital Signs MEWS/VS Documentation       06/29/2019 0743 06/29/2019 1351 06/29/2019 1605 06/29/2019 2055   MEWS Score:  0  0  0  0   MEWS Score Color:  Green  Green  Green  Green   Resp:  18  --  18  18   Pulse:  (!) 58  --  (!) 56  66   BP:  (!) 161/71  122/64  128/72  (!) 134/57   Temp:  97.7 F (36.5 C)  --  98.5 F (36.9 C)  97.9 F (36.6 C)   O2 Device:  Room Air  --  Room [employer]       POC reviewed with pt     Leonard [name] 06/30/2019,1:15 AM

## 2019-07-01 LAB — CULTURE, BLOOD (ROUTINE X 2)
Culture: NO GROWTH
Culture: NO GROWTH

## 2019-07-01 LAB — GLUCOSE, CAPILLARY: Glucose-Capillary: 69 mg/dL — ABNORMAL LOW (ref 70–99)

## 2019-07-02 ENCOUNTER — Telehealth: Payer: Self-pay | Admitting: *Deleted

## 2019-07-02 NOTE — Telephone Encounter (Signed)
Pt was on TCM report admitted 06/26/19 for weakness in the setting of known COVID-19 infection.  Pt was treated with IV steroids and remdesivir, stayed symptom-free on room air, finishes his course today and then will be discharged 06/30/19 back to ALF. Per chart pt has not seen Dr. Sharlet Salina since 2018.Marland KitchenJohny [name]

## 2019-09-18 ENCOUNTER — Other Ambulatory Visit: Payer: Self-pay

## 2019-09-18 ENCOUNTER — Encounter: Payer: Self-pay | Admitting: Sports Medicine

## 2019-09-18 ENCOUNTER — Ambulatory Visit (INDEPENDENT_AMBULATORY_CARE_PROVIDER_SITE_OTHER): Payer: Medicare Other | Admitting: Sports Medicine

## 2019-09-18 VITALS — Temp 96.1°F

## 2019-09-18 DIAGNOSIS — E1140 Type 2 diabetes mellitus with diabetic neuropathy, unspecified: Secondary | ICD-10-CM | POA: Diagnosis not present

## 2019-09-18 DIAGNOSIS — M79675 Pain in left toe(s): Secondary | ICD-10-CM

## 2019-09-18 DIAGNOSIS — M79674 Pain in right toe(s): Secondary | ICD-10-CM | POA: Diagnosis not present

## 2019-09-18 DIAGNOSIS — I739 Peripheral vascular disease, unspecified: Secondary | ICD-10-CM | POA: Diagnosis not present

## 2019-09-18 DIAGNOSIS — B351 Tinea unguium: Secondary | ICD-10-CM | POA: Diagnosis not present

## 2019-09-18 NOTE — Progress Notes (Signed)
Subjective: Leonard [name] is a 86 y.o. male patient with history of diabetes who presents to office today complaining of long,mildly painful nails  while ambulating in shoes; unable to trim. Patient states that the glucose reading this morning was not recorded but they keep track of it at [facility]. Patient denies any new cramping, numbness, burning or tingling in the legs except having an episode of some numbness and burning on the left second and third toes but now this pain has gone.  Patient is assisted by daughter this visit.  Review of Systems  All other systems reviewed and are negative.    Patient Active Problem List   Diagnosis Date Noted  . COVID-19 virus detected 06/26/2019  . Thrombocytopenia ([place]) 03/12/2017  . Mass of skin of right shoulder 03/12/2017  . Glaucoma 03/11/2017  . Hiatal hernia 03/11/2017  . Chest pain 03/11/2017  . Left leg weakness 06/29/2016  . Degenerative arthritis of left knee 05/25/2016  . Left knee pain 03/24/2016  . History of prostate cancer 06/12/2014  . Chronic renal insufficiency, stage III (moderate) 06/12/2014  . Non-compliant behavior 10/18/2013  . Abnormal computed tomography of pancreas or bile duct 09/21/2011  . Low back pain with sciatica 07/07/2010  . Uncontrolled type 2 diabetes with renal manifestation ([place]) 03/06/2008    Class: Chronic  . Hyperlipidemia 03/06/2008  . Hx of completed stroke 03/06/2008  . Diabetes ([place]) 07/26/2007  . Essential hypertension 07/26/2007    Class: Chronic  . CAROTID BRUIT 07/26/2007  . Coronary atherosclerosis 07/04/2006    Class: Chronic  . OSTEOPOROSIS 07/04/2006   Current Outpatient Medications on File Prior to Visit  Medication Sig Dispense Refill  . amLODipine (NORVASC) 10 MG tablet TAKE 1 TABLET BY MOUTH EVERY DAY (Patient taking differently: Take 10 mg by mouth daily. ) 90 tablet 1  . aspirin 81 MG tablet Take 81 mg by mouth daily.     . Cholecalciferol (VITAMIN D3) 125 MCG (5000  UT) CAPS Take 5,000 Units by mouth every 14 (fourteen) days.    . diclofenac sodium (VOLTAREN) 1 % GEL Apply 2 g topically 2 (two) times daily as needed. (Patient taking differently: Apply 2 g topically 2 (two) times daily. Joints of right finger) 100 g 3  . gabapentin (NEURONTIN) 600 MG tablet Take 600 mg by mouth 3 (three) times daily.      . Insulin Glargine (BASAGLAR KWIKPEN) 100 UNIT/ML SOPN Inject 9 Units into the skin daily.    . insulin lispro (HUMALOG) 100 UNIT/ML injection Inject 0-7 Units into the skin 3 (three) times daily before meals. 0-180 0 units 181-240 3 units 241-300 4 units 301-360 5 units 361-420 6 units 421+ 7 units    . Insulin Pen Needle 32G X 4 MM MISC As directed per insulin dose and sliding scale 100 each 1  . isosorbide dinitrate (ISORDIL) 10 MG tablet Take 1 tablet (10 mg total) by mouth 3 (three) times daily. 90 tablet 0  . latanoprost (XALATAN) 0.005 % ophthalmic solution Place 1 drop into both eyes at bedtime.    Marland Kitchen losartan (COZAAR) 100 MG tablet Take 1 tablet (100 mg total) by mouth daily. Overdue for annual appt w/labs must see MD for refills 30 tablet 0  . meclizine (ANTIVERT) 12.5 MG tablet Take 12.5 mg by mouth 3 (three) times daily as needed for dizziness.    . mirabegron ER (MYRBETRIQ) 25 MG TB24 tablet Take 25 mg by mouth daily.    . Omega 3  1200 MG CAPS Take 1,200 mg by mouth 2 (two) times daily.    . Tamsulosin HCl (FLOMAX) 0.4 MG CAPS Take 0.4 mg by mouth daily.     No current facility-administered medications on file prior to visit.   Allergies  Allergen Reactions  . Codeine Nausea And Vomiting and Other (See Comments)    hallucinations  . Microzide [Hydrochlorothiazide] Other (See Comments)    Acute gout on LosartanHCT 12/2014  . Ace Inhibitors Other (See Comments)     cough      Objective: General: Patient is awake, alert, and oriented x 2 and in no acute distress.  Integument: Skin is warm, dry and supple bilateral. Nails are tender,  long, thickened and  dystrophic with subungual debris, consistent with onychomycosis, 1-5 bilateral. No signs of infection. No open lesions or preulcerative lesions present bilateral. Remaining integument unremarkable.  Vasculature:  Dorsalis Pedis pulse 1/4 bilateral. Posterior Tibial pulse 0/4 bilateral. Capillary fill time <3 sec 1-5 bilateral. Positive hair growth to the level of the digits. Temperature gradient within normal limits. No varicosities present bilateral.  Pitting edema present bilateral ankles 1+.  Neurology: The patient has severely diminished sensation with Semmes Weinstein monofilament and tuning fork bilateral.  Musculoskeletal: Asymptomatic hammertoe and pes planus deformity noted bilateral. No tenderness with calf compression bilateral.  Assessment and Plan: Problem List Items Addressed This Visit      Endocrine   Diabetes ([place])    Other Visit Diagnoses    Pain due to onychomycosis of toenails of both feet    -  Primary   PVD (peripheral vascular disease) ([place])          -Examined patient. -Discussed and educated patient on diabetic foot care, especially with  regards to the vascular, neurological and musculoskeletal systems.  -Stressed the importance of good glycemic control and the detriment of not  controlling glucose levels in relation to the foot. -Mechanically debrided all nails 1-5 bilateral using sterile nail nipper and filed with dremel without incident  -Encourage elevation of legs to assist with edema control -Answered all patient questions -Patient to return  in 3 months for at risk foot care -Patient advised to call the office if any problems or questions arise in the meantime.  Paperwork completed for [facility] facility with plan of care as above.  Landis Martins, DPM

## 2019-09-26 ENCOUNTER — Ambulatory Visit (INDEPENDENT_AMBULATORY_CARE_PROVIDER_SITE_OTHER): Payer: Medicare Other | Admitting: Ophthalmology

## 2019-09-26 ENCOUNTER — Other Ambulatory Visit: Payer: Self-pay

## 2019-09-26 ENCOUNTER — Encounter (INDEPENDENT_AMBULATORY_CARE_PROVIDER_SITE_OTHER): Payer: Self-pay | Admitting: Ophthalmology

## 2019-09-26 DIAGNOSIS — E113393 Type 2 diabetes mellitus with moderate nonproliferative diabetic retinopathy without macular edema, bilateral: Secondary | ICD-10-CM

## 2019-09-26 DIAGNOSIS — H43813 Vitreous degeneration, bilateral: Secondary | ICD-10-CM | POA: Diagnosis not present

## 2019-09-26 DIAGNOSIS — H401190 Primary open-angle glaucoma, unspecified eye, stage unspecified: Secondary | ICD-10-CM

## 2019-09-26 DIAGNOSIS — Z961 Presence of intraocular lens: Secondary | ICD-10-CM | POA: Diagnosis not present

## 2019-09-26 DIAGNOSIS — H59813 Chorioretinal scars after surgery for detachment, bilateral: Secondary | ICD-10-CM | POA: Diagnosis not present

## 2019-09-26 NOTE — Assessment & Plan Note (Signed)
The nature of moderate nonproliferative diabetic retinopathy was discussed with the patient as well as the need for more frequent follow up to judge for progression. Good blood glucose, blood pressure, and serum lipid control was recommended as well as avoidance of smoking and maintenance of normal weight.  Close follow up with PCP encouraged. °

## 2019-09-26 NOTE — Progress Notes (Signed)
09/26/2019     CHIEF COMPLAINT Patient presents for Diabetic Eye Exam   HISTORY OF PRESENT ILLNESS: Leonard [name] is a 86 y.o. male who presents to the clinic today for:   HPI    Diabetic Eye Exam    Vision is stable.  Associated Symptoms Negative for Flashes and Floaters.  Diabetes characteristics include Type 2.  Blood sugar level is controlled.  Last Blood Glucose 220.  Last A1C 8.7.  I, the attending physician,  performed the HPI with the patient and updated documentation appropriately.          Comments    2 Year Diabetic Exam OU. OCT  Pt states vision is stable. Denies floaters and FOL.  BGL: 220 before lunch A1C: 8.7 in Jan       Last edited by Tilda Franco on 09/26/2019  1:34 PM. (History)      Referring physician: Hoyt Koch, MD [place],  [place] [address]  HISTORICAL INFORMATION:   Selected notes from the MEDICAL RECORD NUMBER    Lab Results  Component Value Date   HGBA1C 8.7 (H) 06/27/2019     CURRENT MEDICATIONS: Current Outpatient Medications (Ophthalmic Drugs)  Medication Sig  . latanoprost (XALATAN) 0.005 % ophthalmic solution Place 1 drop into both eyes at bedtime.   No current facility-administered medications for this visit. (Ophthalmic Drugs)   Current Outpatient Medications (Other)  Medication Sig  . amLODipine (NORVASC) 10 MG tablet TAKE 1 TABLET BY MOUTH EVERY DAY (Patient taking differently: Take 10 mg by mouth daily. )  . aspirin 81 MG tablet Take 81 mg by mouth daily.   . Cholecalciferol (VITAMIN D3) 125 MCG (5000 UT) CAPS Take 5,000 Units by mouth every 14 (fourteen) days.  . diclofenac sodium (VOLTAREN) 1 % GEL Apply 2 g topically 2 (two) times daily as needed. (Patient taking differently: Apply 2 g topically 2 (two) times daily. Joints of right finger)  . gabapentin (NEURONTIN) 600 MG tablet Take 600 mg by mouth 3 (three) times daily.    . Insulin Glargine (BASAGLAR KWIKPEN) 100 UNIT/ML SOPN Inject  9 Units into the skin daily.  . insulin lispro (HUMALOG) 100 UNIT/ML injection Inject 0-7 Units into the skin 3 (three) times daily before meals. 0-180 0 units 181-240 3 units 241-300 4 units 301-360 5 units 361-420 6 units 421+ 7 units  . Insulin Pen Needle 32G X 4 MM MISC As directed per insulin dose and sliding scale  . isosorbide dinitrate (ISORDIL) 10 MG tablet Take 1 tablet (10 mg total) by mouth 3 (three) times daily.  Marland Kitchen losartan (COZAAR) 100 MG tablet Take 1 tablet (100 mg total) by mouth daily. Overdue for annual appt w/labs must see MD for refills  . meclizine (ANTIVERT) 12.5 MG tablet Take 12.5 mg by mouth 3 (three) times daily as needed for dizziness.  . mirabegron ER (MYRBETRIQ) 25 MG TB24 tablet Take 25 mg by mouth daily.  . Omega 3 1200 MG CAPS Take 1,200 mg by mouth 2 (two) times daily.  . Tamsulosin HCl (FLOMAX) 0.4 MG CAPS Take 0.4 mg by mouth daily.   No current facility-administered medications for this visit. (Other)      REVIEW OF SYSTEMS:    ALLERGIES Allergies  Allergen Reactions  . Codeine Nausea And Vomiting and Other (See Comments)    hallucinations  . Microzide [Hydrochlorothiazide] Other (See Comments)    Acute gout on LosartanHCT 12/2014  . Ace Inhibitors Other (See  Comments)     cough    PAST MEDICAL HISTORY Past Medical History:  Diagnosis Date  . Bladder tumor    hematuria  . Carotid stenosis, bilateral per doppler 08-20-09   mild  . Coronary artery disease    A. S/P CABG w/ Redo CABG in 1997;  b. 2005 Cath: LIMA->LAD ok, VG->OM ok, VG->RPDA->RPLV ok. Severe native 3VD.  Marland Kitchen DDD (degenerative disc disease), lumbar    s/p fusion 4-5  . Diabetes mellitus    oral med and insulin  . Diabetic peripheral neuropathy (HCC)    feet  . Diastolic dysfunction    a. 05/2011 Echo: EF 55%, Gr1 DD, mild to mod MR, mildly to mod dil LA, mild TR.  . Glaucoma bilateral    eye drop rx  . History of hiatal hernia    denies reflux  . HOH (hard of  hearing) no hearing aids  . Hypertension   . Mass of skin of right shoulder 03/12/2017  . Nocturia   . Prostate cancer [facility]) 1996   s/p external radiation  . Renal insufficiency hx  . Syncope 06/06/2011   In context of dehydration and bradycardia.  . Thrombocytopenia ([place]) 03/12/2017   Past Surgical History:  Procedure Laterality Date  . CARDIAC CATHETERIZATION  last cath 2005   per dr Olevia Perches- w/ chart (grafts patent)-  results w/ chart  . CATARACT EXTRACTION W/ INTRAOCULAR LENS  IMPLANT, BILATERAL    . CATARACT EXTRACTION W/PHACO Bilateral    Dr. Gershon Crane  . CORONARY ARTERY BYPASS GRAFT  1984   4 vessels  . CORONARY ARTERY BYPASS GRAFT  1997- redo   3 vessels  . LIPOMA EXCISION  1970's   from back  . LUMBAR FUSION  01/2004   L 4-5  . TRANSURETHRAL RESECTION OF BLADDER TUMOR  04/16/2011   Procedure: TRANSURETHRAL RESECTION OF BLADDER TUMOR (TURBT);  Surgeon: Claybon Jabs;  Location: [facility];  Service: Urology;  Laterality: N/A;  . UPPER GASTROINTESTINAL ENDOSCOPY     dx hiatal hernia    FAMILY HISTORY Family History  Problem Relation Age of Onset  . Diabetes Sister   . Cancer Neg Hx   . Heart disease Neg Hx   . Stroke Neg Hx     SOCIAL HISTORY Social History   Tobacco Use  . Smoking status: Never Smoker  . Smokeless tobacco: Never Used  Substance Use Topics  . Alcohol use: No  . Drug use: No         OPHTHALMIC EXAM: Base Eye Exam    Visual Acuity (Snellen - Linear)      Right Left   Dist [place] 20/25 -1 20/25 -2       Tonometry (Tonopen, 1:40 PM)      Right Left   Pressure 10 11       Pupils      Pupils Dark Light Shape React APD   Right PERRL 2 1.5 Round Slow None   Left PERRL 2 2 Round Minimal None       Visual Fields (Counting fingers)      Left Right   Restrictions Total inferior temporal deficiency Total superior temporal deficiency       Neuro/Psych    Oriented x3: Yes   Mood/Affect: Normal       Dilation    Both  eyes: 1.0% Mydriacyl, 2.5% Phenylephrine @ 1:40 PM        Slit Lamp and Fundus Exam    External  Exam      Right Left   External Normal Normal       Slit Lamp Exam      Right Left   Lids/Lashes Normal Normal   Conjunctiva/Sclera White and quiet White and quiet   Cornea Clear Clear   Anterior Chamber Deep and quiet Deep and quiet   Iris Round and reactive Round and reactive   Lens Posterior chamber intraocular lens Posterior chamber intraocular lens   Anterior Vitreous Normal Normal       Fundus Exam      Right Left   C/D Ratio 0.5 0.7-0.8          IMAGING AND PROCEDURES  Imaging and Procedures for 09/26/19           ASSESSMENT/PLAN:  No problem-specific Assessment & Plan notes found for this encounter.      ICD-10-CM   1. Moderate nonproliferative diabetic retinopathy of both eyes without macular edema associated with type 2 diabetes mellitus (HCC)  W40.9735 OCT, Retina - OU - Both Eyes  2. Pseudophakia  Z96.1   3. Posterior vitreous detachment of both eyes  H43.813 OCT, Retina - OU - Both Eyes    1.  2.  3.  Ophthalmic Meds Ordered this visit:  No orders of the defined types were placed in this encounter.      No follow-ups on file.  There are no Patient Instructions on file for this visit.   Explained the diagnoses, plan, and follow up with the patient and they expressed understanding.  Patient expressed understanding of the importance of proper follow up care.   Clent Demark Janay Canan M.D. Diseases & Surgery of the Retina and Vitreous [facility] 09/26/19     Abbreviations: M myopia (nearsighted); A astigmatism; H hyperopia (farsighted); P presbyopia; Mrx spectacle prescription;  CTL contact lenses; OD right eye; OS left eye; OU both eyes  XT exotropia; ET esotropia; PEK punctate epithelial keratitis; PEE punctate epithelial erosions; DES dry eye syndrome; MGD meibomian gland dysfunction; ATs artificial tears; PFAT's preservative  free artificial tears; [place] nuclear sclerotic cataract; PSC posterior subcapsular cataract; ERM epi-retinal membrane; PVD posterior vitreous detachment; RD retinal detachment; DM diabetes mellitus; DR diabetic retinopathy; NPDR non-proliferative diabetic retinopathy; PDR proliferative diabetic retinopathy; CSME clinically significant macular edema; DME diabetic macular edema; dbh dot blot hemorrhages; CWS cotton wool spot; POAG primary open angle glaucoma; C/D cup-to-disc ratio; HVF humphrey visual field; GVF goldmann visual field; OCT optical coherence tomography; IOP intraocular pressure; BRVO Branch retinal vein occlusion; CRVO central retinal vein occlusion; CRAO central retinal artery occlusion; BRAO branch retinal artery occlusion; RT retinal tear; SB scleral buckle; PPV pars plana vitrectomy; VH Vitreous hemorrhage; PRP panretinal laser photocoagulation; IVK intravitreal kenalog; VMT vitreomacular traction; MH Macular hole;  NVD neovascularization of the disc; NVE neovascularization elsewhere; AREDS age related eye disease study; ARMD age related macular degeneration; POAG primary open angle glaucoma; EBMD epithelial/anterior basement membrane dystrophy; ACIOL anterior chamber intraocular lens; IOL intraocular lens; PCIOL posterior chamber intraocular lens; Phaco/IOL phacoemulsification with intraocular lens placement; [place] photorefractive keratectomy; LASIK laser assisted in situ keratomileusis; HTN hypertension; DM diabetes mellitus; COPD chronic obstructive pulmonary disease

## 2019-09-26 NOTE — Assessment & Plan Note (Signed)

## 2019-11-12 ENCOUNTER — Inpatient Hospital Stay (HOSPITAL_COMMUNITY): Payer: Medicare Other

## 2019-11-12 ENCOUNTER — Emergency Department (HOSPITAL_COMMUNITY): Payer: Medicare Other

## 2019-11-12 ENCOUNTER — Inpatient Hospital Stay (HOSPITAL_COMMUNITY)
Admission: EM | Admit: 2019-11-12 | Discharge: 2019-11-20 | DRG: 291 | Disposition: A | Discharge status: Skilled Nursing Facility | Payer: Medicare Other | Source: Skilled Nursing Facility | Attending: Internal Medicine | Admitting: Internal Medicine

## 2019-11-12 DIAGNOSIS — I2510 Atherosclerotic heart disease of native coronary artery without angina pectoris: Secondary | ICD-10-CM | POA: Diagnosis present

## 2019-11-12 DIAGNOSIS — H9190 Unspecified hearing loss, unspecified ear: Secondary | ICD-10-CM | POA: Diagnosis present

## 2019-11-12 DIAGNOSIS — Z8673 Personal history of transient ischemic attack (TIA), and cerebral infarction without residual deficits: Secondary | ICD-10-CM

## 2019-11-12 DIAGNOSIS — Z981 Arthrodesis status: Secondary | ICD-10-CM

## 2019-11-12 DIAGNOSIS — E538 Deficiency of other specified B group vitamins: Secondary | ICD-10-CM | POA: Diagnosis present

## 2019-11-12 DIAGNOSIS — IMO0002 Reserved for concepts with insufficient information to code with codable children: Secondary | ICD-10-CM | POA: Diagnosis present

## 2019-11-12 DIAGNOSIS — N1831 Chronic kidney disease, stage 3a: Secondary | ICD-10-CM | POA: Diagnosis present

## 2019-11-12 DIAGNOSIS — M810 Age-related osteoporosis without current pathological fracture: Secondary | ICD-10-CM | POA: Diagnosis present

## 2019-11-12 DIAGNOSIS — H409 Unspecified glaucoma: Secondary | ICD-10-CM | POA: Diagnosis present

## 2019-11-12 DIAGNOSIS — Z8546 Personal history of malignant neoplasm of prostate: Secondary | ICD-10-CM | POA: Diagnosis not present

## 2019-11-12 DIAGNOSIS — Z20822 Contact with and (suspected) exposure to covid-19: Secondary | ICD-10-CM | POA: Diagnosis present

## 2019-11-12 DIAGNOSIS — Z794 Long term (current) use of insulin: Secondary | ICD-10-CM

## 2019-11-12 DIAGNOSIS — F05 Delirium due to known physiological condition: Secondary | ICD-10-CM | POA: Diagnosis not present

## 2019-11-12 DIAGNOSIS — I5043 Acute on chronic combined systolic (congestive) and diastolic (congestive) heart failure: Secondary | ICD-10-CM | POA: Diagnosis present

## 2019-11-12 DIAGNOSIS — E1142 Type 2 diabetes mellitus with diabetic polyneuropathy: Secondary | ICD-10-CM | POA: Diagnosis present

## 2019-11-12 DIAGNOSIS — J9811 Atelectasis: Secondary | ICD-10-CM | POA: Diagnosis present

## 2019-11-12 DIAGNOSIS — I442 Atrioventricular block, complete: Secondary | ICD-10-CM | POA: Diagnosis not present

## 2019-11-12 DIAGNOSIS — I509 Heart failure, unspecified: Secondary | ICD-10-CM

## 2019-11-12 DIAGNOSIS — N183 Chronic kidney disease, stage 3 unspecified: Secondary | ICD-10-CM | POA: Diagnosis not present

## 2019-11-12 DIAGNOSIS — E1122 Type 2 diabetes mellitus with diabetic chronic kidney disease: Secondary | ICD-10-CM | POA: Diagnosis present

## 2019-11-12 DIAGNOSIS — Z8616 Personal history of COVID-19: Secondary | ICD-10-CM

## 2019-11-12 DIAGNOSIS — D61818 Other pancytopenia: Secondary | ICD-10-CM | POA: Diagnosis present

## 2019-11-12 DIAGNOSIS — I130 Hypertensive heart and chronic kidney disease with heart failure and stage 1 through stage 4 chronic kidney disease, or unspecified chronic kidney disease: Secondary | ICD-10-CM | POA: Diagnosis present

## 2019-11-12 DIAGNOSIS — Z66 Do not resuscitate: Secondary | ICD-10-CM | POA: Diagnosis present

## 2019-11-12 DIAGNOSIS — N2889 Other specified disorders of kidney and ureter: Secondary | ICD-10-CM | POA: Diagnosis present

## 2019-11-12 DIAGNOSIS — Z6820 Body mass index (BMI) 20.0-20.9, adult: Secondary | ICD-10-CM

## 2019-11-12 DIAGNOSIS — E1129 Type 2 diabetes mellitus with other diabetic kidney complication: Secondary | ICD-10-CM | POA: Diagnosis not present

## 2019-11-12 DIAGNOSIS — E440 Moderate protein-calorie malnutrition: Secondary | ICD-10-CM | POA: Diagnosis present

## 2019-11-12 DIAGNOSIS — I5021 Acute systolic (congestive) heart failure: Secondary | ICD-10-CM | POA: Diagnosis not present

## 2019-11-12 DIAGNOSIS — I255 Ischemic cardiomyopathy: Secondary | ICD-10-CM | POA: Diagnosis present

## 2019-11-12 DIAGNOSIS — K449 Diaphragmatic hernia without obstruction or gangrene: Secondary | ICD-10-CM | POA: Diagnosis present

## 2019-11-12 DIAGNOSIS — G9340 Encephalopathy, unspecified: Secondary | ICD-10-CM | POA: Diagnosis present

## 2019-11-12 DIAGNOSIS — E1165 Type 2 diabetes mellitus with hyperglycemia: Secondary | ICD-10-CM | POA: Diagnosis not present

## 2019-11-12 DIAGNOSIS — E785 Hyperlipidemia, unspecified: Secondary | ICD-10-CM | POA: Diagnosis present

## 2019-11-12 DIAGNOSIS — I10 Essential (primary) hypertension: Secondary | ICD-10-CM | POA: Diagnosis present

## 2019-11-12 DIAGNOSIS — Z833 Family history of diabetes mellitus: Secondary | ICD-10-CM | POA: Diagnosis not present

## 2019-11-12 DIAGNOSIS — G9341 Metabolic encephalopathy: Secondary | ICD-10-CM | POA: Diagnosis present

## 2019-11-12 DIAGNOSIS — Z951 Presence of aortocoronary bypass graft: Secondary | ICD-10-CM

## 2019-11-12 DIAGNOSIS — Z923 Personal history of irradiation: Secondary | ICD-10-CM

## 2019-11-12 DIAGNOSIS — I452 Bifascicular block: Secondary | ICD-10-CM | POA: Diagnosis present

## 2019-11-12 DIAGNOSIS — I5033 Acute on chronic diastolic (congestive) heart failure: Secondary | ICD-10-CM

## 2019-11-12 DIAGNOSIS — Z79899 Other long term (current) drug therapy: Secondary | ICD-10-CM

## 2019-11-12 DIAGNOSIS — I5020 Unspecified systolic (congestive) heart failure: Secondary | ICD-10-CM | POA: Diagnosis not present

## 2019-11-12 DIAGNOSIS — Z888 Allergy status to other drugs, medicaments and biological substances status: Secondary | ICD-10-CM

## 2019-11-12 DIAGNOSIS — Z885 Allergy status to narcotic agent status: Secondary | ICD-10-CM

## 2019-11-12 DIAGNOSIS — Z7982 Long term (current) use of aspirin: Secondary | ICD-10-CM

## 2019-11-12 DIAGNOSIS — Z781 Physical restraint status: Secondary | ICD-10-CM

## 2019-11-12 DIAGNOSIS — I5023 Acute on chronic systolic (congestive) heart failure: Secondary | ICD-10-CM | POA: Diagnosis not present

## 2019-11-12 LAB — COMPREHENSIVE METABOLIC PANEL
ALT: 15 U/L (ref 0–44)
AST: 25 U/L (ref 15–41)
Albumin: 3.3 g/dL — ABNORMAL LOW (ref 3.5–5.0)
Alkaline Phosphatase: 102 U/L (ref 38–126)
Anion gap: 10 (ref 5–15)
BUN: 18 mg/dL (ref 8–23)
CO2: 19 mmol/L — ABNORMAL LOW (ref 22–32)
Calcium: 8.2 mg/dL — ABNORMAL LOW (ref 8.9–10.3)
Chloride: 106 mmol/L (ref 98–111)
Creatinine, Ser: 1.33 mg/dL — ABNORMAL HIGH (ref 0.61–1.24)
GFR calc Af Amer: 52 mL/min — ABNORMAL LOW (ref >60)
GFR calc non Af Amer: 44 mL/min — ABNORMAL LOW (ref >60)
Glucose, Bld: 155 mg/dL — ABNORMAL HIGH (ref 70–99)
Potassium: 4.7 mmol/L (ref 3.5–5.1)
Sodium: 135 mmol/L (ref 135–145)
Total Bilirubin: 1.2 mg/dL (ref 0.3–1.2)
Total Protein: 5.9 g/dL — ABNORMAL LOW (ref 6.5–8.1)

## 2019-11-12 LAB — CBC WITH DIFFERENTIAL/PLATELET
Abs Immature Granulocytes: 0 10*3/uL (ref 0.00–0.07)
Basophils Absolute: 0 10*3/uL (ref 0.0–0.1)
Basophils Relative: 1 %
Eosinophils Absolute: 0.2 10*3/uL (ref 0.0–0.5)
Eosinophils Relative: 6 %
HCT: 39.1 % (ref 39.0–52.0)
Hemoglobin: 12.8 g/dL — ABNORMAL LOW (ref 13.0–17.0)
Immature Granulocytes: 0 %
Lymphocytes Relative: 49 %
Lymphs Abs: 1.8 10*3/uL (ref 0.7–4.0)
MCH: 28.7 pg (ref 26.0–34.0)
MCHC: 32.7 g/dL (ref 30.0–36.0)
MCV: 87.7 fL (ref 80.0–100.0)
Monocytes Absolute: 0.3 10*3/uL (ref 0.1–1.0)
Monocytes Relative: 9 %
Neutro Abs: 1.3 10*3/uL — ABNORMAL LOW (ref 1.7–7.7)
Neutrophils Relative %: 35 %
Platelets: 101 10*3/uL — ABNORMAL LOW (ref 150–400)
RBC: 4.46 MIL/uL (ref 4.22–5.81)
RDW: 14.9 % (ref 11.5–15.5)
WBC: 3.7 10*3/uL — ABNORMAL LOW (ref 4.0–10.5)
nRBC: 0 % (ref 0.0–0.2)

## 2019-11-12 LAB — CBG MONITORING, ED: Glucose-Capillary: 111 mg/dL — ABNORMAL HIGH (ref 70–99)

## 2019-11-12 LAB — TROPONIN I (HIGH SENSITIVITY)
Troponin I (High Sensitivity): 38 ng/L — ABNORMAL HIGH (ref <18)
Troponin I (High Sensitivity): 49 ng/L — ABNORMAL HIGH (ref <18)

## 2019-11-12 LAB — SARS CORONAVIRUS 2 BY RT PCR (HOSPITAL ORDER, PERFORMED IN ~~LOC~~ HOSPITAL LAB): SARS Coronavirus 2: NEGATIVE

## 2019-11-12 LAB — D-DIMER, QUANTITATIVE: D-Dimer, Quant: 1.67 ug/mL-FEU — ABNORMAL HIGH (ref 0.00–0.50)

## 2019-11-12 LAB — BRAIN NATRIURETIC PEPTIDE: B Natriuretic Peptide: 447.9 pg/mL — ABNORMAL HIGH (ref 0.0–100.0)

## 2019-11-12 MED ORDER — IOHEXOL 350 MG/ML SOLN
60.0000 mL | Freq: Once | INTRAVENOUS | Status: AC | PRN
  Administered 2019-11-12: 60 mL via INTRAVENOUS

## 2019-11-12 MED ORDER — FUROSEMIDE 10 MG/ML IJ SOLN
40.0000 mg | Freq: Once | INTRAMUSCULAR | Status: AC
  Administered 2019-11-12: 40 mg via INTRAVENOUS
  Filled 2019-11-12: qty 4

## 2019-11-12 NOTE — ED Provider Notes (Addendum)
[facility] EMERGENCY DEPARTMENT Provider Note   CSN: [id number] Arrival date & time: 11/12/19  1546     History Chief Complaint  Patient presents with  . Shortness of Breath    Leonard [name] is a 86 y.o. male.  HPI     86 year old male with history of diabetes, coronary artery disease, diastolic dysfunction, hypertension, prostate cancer, who presents with shortness of breath beginning on Thursday.  Reports dyspnea beginning on Thursday with nonproductive cough.  Feels dyspnea is worse with exertion.  Denies any significant orthopnea.  Reports mild leg swelling.  Denies fevers, abd pain, n/v. Family reports he has been more confused, talking about going to [facility] today. Resides in assisted living.   Past Medical History:  Diagnosis Date  . Bladder tumor    hematuria  . Carotid stenosis, bilateral per doppler 08-20-09   mild  . Coronary artery disease    A. S/P CABG w/ Redo CABG in 1997;  b. 2005 Cath: LIMA->LAD ok, VG->OM ok, VG->RPDA->RPLV ok. Severe native 3VD.  Marland Kitchen DDD (degenerative disc disease), lumbar    s/p fusion 4-5  . Diabetes mellitus    oral med and insulin  . Diabetic peripheral neuropathy (HCC)    feet  . Diastolic dysfunction    a. 05/2011 Echo: EF 55%, Gr1 DD, mild to mod MR, mildly to mod dil LA, mild TR.  . Glaucoma bilateral    eye drop rx  . History of hiatal hernia    denies reflux  . HOH (hard of hearing) no hearing aids  . Hypertension   . Mass of skin of right shoulder 03/12/2017  . Nocturia   . Prostate cancer [facility]) 1996   s/p external radiation  . Renal insufficiency hx  . Syncope 06/06/2011   In context of dehydration and bradycardia.  . Thrombocytopenia ([place]) 03/12/2017    Patient Active Problem List   Diagnosis Date Noted  . Acute CHF (congestive heart failure) ([place]) 11/12/2019  . CHF (congestive heart failure) ([place]) 11/12/2019  . Moderate nonproliferative diabetic retinopathy of both eyes ([place]) 09/26/2019  .  Pseudophakia 09/26/2019  . Posterior vitreous detachment of both eyes 09/26/2019  . 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 COVID-19 virus detected 06/26/2019  . Thrombocytopenia ([place]) 03/12/2017  . Mass of skin of right shoulder 03/12/2017  . Normal pressure primary open angle glaucoma (POAG) 03/11/2017  . Hiatal hernia 03/11/2017  . Chest pain 03/11/2017  . Left leg weakness 06/29/2016  . Degenerative arthritis of left knee 05/25/2016  . Left knee pain 03/24/2016  . History of prostate cancer 06/12/2014  . Chronic renal insufficiency, stage III (moderate) 06/12/2014  . Non-compliant behavior 10/18/2013  . Abnormal computed tomography of pancreas or bile duct 09/21/2011  . Low back pain with sciatica 07/07/2010  . Uncontrolled type 2 diabetes with renal manifestation ([place]) 03/06/2008    Class: Chronic  . Hyperlipidemia 03/06/2008  . Hx of completed stroke 03/06/2008  . Diabetes ([place]) 07/26/2007  . Essential hypertension 07/26/2007    Class: Chronic  . CAROTID BRUIT 07/26/2007  . Coronary atherosclerosis 07/04/2006    Class: Chronic  . OSTEOPOROSIS 07/04/2006    Past Surgical History:  Procedure Laterality Date  . CARDIAC CATHETERIZATION  last cath 2005   per dr Olevia Perches- w/ chart (grafts patent)-  results w/ chart  . CATARACT EXTRACTION W/ INTRAOCULAR LENS  IMPLANT, BILATERAL    . CATARACT EXTRACTION W/PHACO Bilateral    Dr. Gershon Crane  .  CORONARY ARTERY BYPASS GRAFT  1984   4 vessels  . CORONARY ARTERY BYPASS GRAFT  1997- redo   3 vessels  . LIPOMA EXCISION  1970's   from back  . LUMBAR FUSION  01/2004   L 4-5  . TRANSURETHRAL RESECTION OF BLADDER TUMOR  04/16/2011   Procedure: TRANSURETHRAL RESECTION OF BLADDER TUMOR (TURBT);  Surgeon: Claybon Jabs;  Location: [facility];  Service: Urology;  Laterality: N/A;  . UPPER GASTROINTESTINAL ENDOSCOPY     dx hiatal hernia       Family History  Problem Relation Age of Onset    . Diabetes Sister   . Cancer Neg Hx   . Heart disease Neg Hx   . Stroke Neg Hx     Social History   Tobacco Use  . Smoking status: Never Smoker  . Smokeless tobacco: Never Used  Substance Use Topics  . Alcohol use: No  . Drug use: No    Home Medications Prior to Admission medications   Medication Sig Start Date End Date Taking? Authorizing Provider  amLODipine (NORVASC) 10 MG tablet TAKE 1 TABLET BY MOUTH EVERY DAY Patient taking differently: Take 10 mg by mouth daily.  04/19/17  Yes Hoyt Koch, MD  aspirin 81 MG tablet Take 81 mg by mouth daily.    Yes [provider]  diclofenac sodium (VOLTAREN) 1 % GEL Apply 2 g topically 2 (two) times daily as needed. Patient taking differently: Apply 2 g topically 2 (two) times daily. Joints of right finger 02/04/17  Yes Rosemarie Ax, MD  gabapentin (NEURONTIN) 600 MG tablet Take 600 mg by mouth 3 (three) times daily.     Yes [provider]  Insulin Glargine (BASAGLAR KWIKPEN) 100 UNIT/ML SOPN Inject 9 Units into the skin daily.   Yes [provider]  insulin lispro (HUMALOG) 100 UNIT/ML injection Inject 0-7 Units into the skin 3 (three) times daily before meals. 0-180 0 units 181-240 3 units 241-300 4 units 301-360 5 units 361-420 6 units 421+ 7 units   Yes [provider]  Insulin Pen Needle 32G X 4 MM MISC As directed per insulin dose and sliding scale Patient taking differently: 1 each by Other route See admin instructions. As directed per insulin dose and sliding scale 02/04/17  Yes Rosemarie Ax, MD  isosorbide dinitrate (ISORDIL) 10 MG tablet Take 1 tablet (10 mg total) by mouth 3 (three) times daily. 03/13/17  Yes Samuella Cota, MD  latanoprost (XALATAN) 0.005 % ophthalmic solution Place 1 drop into both eyes at bedtime.   Yes [provider]  losartan (COZAAR) 100 MG tablet Take 1 tablet (100 mg total) by mouth daily. Overdue for annual appt w/labs must see MD for  refills Patient taking differently: Take 100 mg by mouth daily.  11/17/16  Yes Hoyt Koch, MD  meclizine (ANTIVERT) 12.5 MG tablet Take 12.5 mg by mouth 3 (three) times daily as needed for dizziness.   Yes [provider]  mirabegron ER (MYRBETRIQ) 25 MG TB24 tablet Take 25 mg by mouth daily.   Yes [provider]  Omega 3 1200 MG CAPS Take 1,200 mg by mouth 2 (two) times daily.   Yes [provider]  Tamsulosin HCl (FLOMAX) 0.4 MG CAPS Take 0.4 mg by mouth daily.   Yes [provider]    Allergies    Codeine, Microzide [hydrochlorothiazide], and Ace inhibitors  Review of Systems   Review of Systems  Constitutional: Positive for fatigue. Negative for fever.  HENT: Negative for sore throat.   Eyes: Negative for visual disturbance.  Respiratory: Positive for cough and shortness of breath.   Cardiovascular: Positive for leg swelling. Negative for chest pain.  Gastrointestinal: Negative for abdominal pain, nausea and vomiting.  Genitourinary: Negative for difficulty urinating.  Musculoskeletal: Negative for back pain and neck stiffness.  Skin: Negative for rash.  Neurological: Negative for syncope and headaches.    Physical Exam Updated Vital Signs BP (!) 173/96   Pulse 73   Temp 97.8 F (36.6 C) (Oral)   Resp (!) 21   Ht 5\' 10"  (1.778 m)   Wt 66.2 kg   SpO2 95%   BMI 20.95 kg/m   Physical Exam Vitals and nursing note reviewed.  Constitutional:      General: He is not in acute distress.    Appearance: He is well-developed. He is not diaphoretic.  HENT:     Head: Normocephalic and atraumatic.  Eyes:     Conjunctiva/sclera: Conjunctivae normal.  Neck:     Vascular: JVD present.  Cardiovascular:     Rate and Rhythm: Normal rate and regular rhythm.     Heart sounds: Normal heart sounds. No murmur. No friction rub. No gallop.   Pulmonary:     Effort: Pulmonary effort is normal. No respiratory distress.     Breath sounds:  Wheezing (occasional) present. No rales.  Abdominal:     General: There is no distension.     Palpations: Abdomen is soft.     Tenderness: There is no abdominal tenderness. There is no guarding.  Musculoskeletal:     Cervical back: Normal range of motion.     Right lower leg: Edema present.     Left lower leg: Edema present.  Skin:    General: Skin is warm and dry.  Neurological:     Mental Status: He is alert and oriented to person, place, and time.     ED Results / Procedures / Treatments   Labs (all labs ordered are listed, but only abnormal results are displayed) Labs Reviewed  CBC WITH DIFFERENTIAL/PLATELET - Abnormal; Notable for the following components:      Result Value   WBC 3.7 (*)    Hemoglobin 12.8 (*)    Platelets 101 (*)    Neutro Abs 1.3 (*)    All other components within normal limits  COMPREHENSIVE METABOLIC PANEL - Abnormal; Notable for the following components:   CO2 19 (*)    Glucose, Bld 155 (*)    Creatinine, Ser 1.33 (*)    Calcium 8.2 (*)    Total Protein 5.9 (*)    Albumin 3.3 (*)    GFR calc non Af Amer 44 (*)    GFR calc Af Amer 52 (*)    All other components within normal limits  BRAIN NATRIURETIC PEPTIDE - Abnormal; Notable for the following components:   B Natriuretic Peptide 447.9 (*)    All other components within normal limits  D-DIMER, QUANTITATIVE (NOT AT [facility]) - Abnormal; Notable for the following components:   D-Dimer, Quant 1.67 (*)    All other components within normal limits  CBG MONITORING, ED - Abnormal; Notable for the following components:   Glucose-Capillary 111 (*)    All other components within normal limits  TROPONIN I (HIGH SENSITIVITY) - Abnormal; Notable for the following components:   Troponin I (High Sensitivity) 38 (*)    All other components within normal limits  TROPONIN I (HIGH SENSITIVITY) - Abnormal; Notable for the following components:   Troponin I (High Sensitivity) 49 (*)    All other components within  normal limits  SARS CORONAVIRUS 2 BY RT PCR [facility] ORDER, [place] [facility])    EKG EKG Interpretation  Date/Time:  Monday November 12 2019 15:58:23 EDT Ventricular Rate:  61 PR Interval:    QRS Duration: 136 QT Interval:  577 QTC Calculation: 582 R Axis:   -9 Text Interpretation: Atrial fibrillation Nonspecific intraventricular conduction delay Probable anteroseptal infarct, old Abnormal T, consider ischemia, lateral leads No significant change since 06/26/2019 Confirmed by Veryl Speak [id number]) on 11/12/2019 4:07:47 PM   Radiology CT Angio Chest PE W and/or Wo Contrast  Result Date: 11/12/2019 CLINICAL DATA:  Progressively worsening shortness of breath over the past 3 days. EXAM: CT ANGIOGRAPHY CHEST WITH CONTRAST TECHNIQUE: Multidetector CT imaging of the chest was performed using the standard protocol during bolus administration of intravenous contrast. Multiplanar CT image reconstructions and MIPs were obtained to evaluate the vascular anatomy. CONTRAST:  33mL OMNIPAQUE IOHEXOL 350 MG/ML SOLN COMPARISON:  Chest x-ray from same day. CT chest dated April 28, 2011. FINDINGS: Cardiovascular: Satisfactory opacification of the pulmonary arteries to the segmental level. No evidence of pulmonary embolism. Unchanged cardiomegaly. No pericardial effusion. Prior CABG. New mild aneurysmal dilatation of the ascending thoracic aorta, measuring up to 4.0 cm. Coronary, aortic arch, and branch vessel atherosclerotic vascular disease. Mediastinum/Nodes: Mildly enlarged mediastinal and bilateral hilar lymph nodes measuring up to 1.3 cm in short axis, likely reactive. Unchanged calcified subcarinal lymph node. No enlarged axillary lymph nodes. The thyroid gland, trachea, and esophagus demonstrate no significant findings. Lungs/Pleura: Small right greater than left pleural effusions. Mild interlobular septal thickening at the lung bases. Severe central peribronchial thickening. Linear  scarring/atelectasis in the posterior right upper lobe. Dependent subsegmental atelectasis in the posterior right middle lobe and both lower lobes. Calcified granuloma in the lingula. No pneumothorax. Upper Abdomen: No acute abnormality. Reflux of contrast into the IVC and hepatic veins. Musculoskeletal: No chest wall abnormality. No acute or significant osseous findings. Review of the MIP images confirms the above findings. IMPRESSION: 1. No evidence of pulmonary embolism. 2. Findings consistent with congestive heart failure with small right greater than left pleural effusions and mild interstitial pulmonary edema. Evidence of right heart dysfunction. 3. Severe central peribronchial thickening, consistent with infectious or inflammatory bronchitis. 4. New ascending thoracic aortic aneurysm measuring up to 4.0 cm. Consider annual imaging followup by CTA or MRA as clinically indicated given the patient's age. This recommendation follows 2010 ACCF/AHA/AATS/ACR/ASA/SCA/SCAI/SIR/STS/SVM Guidelines for the Diagnosis and Management of Patients with Thoracic Aortic Disease. Circulation. 2010; 121: F643-P295. Aortic aneurysm NOS (ICD10-I71.9) 5. Aortic Atherosclerosis (ICD10-I70.0). Electronically Signed   By: Titus Dubin M.D.   On: 11/12/2019 20:47   DG Chest Portable 1 View  Result Date: 11/12/2019 CLINICAL DATA:  Shortness of breath EXAM: PORTABLE CHEST 1 VIEW COMPARISON:  June 26, 2019 FINDINGS: The lungs are clear. Heart is mildly enlarged with pulmonary vascularity normal. Postoperative changes from ovary artery bypass grafting noted. Adenopathy. No bone lesions. IMPRESSION: Stable cardiac prominence.  Postoperative change.  Lungs clear. Electronically Signed   By: Lowella Grip III M.D.   On: 11/12/2019 17:03    Procedures Procedures (including critical care time)  Medications Ordered in ED Medications  iohexol (OMNIPAQUE) 350 MG/ML injection 60 mL (60 mLs Intravenous Contrast Given 11/12/19 2029)   furosemide (LASIX) injection 40 mg (40 mg Intravenous  Given 11/12/19 2119)    ED Course  I have reviewed the triage vital signs and the nursing notes.  Pertinent labs & imaging results that were available during my care of the patient were reviewed by me and considered in my medical decision making (see chart for details).    MDM Rules/Calculators/A&P                      86 year old male with history of diabetes, coronary artery disease, diastolic dysfunction, hypertension, prostate cancer, who presents with shortness of breath beginning on Thursday.  Differential diagnosis for dyspnea includes ACS, PE, COPD exacerbation, CHF exacerbation, anemia, pneumonia, viral etiology such as COVID 19 infection, metabolic abnormality.  Chest x-ray was done which showed no significant findings.  BNP was 447, more elevated than previous.   Troponin 38 to 49, do not feel rise is consistent with ACS and suspect more likely CHF related.  DDimer elevated and PE study done without evidence of PE but with findings consistent with CHF.  Has had some hallucinations per family and significant fatigue, O2 saturations down to 91% at rest on room air. Will admit for CHF exacerbation. Given lasix 40mg .     Dr. Hal Hope consulted. Cardiology to evaluate with concern for CHB noted on further evaluation of ECG.    Final Clinical Impression(s) / ED Diagnoses Final diagnoses:  Acute on chronic diastolic congestive heart failure [facility])    Rx / DC Orders ED Discharge Orders    None         Gareth Morgan, MD 11/12/19 2243

## 2019-11-12 NOTE — ED Notes (Signed)
Pt asks about "voices" coming from call button, son in law states patient is altered from baseline

## 2019-11-12 NOTE — ED Triage Notes (Signed)
Pt presents from [facility] for sob that started 3 days ago but is now worse today, was SOB with exertion, now cough and dyspnea at rest. Staff reports wheezing at facility that is abnormal for him, A&Ox3, x4 at baseline.   Pt has received his covid vaccinations.

## 2019-11-12 NOTE — ED Notes (Signed)
Please call pt son in law Lorayne Bender) - [phone] with updates

## 2019-11-12 NOTE — ED Notes (Signed)
Admitting MD at bedside.

## 2019-11-13 ENCOUNTER — Encounter (HOSPITAL_COMMUNITY): Payer: Self-pay | Admitting: Internal Medicine

## 2019-11-13 ENCOUNTER — Telehealth: Payer: Self-pay | Admitting: Cardiovascular Disease

## 2019-11-13 ENCOUNTER — Inpatient Hospital Stay (HOSPITAL_COMMUNITY): Payer: Medicare Other

## 2019-11-13 ENCOUNTER — Other Ambulatory Visit: Payer: Self-pay

## 2019-11-13 DIAGNOSIS — I5023 Acute on chronic systolic (congestive) heart failure: Secondary | ICD-10-CM

## 2019-11-13 DIAGNOSIS — I442 Atrioventricular block, complete: Secondary | ICD-10-CM

## 2019-11-13 DIAGNOSIS — I509 Heart failure, unspecified: Secondary | ICD-10-CM

## 2019-11-13 DIAGNOSIS — G9340 Encephalopathy, unspecified: Secondary | ICD-10-CM

## 2019-11-13 DIAGNOSIS — I5020 Unspecified systolic (congestive) heart failure: Secondary | ICD-10-CM

## 2019-11-13 LAB — TROPONIN I (HIGH SENSITIVITY)
Troponin I (High Sensitivity): 47 ng/L — ABNORMAL HIGH (ref <18)
Troponin I (High Sensitivity): 49 ng/L — ABNORMAL HIGH (ref <18)

## 2019-11-13 LAB — CBC WITH DIFFERENTIAL/PLATELET
Abs Immature Granulocytes: 0.01 10*3/uL (ref 0.00–0.07)
Basophils Absolute: 0.1 10*3/uL (ref 0.0–0.1)
Basophils Relative: 1 %
Eosinophils Absolute: 0.1 10*3/uL (ref 0.0–0.5)
Eosinophils Relative: 3 %
HCT: 42.7 % (ref 39.0–52.0)
Hemoglobin: 13.9 g/dL (ref 13.0–17.0)
Immature Granulocytes: 0 %
Lymphocytes Relative: 37 %
Lymphs Abs: 1.6 10*3/uL (ref 0.7–4.0)
MCH: 28.4 pg (ref 26.0–34.0)
MCHC: 32.6 g/dL (ref 30.0–36.0)
MCV: 87.3 fL (ref 80.0–100.0)
Monocytes Absolute: 0.4 10*3/uL (ref 0.1–1.0)
Monocytes Relative: 10 %
Neutro Abs: 2 10*3/uL (ref 1.7–7.7)
Neutrophils Relative %: 49 %
Platelets: 149 10*3/uL — ABNORMAL LOW (ref 150–400)
RBC: 4.89 MIL/uL (ref 4.22–5.81)
RDW: 15 % (ref 11.5–15.5)
WBC: 4.2 10*3/uL (ref 4.0–10.5)
nRBC: 0 % (ref 0.0–0.2)

## 2019-11-13 LAB — FOLATE: Folate: 14.9 ng/mL (ref >5.9)

## 2019-11-13 LAB — COMPREHENSIVE METABOLIC PANEL
ALT: 20 U/L (ref 0–44)
AST: 17 U/L (ref 15–41)
Albumin: 3.5 g/dL (ref 3.5–5.0)
Alkaline Phosphatase: 115 U/L (ref 38–126)
Anion gap: 15 (ref 5–15)
BUN: 15 mg/dL (ref 8–23)
CO2: 23 mmol/L (ref 22–32)
Calcium: 8.9 mg/dL (ref 8.9–10.3)
Chloride: 101 mmol/L (ref 98–111)
Creatinine, Ser: 1.31 mg/dL — ABNORMAL HIGH (ref 0.61–1.24)
GFR calc Af Amer: 53 mL/min — ABNORMAL LOW (ref >60)
GFR calc non Af Amer: 45 mL/min — ABNORMAL LOW (ref >60)
Glucose, Bld: 139 mg/dL — ABNORMAL HIGH (ref 70–99)
Potassium: 3.7 mmol/L (ref 3.5–5.1)
Sodium: 139 mmol/L (ref 135–145)
Total Bilirubin: 0.7 mg/dL (ref 0.3–1.2)
Total Protein: 6.8 g/dL (ref 6.5–8.1)

## 2019-11-13 LAB — IRON AND TIBC
Iron: 43 ug/dL — ABNORMAL LOW (ref 45–182)
Saturation Ratios: 15 % — ABNORMAL LOW (ref 17.9–39.5)
TIBC: 294 ug/dL (ref 250–450)
UIBC: 251 ug/dL

## 2019-11-13 LAB — ECHOCARDIOGRAM COMPLETE
Height: 70 in
Weight: 2336 oz

## 2019-11-13 LAB — CREATININE, SERUM
Creatinine, Ser: 1.31 mg/dL — ABNORMAL HIGH (ref 0.61–1.24)
GFR calc Af Amer: 53 mL/min — ABNORMAL LOW (ref >60)
GFR calc non Af Amer: 45 mL/min — ABNORMAL LOW (ref >60)

## 2019-11-13 LAB — RETICULOCYTES
Immature Retic Fract: 8.9 % (ref 2.3–15.9)
RBC.: 4.87 MIL/uL (ref 4.22–5.81)
Retic Count, Absolute: 70.1 10*3/uL (ref 19.0–186.0)
Retic Ct Pct: 1.4 % (ref 0.4–3.1)

## 2019-11-13 LAB — AMMONIA: Ammonia: 18 umol/L (ref 9–35)

## 2019-11-13 LAB — GLUCOSE, CAPILLARY
Glucose-Capillary: 82 mg/dL (ref 70–99)
Glucose-Capillary: 270 mg/dL — ABNORMAL HIGH (ref 70–99)

## 2019-11-13 LAB — FERRITIN: Ferritin: 67 ng/mL (ref 24–336)

## 2019-11-13 LAB — TSH: TSH: 1.954 u[IU]/mL (ref 0.350–4.500)

## 2019-11-13 LAB — MAGNESIUM: Magnesium: 1.8 mg/dL (ref 1.7–2.4)

## 2019-11-13 LAB — CBG MONITORING, ED
Glucose-Capillary: 133 mg/dL — ABNORMAL HIGH (ref 70–99)
Glucose-Capillary: 219 mg/dL — ABNORMAL HIGH (ref 70–99)

## 2019-11-13 LAB — VITAMIN B12: Vitamin B-12: 146 pg/mL — ABNORMAL LOW (ref 180–914)

## 2019-11-13 MED ORDER — MAGNESIUM SULFATE 2 GM/50ML IV SOLN
2.0000 g | Freq: Once | INTRAVENOUS | Status: AC
  Administered 2019-11-13: 2 g via INTRAVENOUS
  Filled 2019-11-13: qty 50

## 2019-11-13 MED ORDER — INSULIN ASPART 100 UNIT/ML ~~LOC~~ SOLN
0.0000 [IU] | Freq: Three times a day (TID) | SUBCUTANEOUS | Status: DC
  Administered 2019-11-13: 3 [IU] via SUBCUTANEOUS
  Administered 2019-11-13: 1 [IU] via SUBCUTANEOUS
  Administered 2019-11-14: 5 [IU] via SUBCUTANEOUS
  Administered 2019-11-14: 3 [IU] via SUBCUTANEOUS
  Administered 2019-11-15 (×2): 1 [IU] via SUBCUTANEOUS
  Administered 2019-11-15: 3 [IU] via SUBCUTANEOUS
  Administered 2019-11-16: 2 [IU] via SUBCUTANEOUS
  Administered 2019-11-16: 1 [IU] via SUBCUTANEOUS
  Administered 2019-11-16: 2 [IU] via SUBCUTANEOUS
  Administered 2019-11-17: 1 [IU] via SUBCUTANEOUS
  Administered 2019-11-17: 3 [IU] via SUBCUTANEOUS
  Administered 2019-11-17: 1 [IU] via SUBCUTANEOUS
  Administered 2019-11-18 (×2): 3 [IU] via SUBCUTANEOUS
  Administered 2019-11-19: 2 [IU] via SUBCUTANEOUS
  Administered 2019-11-20: 1 [IU] via SUBCUTANEOUS
  Administered 2019-11-20: 2 [IU] via SUBCUTANEOUS

## 2019-11-13 MED ORDER — HYDRALAZINE HCL 20 MG/ML IJ SOLN: 10.0000 mg | INTRAMUSCULAR | Status: DC | PRN

## 2019-11-13 MED ORDER — VITAMIN B-12 1000 MCG PO TABS
1000.0000 ug | ORAL_TABLET | Freq: Every day | ORAL | Status: AC
  Administered 2019-11-13 – 2019-11-19 (×7): 1000 ug via ORAL
  Filled 2019-11-13 (×7): qty 1

## 2019-11-13 MED ORDER — ENSURE ENLIVE PO LIQD
237.0000 mL | Freq: Every day | ORAL | Status: DC
  Administered 2019-11-13 – 2019-11-17 (×3): 237 mL via ORAL

## 2019-11-13 MED ORDER — INSULIN GLARGINE 100 UNIT/ML ~~LOC~~ SOLN
9.0000 [IU] | Freq: Every day | SUBCUTANEOUS | Status: DC
  Administered 2019-11-13 – 2019-11-20 (×8): 9 [IU] via SUBCUTANEOUS
  Filled 2019-11-13 (×8): qty 0.09

## 2019-11-13 MED ORDER — ADULT MULTIVITAMIN W/MINERALS CH
1.0000 | ORAL_TABLET | Freq: Every day | ORAL | Status: DC
  Administered 2019-11-13 – 2019-11-20 (×8): 1 via ORAL
  Filled 2019-11-13 (×8): qty 1

## 2019-11-13 MED ORDER — LATANOPROST 0.005 % OP SOLN
1.0000 [drp] | Freq: Every day | OPHTHALMIC | Status: DC
  Administered 2019-11-13 – 2019-11-19 (×5): 1 [drp] via OPHTHALMIC
  Filled 2019-11-13: qty 2.5

## 2019-11-13 MED ORDER — ISOSORBIDE DINITRATE 10 MG PO TABS
10.0000 mg | ORAL_TABLET | Freq: Three times a day (TID) | ORAL | Status: DC
  Administered 2019-11-13 – 2019-11-14 (×4): 10 mg via ORAL
  Filled 2019-11-13 (×6): qty 1

## 2019-11-13 MED ORDER — MIRABEGRON ER 25 MG PO TB24
25.0000 mg | ORAL_TABLET | Freq: Every day | ORAL | Status: DC
  Administered 2019-11-13 – 2019-11-20 (×8): 25 mg via ORAL
  Filled 2019-11-13 (×8): qty 1

## 2019-11-13 MED ORDER — LOSARTAN POTASSIUM 50 MG PO TABS
100.0000 mg | ORAL_TABLET | Freq: Every day | ORAL | Status: DC
  Administered 2019-11-13 – 2019-11-20 (×8): 100 mg via ORAL
  Filled 2019-11-13 (×8): qty 2

## 2019-11-13 MED ORDER — AMLODIPINE BESYLATE 5 MG PO TABS: 10.0000 mg | ORAL_TABLET | Freq: Every day | ORAL | Status: DC

## 2019-11-13 MED ORDER — FUROSEMIDE 10 MG/ML IJ SOLN
40.0000 mg | Freq: Two times a day (BID) | INTRAMUSCULAR | Status: DC
  Administered 2019-11-13 – 2019-11-14 (×3): 40 mg via INTRAVENOUS
  Filled 2019-11-13 (×3): qty 4

## 2019-11-13 MED ORDER — HYDRALAZINE HCL 25 MG PO TABS
25.0000 mg | ORAL_TABLET | Freq: Three times a day (TID) | ORAL | Status: DC
  Administered 2019-11-13 (×2): 25 mg via ORAL
  Filled 2019-11-13 (×2): qty 1

## 2019-11-13 MED ORDER — ASPIRIN 81 MG PO CHEW
81.0000 mg | CHEWABLE_TABLET | Freq: Every day | ORAL | Status: DC
  Administered 2019-11-13 – 2019-11-20 (×8): 81 mg via ORAL
  Filled 2019-11-13 (×8): qty 1

## 2019-11-13 MED ORDER — ENSURE ENLIVE PO LIQD: 237.0000 mL | Freq: Two times a day (BID) | ORAL | Status: DC

## 2019-11-13 MED ORDER — ENOXAPARIN SODIUM 30 MG/0.3ML ~~LOC~~ SOLN
30.0000 mg | Freq: Every day | SUBCUTANEOUS | Status: DC
  Administered 2019-11-13 – 2019-11-19 (×7): 30 mg via SUBCUTANEOUS
  Filled 2019-11-13 (×7): qty 0.3

## 2019-11-13 MED ORDER — TAMSULOSIN HCL 0.4 MG PO CAPS
0.4000 mg | ORAL_CAPSULE | Freq: Every day | ORAL | Status: DC
  Administered 2019-11-13 – 2019-11-20 (×8): 0.4 mg via ORAL
  Filled 2019-11-13 (×8): qty 1

## 2019-11-13 MED ORDER — POTASSIUM CHLORIDE CRYS ER 20 MEQ PO TBCR
40.0000 meq | EXTENDED_RELEASE_TABLET | Freq: Two times a day (BID) | ORAL | Status: AC
  Administered 2019-11-13 (×2): 40 meq via ORAL
  Filled 2019-11-13 (×2): qty 2

## 2019-11-13 MED ORDER — AMLODIPINE BESYLATE 5 MG PO TABS
5.0000 mg | ORAL_TABLET | Freq: Two times a day (BID) | ORAL | Status: DC
  Administered 2019-11-13 – 2019-11-20 (×14): 5 mg via ORAL
  Filled 2019-11-13 (×14): qty 1

## 2019-11-13 NOTE — ED Notes (Signed)
Patient noted up walking around in room with blanket around him. Patient had pull the male catheter off and had saturated clothes and bedding. Patient cleaned up and linen changed.

## 2019-11-13 NOTE — Progress Notes (Signed)
Initial Nutrition Assessment  RD working remotely.  DOCUMENTATION CODES:   Not applicable  INTERVENTION:   -Decrease Ensure Enlive po to daily, each supplement provides 350 kcal and 20 grams of protein -Magic cup TID with meals, each supplement provides 290 kcal and 9 grams of protein -MVI with minerals daily  NUTRITION DIAGNOSIS:   Increased nutrient needs related to chronic illness(ischemic cardiomyopathy) as evidenced by estimated needs.  GOAL:   Patient will meet greater than or equal to 90% of their needs  MONITOR:   PO intake, Supplement acceptance, Labs, Weight trends, Skin  REASON FOR ASSESSMENT:   Malnutrition Screening Tool    ASSESSMENT:   Leonard [name] is a 86 y.o. male with history of CAD status post CABG, ischemic cardiomyopathy last EF measured in 2018 was 45 to 50%, hypertension diabetes mellitus type 2, chronic kidney disease who was admitted in January of this year for Covid 19 infection was noticed to have increasing shortness of breath over the last 3 days by patient's family.  Patient did not have any chest pain or fever or chills but has been having some cough.  Patient was also noticed to be increasingly confused yesterday.  Pt admitted with CHF and complete heart block with AV dissociation.   Reviewed I/O's: -1.1 L x 24 hours  UOP: 1.1 L x 24 hours  Attempted to speak with pt via phone, however, no answer. Per chart review, pt with encephalopathy and possible dementia.   No meal completion records available to assess at this time.   Per chart review, pt was residing at [facility] memory care P[facility].   Reviewed wt hx; wt has been stable over the past 5 months.   Medications reviewed and include lasix and vitamin B-12.  Lab Results  Component Value Date   HGBA1C 8.7 (H) 06/27/2019   PTA DM medications are 9 units insulin glargine daily and 0-7 units insulin lispro TID with meals. Per ADA's Standards of Medical Care of Diabetes, glycemic  targets for older adults who have multiple co-morbidities, cognitive impairments, and functional dependence should be less stringent (Hgb A1c <8.0-8.5).   Labs reviewed: CBGS: 563-875 (inpatient orders for glycemic control are 0-9 units insulin aspart TID with meals and 9 units insulin glargine daily).   Diet Order:   Diet Order            Diet heart healthy/carb modified Room service appropriate? Yes; Fluid consistency: Thin; Fluid restriction: 1200 mL Fluid  Diet effective now              EDUCATION NEEDS:   No education needs have been identified at this time  Skin:  Skin Assessment: Reviewed RN Assessment  Last BM:  Unknown  Height:   Ht Readings from Last 1 Encounters:  11/13/19 5\' 4"  (1.626 m)    Weight:   Wt Readings from Last 1 Encounters:  11/13/19 63 kg    Ideal Body Weight:  54.5 kg  BMI:  Body mass index is 23.86 kg/m.  Estimated Nutritional Needs:   Kcal:  1600-1800  Protein:  75-90 grams  Fluid:  1.2 L    Loistine Chance, RD, LDN, [place] Registered Dietitian II Certified Diabetes Care and Education Specialist Please refer to [facility] for RD and/or RD on-call/weekend/after hours pager

## 2019-11-13 NOTE — Progress Notes (Signed)
Echocardiogram 2D Echocardiogram has been performed.  [place] Deputy Elain Wixon 11/13/2019, 1:52 PM

## 2019-11-13 NOTE — Consult Note (Addendum)
Cardiology Consult:   Patient ID: Leonard [name]; MRN: [id number]; DOB: 1920-08-31   Admission date: 11/12/2019  Primary Care Provider: Hoyt Koch, MD Primary Cardiologist: No primary care provider on file.  Dr Johnsie Cancel 04/01/2017 Primary Electrophysiologist: None    Chief Complaint:  bradycardia  Patient Profile:   Leonard [name] is a 86 y.o. male with a history of CABG [name], Leonard [name], Leonard [name], Leonard [name], Leonard [name], Leonard [name], Leonard [name], Leonard [name], Leonard [name], Leonard [name], Leonard [name], Leonard [name], Leonard PNA 06/2019.   He was admitted 06/07 w/ SOB, CHB, cards asked to see.   10/06/2011, seen by Dr [name] for Leonard, ECG was sinus brady, Dr [name] felt Leonard related to mental status issues +/- hypoglycemia +/- neuro issues. No further workup performed.   History of Present Illness:   Mr. Leonard [name] came to the ER 06/07 from [facility] memory unit with SOB, cough, increased confusion. Pt in CHB, cards asked to see.   Mr Leonard [name] is oriented to name, place, month. Day slightly off. He initially c/o SOB and some weakness starting last Thursday. At this time, he c/o some sx that may be weakness, but denies SOB, CP.  He has had some RLE edema, no clear orthopnea or PND.   Says has fallen, but not recently. Says normally uses a walker for ambulation, says will walk all around, but not in the last few days. Denies any hx exertional chest pain.   Says the problems he was having are that if he moved or turned suddenly, he would fall. That is better on the walker. He says he was using the walker this am and wonders where it is.   Currently resting comfortably, in no distress.    Past Medical History:  Diagnosis Date  . Bladder tumor    hematuria  . Leonard stenosis, bilateral 08/20/2009   mild  . Coronary artery disease    A. S/[name] CABG w/ Redo CABG in 1997;  b. 2005 Leonard: LIMA->LAD [name], VG->OM [name], VG->RPDA->RPLV [name]. Severe  native 3VD.  Marland Kitchen DDD (degenerative disc disease), lumbar    s/[name] fusion 4-5  . Diabetes mellitus    oral med and insulin  . Diabetic peripheral neuropathy (HCC)    feet  . Diastolic dysfunction    a. 05/2011 Echo: EF 55%, Gr1 DD, mild to mod MR, mildly to mod dil LA, mild TR.  . Leonard [name] bilateral    eye drop rx  . History of hiatal hernia    denies reflux  . HOH (hard of hearing) no hearing aids  . Hypertension   . Mass of skin of right shoulder 03/12/2017  . Nocturia   . Leonard cancer [facility]) 1996   s/[name] external radiation  . Renal insufficiency hx  . Leonard 06/06/2011   In context of dehydration and bradycardia.  . Thrombocytopenia ([place]) 03/12/2017    Past Surgical History:  Procedure Laterality Date  . CARDIAC CATHETERIZATION  last Leonard 2005   per dr Olevia Perches- w/ chart (grafts patent)-  results w/ chart  . CATARACT EXTRACTION W/ INTRAOCULAR LENS  IMPLANT, BILATERAL    . CATARACT EXTRACTION W/PHACO Bilateral    Dr. Gershon Crane  . CORONARY ARTERY BYPASS GRAFT  1984   4 vessels  . CORONARY ARTERY BYPASS GRAFT  1997- redo   3 vessels  . LIPOMA EXCISION  1970's   from back  . LUMBAR FUSION  01/2004   L 4-5  .  TRANSURETHRAL RESECTION OF BLADDER TUMOR  04/16/2011   Procedure: TRANSURETHRAL RESECTION OF BLADDER TUMOR (TURBT);  Surgeon: Claybon Jabs;  Location: [place];  Service: Urology;  Laterality: N/A;  . UPPER GASTROINTESTINAL ENDOSCOPY     dx hiatal hernia     Medications Prior to Admission: Prior to Admission medications   Medication Sig Start Date End Date Taking? Authorizing Provider  amLODipine (NORVASC) 10 MG tablet TAKE 1 TABLET BY MOUTH EVERY DAY Patient taking differently: Take 10 mg by mouth daily.  04/19/17  Yes Hoyt Koch, MD  aspirin 81 MG tablet Take 81 mg by mouth daily.    Yes [provider]  diclofenac sodium (VOLTAREN) 1 % GEL Apply 2 g topically 2 (two) times daily as needed. Patient taking differently: Apply 2 g  topically 2 (two) times daily. Joints of right finger 02/04/17  Yes Rosemarie Ax, MD  gabapentin (NEURONTIN) 600 MG tablet Take 600 mg by mouth 3 (three) times daily.     Yes [provider]  Insulin Glargine (BASAGLAR KWIKPEN) 100 UNIT/ML SOPN Inject 9 Units into the skin daily.   Yes [provider]  insulin lispro (HUMALOG) 100 UNIT/ML injection Inject 0-7 Units into the skin 3 (three) times daily before meals. 0-180 0 units 181-240 3 units 241-300 4 units 301-360 5 units 361-420 6 units 421+ 7 units   Yes [provider]  Insulin Pen Needle 32G X 4 MM MISC As directed per insulin dose and sliding scale Patient taking differently: 1 each by Other route See admin instructions. As directed per insulin dose and sliding scale 02/04/17  Yes Rosemarie Ax, MD  isosorbide dinitrate (ISORDIL) 10 MG tablet Take 1 tablet (10 mg total) by mouth 3 (three) times daily. 03/13/17  Yes Samuella Cota, MD  latanoprost (XALATAN) 0.005 % ophthalmic solution Place 1 drop into both eyes at bedtime.   Yes [provider]  losartan (COZAAR) 100 MG tablet Take 1 tablet (100 mg total) by mouth daily. Overdue for annual appt w/labs must see MD for refills Patient taking differently: Take 100 mg by mouth daily.  11/17/16  Yes Hoyt Koch, MD  meclizine (ANTIVERT) 12.5 MG tablet Take 12.5 mg by mouth 3 (three) times daily as needed for dizziness.   Yes [provider]  mirabegron ER (MYRBETRIQ) 25 MG TB24 tablet Take 25 mg by mouth daily.   Yes [provider]  Omega 3 1200 MG CAPS Take 1,200 mg by mouth 2 (two) times daily.   Yes [provider]  Tamsulosin HCl (FLOMAX) 0.4 MG CAPS Take 0.4 mg by mouth daily.   Yes [provider]     Allergies:    Allergies  Allergen Reactions  . Codeine Nausea And Vomiting and Other (See Comments)    hallucinations  . Microzide [Hydrochlorothiazide] Other (See Comments)    Acute gout on  LosartanHCT 12/2014  . Ace Inhibitors Other (See Comments)     cough    Social History:   Social History   Socioeconomic History  . Marital status: Widowed    Spouse name: Not on file  . Number of children: Not on file  . Years of education: Not on file  . Highest education level: Not on file  Occupational History  . Occupation: retired  Tobacco Use  . Smoking status: Never Smoker  . Smokeless tobacco: Never Used  Substance and Sexual Activity  . Alcohol use: No  . Drug use:  No  . Sexual activity: Yes    Birth control/protection: None  Other Topics Concern  . Not on file  Social History Narrative  . Not on file   Social Determinants of Health   Financial Resource Strain:   . Difficulty of Paying Living Expenses:   Food Insecurity:   . Worried About Charity fundraiser in the Last Year:   . Arboriculturist in the Last Year:   Transportation Needs:   . Film/video editor (Medical):   Marland Kitchen Lack of Transportation (Non-Medical):   Physical Activity:   . Days of Exercise per Week:   . Minutes of Exercise per Session:   Stress:   . Feeling of Stress :   Social Connections:   . Frequency of Communication with Friends and Family:   . Frequency of Social Gatherings with Friends and Family:   . Attends Religious Services:   . Active Member of Clubs or Organizations:   . Attends Archivist Meetings:   Marland Kitchen Marital Status:   Intimate Partner Violence:   . Fear of Current or Ex-Partner:   . Emotionally Abused:   Marland Kitchen Physically Abused:   . Sexually Abused:     Family History:  The patient's family history includes Diabetes in his sister. There is no history of Cancer, Heart disease, or Stroke.   The patient He indicated that the status of his sister is unknown. He indicated that the status of his neg hx is unknown.  ROS:  Please see the history of present illness.  All other ROS reviewed and negative.     Physical Exam/Data:   Vitals:   11/13/19 1330 11/13/19  1345 11/13/19 1400 11/13/19 1430  BP: (!) 152/79 (!) 165/87 (!) 160/119 (!) 158/97  Pulse:   89 71  Resp:   13 20  Temp:    98.5 F (36.9 C)  TempSrc:    Oral  SpO2:   95% 100%  Weight:    63 kg  Height:    5\' 4"  (1.626 m)    Intake/Output Summary (Last 24 hours) at 11/13/2019 1508 Last data filed at 11/13/2019 0937 Gross per 24 hour  Intake 0 ml  Output 1950 ml  Net -1950 ml   Filed Weights   11/12/19 1558 11/13/19 1430  Weight: 66.2 kg 63 kg   Body mass index is 23.86 kg/m.  General:  Well nourished, well developed, elderly male in no acute distress HEENT: normal Lymph: no adenopathy Neck:  JVD not elevated Endocrine:  No thryomegaly Vascular: No Leonard bruits; 4/4 extremity pulses 2+ bilaterally  Cardiac:  normal S1, S2; RRR; no murmur, no rub or gallop  Lungs: decreased BS bases bilaterally, no wheezing, rhonchi or rales  Abd: soft, nontender, no hepatomegaly  Ext: trace edema Musculoskeletal:  No deformities, BUE and BLE strength normal for age and equal Skin: warm and dry  Neuro:  CNs 2-12 intact, no focal abnormalities noted Psych:  Normal affect    EKG:  The ECG was personally reviewed: 06/07 ECG is CHB w/ junctional escape, HR 61, RBBB 06/08 ECG is SR, 1st degree AVB w/ PR 404 ms, RBBB 06/26/2019 ECG is SR, PR 408 ms, RBBB Telemetry: pt not on telemetry in the ER SR w/ long 1st deg block on 3E  Relevant CV Studies:  ECHO: 11/13/2019 done, results pending  ECHO: 03/12/2017 - Left ventricle: The cavity size was normal. There was mild  concentric hypertrophy. Systolic function was mildly reduced. The  estimated ejection fraction was in the range of 45% to 50%. Mild  diffuse hypokinesis. Features are consistent with a pseudonormal  left ventricular filling pattern, with concomitant abnormal  relaxation and increased filling pressure (grade 2 diastolic  dysfunction). Doppler parameters are consistent with high  ventricular filling pressure.    - Aortic valve: Transvalvular velocity was within the normal range.  There was no stenosis. There was no regurgitation.  - Mitral valve: Severely calcified annulus. Transvalvular velocity  was within the normal range. There was no evidence for stenosis.  There was mild regurgitation.  - Left atrium: The atrium was severely dilated.  - Right ventricle: The cavity size was normal. Wall thickness was  normal. Systolic function was normal.  - Right atrium: The atrium was moderately dilated.  - Tricuspid valve: There was mild regurgitation.   MYOVIEW: 03/12/2017  There was no ST segment deviation noted during stress.  T wave inversion was noted during stress.  Findings consistent with ischemia.  This is an intermediate risk study.  The left ventricular ejection fraction is moderately decreased (30-44%).   Small area of inferior and lateral wall ischemia at mid and basal level SDS 4  EF estimated at 36% with diffuse hypokinesis suggest echo correlation  Leonard: 12/06/2003 LEFT MAIN CORONARY ARTERY:  Left main coronary artery has 70% stenosis in  its midportion.   LEFT ANTERIOR DESCENDING ARTERY:  Left anterior descending artery is  completed occluded in its origin.   CIRCUMFLEX ARTERY:  The circumflex artery gave rise to an ramus branch and  then was completely occluded.  The ramus branch had a 70% proximal stenosis.   RIGHT CORONARY ARTERY:  The right coronary artery is __________is completely  occluded in its midportion.   The saphenous vein graft to the posterior descending and posterolateral  branch of the right coronary artery was patent and functioned normal.   The saphenous vein graft to the marginal branch of circumflex artery was  patent and functioned normally.   The LIMA graft to the LAD was patent and functioned normal.  There was no  significant distal disease in the LAD.  There was 95% stenosis proximally at  the insertion site which compromised 2  small diagonal branches.   LEFT VENTRICULOGRAM:  The left ventriculogram performed in the RAO  projection showed good wall motion with no of hypokinesis. The estimated  ejection fraction was 55%.   DISTAL AORTOGRAM:  A distal aortogram was performed which showed patent  renal arteries and no significant aortoiliac obstruction.   CONCLUSIONS:  1. Coronary artery disease status post redo bypass surgery in 1997.  2. Severe native vessel disease with 70% stenosis of the left main coronary     artery, total occlusion of the left anterior descending and right     coronary artery, and 70% stenosis in the ramus branch with total     occlusion of the circumflex artery after the ramus branch.  3. Patent sequential vein graft to the posterior descending and     posterolateral branches of the right coronary artery.  4. Patent vein graft to the marginal branch of the circumflex artery.  5. Patent LIMA graft to the LAD.  6. Normal LV function.   RECOMMENDATIONS:  This patient's grafts are all patent.  His only potential  source of ischemia is in 2 small diagonal branches proximal to the insertion  of the LIMA graft.  His outlook should be good from a cardiac standpoint and  I think that  his optimal risk for a lumbar spine surgery should not be  greatly increased.  Laboratory Data:  Chemistry Recent Labs  Lab 11/12/19 1622 11/13/19 0804  NA 135 139  K 4.7 3.7  CL 106 101  CO2 19* 23  GLUCOSE 155* 139*  BUN 18 15  CREATININE 1.33* 1.31*  1.31*  CALCIUM 8.2* 8.9  GFRNONAA 44* 45*  45*  GFRAA 52* 53*  53*  ANIONGAP 10 15    Recent Labs  Lab 11/12/19 1622 11/13/19 0804  PROT 5.9* 6.8  ALBUMIN 3.3* 3.5  AST 25 17  ALT 15 20  ALKPHOS 102 115  BILITOT 1.2 0.7   Hematology Recent Labs  Lab 11/12/19 1622 11/13/19 0804  WBC 3.7* 4.2  RBC 4.46 4.89  4.87  HGB 12.8* 13.9  HCT 39.1 42.7  MCV 87.7 87.3  MCH 28.7 28.4  MCHC 32.7 32.6  RDW 14.9 15.0  [name] 101* 149*    Cardiac Enzymes  High Sensitivity Troponin:   Recent Labs  Lab 11/12/19 1622 11/12/19 1816 11/13/19 0804 11/13/19 1218  TROPONINIHS 38* 49* 47* 49*     BNP Recent Labs  Lab 11/12/19 1630  BNP 447.9*    DDimer  Recent Labs  Lab 11/12/19 1745  DDIMER 1.67*   Lipids:  Lab Results  Component Value Date   CHOL 136 03/11/2017   HDL 46 03/11/2017   LDLCALC 73 03/11/2017   TRIG 98 06/26/2019   CHOLHDL 3.0 03/11/2017   A1c:  Lab Results  Component Value Date   HGBA1C 8.7 (H) 06/27/2019   Thyroid:  Lab Results  Component Value Date   TSH 1.954 11/13/2019    Radiology/Studies:  CT HEAD WO CONTRAST  Result Date: 11/13/2019 CLINICAL DATA:  Encephalopathy EXAM: CT HEAD WITHOUT CONTRAST TECHNIQUE: Contiguous axial images were obtained from the base of the skull through the vertex without intravenous contrast. COMPARISON:  CT 05/22/2017 FINDINGS: Brain: No evidence of acute infarction, hemorrhage, hydrocephalus, extra-axial collection or mass lesion/mass effect. Symmetric prominence of the ventricles, cisterns and sulci compatible with moderate and slightly frontal predominant parenchymal volume loss. Patchy areas of white matter hypoattenuation are most compatible with moderate chronic microvascular angiopathy. Partially empty appearance of the sella similar to prior. Vascular: Atherosclerotic calcification of the Leonard siphons and intradural vertebral arteries. No hyperdense vessel. Skull: No significant swelling or hematoma. No calvarial fracture or suspicious osseous lesion. Sinuses/Orbits: Postsurgical changes from prior bilateral maxillary antrostomy. Subtotal opacification of the left maxillary sinus with hyperostotic changes of the walls suggesting chronicity. No abnormal stranding or destructive changes along the sinus perimeter. Chronic leftward nasal septal deviation. Orbital structures are unremarkable aside from prior lens extractions. Other: Bilateral TMJ arthrosis,  left greater than right IMPRESSION: 1. No acute intracranial findings. 2. Moderate and slightly frontal predominant parenchymal volume loss and chronic microvascular angiopathy. 3. Postsurgical changes from prior bilateral maxillary antrostomy. Subtotal opacification of the left maxillary sinus with hyperostotic changes of the walls suggesting chronicity. No abnormal stranding or destructive changes along the sinus perimeter. 4. Bilateral TMJ arthrosis, left greater than right. Electronically Signed   By: Lovena Le M.D.   On: 11/13/2019 01:00   CT Angio Chest PE W and/or Wo Contrast  Result Date: 11/12/2019 CLINICAL DATA:  Progressively worsening shortness of breath over the past 3 days. EXAM: CT ANGIOGRAPHY CHEST WITH CONTRAST TECHNIQUE: Multidetector CT imaging of the chest was performed using the standard protocol during bolus administration of intravenous contrast. Multiplanar CT image reconstructions and MIPs were  obtained to evaluate the vascular anatomy. CONTRAST:  101mL OMNIPAQUE IOHEXOL 350 MG/ML SOLN COMPARISON:  Chest x-ray from same day. CT chest dated April 28, 2011. FINDINGS: Cardiovascular: Satisfactory opacification of the pulmonary arteries to the segmental level. No evidence of pulmonary embolism. Unchanged cardiomegaly. No pericardial effusion. Prior CABG. New mild aneurysmal dilatation of the ascending thoracic aorta, measuring up to 4.0 cm. Coronary, aortic arch, and branch vessel atherosclerotic vascular disease. Mediastinum/Nodes: Mildly enlarged mediastinal and bilateral hilar lymph nodes measuring up to 1.3 cm in short axis, likely reactive. Unchanged calcified subcarinal lymph node. No enlarged axillary lymph nodes. The thyroid gland, trachea, and esophagus demonstrate no significant findings. Lungs/Pleura: Small right greater than left pleural effusions. Mild interlobular septal thickening at the lung bases. Severe central peribronchial thickening. Linear scarring/atelectasis in  the posterior right upper lobe. Dependent subsegmental atelectasis in the posterior right middle lobe and both lower lobes. Calcified granuloma in the lingula. No pneumothorax. Upper Abdomen: No acute abnormality. Reflux of contrast into the IVC and hepatic veins. Musculoskeletal: No chest wall abnormality. No acute or significant osseous findings. Review of the MIP images confirms the above findings. IMPRESSION: 1. No evidence of pulmonary embolism. 2. Findings consistent with congestive heart failure with small right greater than left pleural effusions and mild interstitial pulmonary edema. Evidence of right heart dysfunction. 3. Severe central peribronchial thickening, consistent with infectious or inflammatory bronchitis. 4. New ascending thoracic aortic aneurysm measuring up to 4.0 cm. Consider annual imaging followup by CTA or MRA as clinically indicated given the patient's age. This recommendation follows 2010 ACCF/AHA/AATS/ACR/ASA/SCA/SCAI/SIR/STS/SVM Guidelines for the Diagnosis and Management of Patients with Thoracic Aortic Disease. Circulation. 2010; 121: N235-T732. Aortic aneurysm NOS (ICD10-I71.9) 5. Aortic Atherosclerosis (ICD10-I70.0). Electronically Signed   By: Titus Dubin M.D.   On: 11/12/2019 20:47   DG Chest Portable 1 View  Result Date: 11/12/2019 CLINICAL DATA:  Shortness of breath EXAM: PORTABLE CHEST 1 VIEW COMPARISON:  June 26, 2019 FINDINGS: The lungs are clear. Heart is mildly enlarged with pulmonary vascularity normal. Postoperative changes from ovary artery bypass grafting noted. Adenopathy. No bone lesions. IMPRESSION: Stable cardiac prominence.  Postoperative change.  Lungs clear. Electronically Signed   By: Lowella Grip III M.D.   On: 11/12/2019 17:03    Assessment and Plan:   1  Rhythm Pt has evidence of conduction [name] with First degree AV block, Wenkebach, RBBB, LAFB    I do not see evid of complete heart block      I have reviewed with pt   He denies dizziness    No Leonard   I would follow on telemetry    Avoid agents that would slow HR No indication for PPM at this time   2. CHF Pt with chronic systolic CHF    Echo done shows LVEF in 40s , mild RV dysfunction   I have compared to echo from 2018 and there is no significant change  The pt admits to adding salt to some of his food and this could have contributed to fluid retention   He has done this for years   Does not want to change    He has diuresed some since admit   Still with evid of mild volume increase on exam WOuld manage with lasix, adjusting as needed both in and as an outpt  -3. CAD Last myovue in 2018 showed some ischemia plan for  Medial Rx - pta on ASA 81 mg, no BB 2nd Leonard HR, Losartan 100 mg qd -  not on statin - on isordil 10 mg tid -Pt is without symptoms     4. Leonard [name] - BP elevated - home rx includes losartan and amlodipine    On admit amlodipine was stopped and hydralazine was started I am very hesitant in 86 yo to make abrupt changes   He had been on amlodipine for years   I would resume   Need to be careful with hydralazine as it may be too vasodilatory at 97 I would start at lower doses and increase very slowly as needed    Otherwise, per IM Principal Problem:   Acute CHF (congestive heart failure) (HCC) Active Problems:   Uncontrolled type 2 diabetes with renal manifestation (HCC)   Essential hypertension   Chronic renal insufficiency, stage III (moderate)   CHF (congestive heart failure) (HCC)   Complete heart block ([place])   Acute encephalopathy     For questions or updates, please contact [facility] Please consult [email or web address] for contact info under Cardiology/STEMI.    Signed, Rosaria Ferries, PA-C  11/13/2019 3:08 PM   Pt seen and examined  I have amended note above to reflect my findings    Pt 86 yo with CAD, mod LV dysfunction and significant conduction [name]   Presents with SOB   ON exam: Pt comfortable in bed Neck:  JVP is not elevated Lungs  are relatively clear   No rales  Some decreased airflow Cardiac RRR   No S3  No signif murmurs Ext with tr edema  I have added/amended to assessment above to reflect my findings  Will continue to follow   Dorris Carnes MD

## 2019-11-13 NOTE — H&P (Addendum)
History and Physical    Leonard [name]:[id number] DOB: [birth year] DOA: 11/12/2019  PCP: Leonard Koch, MD  Patient coming from: [facility] facility.  History obtained from patient's daughter-in-law and son.  Chief Complaint: Shortness of breath.  HPI: Leonard [name] is a 86 y.o. male with history of CAD status post CABG, ischemic cardiomyopathy last EF measured in 2018 was 11 to 50%, hypertension diabetes mellitus type 2, chronic kidney disease who was admitted in January of this year for Covid 19 infection was noticed to have increasing shortness of breath over the last 3 days by patient's family.  Patient did not have any chest pain or fever or chills but has been having some cough.  Patient was also noticed to be increasingly confused yesterday.  ED Course: In the ER patient was not in distress but appeared mildly hypoxic with EKG showing A-V dissociation/complete heart block but heart rate was in the 60s.  CT angiogram of the chest was done because of elevated D-dimer which showed features consistent with CHF.  Also was noticed for possible bronchitis.  Labs show BNP of 447 high sensitive troponin was 38 and 49.  CBC shows new pancytopenia.  Creatinine is at baseline.  Patient was given Lasix 40 mg IV admitted for acute CHF.  Patient's confusion is marginally improved at the ER is able to tell where he is and was moving all extremities.  CT head shows chronic findings.  For the complete heart block cardiology was notified.  Review of Systems: As per HPI, rest all negative.   Past Medical History:  Diagnosis Date  . Bladder tumor    hematuria  . Carotid stenosis, bilateral per doppler 08-20-09   mild  . Coronary artery disease    A. S/P CABG w/ Redo CABG in 1997;  b. 2005 Cath: LIMA->LAD ok, VG->OM ok, VG->RPDA->RPLV ok. Severe native 3VD.  Marland Kitchen DDD (degenerative disc disease), lumbar    s/p fusion 4-5  . Diabetes mellitus    oral med and insulin  . Diabetic peripheral  neuropathy (HCC)    feet  . Diastolic dysfunction    a. 05/2011 Echo: EF 55%, Gr1 DD, mild to mod MR, mildly to mod dil LA, mild TR.  . Glaucoma bilateral    eye drop rx  . History of hiatal hernia    denies reflux  . HOH (hard of hearing) no hearing aids  . Hypertension   . Mass of skin of right shoulder 03/12/2017  . Nocturia   . Prostate cancer [facility]) 1996   s/p external radiation  . Renal insufficiency hx  . Syncope 06/06/2011   In context of dehydration and bradycardia.  . Thrombocytopenia ([place]) 03/12/2017    Past Surgical History:  Procedure Laterality Date  . CARDIAC CATHETERIZATION  last cath 2005   per dr Leonard [name]- w/ chart (grafts patent)-  results w/ chart  . CATARACT EXTRACTION W/ INTRAOCULAR LENS  IMPLANT, BILATERAL    . CATARACT EXTRACTION W/PHACO Bilateral    Dr. Gershon [name]  . CORONARY ARTERY BYPASS GRAFT  1984   4 vessels  . CORONARY ARTERY BYPASS GRAFT  1997- redo   3 vessels  . LIPOMA EXCISION  1970's   from back  . LUMBAR FUSION  01/2004   L 4-5  . TRANSURETHRAL RESECTION OF BLADDER TUMOR  04/16/2011   Procedure: TRANSURETHRAL RESECTION OF BLADDER TUMOR (TURBT);  Surgeon: Leonard [name];  Location: [facility];  Service: Urology;  Laterality: N/A;  .  UPPER GASTROINTESTINAL ENDOSCOPY     dx hiatal hernia     reports that he has never smoked. He has never used smokeless tobacco. He reports that he does not drink alcohol or use drugs.  Allergies  Allergen Reactions  . Codeine Nausea And Vomiting and Other (See Comments)    hallucinations  . Microzide [Hydrochlorothiazide] Other (See Comments)    Acute gout on LosartanHCT 12/2014  . Ace Inhibitors Other (See Comments)     cough    Family History  Problem Relation Age of Onset  . Diabetes Sister   . Cancer Neg Hx   . Heart disease Neg Hx   . Stroke Neg Hx     Prior to Admission medications   Medication Sig Start Date End Date Taking? Authorizing Provider  amLODipine (NORVASC) 10 MG  tablet TAKE 1 TABLET BY MOUTH EVERY DAY Patient taking differently: Take 10 mg by mouth daily.  04/19/17  Yes Leonard Koch, MD  aspirin 81 MG tablet Take 81 mg by mouth daily.    Yes [provider]  diclofenac sodium (VOLTAREN) 1 % GEL Apply 2 g topically 2 (two) times daily as needed. Patient taking differently: Apply 2 g topically 2 (two) times daily. Joints of right finger 02/04/17  Yes Leonard Ax, MD  gabapentin (NEURONTIN) 600 MG tablet Take 600 mg by mouth 3 (three) times daily.     Yes [provider]  Insulin Glargine (BASAGLAR KWIKPEN) 100 UNIT/ML SOPN Inject 9 Units into the skin daily.   Yes [provider]  insulin lispro (HUMALOG) 100 UNIT/ML injection Inject 0-7 Units into the skin 3 (three) times daily before meals. 0-180 0 units 181-240 3 units 241-300 4 units 301-360 5 units 361-420 6 units 421+ 7 units   Yes [provider]  Insulin Pen Needle 32G X 4 MM MISC As directed per insulin dose and sliding scale Patient taking differently: 1 each by Other route See admin instructions. As directed per insulin dose and sliding scale 02/04/17  Yes Leonard Ax, MD  isosorbide dinitrate (ISORDIL) 10 MG tablet Take 1 tablet (10 mg total) by mouth 3 (three) times daily. 03/13/17  Yes Leonard Cota, MD  latanoprost (XALATAN) 0.005 % ophthalmic solution Place 1 drop into both eyes at bedtime.   Yes [provider]  losartan (COZAAR) 100 MG tablet Take 1 tablet (100 mg total) by mouth daily. Overdue for annual appt w/labs must see MD for refills Patient taking differently: Take 100 mg by mouth daily.  11/17/16  Yes Leonard Koch, MD  meclizine (ANTIVERT) 12.5 MG tablet Take 12.5 mg by mouth 3 (three) times daily as needed for dizziness.   Yes [provider]  mirabegron ER (MYRBETRIQ) 25 MG TB24 tablet Take 25 mg by mouth daily.   Yes [provider]  Omega 3 1200 MG CAPS Take 1,200 mg by mouth 2  (two) times daily.   Yes [provider]  Tamsulosin HCl (FLOMAX) 0.4 MG CAPS Take 0.4 mg by mouth daily.   Yes [provider]    Physical Exam: Constitutional: Moderately built and nourished. Vitals:   11/12/19 2030 11/12/19 2115 11/12/19 2200 11/12/19 2236  BP:   (!) 173/96   Pulse: 63 64 73 70  Resp: (!) 21 19 (!) 21 19  Temp:      TempSrc:      SpO2: 96% 94% 95% 96%  Weight:      Height:  Eyes: Anicteric no pallor. ENMT: No discharge from the ears eyes nose or mouth. Neck: No mass felt.  No neck rigidity. Respiratory: No rhonchi or crepitations. Cardiovascular: S1-S2 heard. Abdomen: Soft nontender bowel sounds present. Musculoskeletal: No edema. Skin: No rash. Neurologic: Alert awake oriented to his name and place.  Moves all extremities. Psychiatric: Oriented to name and place.   Labs on Admission: I have personally reviewed following labs and imaging studies  CBC: Recent Labs  Lab 11/12/19 1622  WBC 3.7*  NEUTROABS 1.3*  HGB 12.8*  HCT 39.1  MCV 87.7  PLT 122*   Basic Metabolic Panel: Recent Labs  Lab 11/12/19 1622  NA 135  K 4.7  CL 106  CO2 19*  GLUCOSE 155*  BUN 18  CREATININE 1.33*  CALCIUM 8.2*   GFR: Estimated Creatinine Clearance: 29.7 mL/min (A) (by C-G formula based on SCr of 1.33 mg/dL (H)). Liver Function Tests: Recent Labs  Lab 11/12/19 1622  AST 25  ALT 15  ALKPHOS 102  BILITOT 1.2  PROT 5.9*  ALBUMIN 3.3*   No results for input(s): LIPASE, AMYLASE in the last 168 hours. No results for input(s): AMMONIA in the last 168 hours. Coagulation Profile: No results for input(s): INR, PROTIME in the last 168 hours. Cardiac Enzymes: No results for input(s): CKTOTAL, CKMB, CKMBINDEX, TROPONINI in the last 168 hours. BNP (last 3 results) No results for input(s): PROBNP in the last 8760 hours. HbA1C: No results for input(s): HGBA1C in the last 72 hours. CBG: Recent Labs  Lab 11/12/19 1838  GLUCAP 111*    Lipid Profile: No results for input(s): CHOL, HDL, LDLCALC, TRIG, CHOLHDL, LDLDIRECT in the last 72 hours. Thyroid Function Tests: No results for input(s): TSH, T4TOTAL, FREET4, T3FREE, THYROIDAB in the last 72 hours. Anemia Panel: No results for input(s): VITAMINB12, FOLATE, FERRITIN, TIBC, IRON, RETICCTPCT in the last 72 hours. Urine analysis:    Component Value Date/Time   COLORURINE YELLOW 05/22/2017 1405   APPEARANCEUR HAZY (A) 05/22/2017 1405   LABSPEC 1.021 05/22/2017 1405   PHURINE 6.0 05/22/2017 1405   GLUCOSEU 50 (A) 05/22/2017 1405   HGBUR SMALL (A) 05/22/2017 1405   HGBUR negative 02/28/2008 0751   BILIRUBINUR NEGATIVE 05/22/2017 1405   KETONESUR 5 (A) 05/22/2017 1405   PROTEINUR 100 (A) 05/22/2017 1405   UROBILINOGEN 0.2 07/10/2014 0039   NITRITE NEGATIVE 05/22/2017 1405   LEUKOCYTESUR NEGATIVE 05/22/2017 1405   Sepsis Labs: @LABRCNTIP (procalcitonin:4,lacticidven:4) ) Recent Results (from the past 240 hour(s))  SARS Coronavirus 2 by RT PCR (hospital order, performed in [facility] hospital lab) Nasopharyngeal Nasopharyngeal Swab     Status: None   Collection Time: 11/12/19  5:31 PM   Specimen: Nasopharyngeal Swab  Result Value Ref Range Status   SARS Coronavirus 2 NEGATIVE NEGATIVE Final    Comment: (NOTE) SARS-CoV-2 target nucleic acids are NOT DETECTED. The SARS-CoV-2 RNA is generally detectable in upper and lower respiratory specimens during the acute phase of infection. The lowest concentration of SARS-CoV-2 viral copies this assay can detect is 250 copies / mL. A negative result does not preclude SARS-CoV-2 infection and should not be used as the sole basis for treatment or other patient management decisions.  A negative result may occur with improper specimen collection / handling, submission of specimen other than nasopharyngeal swab, presence of viral mutation(s) within the areas targeted by this assay, and inadequate number of viral copies (<250  copies / mL). A negative result must be combined with clinical observations, patient history, and  epidemiological information. Fact Sheet for Patients:   StrictlyIdeas.no Fact Sheet for Healthcare Providers: BankingDealers.co.za This test is not yet approved or cleared  by the [place] FDA and has been authorized for detection and/or diagnosis of SARS-CoV-2 by FDA under an Emergency Use Authorization (EUA).  This EUA will remain in effect (meaning this test can be used) for the duration of the COVID-19 declaration under Section 564(b)(1) of the Act, 21 U.S.C. section 360bbb-3(b)(1), unless the authorization is terminated or revoked sooner. Performed at [facility], [place] [address] [place], [place] [address]      Radiological Exams on Admission: CT Angio Chest PE W and/or Wo Contrast  Result Date: 11/12/2019 CLINICAL DATA:  Progressively worsening shortness of breath over the past 3 days. EXAM: CT ANGIOGRAPHY CHEST WITH CONTRAST TECHNIQUE: Multidetector CT imaging of the chest was performed using the standard protocol during bolus administration of intravenous contrast. Multiplanar CT image reconstructions and MIPs were obtained to evaluate the vascular anatomy. CONTRAST:  82mL OMNIPAQUE IOHEXOL 350 MG/ML SOLN COMPARISON:  Chest x-ray from same day. CT chest dated April 28, 2011. FINDINGS: Cardiovascular: Satisfactory opacification of the pulmonary arteries to the segmental level. No evidence of pulmonary embolism. Unchanged cardiomegaly. No pericardial effusion. Prior CABG. New mild aneurysmal dilatation of the ascending thoracic aorta, measuring up to 4.0 cm. Coronary, aortic arch, and branch vessel atherosclerotic vascular disease. Mediastinum/Nodes: Mildly enlarged mediastinal and bilateral hilar lymph nodes measuring up to 1.3 cm in short axis, likely reactive. Unchanged calcified subcarinal lymph node. No enlarged axillary lymph  nodes. The thyroid gland, trachea, and esophagus demonstrate no significant findings. Lungs/Pleura: Small right greater than left pleural effusions. Mild interlobular septal thickening at the lung bases. Severe central peribronchial thickening. Linear scarring/atelectasis in the posterior right upper lobe. Dependent subsegmental atelectasis in the posterior right middle lobe and both lower lobes. Calcified granuloma in the lingula. No pneumothorax. Upper Abdomen: No acute abnormality. Reflux of contrast into the IVC and hepatic veins. Musculoskeletal: No chest wall abnormality. No acute or significant osseous findings. Review of the MIP images confirms the above findings. IMPRESSION: 1. No evidence of pulmonary embolism. 2. Findings consistent with congestive heart failure with small right greater than left pleural effusions and mild interstitial pulmonary edema. Evidence of right heart dysfunction. 3. Severe central peribronchial thickening, consistent with infectious or inflammatory bronchitis. 4. New ascending thoracic aortic aneurysm measuring up to 4.0 cm. Consider annual imaging followup by CTA or MRA as clinically indicated given the patient's age. This recommendation follows 2010 ACCF/AHA/AATS/ACR/ASA/SCA/SCAI/SIR/STS/SVM Guidelines for the Diagnosis and Management of Patients with Thoracic Aortic Disease. Circulation. 2010; 121: R678-L381. Aortic aneurysm NOS (ICD10-I71.9) 5. Aortic Atherosclerosis (ICD10-I70.0). Electronically Signed   By: Titus Dubin M.D.   On: 11/12/2019 20:47   DG Chest Portable 1 View  Result Date: 11/12/2019 CLINICAL DATA:  Shortness of breath EXAM: PORTABLE CHEST 1 VIEW COMPARISON:  June 26, 2019 FINDINGS: The lungs are clear. Heart is mildly enlarged with pulmonary vascularity normal. Postoperative changes from ovary artery bypass grafting noted. Adenopathy. No bone lesions. IMPRESSION: Stable cardiac prominence.  Postoperative change.  Lungs clear. Electronically Signed    By: Lowella Grip III M.D.   On: 11/12/2019 17:03    EKG: Independently reviewed.  Complete heart block.  Discussed with cardiology.  Assessment/Plan Principal Problem:   Acute CHF (congestive heart failure) (HCC) Active Problems:   Uncontrolled type 2 diabetes with renal manifestation (HCC)   Essential hypertension   Chronic renal insufficiency, stage III (moderate)  CHF (congestive heart failure) (HCC)   Complete heart block (HCC)   Acute encephalopathy    1. Acute on chronic systolic heart failure last EF measured was in 20 1845 to 50%.  Patient placed on Lasix 40 mg IV every 12.  Closely follow intake output metabolic panel.  Patient is on ARB.   2. Complete heart block with AV dissociation discussed with on-call cardiologist Dr. Olena Heckle.  Per cardiology since patient's heart rate is around 60-70 no acute intervention at this time but advised not to keep on any rate limiting medications or AV nodal blockage.  They will be seeing patient in consult.  To repeat EKG.  Will check TSH. 3. Acute encephalopathy cause not clear could be from possible developing dementia patient appears nonfocal.  CT head unremarkable.  But does show volume loss.  Check B12 pneumonia TSH levels.  I am holding off patient's gabapentin for now may resume tomorrow morning if confusion is improved. 4. Pancytopenia appears to be new follow CBC check anemia panel. 5. Diabetes mellitus type 2 on Lantus insulin. 6. Hypertension uncontrolled on amlodipine ARB and Isordil.  We will keep patient on as needed IV hydralazine. 7. Chronic kidney disease stage III creatinine appears to be at baseline.  Note that patient is on ARB. 8. History of CAD status post CABG denies any chest pain.  Given the CHF presentation with abnormal rhythm patient will need close monitoring for any further deterioration in inpatient status.  Note that patient's gabapentin was held may restart tomorrow morning if patient is not having any  worsening confusion may restart with low-dose.   DVT prophylaxis: Lovenox. Code Status: DNR. Family Communication: Patient's daughter-in-law and son.  Patient's son is a Engineer, drilling. Disposition Plan: Back to facility when stable. Consults called: Cardiology. Admission status: Inpatient.   Rise Patience MD [facility] Pager [phone].  If 7PM-7AM, please contact night-coverage [email or web address] Password [facility]  11/13/2019, 12:33 AM

## 2019-11-13 NOTE — Telephone Encounter (Signed)
Patient's daughter states she is returning a call to Dr. Harrington Challenger.

## 2019-11-13 NOTE — Progress Notes (Addendum)
PROGRESS NOTE    Leonard [name]  ZDG:[id number] DOB: 1920-09-12 DOA: 11/12/2019 PCP: Leonard Koch, MD   Brief Narrative: Patient 86 year old PMH CAD, ischemic cardiomyopathy EF 45--50 % who presents with SOB and confusion found to have Heart failure exacerbation and hypoxemia.    Assessment & Plan:   Principal Problem:   Acute CHF (congestive heart failure) (HCC) Active Problems:   Uncontrolled type 2 diabetes with renal manifestation (HCC)   Essential hypertension   Chronic renal insufficiency, stage III (moderate)   CHF (congestive heart failure) (HCC)   Complete heart block (HCC)   Acute encephalopathy  1-Acute on chronic systolic heart failure exacerbation;  CT with pulmonary edema, Elevated BNP>  Continue with IV lasix 40 mg IV BID>  Cardiology consulted. I called cardiology, they will see patient in consultation and will call family.   Add potasium supplementation and mg .   2-Complete Heart Block Cardiology has been consulted.  Avoid AV block agents.  Will hold Norvasc.  Asymptomatic.   3-Pancytopenia; WBC normalized. Platelet mildly low.  Suspect related to B 12 deficiency./ iron deficiency Started B 12 supplement. Iron   Diabetes type 2; Continue with SSI.  Continue with lantus.   Acute Metabolic encephalopathy;  Suspect multifactorial., related to acute illness, Hypoxemia, B 12 deficiency.  Support care.  Start B-12 supplement.  Hold gabapentin for now.   HTN;  Hold Norvasc.  Continue with cozaar, lasix.  Start Hydralazine.   CKD stage III; stable.  CAD, S/P CABG. Continue with aspirin.     Estimated body mass index is 20.95 kg/m as calculated from the following:   Height as of this encounter: 5\' 10"  (1.778 m).   Weight as of this encounter: 66.2 kg.   DVT prophylaxis: Lovenox.  Code Status: DNR Family Communication: daughter  Disposition Plan:  Status is: Inpatient  Remains inpatient appropriate because:Hemodynamically  unstable   Dispo: The patient is from: SNF              Anticipated d/c is to: SNF              Anticipated d/c date is: 3 days              Patient currently is not medically stable to d/c.        Consultants:   Cardiology   Procedures:   ECHO ordered.   Antimicrobials:    Subjective: Patient is still confuse.  He is breathing better.  I spoke with son over phone. He relates Leonard [name] is very active he walk two meters a day, he uses stationary bike. He follow a good diet, he was world war 2. He is very strict with his diet.   Objective: Vitals:   11/12/19 2236 11/13/19 0215 11/13/19 0330 11/13/19 0827  BP:  (!) 172/85 (!) 180/99 (!) 196/106  Pulse: 70 62 74   Resp: 19 17 (!) 21   Temp:      TempSrc:      SpO2: 96% 92% 97%   Weight:      Height:        Intake/Output Summary (Last 24 hours) at 11/13/2019 0917 Last data filed at 11/13/2019 0320 Gross per 24 hour  Intake 0 ml  Output 1050 ml  Net -1050 ml   Filed Weights   11/12/19 1558  Weight: 66.2 kg    Examination:  General exam: NAD Respiratory system: Bilateral crackles.  Cardiovascular system: S1 & S2 heard, RRR. Positive JVD  Gastrointestinal system: Abdomen is nondistended, soft and nontender. No organomegaly or masses felt. Normal bowel sounds heard. Central nervous system: Alert and oriented. Follows command Extremities: Symmetric 5 x 5 power. Skin: No rashes, lesions or ulcers   Data Reviewed: I have personally reviewed following labs and imaging studies  CBC: Recent Labs  Lab 11/12/19 1622  WBC 3.7*  NEUTROABS 1.3*  HGB 12.8*  HCT 39.1  MCV 87.7  PLT 220*   Basic Metabolic Panel: Recent Labs  Lab 11/12/19 1622  NA 135  K 4.7  CL 106  CO2 19*  GLUCOSE 155*  BUN 18  CREATININE 1.33*  CALCIUM 8.2*   GFR: Estimated Creatinine Clearance: 29.7 mL/min (A) (by C-G formula based on SCr of 1.33 mg/dL (H)). Liver Function Tests: Recent Labs  Lab 11/12/19 1622  AST 25  ALT 15   ALKPHOS 102  BILITOT 1.2  PROT 5.9*  ALBUMIN 3.3*   No results for input(s): LIPASE, AMYLASE in the last 168 hours. Recent Labs  Lab 11/13/19 0804  AMMONIA 18   Coagulation Profile: No results for input(s): INR, PROTIME in the last 168 hours. Cardiac Enzymes: No results for input(s): CKTOTAL, CKMB, CKMBINDEX, TROPONINI in the last 168 hours. BNP (last 3 results) No results for input(s): PROBNP in the last 8760 hours. HbA1C: No results for input(s): HGBA1C in the last 72 hours. CBG: Recent Labs  Lab 11/12/19 1838 11/13/19 0809  GLUCAP 111* 133*   Lipid Profile: No results for input(s): CHOL, HDL, LDLCALC, TRIG, CHOLHDL, LDLDIRECT in the last 72 hours. Thyroid Function Tests: No results for input(s): TSH, T4TOTAL, FREET4, T3FREE, THYROIDAB in the last 72 hours. Anemia Panel: No results for input(s): VITAMINB12, FOLATE, FERRITIN, TIBC, IRON, RETICCTPCT in the last 72 hours. Sepsis Labs: No results for input(s): PROCALCITON, LATICACIDVEN in the last 168 hours.  Recent Results (from the past 240 hour(s))  SARS Coronavirus 2 by RT PCR (hospital order, performed in [facility] hospital lab) Nasopharyngeal Nasopharyngeal Swab     Status: None   Collection Time: 11/12/19  5:31 PM   Specimen: Nasopharyngeal Swab  Result Value Ref Range Status   SARS Coronavirus 2 NEGATIVE NEGATIVE Final    Comment: (NOTE) SARS-CoV-2 target nucleic acids are NOT DETECTED. The SARS-CoV-2 RNA is generally detectable in upper and lower respiratory specimens during the acute phase of infection. The lowest concentration of SARS-CoV-2 viral copies this assay can detect is 250 copies / mL. A negative result does not preclude SARS-CoV-2 infection and should not be used as the sole basis for treatment or other patient management decisions.  A negative result may occur with improper specimen collection / handling, submission of specimen other than nasopharyngeal swab, presence of viral mutation(s)  within the areas targeted by this assay, and inadequate number of viral copies (<250 copies / mL). A negative result must be combined with clinical observations, patient history, and epidemiological information. Fact Sheet for Patients:   StrictlyIdeas.no Fact Sheet for Healthcare Providers: BankingDealers.co.za This test is not yet approved or cleared  by the [place] FDA and has been authorized for detection and/or diagnosis of SARS-CoV-2 by FDA under an Emergency Use Authorization (EUA).  This EUA will remain in effect (meaning this test can be used) for the duration of the COVID-19 declaration under Section 564(b)(1) of the Act, 21 U.S.C. section 360bbb-3(b)(1), unless the authorization is terminated or revoked sooner. Performed at [facility], [place] [address] [place], [place] [address]  Radiology Studies: CT HEAD WO CONTRAST  Result Date: 11/13/2019 CLINICAL DATA:  Encephalopathy EXAM: CT HEAD WITHOUT CONTRAST TECHNIQUE: Contiguous axial images were obtained from the base of the skull through the vertex without intravenous contrast. COMPARISON:  CT 05/22/2017 FINDINGS: Brain: No evidence of acute infarction, hemorrhage, hydrocephalus, extra-axial collection or mass lesion/mass effect. Symmetric prominence of the ventricles, cisterns and sulci compatible with moderate and slightly frontal predominant parenchymal volume loss. Patchy areas of white matter hypoattenuation are most compatible with moderate chronic microvascular angiopathy. Partially empty appearance of the sella similar to prior. Vascular: Atherosclerotic calcification of the carotid siphons and intradural vertebral arteries. No hyperdense vessel. Skull: No significant swelling or hematoma. No calvarial fracture or suspicious osseous lesion. Sinuses/Orbits: Postsurgical changes from prior bilateral maxillary antrostomy. Subtotal opacification of the left  maxillary sinus with hyperostotic changes of the walls suggesting chronicity. No abnormal stranding or destructive changes along the sinus perimeter. Chronic leftward nasal septal deviation. Orbital structures are unremarkable aside from prior lens extractions. Other: Bilateral TMJ arthrosis, left greater than right IMPRESSION: 1. No acute intracranial findings. 2. Moderate and slightly frontal predominant parenchymal volume loss and chronic microvascular angiopathy. 3. Postsurgical changes from prior bilateral maxillary antrostomy. Subtotal opacification of the left maxillary sinus with hyperostotic changes of the walls suggesting chronicity. No abnormal stranding or destructive changes along the sinus perimeter. 4. Bilateral TMJ arthrosis, left greater than right. Electronically Signed   By: Lovena Le M.D.   On: 11/13/2019 01:00   CT Angio Chest PE W and/or Wo Contrast  Result Date: 11/12/2019 CLINICAL DATA:  Progressively worsening shortness of breath over the past 3 days. EXAM: CT ANGIOGRAPHY CHEST WITH CONTRAST TECHNIQUE: Multidetector CT imaging of the chest was performed using the standard protocol during bolus administration of intravenous contrast. Multiplanar CT image reconstructions and MIPs were obtained to evaluate the vascular anatomy. CONTRAST:  47mL OMNIPAQUE IOHEXOL 350 MG/ML SOLN COMPARISON:  Chest x-ray from same day. CT chest dated April 28, 2011. FINDINGS: Cardiovascular: Satisfactory opacification of the pulmonary arteries to the segmental level. No evidence of pulmonary embolism. Unchanged cardiomegaly. No pericardial effusion. Prior CABG. New mild aneurysmal dilatation of the ascending thoracic aorta, measuring up to 4.0 cm. Coronary, aortic arch, and branch vessel atherosclerotic vascular disease. Mediastinum/Nodes: Mildly enlarged mediastinal and bilateral hilar lymph nodes measuring up to 1.3 cm in short axis, likely reactive. Unchanged calcified subcarinal lymph node. No enlarged  axillary lymph nodes. The thyroid gland, trachea, and esophagus demonstrate no significant findings. Lungs/Pleura: Small right greater than left pleural effusions. Mild interlobular septal thickening at the lung bases. Severe central peribronchial thickening. Linear scarring/atelectasis in the posterior right upper lobe. Dependent subsegmental atelectasis in the posterior right middle lobe and both lower lobes. Calcified granuloma in the lingula. No pneumothorax. Upper Abdomen: No acute abnormality. Reflux of contrast into the IVC and hepatic veins. Musculoskeletal: No chest wall abnormality. No acute or significant osseous findings. Review of the MIP images confirms the above findings. IMPRESSION: 1. No evidence of pulmonary embolism. 2. Findings consistent with congestive heart failure with small right greater than left pleural effusions and mild interstitial pulmonary edema. Evidence of right heart dysfunction. 3. Severe central peribronchial thickening, consistent with infectious or inflammatory bronchitis. 4. New ascending thoracic aortic aneurysm measuring up to 4.0 cm. Consider annual imaging followup by CTA or MRA as clinically indicated given the patient's age. This recommendation follows 2010 ACCF/AHA/AATS/ACR/ASA/SCA/SCAI/SIR/STS/SVM Guidelines for the Diagnosis and Management of Patients with Thoracic Aortic Disease. Circulation. 2010; 121: J825-K539. Aortic  aneurysm NOS (ICD10-I71.9) 5. Aortic Atherosclerosis (ICD10-I70.0). Electronically Signed   By: Titus Dubin M.D.   On: 11/12/2019 20:47   DG Chest Portable 1 View  Result Date: 11/12/2019 CLINICAL DATA:  Shortness of breath EXAM: PORTABLE CHEST 1 VIEW COMPARISON:  June 26, 2019 FINDINGS: The lungs are clear. Heart is mildly enlarged with pulmonary vascularity normal. Postoperative changes from ovary artery bypass grafting noted. Adenopathy. No bone lesions. IMPRESSION: Stable cardiac prominence.  Postoperative change.  Lungs clear.  Electronically Signed   By: Lowella Grip III M.D.   On: 11/12/2019 17:03        Scheduled Meds: . aspirin  81 mg Oral Daily  . enoxaparin (LOVENOX) injection  30 mg Subcutaneous QHS  . furosemide  40 mg Intravenous Q12H  . hydrALAZINE  25 mg Oral Q8H  . insulin aspart  0-9 Units Subcutaneous TID WC  . insulin glargine  9 Units Subcutaneous Daily  . isosorbide dinitrate  10 mg Oral TID  . latanoprost  1 drop Both Eyes QHS  . losartan  100 mg Oral Daily  . mirabegron ER  25 mg Oral Daily  . tamsulosin  0.4 mg Oral Daily   Continuous Infusions:   LOS: 1 day    Time spent: 35 minutes.     Elmarie Shiley, MD [facility]   If 7PM-7AM, please contact night-coverage [email or web address]  11/13/2019, 9:17 AM

## 2019-11-13 NOTE — ED Notes (Signed)
Lunch Tray Ordered @ 1058. 

## 2019-11-13 NOTE — ED Notes (Signed)
Report given to [facility] RN

## 2019-11-13 NOTE — ED Notes (Signed)
Patient up walking around room. Patient urinated in trash can and floor. Environmental services in room for cleaning.

## 2019-11-13 NOTE — ED Notes (Signed)
Tele   Breakfast ordered  

## 2019-11-14 DIAGNOSIS — I5023 Acute on chronic systolic (congestive) heart failure: Secondary | ICD-10-CM

## 2019-11-14 DIAGNOSIS — I10 Essential (primary) hypertension: Secondary | ICD-10-CM

## 2019-11-14 DIAGNOSIS — E1165 Type 2 diabetes mellitus with hyperglycemia: Secondary | ICD-10-CM

## 2019-11-14 DIAGNOSIS — E1129 Type 2 diabetes mellitus with other diabetic kidney complication: Secondary | ICD-10-CM

## 2019-11-14 LAB — BASIC METABOLIC PANEL
Anion gap: 12 (ref 5–15)
BUN: 17 mg/dL (ref 8–23)
CO2: 23 mmol/L (ref 22–32)
Calcium: 9 mg/dL (ref 8.9–10.3)
Chloride: 103 mmol/L (ref 98–111)
Creatinine, Ser: 1.42 mg/dL — ABNORMAL HIGH (ref 0.61–1.24)
GFR calc Af Amer: 48 mL/min — ABNORMAL LOW (ref >60)
GFR calc non Af Amer: 41 mL/min — ABNORMAL LOW (ref >60)
Glucose, Bld: 160 mg/dL — ABNORMAL HIGH (ref 70–99)
Potassium: 4.5 mmol/L (ref 3.5–5.1)
Sodium: 138 mmol/L (ref 135–145)

## 2019-11-14 LAB — GLUCOSE, CAPILLARY
Glucose-Capillary: 167 mg/dL — ABNORMAL HIGH (ref 70–99)
Glucose-Capillary: 237 mg/dL — ABNORMAL HIGH (ref 70–99)
Glucose-Capillary: 285 mg/dL — ABNORMAL HIGH (ref 70–99)

## 2019-11-14 LAB — BRAIN NATRIURETIC PEPTIDE: B Natriuretic Peptide: 311.7 pg/mL — ABNORMAL HIGH (ref 0.0–100.0)

## 2019-11-14 MED ORDER — FUROSEMIDE 40 MG PO TABS
40.0000 mg | ORAL_TABLET | Freq: Every day | ORAL | Status: DC
  Administered 2019-11-15 – 2019-11-20 (×6): 40 mg via ORAL
  Filled 2019-11-14 (×6): qty 1

## 2019-11-14 MED ORDER — QUETIAPINE FUMARATE 25 MG PO TABS
25.0000 mg | ORAL_TABLET | Freq: Every day | ORAL | Status: DC
  Administered 2019-11-14: 25 mg via ORAL
  Filled 2019-11-14: qty 1

## 2019-11-14 MED ORDER — ISOSORBIDE MONONITRATE ER 30 MG PO TB24
30.0000 mg | ORAL_TABLET | Freq: Every day | ORAL | Status: DC
  Administered 2019-11-14 – 2019-11-20 (×7): 30 mg via ORAL
  Filled 2019-11-14 (×7): qty 1

## 2019-11-14 MED ORDER — FUROSEMIDE 10 MG/ML IJ SOLN
80.0000 mg | Freq: Once | INTRAMUSCULAR | Status: AC
  Administered 2019-11-14: 80 mg via INTRAVENOUS
  Filled 2019-11-14: qty 8

## 2019-11-14 MED ORDER — HALOPERIDOL LACTATE 5 MG/ML IJ SOLN
5.0000 mg | Freq: Once | INTRAMUSCULAR | Status: AC
  Administered 2019-11-14: 5 mg via INTRAVENOUS
  Filled 2019-11-14: qty 1

## 2019-11-14 NOTE — Telephone Encounter (Signed)
The patient is currently admitted. He is a patient of Dr. Schuyler Amor. [name]. Dr. Harrington Challenger saw him at hospital yesterday.

## 2019-11-14 NOTE — Progress Notes (Signed)
Patient is very agitated this morning. Expressing that he wants [place] out of his room and he 'pays to have his own space'. Attempted to reorient patient to place and time but patient remains uncooperative. Patient wants to go to the bathroom. Attempted to ambulate with help to the bathroom but patient is very unsteady. Patient helped back to the bed and given safety instrucitons

## 2019-11-14 NOTE — Plan of Care (Signed)
  Problem: Education: Goal: Knowledge of General Education information will improve Description: Including pain rating scale, medication(s)/side effects and non-pharmacologic comfort measures Outcome: Progressing   Problem: Health Behavior/Discharge Planning: Goal: Ability to manage health-related needs will improve Outcome: Progressing   Problem: Clinical Measurements: Goal: Will remain free from infection Outcome: Progressing   

## 2019-11-14 NOTE — Progress Notes (Signed)
Patient very agitated and confused, pt is Leonard [name] and not cooperative at all. Paged provider for PRN to help patient relax and get sleep. Order received, medication given.

## 2019-11-14 NOTE — Progress Notes (Addendum)
Nutrition Follow-up  DOCUMENTATION CODES:   Non-severe (moderate) malnutrition in context of chronic illness  INTERVENTION:   -Continue Ensure Enlive po daily, each supplement provides 350 kcal and 20 grams of protein -Continue Magic cup TID with meals, each supplement provides 290 kcal and 9 grams of protein -Continue MVI with minerals daily  NUTRITION DIAGNOSIS:   Moderate Malnutrition related to chronic illness(ischemic cardiomyopathy) as evidenced by mild fat depletion, moderate fat depletion, mild muscle depletion, moderate muscle depletion.  Ongoing  GOAL:   Patient will meet greater than or equal to 90% of their needs  Progressing   MONITOR:   PO intake, Supplement acceptance, Labs, Weight trends, Skin  REASON FOR ASSESSMENT:   Malnutrition Screening Tool    ASSESSMENT:   Leonard [name] is a 86 y.o. male with history of CAD status post CABG, ischemic cardiomyopathy last EF measured in 2018 was 45 to 50%, hypertension diabetes mellitus type 2, chronic kidney disease who was admitted in January of this year for Covid 19 infection was noticed to have increasing shortness of breath over the last 3 days by patient's family.  Patient did not have any chest pain or fever or chills but has been having some cough.  Patient was also noticed to be increasingly confused yesterday.  Reviewed I/O's: -705 ml x 24 hours and -1.8 L since admission  UOP: 1.4 L x 24 hours  Spoke with nurse tech, who reports that pt was agitated this AM, but has been more calm over the past few hours. Observed pt work with mobility tech in the hallway; mobility tech confirmed pt did well and uses a walker at baseline. Per nurse tech, pt consumed a fruit cup at breakfast. He ate nothing else, as he did not like the coffee, pancakes, or cream of wheat that was delivered.   Pt sitting in recliner chair, reading the newspaper at time of visit. Pt pleasant and laughing and joking with this RD throughout the  visit. Pt reports he has a good appetite, with no chewing or swallowing issues. When asked about foods he typically eats, he replied "if I like it, I'll eat it. If I don't I won't and makes sure someone knows". Nurse tech has been assisting pt with his lunch orders and is happy with the items he ordered for lunch. Meal completion variable; documented at 5-100%.   Pt is unsure if he has lost weight and is unsure of his UBW. Per chart review, pt has experienced 5.1% wt loss over the past 6 months, which is not significant for time frame.   Discussed importance of good meal and supplement intake to promote healing.   Medications reviewed and include lasix.   Labs reviewed: CBGS: 167-286 (inpatient orders for glycemic control are 0-9 units insulin aspart TID with meals and 9 units insulin glargine daily).   NUTRITION - FOCUSED PHYSICAL EXAM:    Most Recent Value  Orbital Region  Mild depletion  Upper Arm Region  Moderate depletion  Thoracic and Lumbar Region  No depletion  Buccal Region  Mild depletion  Temple Region  Moderate depletion  Clavicle Bone Region  Moderate depletion  Clavicle and Acromion Bone Region  Moderate depletion  Scapular Bone Region  Moderate depletion  Dorsal Hand  Moderate depletion  Patellar Region  Mild depletion  Anterior Thigh Region  Mild depletion  Posterior Calf Region  Mild depletion  Edema (RD Assessment)  Mild  Hair  Reviewed  Eyes  Reviewed  Mouth  Reviewed  Skin  Reviewed  Nails  Reviewed       Diet Order:   Diet Order            Diet heart healthy/carb modified Room service appropriate? Yes; Fluid consistency: Thin; Fluid restriction: 1200 mL Fluid  Diet effective now              EDUCATION NEEDS:   Education needs have been addressed  Skin:  Skin Assessment: Reviewed RN Assessment  Last BM:  11/14/19  Height:   Ht Readings from Last 1 Encounters:  11/13/19 5\' 4"  (1.626 m)    Weight:   Wt Readings from Last 1 Encounters:   11/14/19 59.4 kg    Ideal Body Weight:  54.5 kg  BMI:  Body mass index is 22.47 kg/m.  Estimated Nutritional Needs:   Kcal:  1600-1800  Protein:  75-90 grams  Fluid:  1.2 L    Loistine Chance, RD, LDN, [place] Registered Dietitian II Certified Diabetes Care and Education Specialist Please refer to [facility] for RD and/or RD on-call/weekend/after hours pager

## 2019-11-14 NOTE — Progress Notes (Signed)
ReDS Reading was 38%  Goal   20 to 35%  Will give additional dose of lasix IV tonight  Labs in AM   Dorris Carnes, MD

## 2019-11-14 NOTE — Progress Notes (Signed)
The patient received a ReDS Clip reading.  ReDS Clip Reading Number= 38%

## 2019-11-14 NOTE — Plan of Care (Signed)
°  Problem: Activity: Goal: Risk for activity intolerance will decrease Outcome: Progressing   Problem: Education: Goal: Knowledge of General Education information will improve Description: Including pain rating scale, medication(s)/side effects and non-pharmacologic comfort measures Outcome: Not Progressing   Problem: Coping: Goal: Level of anxiety will decrease Outcome: Not Progressing   Problem: Safety: Goal: Ability to remain free from injury will improve Outcome: Not Progressing

## 2019-11-14 NOTE — Progress Notes (Addendum)
Progress Note  Patient Name: Leonard [name] Date of Encounter: 11/14/2019  [facility] Cardiologist:  Subjective   PT just waking up  Comforrtable laying in bed  Denies SOB  No CP   Inpatient Medications    Scheduled Meds: . amLODipine  5 mg Oral BID  . aspirin  81 mg Oral Daily  . enoxaparin (LOVENOX) injection  30 mg Subcutaneous QHS  . feeding supplement (ENSURE ENLIVE)  237 mL Oral QHS  . furosemide  40 mg Intravenous Q12H  . insulin aspart  0-9 Units Subcutaneous TID WC  . insulin glargine  9 Units Subcutaneous Daily  . isosorbide dinitrate  10 mg Oral TID  . latanoprost  1 drop Both Eyes QHS  . losartan  100 mg Oral Daily  . mirabegron ER  25 mg Oral Daily  . multivitamin with minerals  1 tablet Oral Daily  . tamsulosin  0.4 mg Oral Daily  . vitamin B-12  1,000 mcg Oral Daily   Continuous Infusions:  PRN Meds: hydrALAZINE   Vital Signs    Vitals:   11/13/19 1430 11/13/19 1636 11/13/19 2027 11/14/19 0549  BP: (!) 158/97 123/68 (!) 148/83 137/74  Pulse: 71 62 80 71  Resp: 20 18 18 18   Temp: 98.5 F (36.9 C) (!) 97.3 F (36.3 C) 97.9 F (36.6 C) 97.9 F (36.6 C)  TempSrc: Oral Oral Oral   SpO2: 100% 98% 99% 99%  Weight: 63 kg     Height: 5\' 4"  (1.626 m)       Intake/Output Summary (Last 24 hours) at 11/14/2019 0715 Last data filed at 11/13/2019 2200 Gross per 24 hour  Intake 645.31 ml  Output 1350 ml  Net -704.69 ml   Net neg 1.75 L     Last 3 Weights 11/13/2019 11/12/2019 06/26/2019  Weight (lbs) 139 lb 146 lb 138 lb  Weight (kg) 63.05 kg 66.225 kg 62.596 kg      Telemetry    SR  60 - Personally Reviewed  ECG    -None Personally Reviewed  Physical Exam   GEN: No acute distress.   Neck: No JVD Cardiac: RRR, no murmurs Respiratory: Clear to auscultation bilaterally  Upper airway wheeze. GI: Soft, nontender, non-distended  MS: No edema; No deformity. Neuro:  Nonfocal  Psych: Normal affect   Labs    High Sensitivity Troponin:     Recent Labs  Lab 11/12/19 1622 11/12/19 1816 11/13/19 0804 11/13/19 1218  TROPONINIHS 38* 49* 47* 49*      Chemistry Recent Labs  Lab 11/12/19 1622 11/13/19 0804 11/14/19 0536  NA 135 139 138  K 4.7 3.7 4.5  CL 106 101 103  CO2 19* 23 23  GLUCOSE 155* 139* 160*  BUN 18 15 17   CREATININE 1.33* 1.31*  1.31* 1.42*  CALCIUM 8.2* 8.9 9.0  PROT 5.9* 6.8  --   ALBUMIN 3.3* 3.5  --   AST 25 17  --   ALT 15 20  --   ALKPHOS 102 115  --   BILITOT 1.2 0.7  --   GFRNONAA 44* 45*  45* 41*  GFRAA 52* 53*  53* 48*  ANIONGAP 10 15 12      Hematology Recent Labs  Lab 11/12/19 1622 11/13/19 0804  WBC 3.7* 4.2  RBC 4.46 4.89  4.87  HGB 12.8* 13.9  HCT 39.1 42.7  MCV 87.7 87.3  MCH 28.7 28.4  MCHC 32.7 32.6  RDW 14.9 15.0  PLT 101* 149*  BNP Recent Labs  Lab 11/12/19 1630  BNP 447.9*     DDimer  Recent Labs  Lab 11/12/19 1745  DDIMER 1.67*     Radiology    CT HEAD WO CONTRAST  Result Date: 11/13/2019 CLINICAL DATA:  Encephalopathy EXAM: CT HEAD WITHOUT CONTRAST TECHNIQUE: Contiguous axial images were obtained from the base of the skull through the vertex without intravenous contrast. COMPARISON:  CT 05/22/2017 FINDINGS: Brain: No evidence of acute infarction, hemorrhage, hydrocephalus, extra-axial collection or mass lesion/mass effect. Symmetric prominence of the ventricles, cisterns and sulci compatible with moderate and slightly frontal predominant parenchymal volume loss. Patchy areas of white matter hypoattenuation are most compatible with moderate chronic microvascular angiopathy. Partially empty appearance of the sella similar to prior. Vascular: Atherosclerotic calcification of the carotid siphons and intradural vertebral arteries. No hyperdense vessel. Skull: No significant swelling or hematoma. No calvarial fracture or suspicious osseous lesion. Sinuses/Orbits: Postsurgical changes from prior bilateral maxillary antrostomy. Subtotal opacification of the  left maxillary sinus with hyperostotic changes of the walls suggesting chronicity. No abnormal stranding or destructive changes along the sinus perimeter. Chronic leftward nasal septal deviation. Orbital structures are unremarkable aside from prior lens extractions. Other: Bilateral TMJ arthrosis, left greater than right IMPRESSION: 1. No acute intracranial findings. 2. Moderate and slightly frontal predominant parenchymal volume loss and chronic microvascular angiopathy. 3. Postsurgical changes from prior bilateral maxillary antrostomy. Subtotal opacification of the left maxillary sinus with hyperostotic changes of the walls suggesting chronicity. No abnormal stranding or destructive changes along the sinus perimeter. 4. Bilateral TMJ arthrosis, left greater than right. Electronically Signed   By: Lovena Le M.D.   On: 11/13/2019 01:00   CT Angio Chest PE W and/or Wo Contrast  Result Date: 11/12/2019 CLINICAL DATA:  Progressively worsening shortness of breath over the past 3 days. EXAM: CT ANGIOGRAPHY CHEST WITH CONTRAST TECHNIQUE: Multidetector CT imaging of the chest was performed using the standard protocol during bolus administration of intravenous contrast. Multiplanar CT image reconstructions and MIPs were obtained to evaluate the vascular anatomy. CONTRAST:  42mL OMNIPAQUE IOHEXOL 350 MG/ML SOLN COMPARISON:  Chest x-ray from same day. CT chest dated April 28, 2011. FINDINGS: Cardiovascular: Satisfactory opacification of the pulmonary arteries to the segmental level. No evidence of pulmonary embolism. Unchanged cardiomegaly. No pericardial effusion. Prior CABG. New mild aneurysmal dilatation of the ascending thoracic aorta, measuring up to 4.0 cm. Coronary, aortic arch, and branch vessel atherosclerotic vascular disease. Mediastinum/Nodes: Mildly enlarged mediastinal and bilateral hilar lymph nodes measuring up to 1.3 cm in short axis, likely reactive. Unchanged calcified subcarinal lymph node. No  enlarged axillary lymph nodes. The thyroid gland, trachea, and esophagus demonstrate no significant findings. Lungs/Pleura: Small right greater than left pleural effusions. Mild interlobular septal thickening at the lung bases. Severe central peribronchial thickening. Linear scarring/atelectasis in the posterior right upper lobe. Dependent subsegmental atelectasis in the posterior right middle lobe and both lower lobes. Calcified granuloma in the lingula. No pneumothorax. Upper Abdomen: No acute abnormality. Reflux of contrast into the IVC and hepatic veins. Musculoskeletal: No chest wall abnormality. No acute or significant osseous findings. Review of the MIP images confirms the above findings. IMPRESSION: 1. No evidence of pulmonary embolism. 2. Findings consistent with congestive heart failure with small right greater than left pleural effusions and mild interstitial pulmonary edema. Evidence of right heart dysfunction. 3. Severe central peribronchial thickening, consistent with infectious or inflammatory bronchitis. 4. New ascending thoracic aortic aneurysm measuring up to 4.0 cm. Consider annual imaging followup by CTA  or MRA as clinically indicated given the patient's age. This recommendation follows 2010 ACCF/AHA/AATS/ACR/ASA/SCA/SCAI/SIR/STS/SVM Guidelines for the Diagnosis and Management of Patients with Thoracic Aortic Disease. Circulation. 2010; 121: E081-K481. Aortic aneurysm NOS (ICD10-I71.9) 5. Aortic Atherosclerosis (ICD10-I70.0). Electronically Signed   By: Titus Dubin M.D.   On: 11/12/2019 20:47   DG Chest Portable 1 View  Result Date: 11/12/2019 CLINICAL DATA:  Shortness of breath EXAM: PORTABLE CHEST 1 VIEW COMPARISON:  June 26, 2019 FINDINGS: The lungs are clear. Heart is mildly enlarged with pulmonary vascularity normal. Postoperative changes from ovary artery bypass grafting noted. Adenopathy. No bone lesions. IMPRESSION: Stable cardiac prominence.  Postoperative change.  Lungs clear.  Electronically Signed   By: Lowella Grip III M.D.   On: 11/12/2019 17:03   ECHOCARDIOGRAM COMPLETE  Result Date: 11/13/2019    ECHOCARDIOGRAM REPORT   Patient Name:   Leonard [name] Date of Exam: 11/13/2019 Medical Rec #:  [id number]       Height:       70.0 in Accession #:    [id number]      Weight:       146.0 lb Date of Birth:  Oct 05, 1920        BSA:          1.826 m Patient Age:    34 years        BP:           149/81 mmHg Patient Gender: M               HR:           78 bpm. Exam Location:  Inpatient Procedure: 2D Echo, Color Doppler and Cardiac Doppler Indications:    I50.9* Heart failure (unspecified)  History:        Patient has prior history of Echocardiogram examinations, most                 recent 03/12/2017. CHF; Risk Factors:Hypertension, Diabetes and                 Dyslipidemia. COVID+ on 06/26/19.  Sonographer:    Raquel Sarna Senior RDCS Referring Phys: [place]  1. Left ventricular ejection fraction, by estimation, is 40 to 45%. The left ventricle has mildly decreased function. There is mild left ventricular hypertrophy. Left ventricular diastolic parameters are indeterminate.  2. Right ventricular systolic function is moderately reduced. The right ventricular size is normal. Tricuspid regurgitation signal is inadequate for assessing PA pressure.  3. Left atrial size was moderately dilated.  4. Right atrial size was mildly dilated.  5. The mitral valve is degenerative. Mild mitral valve regurgitation.  6. The aortic valve was not well visualized. Aortic valve regurgitation is not visualized. Mild aortic valve sclerosis is present, with no evidence of aortic valve stenosis.  7. Aortic dilatation noted. There is dilatation of the ascending aorta measuring 39 mm.  8. The inferior vena cava is dilated in size with >50% respiratory variability, suggesting right atrial pressure of 8 mmHg. FINDINGS  Left Ventricle: Left ventricular ejection fraction, by estimation, is 40 to 45%.  The left ventricle has mildly decreased function. The left ventricle demonstrates global hypokinesis. The left ventricular internal cavity size was normal in size. There is  mild left ventricular hypertrophy. Left ventricular diastolic parameters are indeterminate. Right Ventricle: The right ventricular size is normal. Right vetricular wall thickness was not assessed. Right ventricular systolic function is moderately reduced. Tricuspid regurgitation signal is inadequate for assessing PA pressure.  Left Atrium: Left atrial size was moderately dilated. Right Atrium: Right atrial size was mildly dilated. Pericardium: There is no evidence of pericardial effusion. Mitral Valve: The mitral valve is degenerative in appearance. Moderate mitral annular calcification. Mild mitral valve regurgitation. Tricuspid Valve: The tricuspid valve is normal in structure. Tricuspid valve regurgitation is not demonstrated. Aortic Valve: The aortic valve was not well visualized. Aortic valve regurgitation is not visualized. Mild aortic valve sclerosis is present, with no evidence of aortic valve stenosis. Pulmonic Valve: The pulmonic valve was not well visualized. Pulmonic valve regurgitation is not visualized. Aorta: Aortic dilatation noted. There is dilatation of the ascending aorta measuring 39 mm. Venous: The inferior vena cava is dilated in size with greater than 50% respiratory variability, suggesting right atrial pressure of 8 mmHg. IAS/Shunts: The interatrial septum was not well visualized.  LEFT VENTRICLE PLAX 2D LVIDd:         5.00 cm LVIDs:         4.10 cm LV PW:         1.10 cm LV IVS:        1.20 cm LVOT diam:     1.80 cm LV SV:         39 LV SV Index:   22 LVOT Area:     2.54 cm  LV Volumes (MOD) LV vol d, MOD A2C: 85.6 ml LV vol d, MOD A4C: 164.0 ml LV vol s, MOD A2C: 50.7 ml LV vol s, MOD A4C: 91.4 ml LV SV MOD A2C:     34.9 ml LV SV MOD A4C:     164.0 ml LV SV MOD BP:      52.0 ml RIGHT VENTRICLE RV S prime:     5.77 cm/s  TAPSE (M-mode): 1.0 cm LEFT ATRIUM              Index       RIGHT ATRIUM           Index LA diam:        4.10 cm  2.25 cm/m  RA Area:     20.60 cm LA Vol (A2C):   118.0 ml 64.62 ml/m RA Volume:   58.20 ml  31.87 ml/m LA Vol (A4C):   58.7 ml  32.15 ml/m LA Biplane Vol: 88.6 ml  48.52 ml/m  AORTIC VALVE LVOT Vmax:   101.00 cm/s LVOT Vmean:  65.100 cm/s LVOT VTI:    0.155 m  AORTA Ao Root diam: 3.10 cm Ao Asc diam:  3.90 cm  SHUNTS Systemic VTI:  0.16 m Systemic Diam: 1.80 cm Oswaldo Milian MD Electronically signed by Oswaldo Milian MD Signature Date/Time: 11/13/2019/3:53:36 PM    Final     Cardiac Studies     Patient Profile     86 y.o. male hx of CAD, CHF   Admitted with  SOB  Noted to have conduction dz.  Assessment & Plan   1  Rhythm   Pt remains in SR   No significant bradycardia or pauses   No sx of worsening AV block   Follow  2  Acute on chronic systolic CHF   Volume appears to be improved  He has some upper airway wheezes   Will recheck BNP   I am not convinced CHF   Caregivers say he is not aspirating    Would switch to 40 lasix daily    Will need f/u as outpt  Will get ReDS vest today as well  3  HTN  BP is better than yesterday   Not Isordil added  I would switch to imdur 30   For ease   Avoid hydralazine for now  A very low BP for him would be worse than high BP  4  CAD  No symptoms of angina   Close to discharge    For questions or updates, please contact [facility] Please consult [email or web address] for contact info under        Signed, Dorris Carnes, MD  11/14/2019, 7:15 AM

## 2019-11-14 NOTE — Progress Notes (Addendum)
PROGRESS NOTE    THUNDER BRIDGEWATER  YIR:[id number] DOB: [birth year] DOA: 11/12/2019 PCP: Hoyt Koch, MD    Brief Narrative:  Patient 86 year old PMH CAD, ischemic cardiomyopathy EF 45--50 % who presents with SOB and confusion found to have Heart failure exacerbation and hypoxemia.     Consultants:   Cardiology  Procedures:   Antimicrobials:       Subjective: Patient sitting in chair.  Reports "feeling better.  Per nursing overnight he was confused and he seemed like he was sundowning.  Patient denies shortness of breath or chest pain  Objective: Vitals:   11/14/19 0549 11/14/19 0842 11/14/19 0954 11/14/19 1103  BP: 137/74 (!) 155/82  134/79  Pulse: 71 67  64  Resp: 18 16  (!) 21  Temp: 97.9 F (36.6 C)   97.8 F (36.6 C)  TempSrc:    Oral  SpO2: 99% 95%  99%  Weight:   59.4 kg   Height:        Intake/Output Summary (Last 24 hours) at 11/14/2019 1522 Last data filed at 11/14/2019 1307 Gross per 24 hour  Intake 1305.31 ml  Output 750 ml  Net 555.31 ml   Filed Weights   11/12/19 1558 11/13/19 1430 11/14/19 0954  Weight: 66.2 kg 63 kg 59.4 kg    Examination:  General exam: Appears calm and comfortable  Respiratory system: Clear to auscultation. Respiratory effort normal.no rhonchi or rales Upper airway wheezing.  Cardiovascular system: S1 & S2 heard, RRR. No JVD, murmurs, rubs, gallops or clicks.  Gastrointestinal system: Abdomen is nondistended, soft and nontender.Normal bowel sounds heard. Central nervous system: Alert and oriented to person only.  Extremitie: no edema Skin: warm, dry Psychiatry: Mood & affect appropriate in current setting.     Data Reviewed: I have personally reviewed following labs and imaging studies  CBC: Recent Labs  Lab 11/12/19 1622 11/13/19 0804  WBC 3.7* 4.2  NEUTROABS 1.3* 2.0  HGB 12.8* 13.9  HCT 39.1 42.7  MCV 87.7 87.3  PLT 101* 500*   Basic Metabolic Panel: Recent Labs  Lab 11/12/19 1622 11/13/19 0804  11/14/19 0536  NA 135 139 138  K 4.7 3.7 4.5  CL 106 101 103  CO2 19* 23 23  GLUCOSE 155* 139* 160*  BUN 18 15 17   CREATININE 1.33* 1.31*  1.31* 1.42*  CALCIUM 8.2* 8.9 9.0  MG  --  1.8  --    GFR: Estimated Creatinine Clearance: 24.9 mL/min (A) (by C-G formula based on SCr of 1.42 mg/dL (H)). Liver Function Tests: Recent Labs  Lab 11/12/19 1622 11/13/19 0804  AST 25 17  ALT 15 20  ALKPHOS 102 115  BILITOT 1.2 0.7  PROT 5.9* 6.8  ALBUMIN 3.3* 3.5   No results for input(s): LIPASE, AMYLASE in the last 168 hours. Recent Labs  Lab 11/13/19 0804  AMMONIA 18   Coagulation Profile: No results for input(s): INR, PROTIME in the last 168 hours. Cardiac Enzymes: No results for input(s): CKTOTAL, CKMB, CKMBINDEX, TROPONINI in the last 168 hours. BNP (last 3 results) No results for input(s): PROBNP in the last 8760 hours. HbA1C: No results for input(s): HGBA1C in the last 72 hours. CBG: Recent Labs  Lab 11/13/19 1158 11/13/19 1632 11/13/19 2113 11/14/19 0623 11/14/19 1112  GLUCAP 219* 82 270* 167* 237*   Lipid Profile: No results for input(s): CHOL, HDL, LDLCALC, TRIG, CHOLHDL, LDLDIRECT in the last 72 hours. Thyroid Function Tests: Recent Labs    11/13/19 0804  TSH 1.954   Anemia Panel: Recent Labs    11/13/19 0804  VITAMINB12 146*  FOLATE 14.9  FERRITIN 67  TIBC 294  IRON 43*  RETICCTPCT 1.4   Sepsis Labs: No results for input(s): PROCALCITON, LATICACIDVEN in the last 168 hours.  Recent Results (from the past 240 hour(s))  SARS Coronavirus 2 by RT PCR (hospital order, performed in [facility] hospital lab) Nasopharyngeal Nasopharyngeal Swab     Status: None   Collection Time: 11/12/19  5:31 PM   Specimen: Nasopharyngeal Swab  Result Value Ref Range Status   SARS Coronavirus 2 NEGATIVE NEGATIVE Final    Comment: (NOTE) SARS-CoV-2 target nucleic acids are NOT DETECTED. The SARS-CoV-2 RNA is generally detectable in upper and lower respiratory  specimens during the acute phase of infection. The lowest concentration of SARS-CoV-2 viral copies this assay can detect is 250 copies / mL. A negative result does not preclude SARS-CoV-2 infection and should not be used as the sole basis for treatment or other patient management decisions.  A negative result may occur with improper specimen collection / handling, submission of specimen other than nasopharyngeal swab, presence of viral mutation(s) within the areas targeted by this assay, and inadequate number of viral copies (<250 copies / mL). A negative result must be combined with clinical observations, patient history, and epidemiological information. Fact Sheet for Patients:   StrictlyIdeas.no Fact Sheet for Healthcare Providers: BankingDealers.co.za This test is not yet approved or cleared  by the [place] FDA and has been authorized for detection and/or diagnosis of SARS-CoV-2 by FDA under an Emergency Use Authorization (EUA).  This EUA will remain in effect (meaning this test can be used) for the duration of the COVID-19 declaration under Section 564(b)(1) of the Act, 21 U.S.C. section 360bbb-3(b)(1), unless the authorization is terminated or revoked sooner. Performed at [facility], [place] [address] [place], [place] [address]          Radiology Studies: CT HEAD WO CONTRAST  Result Date: 11/13/2019 CLINICAL DATA:  Encephalopathy EXAM: CT HEAD WITHOUT CONTRAST TECHNIQUE: Contiguous axial images were obtained from the base of the skull through the vertex without intravenous contrast. COMPARISON:  CT 05/22/2017 FINDINGS: Brain: No evidence of acute infarction, hemorrhage, hydrocephalus, extra-axial collection or mass lesion/mass effect. Symmetric prominence of the ventricles, cisterns and sulci compatible with moderate and slightly frontal predominant parenchymal volume loss. Patchy areas of white matter hypoattenuation are  most compatible with moderate chronic microvascular angiopathy. Partially empty appearance of the sella similar to prior. Vascular: Atherosclerotic calcification of the carotid siphons and intradural vertebral arteries. No hyperdense vessel. Skull: No significant swelling or hematoma. No calvarial fracture or suspicious osseous lesion. Sinuses/Orbits: Postsurgical changes from prior bilateral maxillary antrostomy. Subtotal opacification of the left maxillary sinus with hyperostotic changes of the walls suggesting chronicity. No abnormal stranding or destructive changes along the sinus perimeter. Chronic leftward nasal septal deviation. Orbital structures are unremarkable aside from prior lens extractions. Other: Bilateral TMJ arthrosis, left greater than right IMPRESSION: 1. No acute intracranial findings. 2. Moderate and slightly frontal predominant parenchymal volume loss and chronic microvascular angiopathy. 3. Postsurgical changes from prior bilateral maxillary antrostomy. Subtotal opacification of the left maxillary sinus with hyperostotic changes of the walls suggesting chronicity. No abnormal stranding or destructive changes along the sinus perimeter. 4. Bilateral TMJ arthrosis, left greater than right. Electronically Signed   By: Lovena Le M.D.   On: 11/13/2019 01:00   CT Angio Chest PE W and/or Wo Contrast  Result Date: 11/12/2019 CLINICAL DATA:  Progressively worsening shortness of breath over the past 3 days. EXAM: CT ANGIOGRAPHY CHEST WITH CONTRAST TECHNIQUE: Multidetector CT imaging of the chest was performed using the standard protocol during bolus administration of intravenous contrast. Multiplanar CT image reconstructions and MIPs were obtained to evaluate the vascular anatomy. CONTRAST:  53mL OMNIPAQUE IOHEXOL 350 MG/ML SOLN COMPARISON:  Chest x-ray from same day. CT chest dated April 28, 2011. FINDINGS: Cardiovascular: Satisfactory opacification of the pulmonary arteries to the segmental  level. No evidence of pulmonary embolism. Unchanged cardiomegaly. No pericardial effusion. Prior CABG. New mild aneurysmal dilatation of the ascending thoracic aorta, measuring up to 4.0 cm. Coronary, aortic arch, and branch vessel atherosclerotic vascular disease. Mediastinum/Nodes: Mildly enlarged mediastinal and bilateral hilar lymph nodes measuring up to 1.3 cm in short axis, likely reactive. Unchanged calcified subcarinal lymph node. No enlarged axillary lymph nodes. The thyroid gland, trachea, and esophagus demonstrate no significant findings. Lungs/Pleura: Small right greater than left pleural effusions. Mild interlobular septal thickening at the lung bases. Severe central peribronchial thickening. Linear scarring/atelectasis in the posterior right upper lobe. Dependent subsegmental atelectasis in the posterior right middle lobe and both lower lobes. Calcified granuloma in the lingula. No pneumothorax. Upper Abdomen: No acute abnormality. Reflux of contrast into the IVC and hepatic veins. Musculoskeletal: No chest wall abnormality. No acute or significant osseous findings. Review of the MIP images confirms the above findings. IMPRESSION: 1. No evidence of pulmonary embolism. 2. Findings consistent with congestive heart failure with small right greater than left pleural effusions and mild interstitial pulmonary edema. Evidence of right heart dysfunction. 3. Severe central peribronchial thickening, consistent with infectious or inflammatory bronchitis. 4. New ascending thoracic aortic aneurysm measuring up to 4.0 cm. Consider annual imaging followup by CTA or MRA as clinically indicated given the patient's age. This recommendation follows 2010 ACCF/AHA/AATS/ACR/ASA/SCA/SCAI/SIR/STS/SVM Guidelines for the Diagnosis and Management of Patients with Thoracic Aortic Disease. Circulation. 2010; 121: J941-D408. Aortic aneurysm NOS (ICD10-I71.9) 5. Aortic Atherosclerosis (ICD10-I70.0). Electronically Signed   By:  Titus Dubin M.D.   On: 11/12/2019 20:47   DG Chest Portable 1 View  Result Date: 11/12/2019 CLINICAL DATA:  Shortness of breath EXAM: PORTABLE CHEST 1 VIEW COMPARISON:  June 26, 2019 FINDINGS: The lungs are clear. Heart is mildly enlarged with pulmonary vascularity normal. Postoperative changes from ovary artery bypass grafting noted. Adenopathy. No bone lesions. IMPRESSION: Stable cardiac prominence.  Postoperative change.  Lungs clear. Electronically Signed   By: Lowella Grip III M.D.   On: 11/12/2019 17:03   ECHOCARDIOGRAM COMPLETE  Result Date: 11/13/2019    ECHOCARDIOGRAM REPORT   Patient Name:   Leonard [name] Date of Exam: 11/13/2019 Medical Rec #:  [id number]       Height:       70.0 in Accession #:    [id number]      Weight:       146.0 lb Date of Birth:  [birth year]        BSA:          1.826 m Patient Age:    42 years        BP:           149/81 mmHg Patient Gender: M               HR:           78 bpm. Exam Location:  Inpatient Procedure: 2D Echo, Color Doppler and Cardiac Doppler Indications:    I50.9* Heart failure (  unspecified)  History:        Patient has prior history of Echocardiogram examinations, most                 recent 03/12/2017. CHF; Risk Factors:Hypertension, Diabetes and                 Dyslipidemia. COVID+ on 06/26/19.  Sonographer:    Raquel Sarna Senior RDCS Referring Phys: [place]  1. Left ventricular ejection fraction, by estimation, is 40 to 45%. The left ventricle has mildly decreased function. There is mild left ventricular hypertrophy. Left ventricular diastolic parameters are indeterminate.  2. Right ventricular systolic function is moderately reduced. The right ventricular size is normal. Tricuspid regurgitation signal is inadequate for assessing PA pressure.  3. Left atrial size was moderately dilated.  4. Right atrial size was mildly dilated.  5. The mitral valve is degenerative. Mild mitral valve regurgitation.  6. The aortic valve was not  well visualized. Aortic valve regurgitation is not visualized. Mild aortic valve sclerosis is present, with no evidence of aortic valve stenosis.  7. Aortic dilatation noted. There is dilatation of the ascending aorta measuring 39 mm.  8. The inferior vena cava is dilated in size with >50% respiratory variability, suggesting right atrial pressure of 8 mmHg. FINDINGS  Left Ventricle: Left ventricular ejection fraction, by estimation, is 40 to 45%. The left ventricle has mildly decreased function. The left ventricle demonstrates global hypokinesis. The left ventricular internal cavity size was normal in size. There is  mild left ventricular hypertrophy. Left ventricular diastolic parameters are indeterminate. Right Ventricle: The right ventricular size is normal. Right vetricular wall thickness was not assessed. Right ventricular systolic function is moderately reduced. Tricuspid regurgitation signal is inadequate for assessing PA pressure. Left Atrium: Left atrial size was moderately dilated. Right Atrium: Right atrial size was mildly dilated. Pericardium: There is no evidence of pericardial effusion. Mitral Valve: The mitral valve is degenerative in appearance. Moderate mitral annular calcification. Mild mitral valve regurgitation. Tricuspid Valve: The tricuspid valve is normal in structure. Tricuspid valve regurgitation is not demonstrated. Aortic Valve: The aortic valve was not well visualized. Aortic valve regurgitation is not visualized. Mild aortic valve sclerosis is present, with no evidence of aortic valve stenosis. Pulmonic Valve: The pulmonic valve was not well visualized. Pulmonic valve regurgitation is not visualized. Aorta: Aortic dilatation noted. There is dilatation of the ascending aorta measuring 39 mm. Venous: The inferior vena cava is dilated in size with greater than 50% respiratory variability, suggesting right atrial pressure of 8 mmHg. IAS/Shunts: The interatrial septum was not well visualized.   LEFT VENTRICLE PLAX 2D LVIDd:         5.00 cm LVIDs:         4.10 cm LV PW:         1.10 cm LV IVS:        1.20 cm LVOT diam:     1.80 cm LV SV:         39 LV SV Index:   22 LVOT Area:     2.54 cm  LV Volumes (MOD) LV vol d, MOD A2C: 85.6 ml LV vol d, MOD A4C: 164.0 ml LV vol s, MOD A2C: 50.7 ml LV vol s, MOD A4C: 91.4 ml LV SV MOD A2C:     34.9 ml LV SV MOD A4C:     164.0 ml LV SV MOD BP:      52.0 ml RIGHT VENTRICLE RV S prime:  5.77 cm/s TAPSE (M-mode): 1.0 cm LEFT ATRIUM              Index       RIGHT ATRIUM           Index LA diam:        4.10 cm  2.25 cm/m  RA Area:     20.60 cm LA Vol (A2C):   118.0 ml 64.62 ml/m RA Volume:   58.20 ml  31.87 ml/m LA Vol (A4C):   58.7 ml  32.15 ml/m LA Biplane Vol: 88.6 ml  48.52 ml/m  AORTIC VALVE LVOT Vmax:   101.00 cm/s LVOT Vmean:  65.100 cm/s LVOT VTI:    0.155 m  AORTA Ao Root diam: 3.10 cm Ao Asc diam:  3.90 cm  SHUNTS Systemic VTI:  0.16 m Systemic Diam: 1.80 cm Oswaldo Milian MD Electronically signed by Oswaldo Milian MD Signature Date/Time: 11/13/2019/3:53:36 PM    Final         Scheduled Meds: . amLODipine  5 mg Oral BID  . aspirin  81 mg Oral Daily  . enoxaparin (LOVENOX) injection  30 mg Subcutaneous QHS  . feeding supplement (ENSURE ENLIVE)  237 mL Oral QHS  . furosemide  40 mg Oral Daily  . insulin aspart  0-9 Units Subcutaneous TID WC  . insulin glargine  9 Units Subcutaneous Daily  . isosorbide mononitrate  30 mg Oral Daily  . latanoprost  1 drop Both Eyes QHS  . losartan  100 mg Oral Daily  . mirabegron ER  25 mg Oral Daily  . multivitamin with minerals  1 tablet Oral Daily  . tamsulosin  0.4 mg Oral Daily  . vitamin B-12  1,000 mcg Oral Daily   Continuous Infusions:  Assessment & Plan:   Principal Problem:   Acute CHF (congestive heart failure) (HCC) Active Problems:   Uncontrolled type 2 diabetes with renal manifestation (HCC)   Essential hypertension   Chronic renal insufficiency, stage III (moderate)    CHF (congestive heart failure) (HCC)   Complete heart block (HCC)   Acute encephalopathy  1-Acute on chronic systolic heart failure exacerbation;  CT with pulmonary edema, Elevated BNP Cards following.  Lasix switch from iv to po. bnp less but still elevated from baseline Upper airway wheezing mildly, will monitor, if need to will give dose of iv steroid.  2-Complete Heart Block Cardiology has been consulted.  Avoid AV block agents.  Asymptomatic.   3-Pancytopenia; WBC normalized. Platelet mildly low.  Suspect related to B 12 deficiency./ iron deficiency Started B 12 supplement. Iron   Diabetes type 2; Continue with SSI.  Continue with lantus.   Acute Metabolic encephalopathy;  Suspect multifactorial., related to acute illness, Hypoxemia, B 12 deficiency.  Support care.  Start B-12 supplement.  Hold gabapentin for now.   HTN;  Continue amlodipine, losartan, Lasix switched to p.o. daily Started on Imdur from isordil. Avoid hydralazine for now as it may drop bp too much  CKD stage III; stable.    CAD, S/P CABG. Continue with aspirin. see above. Ax  Sundowning/delirium-will add seroquel for now    Estimated body mass index is 20.95 kg/m as calculated from the following:   Height as of this encounter: 5\' 10"  (1.778 m).   Weight as of this encounter: 66.2 kg.   DVT prophylaxis: Lovenox.  Code Status: DNR Family Communication: daughter  Disposition Plan:  Status is: Inpatient  Remains inpatient appropriate because:Hemodynamically unstable   Dispo: The patient is from:  SNF  Anticipated d/c is to: SNF  Anticipated d/c date is: 3 days  Patient currently is not medically stable to d/c.       LOS: 2 days   Time spent: 45 min with >50% on coc    Nolberto Hanlon, MD [facility] Pager [phone]  If 7PM-7AM, please contact night-coverage [email or web address] Password [email or web address] 11/14/2019, 3:22 PM

## 2019-11-15 ENCOUNTER — Telehealth: Payer: Self-pay | Admitting: Nurse Practitioner

## 2019-11-15 DIAGNOSIS — E440 Moderate protein-calorie malnutrition: Secondary | ICD-10-CM | POA: Insufficient documentation

## 2019-11-15 DIAGNOSIS — N183 Chronic kidney disease, stage 3 unspecified: Secondary | ICD-10-CM

## 2019-11-15 LAB — GLUCOSE, CAPILLARY
Glucose-Capillary: 136 mg/dL — ABNORMAL HIGH (ref 70–99)
Glucose-Capillary: 144 mg/dL — ABNORMAL HIGH (ref 70–99)
Glucose-Capillary: 160 mg/dL — ABNORMAL HIGH (ref 70–99)
Glucose-Capillary: 189 mg/dL — ABNORMAL HIGH (ref 70–99)
Glucose-Capillary: 231 mg/dL — ABNORMAL HIGH (ref 70–99)

## 2019-11-15 LAB — BASIC METABOLIC PANEL
Anion gap: 14 (ref 5–15)
Anion gap: 15 (ref 5–15)
BUN: 19 mg/dL (ref 8–23)
BUN: 18 mg/dL (ref 8–23)
CO2: 23 mmol/L (ref 22–32)
CO2: 21 mmol/L — ABNORMAL LOW (ref 22–32)
Calcium: 9.1 mg/dL (ref 8.9–10.3)
Calcium: 9.1 mg/dL (ref 8.9–10.3)
Chloride: 101 mmol/L (ref 98–111)
Chloride: 100 mmol/L (ref 98–111)
Creatinine, Ser: 1.53 mg/dL — ABNORMAL HIGH (ref 0.61–1.24)
Creatinine, Ser: 1.41 mg/dL — ABNORMAL HIGH (ref 0.61–1.24)
GFR calc Af Amer: 44 mL/min — ABNORMAL LOW (ref >60)
GFR calc Af Amer: 48 mL/min — ABNORMAL LOW (ref >60)
GFR calc non Af Amer: 38 mL/min — ABNORMAL LOW (ref >60)
GFR calc non Af Amer: 41 mL/min — ABNORMAL LOW (ref >60)
Glucose, Bld: 79 mg/dL (ref 70–99)
Glucose, Bld: 133 mg/dL — ABNORMAL HIGH (ref 70–99)
Potassium: 3.3 mmol/L — ABNORMAL LOW (ref 3.5–5.1)
Potassium: 3.3 mmol/L — ABNORMAL LOW (ref 3.5–5.1)
Sodium: 138 mmol/L (ref 135–145)
Sodium: 136 mmol/L (ref 135–145)

## 2019-11-15 LAB — CBC
HCT: 43.5 % (ref 39.0–52.0)
Hemoglobin: 14.7 g/dL (ref 13.0–17.0)
MCH: 28.7 pg (ref 26.0–34.0)
MCHC: 33.8 g/dL (ref 30.0–36.0)
MCV: 84.8 fL (ref 80.0–100.0)
Platelets: 153 10*3/uL (ref 150–400)
RBC: 5.13 MIL/uL (ref 4.22–5.81)
RDW: 14.7 % (ref 11.5–15.5)
WBC: 5.3 10*3/uL (ref 4.0–10.5)
nRBC: 0 % (ref 0.0–0.2)

## 2019-11-15 MED ORDER — ENSURE ENLIVE PO LIQD: 237.0000 mL | Freq: Every day | ORAL | 12 refills | Status: DC

## 2019-11-15 MED ORDER — FUROSEMIDE 40 MG PO TABS: 40.0000 mg | ORAL_TABLET | Freq: Every day | ORAL | 0 refills | Status: DC

## 2019-11-15 MED ORDER — HALOPERIDOL LACTATE 5 MG/ML IJ SOLN: 5.0000 mg | Freq: Three times a day (TID) | INTRAMUSCULAR | Status: DC | PRN

## 2019-11-15 MED ORDER — CYANOCOBALAMIN 1000 MCG PO TABS: 1000.0000 ug | ORAL_TABLET | Freq: Every day | ORAL | 0 refills | Status: DC

## 2019-11-15 MED ORDER — POTASSIUM CHLORIDE CRYS ER 20 MEQ PO TBCR
20.0000 meq | EXTENDED_RELEASE_TABLET | Freq: Once | ORAL | Status: AC
  Administered 2019-11-15: 20 meq via ORAL
  Filled 2019-11-15: qty 1

## 2019-11-15 MED ORDER — POTASSIUM CHLORIDE CRYS ER 10 MEQ PO TBCR
10.0000 meq | EXTENDED_RELEASE_TABLET | Freq: Every day | ORAL | Status: DC
  Administered 2019-11-15 – 2019-11-16 (×2): 10 meq via ORAL
  Filled 2019-11-15 (×2): qty 1

## 2019-11-15 MED ORDER — POTASSIUM CHLORIDE CRYS ER 10 MEQ PO TBCR: 10.0000 meq | EXTENDED_RELEASE_TABLET | Freq: Every day | ORAL | 0 refills | Status: DC

## 2019-11-15 MED ORDER — HALOPERIDOL 5 MG PO TABS
5.0000 mg | ORAL_TABLET | Freq: Three times a day (TID) | ORAL | Status: DC | PRN
  Filled 2019-11-15: qty 1

## 2019-11-15 MED ORDER — AMLODIPINE BESYLATE 5 MG PO TABS: 5.0000 mg | ORAL_TABLET | Freq: Two times a day (BID) | ORAL | Status: DC

## 2019-11-15 MED ORDER — LORAZEPAM 2 MG/ML IJ SOLN
1.0000 mg | Freq: Once | INTRAMUSCULAR | Status: AC
  Administered 2019-11-15: 1 mg via INTRAMUSCULAR
  Filled 2019-11-15: qty 1

## 2019-11-15 MED ORDER — ISOSORBIDE MONONITRATE ER 30 MG PO TB24: 30.0000 mg | ORAL_TABLET | Freq: Every day | ORAL | 0 refills | Status: DC

## 2019-11-15 NOTE — NC FL2 (Signed)
[place] MEDICAID FL2 LEVEL OF CARE SCREENING TOOL     IDENTIFICATION  Patient Name: Leonard [name] Birthdate: 1920-10-11 Sex: male Admission Date (Current Location): 11/12/2019  [facility] and [place] Number:  Herbalist and Address:  [facility] [place], [place] [address], [place], [place] [address]      Provider Number: [id number]  Attending Physician Name and Address:  Nolberto Hanlon, MD  Relative Name and Phone Number:  Irene Pap    Current Level of Care: Hospital Recommended Level of Care: [place] Prior Approval Number:    Date Approved/Denied:   PASRR Number: [id number]  Discharge Plan: SNF    Current Diagnoses: Patient Active Problem List   Diagnosis Date Noted  . Malnutrition of moderate degree 11/15/2019  . Complete heart block ([place]) 11/13/2019  . Acute encephalopathy 11/13/2019  . Acute CHF (congestive heart failure) ([place]) 11/12/2019  . CHF (congestive heart failure) ([place]) 11/12/2019  . Moderate nonproliferative diabetic retinopathy of both eyes ([place]) 09/26/2019  . Pseudophakia 09/26/2019  . Posterior vitreous detachment of both eyes 09/26/2019  . 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 COVID-19 virus detected 06/26/2019  . Thrombocytopenia ([place]) 03/12/2017  . Mass of skin of right shoulder 03/12/2017  . Normal pressure primary open angle glaucoma (POAG) 03/11/2017  . Hiatal hernia 03/11/2017  . Chest pain 03/11/2017  . Left leg weakness 06/29/2016  . Degenerative arthritis of left knee 05/25/2016  . Left knee pain 03/24/2016  . History of prostate cancer 06/12/2014  . Chronic renal insufficiency, stage III (moderate) 06/12/2014  . Non-compliant behavior 10/18/2013  . Abnormal computed tomography of pancreas or bile duct 09/21/2011  . Low back pain with sciatica 07/07/2010  . Uncontrolled type 2 diabetes with renal manifestation ([place]) 03/06/2008  . Hyperlipidemia  03/06/2008  . Hx of completed stroke 03/06/2008  . Diabetes ([place]) 07/26/2007  . Essential hypertension 07/26/2007  . CAROTID BRUIT 07/26/2007  . Coronary atherosclerosis 07/04/2006  . OSTEOPOROSIS 07/04/2006    Orientation RESPIRATION BLADDER Height & Weight     Self  Normal Incontinent, External catheter Weight: 137 lb (62.1 kg) Height:  5\' 4"  (162.6 cm)  BEHAVIORAL SYMPTOMS/MOOD NEUROLOGICAL BOWEL NUTRITION STATUS      Continent Diet (See discharge summary)  AMBULATORY STATUS COMMUNICATION OF NEEDS Skin   Extensive Assist Verbally Normal                       Personal Care Assistance Level of Assistance  Bathing, Feeding, Dressing Bathing Assistance: Maximum assistance Feeding assistance: Maximum assistance Dressing Assistance: Maximum assistance     Functional Limitations Info  Sight, Speech, Hearing Sight Info: Adequate Hearing Info: Adequate Speech Info: Adequate    SPECIAL CARE FACTORS FREQUENCY  PT (By licensed PT), OT (By licensed OT)     PT Frequency: 5x a week OT Frequency: 5x a week            Contractures Contractures Info: Not present    Additional Factors Info  Code Status, Allergies Code Status Info: DNR Allergies Info: Codeine; Microzide (hydrochlorothiazide); Ace Inhibitors           Current Medications (11/15/2019):  This is the current hospital active medication list Current Facility-Administered Medications  Medication Dose Route Frequency Provider Last Rate Last Admin  . amLODipine (NORVASC) tablet 5 mg  5 mg Oral BID Fay Records, MD   5 mg at 11/15/19 0911  . aspirin chewable tablet 81  mg  81 mg Oral Daily Rise Patience, MD   81 mg at 11/15/19 0911  . enoxaparin (LOVENOX) injection 30 mg  30 mg Subcutaneous QHS Rise Patience, MD   30 mg at 11/14/19 2036  . feeding supplement (ENSURE ENLIVE) (ENSURE ENLIVE) liquid 237 mL  237 mL Oral QHS Regalado, Belkys A, MD   237 mL at 11/14/19 2047  . furosemide (LASIX) tablet  40 mg  40 mg Oral Daily Fay Records, MD   40 mg at 11/15/19 0911  . insulin aspart (novoLOG) injection 0-9 Units  0-9 Units Subcutaneous TID WC Rise Patience, MD   1 Units at 11/15/19 1325  . insulin glargine (LANTUS) injection 9 Units  9 Units Subcutaneous Daily Rise Patience, MD   9 Units at 11/15/19 0912  . isosorbide mononitrate (IMDUR) 24 hr tablet 30 mg  30 mg Oral Daily Fay Records, MD   30 mg at 11/15/19 0911  . latanoprost (XALATAN) 0.005 % ophthalmic solution 1 drop  1 drop Both Eyes QHS Rise Patience, MD   1 drop at 11/14/19 2037  . losartan (COZAAR) tablet 100 mg  100 mg Oral Daily Rise Patience, MD   100 mg at 11/15/19 0911  . mirabegron ER (MYRBETRIQ) tablet 25 mg  25 mg Oral Daily Rise Patience, MD   25 mg at 11/15/19 0912  . multivitamin with minerals tablet 1 tablet  1 tablet Oral Daily Regalado, Belkys A, MD   1 tablet at 11/15/19 0912  . potassium chloride (KLOR-CON) CR tablet 10 mEq  10 mEq Oral Daily Fay Records, MD   10 mEq at 11/15/19 1340  . potassium chloride SA (KLOR-CON) CR tablet 20 mEq  20 mEq Oral Once Nolberto Hanlon, MD      . tamsulosin (FLOMAX) capsule 0.4 mg  0.4 mg Oral Daily Rise Patience, MD   0.4 mg at 11/15/19 0911  . vitamin B-12 (CYANOCOBALAMIN) tablet 1,000 mcg  1,000 mcg Oral Daily Regalado, Belkys A, MD   1,000 mcg at 11/15/19 1340     Discharge Medications: Please see discharge summary for a list of discharge medications.  Relevant Imaging Results:  Relevant Lab Results:   Additional Information SSN [id number]  Neysa Hotter [place], [place]

## 2019-11-15 NOTE — Progress Notes (Addendum)
Progress Note  Patient Name: Leonard [name] Date of Encounter: 11/15/2019  [facility] Cardiologist:  Subjective   Pt awake, comfortable this AM    In restraints as very agitated last night   Inpatient Medications    Scheduled Meds: . amLODipine  5 mg Oral BID  . aspirin  81 mg Oral Daily  . enoxaparin (LOVENOX) injection  30 mg Subcutaneous QHS  . feeding supplement (ENSURE ENLIVE)  237 mL Oral QHS  . furosemide  40 mg Oral Daily  . insulin aspart  0-9 Units Subcutaneous TID WC  . insulin glargine  9 Units Subcutaneous Daily  . isosorbide mononitrate  30 mg Oral Daily  . latanoprost  1 drop Both Eyes QHS  . losartan  100 mg Oral Daily  . mirabegron ER  25 mg Oral Daily  . multivitamin with minerals  1 tablet Oral Daily  . QUEtiapine  25 mg Oral QHS  . tamsulosin  0.4 mg Oral Daily  . vitamin B-12  1,000 mcg Oral Daily   Continuous Infusions:  PRN Meds:    Vital Signs    Vitals:   11/15/19 0015 11/15/19 0332 11/15/19 0548 11/15/19 0710  BP: 139/74  (!) 145/72 (!) 139/92  Pulse: 68  72 88  Resp: 19  19 16   Temp: 97.8 F (36.6 C)  (!) 97.3 F (36.3 C) (!) 97.5 F (36.4 C)  TempSrc: Oral  Oral Oral  SpO2:  100% 100% 100%  Weight:   62.1 kg   Height:        Intake/Output Summary (Last 24 hours) at 11/15/2019 0810 Last data filed at 11/15/2019 0803 Gross per 24 hour  Intake 1257 ml  Output 1200 ml  Net 57 ml   Net neg 1.7 L     Last 3 Weights 11/15/2019 11/14/2019 11/13/2019  Weight (lbs) 137 lb 130 lb 14.4 oz 139 lb  Weight (kg) 62.143 kg 59.376 kg 63.05 kg      Telemetry    SR  No signif bradycardia   - Personally Reviewed  ECG    -None Personally Reviewed  Physical Exam   GEN: No acute distress.   Neck: No JVD Cardiac: RRR, no murmurs Respiratory: Clear to auscultation bilaterally GI: Soft, nontender, non-distended  MS: No edema; No deformity. Neuro:  Nonfocal  Psych: Normal affect   Labs    High Sensitivity Troponin:   Recent  Labs  Lab 11/12/19 1622 11/12/19 1816 11/13/19 0804 11/13/19 1218  TROPONINIHS 38* 49* 47* 49*      Chemistry Recent Labs  Lab 11/12/19 1622 11/13/19 0804 11/14/19 0536  NA 135 139 138  K 4.7 3.7 4.5  CL 106 101 103  CO2 19* 23 23  GLUCOSE 155* 139* 160*  BUN 18 15 17   CREATININE 1.33* 1.31*  1.31* 1.42*  CALCIUM 8.2* 8.9 9.0  PROT 5.9* 6.8  --   ALBUMIN 3.3* 3.5  --   AST 25 17  --   ALT 15 20  --   ALKPHOS 102 115  --   BILITOT 1.2 0.7  --   GFRNONAA 44* 45*  45* 41*  GFRAA 52* 53*  53* 48*  ANIONGAP 10 15 12      Hematology Recent Labs  Lab 11/12/19 1622 11/13/19 0804  WBC 3.7* 4.2  RBC 4.46 4.89  4.87  HGB 12.8* 13.9  HCT 39.1 42.7  MCV 87.7 87.3  MCH 28.7 28.4  MCHC 32.7 32.6  RDW 14.9 15.0  PLT  101* 149*    BNP Recent Labs  Lab 11/12/19 1630 11/14/19 1032  BNP 447.9* 311.7*     DDimer  Recent Labs  Lab 11/12/19 1745  DDIMER 1.67*     Radiology    ECHOCARDIOGRAM COMPLETE  Result Date: 11/13/2019    ECHOCARDIOGRAM REPORT   Patient Name:   Leonard [name] Date of Exam: 11/13/2019 Medical Rec #:  [id number]       Height:       70.0 in Accession #:    [id number]      Weight:       146.0 lb Date of Birth:  [birth year]        BSA:          1.826 m Patient Age:    86 years        BP:           149/81 mmHg Patient Gender: M               HR:           78 bpm. Exam Location:  Inpatient Procedure: 2D Echo, Color Doppler and Cardiac Doppler Indications:    I50.9* Heart failure (unspecified)  History:        Patient has prior history of Echocardiogram examinations, most                 recent 03/12/2017. CHF; Risk Factors:Hypertension, Diabetes and                 Dyslipidemia. COVID+ on 06/26/19.  Sonographer:    Raquel Sarna Senior RDCS Referring Phys: [place]  1. Left ventricular ejection fraction, by estimation, is 40 to 45%. The left ventricle has mildly decreased function. There is mild left ventricular hypertrophy. Left ventricular  diastolic parameters are indeterminate.  2. Right ventricular systolic function is moderately reduced. The right ventricular size is normal. Tricuspid regurgitation signal is inadequate for assessing PA pressure.  3. Left atrial size was moderately dilated.  4. Right atrial size was mildly dilated.  5. The mitral valve is degenerative. Mild mitral valve regurgitation.  6. The aortic valve was not well visualized. Aortic valve regurgitation is not visualized. Mild aortic valve sclerosis is present, with no evidence of aortic valve stenosis.  7. Aortic dilatation noted. There is dilatation of the ascending aorta measuring 39 mm.  8. The inferior vena cava is dilated in size with >50% respiratory variability, suggesting right atrial pressure of 8 mmHg. FINDINGS  Left Ventricle: Left ventricular ejection fraction, by estimation, is 40 to 45%. The left ventricle has mildly decreased function. The left ventricle demonstrates global hypokinesis. The left ventricular internal cavity size was normal in size. There is  mild left ventricular hypertrophy. Left ventricular diastolic parameters are indeterminate. Right Ventricle: The right ventricular size is normal. Right vetricular wall thickness was not assessed. Right ventricular systolic function is moderately reduced. Tricuspid regurgitation signal is inadequate for assessing PA pressure. Left Atrium: Left atrial size was moderately dilated. Right Atrium: Right atrial size was mildly dilated. Pericardium: There is no evidence of pericardial effusion. Mitral Valve: The mitral valve is degenerative in appearance. Moderate mitral annular calcification. Mild mitral valve regurgitation. Tricuspid Valve: The tricuspid valve is normal in structure. Tricuspid valve regurgitation is not demonstrated. Aortic Valve: The aortic valve was not well visualized. Aortic valve regurgitation is not visualized. Mild aortic valve sclerosis is present, with no evidence of aortic valve stenosis.  Pulmonic Valve: The pulmonic valve was  not well visualized. Pulmonic valve regurgitation is not visualized. Aorta: Aortic dilatation noted. There is dilatation of the ascending aorta measuring 39 mm. Venous: The inferior vena cava is dilated in size with greater than 50% respiratory variability, suggesting right atrial pressure of 8 mmHg. IAS/Shunts: The interatrial septum was not well visualized.  LEFT VENTRICLE PLAX 2D LVIDd:         5.00 cm LVIDs:         4.10 cm LV PW:         1.10 cm LV IVS:        1.20 cm LVOT diam:     1.80 cm LV SV:         39 LV SV Index:   22 LVOT Area:     2.54 cm  LV Volumes (MOD) LV vol d, MOD A2C: 85.6 ml LV vol d, MOD A4C: 164.0 ml LV vol s, MOD A2C: 50.7 ml LV vol s, MOD A4C: 91.4 ml LV SV MOD A2C:     34.9 ml LV SV MOD A4C:     164.0 ml LV SV MOD BP:      52.0 ml RIGHT VENTRICLE RV S prime:     5.77 cm/s TAPSE (M-mode): 1.0 cm LEFT ATRIUM              Index       RIGHT ATRIUM           Index LA diam:        4.10 cm  2.25 cm/m  RA Area:     20.60 cm LA Vol (A2C):   118.0 ml 64.62 ml/m RA Volume:   58.20 ml  31.87 ml/m LA Vol (A4C):   58.7 ml  32.15 ml/m LA Biplane Vol: 88.6 ml  48.52 ml/m  AORTIC VALVE LVOT Vmax:   101.00 cm/s LVOT Vmean:  65.100 cm/s LVOT VTI:    0.155 m  AORTA Ao Root diam: 3.10 cm Ao Asc diam:  3.90 cm  SHUNTS Systemic VTI:  0.16 m Systemic Diam: 1.80 cm Oswaldo Milian MD Electronically signed by Oswaldo Milian MD Signature Date/Time: 11/13/2019/3:53:36 PM    Final     Cardiac Studies     Patient Profile     86 y.o. male hx of CAD, CHF   Admitted with  SOB  Noted to have conduction dz.  Assessment & Plan   1  Rhythm   Pt remains in SR  Again,  No significant bradycardia or pauses   No sx of worsening AV block   Follow  2  Acute on chronic systolic CHF   Volume is OK   His ReDS vest reading was a little high yesterday   He is comfortable    I would not push further given other issues  Send home on a little oral lasix   With 10 KCL   This can be followed with BMET in about 1 wk  Will arrange for outpt f/u   3  HTN  BP is good on current regimen   4  CAD  No symptoms of angina   5  Psych  Pt very agitated .   Hosp setting is probably contributing   I would recomm  D/c back to home   Will probably need extra assist until reacclimated to surroundings  Will make sure he has f/u in clinic    For questions or updates, please contact [place] Please consult [email or web address] for contact info under  Signed, Dorris Carnes, MD  11/15/2019, 8:10 AM

## 2019-11-15 NOTE — Progress Notes (Signed)
Soft wrist restraints d/c per MD order.  Family currently present at the bedside.  Pt. Calm and corporative.

## 2019-11-15 NOTE — TOC Initial Note (Signed)
Transition of Care [facility]) - Initial/Assessment Note    Patient Details  Name: Leonard [name] MRN: [id number] Date of Birth: 1920-08-17  [facility] CM/SW Contact:    Jacquelynn Cree Phone Number: 11/15/2019, 5:22 PM  Clinical Narrative:                 Patient from [facility] ALF and was highly independent PTA. CSW spoke with patient's daughter Neoma Laming and son-in-law Lorayne Bender regarding discharge plan and PT recommendation. Family is in agreement with SNF placement at discharge and provided permission for patient to be faxed out to [facility]. CSW provided information on Medicare.gov for ratings and answered questions.   CSW spoke with Kirke Shaggy of [facility] and provided an update on patient and discharge plan for SNF. CSW will continue to follow and assist with discharge planning needs.  Expected Discharge Plan: Skilled Nursing Facility Barriers to Discharge: Continued Medical Work up, [place] (PASRR)   Patient Goals and CMS Choice   CMS Medicare.gov Compare Post Acute Care list provided to:: Patient Represenative (must comment) Neoma Laming) Choice offered to / list presented to : Adult Children  Expected Discharge Plan and Services Expected Discharge Plan: [place] arrangements for the past 2 months: [place] Expected Discharge Date: 11/15/19                                    Prior Living Arrangements/Services Living arrangements for the past 2 months: [place] Lives with:: Facility Resident Patient language and need for interpreter reviewed:: Yes Do you feel safe going back to the place where you live?: Yes      Need for Family Participation in Patient Care: Yes (Comment) Care giver support system in place?: Yes (comment)   Criminal Activity/Legal Involvement Pertinent to Current Situation/Hospitalization: No - Comment as needed  Activities of Daily  Living Home Assistive Devices/Equipment: Gilford Rile (specify type) ADL Screening (condition at time of admission) Patient's cognitive ability adequate to safely complete daily activities?: Yes Is the patient deaf or have difficulty hearing?: No Does the patient have difficulty seeing, even when wearing glasses/contacts?: No Does the patient have difficulty concentrating, remembering, or making decisions?: Yes Patient able to express need for assistance with ADLs?: Yes Does the patient have difficulty dressing or bathing?: No Independently performs ADLs?: Yes (appropriate for developmental age) Does the patient have difficulty walking or climbing stairs?: Yes Weakness of Legs: Both Weakness of Arms/Hands: None  Permission Sought/Granted Permission sought to share information with : Facility Sport and exercise psychologist, Family Supports    Share Information with NAME: Neoma Laming  Permission granted to share info w AGENCY: SNFs  Permission granted to share info w Relationship: Daughter  Permission granted to share info w Contact Information: [phone]  Emotional Assessment   Attitude/Demeanor/Rapport: Unable to Assess Affect (typically observed): Unable to Assess Orientation: : Oriented to Self Alcohol / Substance Use: Not Applicable Psych Involvement: No (comment)  Admission diagnosis:  CHF (congestive heart failure) (HCC) [I50.9] Acute CHF (congestive heart failure) (HCC) [I50.9] Acute on chronic diastolic congestive heart failure ([place]) [I50.33] Patient Active Problem List   Diagnosis Date Noted  . Malnutrition of moderate degree 11/15/2019  . Complete heart block ([place]) 11/13/2019  . Acute encephalopathy 11/13/2019  . Acute CHF (congestive heart failure) ([place]) 11/12/2019  . CHF (congestive heart failure) ([place]) 11/12/2019  . Moderate nonproliferative  diabetic retinopathy of both eyes ([name]) 09/26/2019  . Pseudophakia 09/26/2019  . Posterior vitreous detachment of both eyes 09/26/2019  .  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 COVID-19 virus detected 06/26/2019  . Thrombocytopenia ([place]) 03/12/2017  . Mass of skin of right shoulder 03/12/2017  . Normal pressure primary open angle glaucoma (POAG) 03/11/2017  . Hiatal hernia 03/11/2017  . Chest pain 03/11/2017  . Left leg weakness 06/29/2016  . Degenerative arthritis of left knee 05/25/2016  . Left knee pain 03/24/2016  . History of prostate cancer 06/12/2014  . Chronic renal insufficiency, stage III (moderate) 06/12/2014  . Non-compliant behavior 10/18/2013  . Abnormal computed tomography of pancreas or bile duct 09/21/2011  . Low back pain with sciatica 07/07/2010  . Uncontrolled type 2 diabetes with renal manifestation ([place]) 03/06/2008    Class: Chronic  . Hyperlipidemia 03/06/2008  . Hx of completed stroke 03/06/2008  . Diabetes ([place]) 07/26/2007  . Essential hypertension 07/26/2007    Class: Chronic  . CAROTID BRUIT 07/26/2007  . Coronary atherosclerosis 07/04/2006    Class: Chronic  . OSTEOPOROSIS 07/04/2006   PCP:  Hoyt Koch, MD Pharmacy:   [facility] [address] [place] [address] Phone: [phone] Fax: [phone]     Social Determinants of Health (SDOH) Interventions    Readmission Risk Interventions No flowsheet data found.

## 2019-11-15 NOTE — Evaluation (Signed)
Physical Therapy Evaluation Patient Details Name: Leonard [name] MRN: [id number] DOB: [birth year] Today's Date: 11/15/2019   History of Present Illness  Pt is a 86 y/o male admitted secondary to CHF exacerbation and acute encephalopathy. PMH includes DM, HTN, CHF, CAD s/p CABG, and prostate cancer.   Clinical Impression  Pt admitted secondary to problem above with deficits below. Pt with poor cognition and demonstrated poor activity tolerance. Required mod A to stand and march at EOB. Pt with increased SOB; oxygen sats at 92-93% on RA. Per pt's son in law, pt's ALF will not be able to provide increased assist. Feel pt would benefit from SNF level therapies prior to return to ALF. Will continue to follow acutely to maximize functional mobility independence and safety.     Follow Up Recommendations SNF;Supervision/Assistance - 24 hour    Equipment Recommendations  None recommended by PT    Recommendations for Other Services       Precautions / Restrictions Precautions Precautions: Fall;Other (comment) Precaution Comments: pt in wrist restraints Restrictions Weight Bearing Restrictions: No      Mobility  Bed Mobility Overal bed mobility: Needs Assistance Bed Mobility: Supine to Sit;Sit to Supine     Supine to sit: Mod assist Sit to supine: Supervision   General bed mobility comments: Mod A for assist with LEs and trunk to sit at EOB. Also required assist with scooting hips to EOB. Pt with difficulty sequencing and requiring multimodal cues.   Transfers Overall transfer level: Needs assistance Equipment used: Rolling walker (2 wheeled) Transfers: Sit to/from Stand Sit to Stand: Mod assist         General transfer comment: Mod A for lift assist and steadying. Pt with posterior lean and required assist to correct.   Ambulation/Gait                Stairs            Wheelchair Mobility    Modified Rankin (Stroke Patients Only)       Balance Overall  balance assessment: Needs assistance Sitting-balance support: No upper extremity supported;Feet supported Sitting balance-Leahy Scale: Fair     Standing balance support: Bilateral upper extremity supported;During functional activity Standing balance-Leahy Scale: Poor Standing balance comment: Reliant on BUE support and external support.                              Pertinent Vitals/Pain Pain Assessment: No/denies pain    Home Living Family/patient expects to be discharged to:: Assisted living               Home Equipment: Walker - 2 wheels      Prior Function Level of Independence: Independent with assistive device(s)         Comments: Pt's son in law reports he is independent with RW.      Hand Dominance        Extremity/Trunk Assessment   Upper Extremity Assessment Upper Extremity Assessment: Generalized weakness    Lower Extremity Assessment Lower Extremity Assessment: Generalized weakness    Cervical / Trunk Assessment Cervical / Trunk Assessment: Kyphotic  Communication   Communication: HOH  Cognition Arousal/Alertness: Awake/alert Behavior During Therapy: Flat affect Overall Cognitive Status: Impaired/Different from baseline Area of Impairment: Orientation;Attention;Memory;Following commands;Safety/judgement;Awareness;Problem solving                 Orientation Level: Disoriented to;Place;Time;Situation Current Attention Level: Sustained Memory: Decreased short-term memory;Decreased recall of precautions Following  Commands: Follows one step commands with increased time Safety/Judgement: Decreased awareness of deficits;Decreased awareness of safety Awareness: Intellectual Problem Solving: Slow processing;Difficulty sequencing;Requires verbal cues;Requires tactile cues General Comments: Pt requiring multimodal cues for sequencing. Pt's son in law reports he is far from baseline cognition.       General Comments General  comments (skin integrity, edema, etc.): Pt's son in law and grandson present. Oxygen sats at 92-93% on RA.     Exercises     Assessment/Plan    PT Assessment Patient needs continued PT services  PT Problem List Decreased strength;Decreased activity tolerance;Decreased balance;Decreased mobility;Decreased coordination;Decreased knowledge of use of DME;Decreased cognition;Decreased safety awareness;Decreased knowledge of precautions       PT Treatment Interventions DME instruction;Gait training;Therapeutic exercise;Functional mobility training;Therapeutic activities;Balance training;Patient/family education;Cognitive remediation;Neuromuscular re-education    PT Goals (Current goals can be found in the Care Plan section)  Acute Rehab PT Goals Patient Stated Goal: for pt to be independent per son in law PT Goal Formulation: With family Time For Goal Achievement: 11/29/19 Potential to Achieve Goals: Good    Frequency Min 2X/week   Barriers to discharge        Co-evaluation               AM-PAC PT "6 Clicks" Mobility  Outcome Measure Help needed turning from your back to your side while in a flat bed without using bedrails?: A Little Help needed moving from lying on your back to sitting on the side of a flat bed without using bedrails?: A Lot Help needed moving to and from a bed to a chair (including a wheelchair)?: A Lot Help needed standing up from a chair using your arms (e.g., wheelchair or bedside chair)?: A Lot Help needed to walk in hospital room?: A Lot Help needed climbing 3-5 steps with a railing? : Total 6 Click Score: 12    End of Session Equipment Utilized During Treatment: Gait belt Activity Tolerance: Patient limited by fatigue Patient left: in bed;with call bell/phone within reach;with restraints reapplied;with family/visitor present Nurse Communication: Mobility status PT Visit Diagnosis: Unsteadiness on feet (R26.81);Muscle weakness (generalized)  (M62.81)    Time: 8757-9728 PT Time Calculation (min) (ACUTE ONLY): 18 min   Charges:   PT Evaluation $PT Eval Moderate Complexity: 1 Mod          Reuel Derby, PT, DPT  Acute Rehabilitation Services  Pager: [phone] Office: [phone]   Rudean Hitt 11/15/2019, 3:50 PM

## 2019-11-15 NOTE — Progress Notes (Signed)
Wrist restraints applied

## 2019-11-15 NOTE — Telephone Encounter (Signed)
New Message  Per PA set up [facility] Visit on 12/05/19 at 11:15 am with Leonard [name]

## 2019-11-15 NOTE — Discharge Summary (Addendum)
Leonard [name] HQI:[id number] DOB: 02-25-1921 DOA: 11/12/2019  PCP: Hoyt Koch, MD  Admit date: 11/12/2019 Discharge date:11/20/19 Admitted From: [facility] Disposition:  [facility]  Recommendations for Outpatient Follow-up:  1. Follow up with PCP in 1 week 2. Please obtain BMP/CBC in one week 3. Cardiology in one week 4. chf clinic in one week     Discharge Condition:Stable CODE STATUSdnr Diet recommendation: Heart Healthy / Carb Modified  Brief/Interim Summary: Leonard [name] is a 86 y.o. male with history of CAD status post CABG, ischemic cardiomyopathy last EF measured in 2018 was 46 to 50%, hypertension diabetes mellitus type 2, chronic kidney disease who was admitted in January of this year for Covid 19 infection was noticed to have increasing shortness of breath   Patient did not have any chest pain or fever or chills but has been having some cough.  Patient was also noticed to be increasingly confused In the ER patient was not in distress but appeared mildly hypoxic with EKG showing A-V dissociation/complete heart block but heart rate was in the 60s.  CT angiogram of the chest was done because of elevated D-dimer which showed features consistent with CHF.    Cardiology was consulted.  Addendum: Patient was supposed to be discharged however since he was sundowning and he was unrestrained he was unable to go to SNF.  For the past 72 hours he has not needed restraints and his delirium/sundowning has improved.  He is stable for discharge to SNF today.   Addendum; 11/20/19 Pt was kept overnight per SNF request for observation. This am he is doing well. Sitting in chair. Responding appropriately. Has no complaints.   1-Acute on chronic systolic heart failure exacerbation; CT with pulmonary edema, Elevated BNP Lasix switch from iv to po. From cardiac standpoint he is stable to be discharged  Will need outpatient follow-up with cardiology   2-Complete Heart  Block Cardiology has been consulted.  Avoid AV block agents.  Asymptomatic. Rhythm remains in its rhythm.  No significant bradycardia or pauses now.  Will follow as outpatient  3-Pancytopenia;WBC normalized. Platelet mildly low.  Suspect related to B 12 deficiency./ iron deficiency Started B 12 supplement.   Diabetes type 2; Continue with SSI.  Continue with lantus.  AcuteMetabolicencephalopathy; Suspect multifactorial., related to acute illness, Hypoxemia, B 12 deficiency.  Support care.  Start B-12 supplement.  Can resume gabapentin  as outpatient  HTN; Continue amlodipine, losartan, Lasix switched to p.o. daily Started on Imdur from isordil. Avoid hydralazine for now as it may drop bp too much  CKD stage III;stable.   CAD, S/P CABG.Continue with aspirin.see above. Ax  Sundowning/delirium-resolved. Family stated seroquel when used makes his sx worse.  Now improved.   Discharge Diagnoses:  Principal Problem:   Acute CHF (congestive heart failure) (HCC) Active Problems:   Uncontrolled type 2 diabetes with renal manifestation (HCC)   Essential hypertension   Chronic renal insufficiency, stage III (moderate)   CHF (congestive heart failure) (HCC)   Complete heart block (HCC)   Acute encephalopathy   Malnutrition of moderate degree    Discharge Instructions  Discharge Instructions    AMB referral to CHF clinic   Complete by: As directed    Call MD for:  difficulty breathing, headache or visual disturbances   Complete by: As directed    Call MD for:  temperature >100.4   Complete by: As directed    Diet - low sodium heart healthy   Complete by: As directed  Increase activity slowly   Complete by: As directed      Allergies as of 11/20/2019      Reactions   Codeine Nausea And Vomiting, Other (See Comments)   hallucinations   Microzide [hydrochlorothiazide] Other (See Comments)   Acute gout on LosartanHCT 12/2014   Ace Inhibitors Other  (See Comments)    cough      Medication List    STOP taking these medications   gabapentin 600 MG tablet Commonly known as: NEURONTIN   isosorbide dinitrate 10 MG tablet Commonly known as: ISORDIL   meclizine 12.5 MG tablet Commonly known as: ANTIVERT   Myrbetriq 25 MG Tb24 tablet Generic drug: mirabegron ER     TAKE these medications   amLODipine 5 MG tablet Commonly known as: NORVASC Take 1 tablet (5 mg total) by mouth 2 (two) times daily. What changed:   medication strength  how much to take  when to take this   aspirin 81 MG tablet Take 81 mg by mouth daily.   Basaglar KwikPen 100 UNIT/ML Inject 9 Units into the skin daily.   cyanocobalamin 1000 MCG tablet Take 1 tablet (1,000 mcg total) by mouth daily.   diclofenac sodium 1 % Gel Commonly known as: VOLTAREN Apply 2 g topically 2 (two) times daily as needed. What changed:   when to take this  additional instructions   feeding supplement (ENSURE ENLIVE) Liqd Take 237 mLs by mouth at bedtime.   furosemide 40 MG tablet Commonly known as: LASIX Take 1 tablet (40 mg total) by mouth daily.   insulin lispro 100 UNIT/ML injection Commonly known as: HUMALOG Inject 0-7 Units into the skin 3 (three) times daily before meals. 0-180 0 units 181-240 3 units 241-300 4 units 301-360 5 units 361-420 6 units 421+ 7 units   Insulin Pen Needle 32G X 4 MM Misc As directed per insulin dose and sliding scale What changed:   how much to take  how to take this  when to take this   isosorbide mononitrate 30 MG 24 hr tablet Commonly known as: IMDUR Take 1 tablet (30 mg total) by mouth daily.   latanoprost 0.005 % ophthalmic solution Commonly known as: XALATAN Place 1 drop into both eyes at bedtime.   losartan 100 MG tablet Commonly known as: COZAAR Take 1 tablet (100 mg total) by mouth daily. Overdue for annual appt w/labs must see MD for refills What changed: additional instructions   Omega 3 1200 MG  Caps Take 1,200 mg by mouth 2 (two) times daily.   potassium chloride SA 20 MEQ tablet Commonly known as: KLOR-CON Take 1 tablet (20 mEq total) by mouth daily.   tamsulosin 0.4 MG Caps capsule Commonly known as: FLOMAX Take 0.4 mg by mouth daily.       Contact information for follow-up providers    Josue Hector, MD Follow up on 12/05/2019.   Specialty: Cardiology Why: at 11:15 AM with his nurse practitioner Truitt Merle.  Contact information: [address] [address] [phone]        Hoyt Koch, MD Follow up in 1 week(s).   Specialty: Internal Medicine Contact information: [place] [address] [phone]            Contact information for after-discharge care    Destination    [facility] .   Service: Skilled Chiropodist information: [address] [place] [phone]  Allergies  Allergen Reactions   Codeine Nausea And Vomiting and Other (See Comments)    hallucinations   Microzide [Hydrochlorothiazide] Other (See Comments)    Acute gout on LosartanHCT 12/2014   Ace Inhibitors Other (See Comments)     cough    Consultations:   cards  Procedures/Studies: CT HEAD WO CONTRAST  Result Date: 11/13/2019 CLINICAL DATA:  Encephalopathy EXAM: CT HEAD WITHOUT CONTRAST TECHNIQUE: Contiguous axial images were obtained from the base of the skull through the vertex without intravenous contrast. COMPARISON:  CT 05/22/2017 FINDINGS: Brain: No evidence of acute infarction, hemorrhage, hydrocephalus, extra-axial collection or mass lesion/mass effect. Symmetric prominence of the ventricles, cisterns and sulci compatible with moderate and slightly frontal predominant parenchymal volume loss. Patchy areas of white matter hypoattenuation are most compatible with moderate chronic microvascular angiopathy. Partially empty appearance  of the sella similar to prior. Vascular: Atherosclerotic calcification of the carotid siphons and intradural vertebral arteries. No hyperdense vessel. Skull: No significant swelling or hematoma. No calvarial fracture or suspicious osseous lesion. Sinuses/Orbits: Postsurgical changes from prior bilateral maxillary antrostomy. Subtotal opacification of the left maxillary sinus with hyperostotic changes of the walls suggesting chronicity. No abnormal stranding or destructive changes along the sinus perimeter. Chronic leftward nasal septal deviation. Orbital structures are unremarkable aside from prior lens extractions. Other: Bilateral TMJ arthrosis, left greater than right IMPRESSION: 1. No acute intracranial findings. 2. Moderate and slightly frontal predominant parenchymal volume loss and chronic microvascular angiopathy. 3. Postsurgical changes from prior bilateral maxillary antrostomy. Subtotal opacification of the left maxillary sinus with hyperostotic changes of the walls suggesting chronicity. No abnormal stranding or destructive changes along the sinus perimeter. 4. Bilateral TMJ arthrosis, left greater than right. Electronically Signed   By: Lovena Le M.D.   On: 11/13/2019 01:00   CT Angio Chest PE W and/or Wo Contrast  Result Date: 11/12/2019 CLINICAL DATA:  Progressively worsening shortness of breath over the past 3 days. EXAM: CT ANGIOGRAPHY CHEST WITH CONTRAST TECHNIQUE: Multidetector CT imaging of the chest was performed using the standard protocol during bolus administration of intravenous contrast. Multiplanar CT image reconstructions and MIPs were obtained to evaluate the vascular anatomy. CONTRAST:  13mL OMNIPAQUE IOHEXOL 350 MG/ML SOLN COMPARISON:  Chest x-ray from same day. CT chest dated April 28, 2011. FINDINGS: Cardiovascular: Satisfactory opacification of the pulmonary arteries to the segmental level. No evidence of pulmonary embolism. Unchanged cardiomegaly. No pericardial effusion.  Prior CABG. New mild aneurysmal dilatation of the ascending thoracic aorta, measuring up to 4.0 cm. Coronary, aortic arch, and branch vessel atherosclerotic vascular disease. Mediastinum/Nodes: Mildly enlarged mediastinal and bilateral hilar lymph nodes measuring up to 1.3 cm in short axis, likely reactive. Unchanged calcified subcarinal lymph node. No enlarged axillary lymph nodes. The thyroid gland, trachea, and esophagus demonstrate no significant findings. Lungs/Pleura: Small right greater than left pleural effusions. Mild interlobular septal thickening at the lung bases. Severe central peribronchial thickening. Linear scarring/atelectasis in the posterior right upper lobe. Dependent subsegmental atelectasis in the posterior right middle lobe and both lower lobes. Calcified granuloma in the lingula. No pneumothorax. Upper Abdomen: No acute abnormality. Reflux of contrast into the IVC and hepatic veins. Musculoskeletal: No chest wall abnormality. No acute or significant osseous findings. Review of the MIP images confirms the above findings. IMPRESSION: 1. No evidence of pulmonary embolism. 2. Findings consistent with congestive heart failure with small right greater than left pleural effusions and mild interstitial pulmonary edema. Evidence of right heart dysfunction. 3. Severe central peribronchial thickening, consistent with  infectious or inflammatory bronchitis. 4. New ascending thoracic aortic aneurysm measuring up to 4.0 cm. Consider annual imaging followup by CTA or MRA as clinically indicated given the patient's age. This recommendation follows 2010 ACCF/AHA/AATS/ACR/ASA/SCA/SCAI/SIR/STS/SVM Guidelines for the Diagnosis and Management of Patients with Thoracic Aortic Disease. Circulation. 2010; 121: J242-A834. Aortic aneurysm NOS (ICD10-I71.9) 5. Aortic Atherosclerosis (ICD10-I70.0). Electronically Signed   By: Titus Dubin M.D.   On: 11/12/2019 20:47   DG Chest Portable 1 View  Result Date:  11/12/2019 CLINICAL DATA:  Shortness of breath EXAM: PORTABLE CHEST 1 VIEW COMPARISON:  June 26, 2019 FINDINGS: The lungs are clear. Heart is mildly enlarged with pulmonary vascularity normal. Postoperative changes from ovary artery bypass grafting noted. Adenopathy. No bone lesions. IMPRESSION: Stable cardiac prominence.  Postoperative change.  Lungs clear. Electronically Signed   By: Lowella Grip III M.D.   On: 11/12/2019 17:03   ECHOCARDIOGRAM COMPLETE  Result Date: 11/13/2019    ECHOCARDIOGRAM REPORT   Patient Name:   Leonard [name] Date of Exam: 11/13/2019 Medical Rec #:  [id number]       Height:       70.0 in Accession #:    [id number]      Weight:       146.0 lb Date of Birth:  12-09-1920        BSA:          1.826 m Patient Age:    86 years        BP:           149/81 mmHg Patient Gender: M               HR:           78 bpm. Exam Location:  Inpatient Procedure: 2D Echo, Color Doppler and Cardiac Doppler Indications:    I50.9* Heart failure (unspecified)  History:        Patient has prior history of Echocardiogram examinations, most                 recent 03/12/2017. CHF; Risk Factors:Hypertension, Diabetes and                 Dyslipidemia. COVID+ on 06/26/19.  Sonographer:    Raquel Sarna Senior RDCS Referring Phys: [place]  1. Left ventricular ejection fraction, by estimation, is 40 to 45%. The left ventricle has mildly decreased function. There is mild left ventricular hypertrophy. Left ventricular diastolic parameters are indeterminate.  2. Right ventricular systolic function is moderately reduced. The right ventricular size is normal. Tricuspid regurgitation signal is inadequate for assessing PA pressure.  3. Left atrial size was moderately dilated.  4. Right atrial size was mildly dilated.  5. The mitral valve is degenerative. Mild mitral valve regurgitation.  6. The aortic valve was not well visualized. Aortic valve regurgitation is not visualized. Mild aortic valve  sclerosis is present, with no evidence of aortic valve stenosis.  7. Aortic dilatation noted. There is dilatation of the ascending aorta measuring 39 mm.  8. The inferior vena cava is dilated in size with >50% respiratory variability, suggesting right atrial pressure of 8 mmHg. FINDINGS  Left Ventricle: Left ventricular ejection fraction, by estimation, is 40 to 45%. The left ventricle has mildly decreased function. The left ventricle demonstrates global hypokinesis. The left ventricular internal cavity size was normal in size. There is  mild left ventricular hypertrophy. Left ventricular diastolic parameters are indeterminate. Right Ventricle: The right ventricular size is normal. Right vetricular  wall thickness was not assessed. Right ventricular systolic function is moderately reduced. Tricuspid regurgitation signal is inadequate for assessing PA pressure. Left Atrium: Left atrial size was moderately dilated. Right Atrium: Right atrial size was mildly dilated. Pericardium: There is no evidence of pericardial effusion. Mitral Valve: The mitral valve is degenerative in appearance. Moderate mitral annular calcification. Mild mitral valve regurgitation. Tricuspid Valve: The tricuspid valve is normal in structure. Tricuspid valve regurgitation is not demonstrated. Aortic Valve: The aortic valve was not well visualized. Aortic valve regurgitation is not visualized. Mild aortic valve sclerosis is present, with no evidence of aortic valve stenosis. Pulmonic Valve: The pulmonic valve was not well visualized. Pulmonic valve regurgitation is not visualized. Aorta: Aortic dilatation noted. There is dilatation of the ascending aorta measuring 39 mm. Venous: The inferior vena cava is dilated in size with greater than 50% respiratory variability, suggesting right atrial pressure of 8 mmHg. IAS/Shunts: The interatrial septum was not well visualized.  LEFT VENTRICLE PLAX 2D LVIDd:         5.00 cm LVIDs:         4.10 cm LV PW:          1.10 cm LV IVS:        1.20 cm LVOT diam:     1.80 cm LV SV:         39 LV SV Index:   22 LVOT Area:     2.54 cm  LV Volumes (MOD) LV vol d, MOD A2C: 85.6 ml LV vol d, MOD A4C: 164.0 ml LV vol s, MOD A2C: 50.7 ml LV vol s, MOD A4C: 91.4 ml LV SV MOD A2C:     34.9 ml LV SV MOD A4C:     164.0 ml LV SV MOD BP:      52.0 ml RIGHT VENTRICLE RV S prime:     5.77 cm/s TAPSE (M-mode): 1.0 cm LEFT ATRIUM              Index       RIGHT ATRIUM           Index LA diam:        4.10 cm  2.25 cm/m  RA Area:     20.60 cm LA Vol (A2C):   118.0 ml 64.62 ml/m RA Volume:   58.20 ml  31.87 ml/m LA Vol (A4C):   58.7 ml  32.15 ml/m LA Biplane Vol: 88.6 ml  48.52 ml/m  AORTIC VALVE LVOT Vmax:   101.00 cm/s LVOT Vmean:  65.100 cm/s LVOT VTI:    0.155 m  AORTA Ao Root diam: 3.10 cm Ao Asc diam:  3.90 cm  SHUNTS Systemic VTI:  0.16 m Systemic Diam: 1.80 cm Oswaldo Milian MD Electronically signed by Oswaldo Milian MD Signature Date/Time: 11/13/2019/3:53:36 PM    Final       Subjective: Has no complaints.   Discharge Exam: Vitals:   11/19/19 2018 11/20/19 0537  BP: (!) 158/82 (!) 138/93  Pulse: 68 60  Resp: 18 16  Temp: 97.9 F (36.6 C) 97.7 F (36.5 C)  SpO2: 96% 99%   Vitals:   11/19/19 0405 11/19/19 0821 11/19/19 2018 11/20/19 0537  BP:  135/81 (!) 158/82 (!) 138/93  Pulse:  72 68 60  Resp:  18 18 16   Temp:  98 F (36.7 C) 97.9 F (36.6 C) 97.7 F (36.5 C)  TempSrc:  Oral Oral Oral  SpO2:  98% 96% 99%  Weight: 61.2 kg   58.1 kg  Height:        General: Pt is alert, awake, not in acute distress Cardiovascular: RRR, S1/S2 +, no rubs, no gallops Respiratory: CTA bilaterally, no wheezing, no rhonchi Abdominal: Soft, NT, ND, bowel sounds + Extremities: no edema, no cyanosis    The results of significant diagnostics from this hospitalization (including imaging, microbiology, ancillary and laboratory) are listed below for reference.     Microbiology: Recent Results (from the past  240 hour(s))  SARS Coronavirus 2 by RT PCR (hospital order, performed in [facility] hospital lab) Nasopharyngeal Nasopharyngeal Swab     Status: None   Collection Time: 11/12/19  5:31 PM   Specimen: Nasopharyngeal Swab  Result Value Ref Range Status   SARS Coronavirus 2 NEGATIVE NEGATIVE Final    Comment: (NOTE) SARS-CoV-2 target nucleic acids are NOT DETECTED. The SARS-CoV-2 RNA is generally detectable in upper and lower respiratory specimens during the acute phase of infection. The lowest concentration of SARS-CoV-2 viral copies this assay can detect is 250 copies / mL. A negative result does not preclude SARS-CoV-2 infection and should not be used as the sole basis for treatment or other patient management decisions.  A negative result may occur with improper specimen collection / handling, submission of specimen other than nasopharyngeal swab, presence of viral mutation(s) within the areas targeted by this assay, and inadequate number of viral copies (<250 copies / mL). A negative result must be combined with clinical observations, patient history, and epidemiological information. Fact Sheet for Patients:   StrictlyIdeas.no Fact Sheet for Healthcare Providers: BankingDealers.co.za This test is not yet approved or cleared  by the [place] FDA and has been authorized for detection and/or diagnosis of SARS-CoV-2 by FDA under an Emergency Use Authorization (EUA).  This EUA will remain in effect (meaning this test can be used) for the duration of the COVID-19 declaration under Section 564(b)(1) of the Act, 21 U.S.C. section 360bbb-3(b)(1), unless the authorization is terminated or revoked sooner. Performed at [facility], [place] [address], [place], [place] [address]   MRSA PCR Screening     Status: None   Collection Time: 11/16/19  1:16 AM   Specimen: Nasal Mucosa; Nasopharyngeal  Result Value Ref Range Status   MRSA by PCR  NEGATIVE NEGATIVE Final    Comment:        The GeneXpert MRSA Assay (FDA approved for NASAL specimens only), is one component of a comprehensive MRSA colonization surveillance program. It is not intended to diagnose MRSA infection nor to guide or monitor treatment for MRSA infections. Performed at [facility], [place] [address], [place], [place] [address]   SARS CORONAVIRUS 2 (TAT 6-24 HRS) Nasopharyngeal Nasopharyngeal Swab     Status: None   Collection Time: 11/16/19  6:05 PM   Specimen: Nasopharyngeal Swab  Result Value Ref Range Status   SARS Coronavirus 2 NEGATIVE NEGATIVE Final    Comment: (NOTE) SARS-CoV-2 target nucleic acids are NOT DETECTED.  The SARS-CoV-2 RNA is generally detectable in upper and lower respiratory specimens during the acute phase of infection. Negative results do not preclude SARS-CoV-2 infection, do not rule out co-infections with other pathogens, and should not be used as the sole basis for treatment or other patient management decisions. Negative results must be combined with clinical observations, patient history, and epidemiological information. The expected result is Negative.  Fact Sheet for Patients: SugarRoll.be  Fact Sheet for Healthcare Providers: https://www.woods-mathews.com/  This test is not yet approved or cleared by the [place]  States FDA and  has been authorized for detection and/or diagnosis of SARS-CoV-2 by FDA under an Emergency Use Authorization (EUA). This EUA will remain  in effect (meaning this test can be used) for the duration of the COVID-19 declaration under Se ction 564(b)(1) of the Act, 21 U.S.C. section 360bbb-3(b)(1), unless the authorization is terminated or revoked sooner.  Performed at [facility], [place] [address] [place], [place] [address]      Labs: BNP (last 3 results) Recent Labs    06/30/19 0621 11/12/19 1630 11/14/19 1032  BNP 165.6* 447.9*  952.8*   Basic Metabolic Panel: Recent Labs  Lab 11/14/19 0536 11/15/19 0758 11/15/19 0941 11/16/19 0943 11/17/19 0442 11/20/19 0506  NA 138 138 136  --  137  --   K 4.5 3.3* 3.3* 3.9 3.7  --   CL 103 101 100  --  105  --   CO2 23 23 21*  --  21*  --   GLUCOSE 160* 79 133*  --  136*  --   BUN 17 19 18   --  25*  --   CREATININE 1.42* 1.53* 1.41*  --  1.42* 1.30*  CALCIUM 9.0 9.1 9.1  --  8.8*  --    Liver Function Tests: No results for input(s): AST, ALT, ALKPHOS, BILITOT, PROT, ALBUMIN in the last 168 hours. No results for input(s): LIPASE, AMYLASE in the last 168 hours. No results for input(s): AMMONIA in the last 168 hours. CBC: Recent Labs  Lab 11/15/19 0941  WBC 5.3  HGB 14.7  HCT 43.5  MCV 84.8  PLT 153   Cardiac Enzymes: No results for input(s): CKTOTAL, CKMB, CKMBINDEX, TROPONINI in the last 168 hours. BNP: Invalid input(s): POCBNP CBG: Recent Labs  Lab 11/19/19 0553 11/19/19 1243 11/19/19 2107 11/20/19 0539 11/20/19 1102  GLUCAP 141* 191* 241* 137* 193*   D-Dimer No results for input(s): DDIMER in the last 72 hours. Hgb A1c No results for input(s): HGBA1C in the last 72 hours. Lipid Profile No results for input(s): CHOL, HDL, LDLCALC, TRIG, CHOLHDL, LDLDIRECT in the last 72 hours. Thyroid function studies No results for input(s): TSH, T4TOTAL, T3FREE, THYROIDAB in the last 72 hours.  Invalid input(s): FREET3 Anemia work up No results for input(s): VITAMINB12, FOLATE, FERRITIN, TIBC, IRON, RETICCTPCT in the last 72 hours. Urinalysis    Component Value Date/Time   COLORURINE YELLOW 05/22/2017 1405   APPEARANCEUR HAZY (A) 05/22/2017 1405   LABSPEC 1.021 05/22/2017 1405   PHURINE 6.0 05/22/2017 1405   GLUCOSEU 50 (A) 05/22/2017 1405   HGBUR SMALL (A) 05/22/2017 1405   HGBUR negative 02/28/2008 0751   BILIRUBINUR NEGATIVE 05/22/2017 1405   KETONESUR 5 (A) 05/22/2017 1405   PROTEINUR 100 (A) 05/22/2017 1405   UROBILINOGEN 0.2 07/10/2014 0039    NITRITE NEGATIVE 05/22/2017 1405   LEUKOCYTESUR NEGATIVE 05/22/2017 1405   Sepsis Labs Invalid input(s): PROCALCITONIN,  WBC,  LACTICIDVEN Microbiology Recent Results (from the past 240 hour(s))  SARS Coronavirus 2 by RT PCR (hospital order, performed in [facility] hospital lab) Nasopharyngeal Nasopharyngeal Swab     Status: None   Collection Time: 11/12/19  5:31 PM   Specimen: Nasopharyngeal Swab  Result Value Ref Range Status   SARS Coronavirus 2 NEGATIVE NEGATIVE Final    Comment: (NOTE) SARS-CoV-2 target nucleic acids are NOT DETECTED. The SARS-CoV-2 RNA is generally detectable in upper and lower respiratory specimens during the acute phase of infection. The lowest concentration of SARS-CoV-2 viral copies this assay  can detect is 250 copies / mL. A negative result does not preclude SARS-CoV-2 infection and should not be used as the sole basis for treatment or other patient management decisions.  A negative result may occur with improper specimen collection / handling, submission of specimen other than nasopharyngeal swab, presence of viral mutation(s) within the areas targeted by this assay, and inadequate number of viral copies (<250 copies / mL). A negative result must be combined with clinical observations, patient history, and epidemiological information. Fact Sheet for Patients:   StrictlyIdeas.no Fact Sheet for Healthcare Providers: BankingDealers.co.za This test is not yet approved or cleared  by the [place] FDA and has been authorized for detection and/or diagnosis of SARS-CoV-2 by FDA under an Emergency Use Authorization (EUA).  This EUA will remain in effect (meaning this test can be used) for the duration of the COVID-19 declaration under Section 564(b)(1) of the Act, 21 U.S.C. section 360bbb-3(b)(1), unless the authorization is terminated or revoked sooner. Performed at [facility], [place] [address], [place], [place] [address]   MRSA PCR Screening     Status: None   Collection Time: 11/16/19  1:16 AM   Specimen: Nasal Mucosa; Nasopharyngeal  Result Value Ref Range Status   MRSA by PCR NEGATIVE NEGATIVE Final    Comment:        The GeneXpert MRSA Assay (FDA approved for NASAL specimens only), is one component of a comprehensive MRSA colonization surveillance program. It is not intended to diagnose MRSA infection nor to guide or monitor treatment for MRSA infections. Performed at [facility], [place] [address], [place], [place] [address]   SARS CORONAVIRUS 2 (TAT 6-24 HRS) Nasopharyngeal Nasopharyngeal Swab     Status: None   Collection Time: 11/16/19  6:05 PM   Specimen: Nasopharyngeal Swab  Result Value Ref Range Status   SARS Coronavirus 2 NEGATIVE NEGATIVE Final    Comment: (NOTE) SARS-CoV-2 target nucleic acids are NOT DETECTED.  The SARS-CoV-2 RNA is generally detectable in upper and lower respiratory specimens during the acute phase of infection. Negative results do not preclude SARS-CoV-2 infection, do not rule out co-infections with other pathogens, and should not be used as the sole basis for treatment or other patient management decisions. Negative results must be combined with clinical observations, patient history, and epidemiological information. The expected result is Negative.  Fact Sheet for Patients: SugarRoll.be  Fact Sheet for Healthcare Providers: https://www.woods-mathews.com/  This test is not yet approved or cleared by the [place] FDA and  has been authorized for detection and/or diagnosis of SARS-CoV-2 by FDA under an Emergency Use Authorization (EUA). This EUA will remain  in effect (meaning this test can be used) for the duration of the COVID-19 declaration under Se ction 564(b)(1) of the Act, 21 U.S.C. section 360bbb-3(b)(1), unless the authorization is terminated or revoked  sooner.  Performed at [facility], [place] [address] [place], [place] [address]      Time coordinating discharge: Over 30 minutes  SIGNED:   Nolberto Hanlon, MD  [facility] 11/20/2019, 1:14 PM Pager   If 7PM-7AM, please contact night-coverage [email or web address] Password [email or web address]

## 2019-11-15 NOTE — Progress Notes (Signed)
Pt continues with confusion and agitation. Pt violent towards staff, attempting to strike staff with hands, feet, and his call bell.. Pt refusing to lay down in bed or sit down in chair, although he is tired and a high fall risk. MD paged.

## 2019-11-16 DIAGNOSIS — I5021 Acute systolic (congestive) heart failure: Secondary | ICD-10-CM

## 2019-11-16 LAB — GLUCOSE, CAPILLARY
Glucose-Capillary: 137 mg/dL — ABNORMAL HIGH (ref 70–99)
Glucose-Capillary: 198 mg/dL — ABNORMAL HIGH (ref 70–99)
Glucose-Capillary: 150 mg/dL — ABNORMAL HIGH (ref 70–99)
Glucose-Capillary: 220 mg/dL — ABNORMAL HIGH (ref 70–99)

## 2019-11-16 LAB — POTASSIUM: Potassium: 3.9 mmol/L (ref 3.5–5.1)

## 2019-11-16 LAB — MRSA PCR SCREENING: MRSA by PCR: NEGATIVE

## 2019-11-16 MED ORDER — POTASSIUM CHLORIDE CRYS ER 20 MEQ PO TBCR
20.0000 meq | EXTENDED_RELEASE_TABLET | Freq: Every day | ORAL | Status: DC
  Administered 2019-11-17 – 2019-11-20 (×4): 20 meq via ORAL
  Filled 2019-11-16 (×4): qty 1

## 2019-11-16 NOTE — Progress Notes (Signed)
Pt ambulated in the hallway placed back in the chair with chair alarm set. Grandson at the bedside.

## 2019-11-16 NOTE — Progress Notes (Signed)
Pt taken out of restraints ate breakfast without any behavioral issues after breakfast walked in the hallway with mobility specialist. Returned to room and placed in chair with chair alarm on.

## 2019-11-16 NOTE — Progress Notes (Signed)
PROGRESS NOTE    Leonard [name]  OIN:[id number] DOB: June 18, 1920 DOA: 11/12/2019 PCP: Hoyt Koch, MD    Brief Narrative:  Patient 86 year old PMH CAD, ischemic cardiomyopathy EF 45--50 % who presents with SOB and confusion found to have Heart failure exacerbation and hypoxemia.     Consultants:   Cardiology  Procedures:   Antimicrobials:       Subjective: Sitting in the chair today.  Is only oriented to person.  Calm.  Overnight issues noted  Objective: Vitals:   11/16/19 0000 11/16/19 0430 11/16/19 0741 11/16/19 1156  BP:  (!) 161/81 (!) 168/90 115/71  Pulse:  70 77 72  Resp:  19 18 18   Temp: 97.6 F (36.4 C) (!) 97.2 F (36.2 C) (!) 97.4 F (36.3 C) (!) 97.5 F (36.4 C)  TempSrc: Oral Oral Axillary Axillary  SpO2: 100% 100% 99% 97%  Weight:  61.2 kg    Height:        Intake/Output Summary (Last 24 hours) at 11/16/2019 1525 Last data filed at 11/16/2019 1300 Gross per 24 hour  Intake 340 ml  Output 800 ml  Net -460 ml   Filed Weights   11/14/19 0954 11/15/19 0548 11/16/19 0430  Weight: 59.4 kg 62.1 kg 61.2 kg    Examination:  General exam: Appears calm and comfortable in chair Respiratory system: Clear to auscultation. Respiratory effort normal.no rhonchi or rales Upper airway wheezing.  Cardiovascular system: S1 & S2 heard, RRR. No JVD, murmurs, rubs, gallops or clicks.  Gastrointestinal system: Abdomen is nondistended, soft and nontender.Normal bowel sounds heard. Central nervous system: Alert and oriented to person only not to place or date.  ~ Extremitie: no edema Skin: warm, dry Psychiatry: Mood & affect appropriate in current setting.     Data Reviewed: I have personally reviewed following labs and imaging studies  CBC: Recent Labs  Lab 11/12/19 1622 11/13/19 0804 11/15/19 0941  WBC 3.7* 4.2 5.3  NEUTROABS 1.3* 2.0  --   HGB 12.8* 13.9 14.7  HCT 39.1 42.7 43.5  MCV 87.7 87.3 84.8  PLT 101* 149* 709   Basic Metabolic  Panel: Recent Labs  Lab 11/12/19 1622 11/12/19 1622 11/13/19 0804 11/14/19 0536 11/15/19 0758 11/15/19 0941 11/16/19 0943  NA 135  --  139 138 138 136  --   K 4.7   < > 3.7 4.5 3.3* 3.3* 3.9  CL 106  --  101 103 101 100  --   CO2 19*  --  23 23 23  21*  --   GLUCOSE 155*  --  139* 160* 79 133*  --   BUN 18  --  15 17 19 18   --   CREATININE 1.33*  --  1.31*   1.31* 1.42* 1.53* 1.41*  --   CALCIUM 8.2*  --  8.9 9.0 9.1 9.1  --   MG  --   --  1.8  --   --   --   --    < > = values in this interval not displayed.   GFR: Estimated Creatinine Clearance: 25.1 mL/min (A) (by C-G formula based on SCr of 1.41 mg/dL (H)). Liver Function Tests: Recent Labs  Lab 11/12/19 1622 11/13/19 0804  AST 25 17  ALT 15 20  ALKPHOS 102 115  BILITOT 1.2 0.7  PROT 5.9* 6.8  ALBUMIN 3.3* 3.5   No results for input(s): LIPASE, AMYLASE in the last 168 hours. Recent Labs  Lab 11/13/19 0804  AMMONIA 18  Coagulation Profile: No results for input(s): INR, PROTIME in the last 168 hours. Cardiac Enzymes: No results for input(s): CKTOTAL, CKMB, CKMBINDEX, TROPONINI in the last 168 hours. BNP (last 3 results) No results for input(s): PROBNP in the last 8760 hours. HbA1C: No results for input(s): HGBA1C in the last 72 hours. CBG: Recent Labs  Lab 11/15/19 1321 11/15/19 1703 11/15/19 2113 11/16/19 0612 11/16/19 1155  GLUCAP 136* 144* 160* 137* 198*   Lipid Profile: No results for input(s): CHOL, HDL, LDLCALC, TRIG, CHOLHDL, LDLDIRECT in the last 72 hours. Thyroid Function Tests: No results for input(s): TSH, T4TOTAL, FREET4, T3FREE, THYROIDAB in the last 72 hours. Anemia Panel: No results for input(s): VITAMINB12, FOLATE, FERRITIN, TIBC, IRON, RETICCTPCT in the last 72 hours. Sepsis Labs: No results for input(s): PROCALCITON, LATICACIDVEN in the last 168 hours.  Recent Results (from the past 240 hour(s))  SARS Coronavirus 2 by RT PCR (hospital order, performed in [facility] hospital  lab) Nasopharyngeal Nasopharyngeal Swab     Status: None   Collection Time: 11/12/19  5:31 PM   Specimen: Nasopharyngeal Swab  Result Value Ref Range Status   SARS Coronavirus 2 NEGATIVE NEGATIVE Final    Comment: (NOTE) SARS-CoV-2 target nucleic acids are NOT DETECTED. The SARS-CoV-2 RNA is generally detectable in upper and lower respiratory specimens during the acute phase of infection. The lowest concentration of SARS-CoV-2 viral copies this assay can detect is 250 copies / mL. A negative result does not preclude SARS-CoV-2 infection and should not be used as the sole basis for treatment or other patient management decisions.  A negative result may occur with improper specimen collection / handling, submission of specimen other than nasopharyngeal swab, presence of viral mutation(s) within the areas targeted by this assay, and inadequate number of viral copies (<250 copies / mL). A negative result must be combined with clinical observations, patient history, and epidemiological information. Fact Sheet for Patients:   StrictlyIdeas.no Fact Sheet for Healthcare Providers: BankingDealers.co.za This test is not yet approved or cleared  by the [place] FDA and has been authorized for detection and/or diagnosis of SARS-CoV-2 by FDA under an Emergency Use Authorization (EUA).  This EUA will remain in effect (meaning this test can be used) for the duration of the COVID-19 declaration under Section 564(b)(1) of the Act, 21 U.S.C. section 360bbb-3(b)(1), unless the authorization is terminated or revoked sooner. Performed at [facility], [place] [address], [place], [place] [address]   MRSA PCR Screening     Status: None   Collection Time: 11/16/19  1:16 AM   Specimen: Nasal Mucosa; Nasopharyngeal  Result Value Ref Range Status   MRSA by PCR NEGATIVE NEGATIVE Final    Comment:        The GeneXpert MRSA Assay (FDA approved for NASAL  specimens only), is one component of a comprehensive MRSA colonization surveillance program. It is not intended to diagnose MRSA infection nor to guide or monitor treatment for MRSA infections. Performed at [facility], [place] [address], [place], [place] [address]          Radiology Studies: No results found.      Scheduled Meds:  amLODipine  5 mg Oral BID   aspirin  81 mg Oral Daily   enoxaparin (LOVENOX) injection  30 mg Subcutaneous QHS   feeding supplement (ENSURE ENLIVE)  237 mL Oral QHS   furosemide  40 mg Oral Daily   insulin aspart  0-9 Units Subcutaneous TID WC   insulin  glargine  9 Units Subcutaneous Daily   isosorbide mononitrate  30 mg Oral Daily   latanoprost  1 drop Both Eyes QHS   losartan  100 mg Oral Daily   mirabegron ER  25 mg Oral Daily   multivitamin with minerals  1 tablet Oral Daily   [START ON 11/17/2019] potassium chloride  20 mEq Oral Daily   tamsulosin  0.4 mg Oral Daily   vitamin B-12  1,000 mcg Oral Daily   Continuous Infusions:  Assessment & Plan:   Principal Problem:   Acute CHF (congestive heart failure) (HCC) Active Problems:   Uncontrolled type 2 diabetes with renal manifestation (HCC)   Essential hypertension   Chronic renal insufficiency, stage III (moderate)   CHF (congestive heart failure) (HCC)   Complete heart block (HCC)   Acute encephalopathy   Malnutrition of moderate degree  1-Acute on chronic systolic heart failure exacerbation;  CT with pulmonary edema, Elevated BNP Cards following.  Lasix switch from iv to po. bnp less but still elevated from baseline Upper airway wheezing mildly, will monitor, if need to will give dose of iv steroid.  2-Complete Heart Block Cardiology has been consulted.  Avoid AV block agents.  Asymptomatic.   3-Pancytopenia; WBC normalized. Platelet mildly low.  Suspect related to B 12 deficiency./ iron deficiency Started B 12 supplement. Iron   Diabetes type  2; Continue with SSI.  Continue with lantus.   Acute Metabolic encephalopathy;  Suspect multifactorial., related to acute illness, Hypoxemia, B 12 deficiency.  Support care.  Start B-12 supplement.  Hold gabapentin for now.   HTN;  Continue amlodipine, losartan, Lasix switched to p.o. daily Started on Imdur from isordil. If bp elevated,start with lowest dose hydralazine 10mg  bid  CKD stage III; stable.    CAD, S/P CABG. Continue with aspirin. see above. Ax  Sundowning/delirium- was given on seroquel, but son in law thought this made it worse. Thus discontinued, but still sundowns.      DVT prophylaxis: Lovenox.  Code Status: DNR Family Communication: daughter  Disposition Plan:  Status is: Inpatient  Remains inpatient appropriate because:Hemodynamically unstable   Dispo: The patient is from: SNF  Anticipated d/c is to: SNF  Anticipated d/c date is: awaiting authorization for SNF, needs to be off               restraints for 24 hrs.        LOS: 4 days   Time spent: 45 min with >50% on coc    Nolberto Hanlon, MD [facility] Pager [phone]  If 7PM-7AM, please contact night-coverage [email or web address] Password [email or web address] 11/16/2019, 3:25 PM   Patient ID: Leonard [name], male   DOB: April 21, 1921, 86 y.o.   MRN: [id number]

## 2019-11-16 NOTE — Progress Notes (Signed)
Progress Note  Patient Name: Leonard [name] Date of Encounter: 11/16/2019  [facility] Cardiologist:  Subjective   Pt sitting in chair    Comfortable   No CP  Breathig appears OK    Inpatient Medications    Scheduled Meds: . amLODipine  5 mg Oral BID  . aspirin  81 mg Oral Daily  . enoxaparin (LOVENOX) injection  30 mg Subcutaneous QHS  . feeding supplement (ENSURE ENLIVE)  237 mL Oral QHS  . furosemide  40 mg Oral Daily  . insulin aspart  0-9 Units Subcutaneous TID WC  . insulin glargine  9 Units Subcutaneous Daily  . isosorbide mononitrate  30 mg Oral Daily  . latanoprost  1 drop Both Eyes QHS  . losartan  100 mg Oral Daily  . mirabegron ER  25 mg Oral Daily  . multivitamin with minerals  1 tablet Oral Daily  . potassium chloride  10 mEq Oral Daily  . tamsulosin  0.4 mg Oral Daily  . vitamin B-12  1,000 mcg Oral Daily   Continuous Infusions:  PRN Meds:    Vital Signs    Vitals:   11/15/19 2042 11/15/19 2303 11/16/19 0000 11/16/19 0430  BP: (!) 175/97 (!) 146/78  (!) 161/81  Pulse: 88 78  70  Resp: 18 20  19   Temp: 97.8 F (36.6 C)  97.6 F (36.4 C) (!) 97.2 F (36.2 C)  TempSrc:   Oral Oral  SpO2: 95% 99% 100% 100%  Weight:    61.2 kg  Height:        Intake/Output Summary (Last 24 hours) at 11/16/2019 0713 Last data filed at 11/16/2019 0400 Gross per 24 hour  Intake 280 ml  Output 1200 ml  Net -920 ml   Net neg 2.7 L     Last 3 Weights 11/16/2019 11/15/2019 11/14/2019  Weight (lbs) 135 lb 137 lb 130 lb 14.4 oz  Weight (kg) 61.236 kg 62.143 kg 59.376 kg      Telemetry    SR  No signif bradycardia   - Personally Reviewed  ECG    -None Personally Reviewed  Physical Exam   GEN: No acute distress.   Neck: No JVD Cardiac: RRR, no murmurs Respiratory: Clear to auscultation bilaterally GI: Soft, nontender, non-distended  MS: No edema; No deformity. Neuro:  Nonfocal  Psych: Normal affect   Labs    High Sensitivity Troponin:   Recent  Labs  Lab 11/12/19 1622 11/12/19 1816 11/13/19 0804 11/13/19 1218  TROPONINIHS 38* 49* 47* 49*      Chemistry Recent Labs  Lab 11/12/19 1622 11/12/19 1622 11/13/19 0804 11/13/19 0804 11/14/19 0536 11/15/19 0758 11/15/19 0941  NA 135   < > 139   < > 138 138 136  K 4.7   < > 3.7   < > 4.5 3.3* 3.3*  CL 106   < > 101   < > 103 101 100  CO2 19*   < > 23   < > 23 23 21*  GLUCOSE 155*   < > 139*   < > 160* 79 133*  BUN 18   < > 15   < > 17 19 18   CREATININE 1.33*   < > 1.31*  1.31*   < > 1.42* 1.53* 1.41*  CALCIUM 8.2*   < > 8.9   < > 9.0 9.1 9.1  PROT 5.9*  --  6.8  --   --   --   --  ALBUMIN 3.3*  --  3.5  --   --   --   --   AST 25  --  17  --   --   --   --   ALT 15  --  20  --   --   --   --   ALKPHOS 102  --  115  --   --   --   --   BILITOT 1.2  --  0.7  --   --   --   --   GFRNONAA 44*   < > 45*  45*   < > 41* 38* 41*  GFRAA 52*   < > 53*  53*   < > 48* 44* 48*  ANIONGAP 10   < > 15   < > 12 14 15    < > = values in this interval not displayed.     Hematology Recent Labs  Lab 11/12/19 1622 11/13/19 0804 11/15/19 0941  WBC 3.7* 4.2 5.3  RBC 4.46 4.89  4.87 5.13  HGB 12.8* 13.9 14.7  HCT 39.1 42.7 43.5  MCV 87.7 87.3 84.8  MCH 28.7 28.4 28.7  MCHC 32.7 32.6 33.8  RDW 14.9 15.0 14.7  PLT 101* 149* 153    BNP Recent Labs  Lab 11/12/19 1630 11/14/19 1032  BNP 447.9* 311.7*     DDimer  Recent Labs  Lab 11/12/19 1745  DDIMER 1.67*     Radiology    No results found.  Cardiac Studies     Patient Profile     86 y.o. male hx of CAD, CHF   Admitted with  SOB  Noted to have conduction dz.  Assessment & Plan   1  Rhythm   HR has remained steady  No signif bradycardia   No pauses    No intervention needed   2  Acute on chronic systolic CHF   Volume looks OK   Cr stable 1.41  Keep on current regimen   Will need KCL supplement increaesed t o20   Follow      3  HTN  BP is up so far today but I am very hesitant to change meds   Concern for  hypotension   I would follow    IF it remains hig could try very low dos hydralazine BID 10 bid possibly   As an outpt  4  CAD  No symptoms of angina   5  Psych Pt calm today   I chatted with him   Still slow   For questions or updates, please contact [facility] Please consult [email or web address] for contact info under        Signed, Dorris Carnes, MD  11/16/2019, 7:13 AM

## 2019-11-16 NOTE — TOC Progression Note (Signed)
Transition of Care [facility]) - Progression Note    Patient Details  Name: Leonard [name] MRN: [id number] Date of Birth: 1921/01/14  [facility] CM/SW [place], [place] Phone Number: 11/16/2019, 12:54 PM  Clinical Narrative:    CSW followed up with patient's daughter Neoma Laming to provide bed offers. Neoma Laming will review offers and requested follow-up from CSW to provide bed choice. Daughter is aware patient needs to be out of restraints 24 hours before discharging to SNF. Covid test is needed prior to discharge.    Expected Discharge Plan: [place] Barriers to Discharge: Continued Medical Work up, [place] (PASRR)  Expected Discharge Plan and Services Expected Discharge Plan: [place] arrangements for the past 2 months: [place] Expected Discharge Date: 11/15/19                                     Social Determinants of Health (SDOH) Interventions    Readmission Risk Interventions No flowsheet data found.

## 2019-11-16 NOTE — Care Management Important Message (Signed)
Important Message  Patient Details  Name: Leonard [name] MRN: [id number] Date of Birth: 02/19/1921   Medicare Important Message Given:  Yes     Shelda Altes 11/16/2019, 8:43 AM

## 2019-11-16 NOTE — TOC Progression Note (Addendum)
Transition of Care [facility]) - Progression Note    Patient Details  Name: Leonard [name] MRN: [id number] Date of Birth: 10-Mar-1921  Transition of Care [facility]) CM/SW [place], [place] Phone Number: 11/16/2019, 5:48 PM  Clinical Narrative:     Patient's daughter left voice message- preferred SNF is [facility] or [facility].   RN- informed covid test needed  Thurmond Butts, MSW, [place] Clinical Social Worker   Expected Discharge Plan: Skilled Nursing Facility Barriers to Discharge: Continued Medical Work up, Environmental education officer)  Expected Discharge Plan and Services Expected Discharge Plan: [place] arrangements for the past 2 months: [place] Expected Discharge Date: 11/15/19                                     Social Determinants of Health (SDOH) Interventions    Readmission Risk Interventions No flowsheet data found.

## 2019-11-16 NOTE — Plan of Care (Signed)
  Problem: Pain Managment: Goal: General experience of comfort will improve Outcome: Completed/Met   Problem: Skin Integrity: Goal: Risk for impaired skin integrity will decrease Outcome: Completed/Met

## 2019-11-16 NOTE — Progress Notes (Signed)
Pt's was very combative and verbally abusive with staff, and pulled out IV, MD ordered restraints again and IM Ativan 1 mg to help with this, will continue to monitor, Thanks Arvella Nigh RN.

## 2019-11-17 LAB — BASIC METABOLIC PANEL
Anion gap: 11 (ref 5–15)
BUN: 25 mg/dL — ABNORMAL HIGH (ref 8–23)
CO2: 21 mmol/L — ABNORMAL LOW (ref 22–32)
Calcium: 8.8 mg/dL — ABNORMAL LOW (ref 8.9–10.3)
Chloride: 105 mmol/L (ref 98–111)
Creatinine, Ser: 1.42 mg/dL — ABNORMAL HIGH (ref 0.61–1.24)
GFR calc Af Amer: 48 mL/min — ABNORMAL LOW (ref >60)
GFR calc non Af Amer: 41 mL/min — ABNORMAL LOW (ref >60)
Glucose, Bld: 136 mg/dL — ABNORMAL HIGH (ref 70–99)
Potassium: 3.7 mmol/L (ref 3.5–5.1)
Sodium: 137 mmol/L (ref 135–145)

## 2019-11-17 LAB — SARS CORONAVIRUS 2 (TAT 6-24 HRS): SARS Coronavirus 2: NEGATIVE

## 2019-11-17 LAB — GLUCOSE, CAPILLARY
Glucose-Capillary: 121 mg/dL — ABNORMAL HIGH (ref 70–99)
Glucose-Capillary: 203 mg/dL — ABNORMAL HIGH (ref 70–99)
Glucose-Capillary: 140 mg/dL — ABNORMAL HIGH (ref 70–99)
Glucose-Capillary: 182 mg/dL — ABNORMAL HIGH (ref 70–99)

## 2019-11-17 NOTE — Plan of Care (Signed)
  Problem: Activity: Goal: Risk for activity intolerance will decrease Outcome: Progressing   

## 2019-11-17 NOTE — Progress Notes (Signed)
Progress Note  Patient Name: Leonard [name] Date of Encounter: 11/17/2019  [facility] Cardiologist:  Subjective   Comfortable laying in bed  No SOB   Inpatient Medications    Scheduled Meds: . amLODipine  5 mg Oral BID  . aspirin  81 mg Oral Daily  . enoxaparin (LOVENOX) injection  30 mg Subcutaneous QHS  . feeding supplement (ENSURE ENLIVE)  237 mL Oral QHS  . furosemide  40 mg Oral Daily  . insulin aspart  0-9 Units Subcutaneous TID WC  . insulin glargine  9 Units Subcutaneous Daily  . isosorbide mononitrate  30 mg Oral Daily  . latanoprost  1 drop Both Eyes QHS  . losartan  100 mg Oral Daily  . mirabegron ER  25 mg Oral Daily  . multivitamin with minerals  1 tablet Oral Daily  . potassium chloride  20 mEq Oral Daily  . tamsulosin  0.4 mg Oral Daily  . vitamin B-12  1,000 mcg Oral Daily   Continuous Infusions:  PRN Meds:    Vital Signs    Vitals:   11/16/19 1156 11/16/19 1616 11/16/19 1943 11/17/19 0447  BP: 115/71 128/84 130/85 132/73  Pulse: 72 88 71 72  Resp: 18 18 15 20   Temp: (!) 97.5 F (36.4 C) 98.6 F (37 C) 98.6 F (37 C) 98.2 F (36.8 C)  TempSrc: Axillary Oral  Axillary  SpO2: 97% 94% 99% 94%  Weight:    60.8 kg  Height:        Intake/Output Summary (Last 24 hours) at 11/17/2019 0625 Last data filed at 11/16/2019 2300 Gross per 24 hour  Intake 360 ml  Output 175 ml  Net 185 ml   Net neg 2.55 L     Last 3 Weights 11/17/2019 11/16/2019 11/15/2019  Weight (lbs) 134 lb 135 lb 137 lb  Weight (kg) 60.782 kg 61.236 kg 62.143 kg      Telemetry      No signif bradycardia  8 beat NSVT - Personally Reviewed  ECG    -None Personally Reviewed  Physical Exam   GEN: No acute distress.   Neck: No JVD Cardiac: RRR, no murmurs Respiratory: Clear to auscultation bilaterally GI: Soft, nontender, non-distended  MS: No edema; No deformity. Neuro:  Nonfocal  Psych: Normal affect   Labs    High Sensitivity Troponin:   Recent Labs    Lab 11/12/19 1622 11/12/19 1816 11/13/19 0804 11/13/19 1218  TROPONINIHS 38* 49* 47* 49*      Chemistry Recent Labs  Lab 11/12/19 1622 11/12/19 1622 11/13/19 0804 11/14/19 0536 11/15/19 0758 11/15/19 0758 11/15/19 0941 11/16/19 0943 11/17/19 0442  NA 135   < > 139   < > 138  --  136  --  137  K 4.7   < > 3.7   < > 3.3*   < > 3.3* 3.9 3.7  CL 106   < > 101   < > 101  --  100  --  105  CO2 19*   < > 23   < > 23  --  21*  --  21*  GLUCOSE 155*   < > 139*   < > 79  --  133*  --  136*  BUN 18   < > 15   < > 19  --  18  --  25*  CREATININE 1.33*   < > 1.31*  1.31*   < > 1.53*  --  1.41*  --  1.42*  CALCIUM 8.2*   < > 8.9   < > 9.1  --  9.1  --  8.8*  PROT 5.9*  --  6.8  --   --   --   --   --   --   ALBUMIN 3.3*  --  3.5  --   --   --   --   --   --   AST 25  --  17  --   --   --   --   --   --   ALT 15  --  20  --   --   --   --   --   --   ALKPHOS 102  --  115  --   --   --   --   --   --   BILITOT 1.2  --  0.7  --   --   --   --   --   --   GFRNONAA 44*   < > 45*  45*   < > 38*  --  41*  --  41*  GFRAA 52*   < > 53*  53*   < > 44*  --  48*  --  48*  ANIONGAP 10   < > 15   < > 14  --  15  --  11   < > = values in this interval not displayed.     Hematology Recent Labs  Lab 11/12/19 1622 11/13/19 0804 11/15/19 0941  WBC 3.7* 4.2 5.3  RBC 4.46 4.89  4.87 5.13  HGB 12.8* 13.9 14.7  HCT 39.1 42.7 43.5  MCV 87.7 87.3 84.8  MCH 28.7 28.4 28.7  MCHC 32.7 32.6 33.8  RDW 14.9 15.0 14.7  PLT 101* 149* 153    BNP Recent Labs  Lab 11/12/19 1630 11/14/19 1032  BNP 447.9* 311.7*     DDimer  Recent Labs  Lab 11/12/19 1745  DDIMER 1.67*     Radiology    No results found.  Cardiac Studies     Patient Profile     86 y.o. male hx of CAD, CHF   Admitted with  SOB  Noted to have conduction dz.  Assessment & Plan   1  Rhythm   HR has remained steady  No signif bradycardia   No pauses      2  Acute on chronic systolic CHF   Volume looks good  Keep  on current regimen   3  HTN  BP controlled   COntinue current regiment  4  CAD  No symptoms of angina   Can be available as needed   Pt has not been in cardiology in several years.  F/U if needed after discharge for any SOB or swelling or dizziness  For questions or updates, please contact [place] Please consult [email or web address] for contact info under        Signed, Dorris Carnes, MD  11/17/2019, 6:25 AM

## 2019-11-17 NOTE — Progress Notes (Addendum)
PROGRESS NOTE    Leonard [name]  VEH:[id number] DOB: 1920-12-30 DOA: 11/12/2019 PCP: Hoyt Koch, MD    Brief Narrative:  Patient 86 year old PMH CAD, ischemic cardiomyopathy EF 45--50 % who presents with SOB and confusion found to have Heart failure exacerbation and hypoxemia.     Consultants:   Cardiology  Procedures:   Antimicrobials:       Subjective: Sitting in the chair today.  Is only oriented to person.  Calm.  Overnight issues noted  Objective: Vitals:   11/16/19 1156 11/16/19 1616 11/16/19 1943 11/17/19 0447  BP: 115/71 128/84 130/85 132/73  Pulse: 72 88 71 72  Resp: 18 18 15 20   Temp: (!) 97.5 F (36.4 C) 98.6 F (37 C) 98.6 F (37 C) 98.2 F (36.8 C)  TempSrc: Axillary Oral  Axillary  SpO2: 97% 94% 99% 94%  Weight:    60.8 kg  Height:        Intake/Output Summary (Last 24 hours) at 11/17/2019 1310 Last data filed at 11/16/2019 2300 Gross per 24 hour  Intake 120 ml  Output 175 ml  Net -55 ml   Filed Weights   11/15/19 0548 11/16/19 0430 11/17/19 0447  Weight: 62.1 kg 61.2 kg 60.8 kg    Examination:  General exam: Appears calm and comfortable in chair Respiratory system: Clear to auscultation. Respiratory effort normal.no rhonchi or rales Upper airway wheezing.  Cardiovascular system: S1 & S2 heard, RRR. No JVD, murmurs, rubs, gallops or clicks.  Gastrointestinal system: Abdomen is nondistended, soft and nontender.Normal bowel sounds heard. Central nervous system: Alert and oriented to person only not to place or date.   Extremitie: no edema Skin: warm, dry Psychiatry: Mood & affect appropriate in current setting.     Data Reviewed: I have personally reviewed following labs and imaging studies  CBC: Recent Labs  Lab 11/12/19 1622 11/13/19 0804 11/15/19 0941  WBC 3.7* 4.2 5.3  NEUTROABS 1.3* 2.0  --   HGB 12.8* 13.9 14.7  HCT 39.1 42.7 43.5  MCV 87.7 87.3 84.8  PLT 101* 149* 836   Basic Metabolic Panel: Recent Labs    Lab 11/13/19 0804 11/13/19 0804 11/14/19 0536 11/15/19 0758 11/15/19 0941 11/16/19 0943 11/17/19 0442  NA 139  --  138 138 136  --  137  K 3.7   < > 4.5 3.3* 3.3* 3.9 3.7  CL 101  --  103 101 100  --  105  CO2 23  --  23 23 21*  --  21*  GLUCOSE 139*  --  160* 79 133*  --  136*  BUN 15  --  17 19 18   --  25*  CREATININE 1.31*  1.31*  --  1.42* 1.53* 1.41*  --  1.42*  CALCIUM 8.9  --  9.0 9.1 9.1  --  8.8*  MG 1.8  --   --   --   --   --   --    < > = values in this interval not displayed.   GFR: Estimated Creatinine Clearance: 24.9 mL/min (A) (by C-G formula based on SCr of 1.42 mg/dL (H)). Liver Function Tests: Recent Labs  Lab 11/12/19 1622 11/13/19 0804  AST 25 17  ALT 15 20  ALKPHOS 102 115  BILITOT 1.2 0.7  PROT 5.9* 6.8  ALBUMIN 3.3* 3.5   No results for input(s): LIPASE, AMYLASE in the last 168 hours. Recent Labs  Lab 11/13/19 0804  AMMONIA 18   Coagulation Profile:  No results for input(s): INR, PROTIME in the last 168 hours. Cardiac Enzymes: No results for input(s): CKTOTAL, CKMB, CKMBINDEX, TROPONINI in the last 168 hours. BNP (last 3 results) No results for input(s): PROBNP in the last 8760 hours. HbA1C: No results for input(s): HGBA1C in the last 72 hours. CBG: Recent Labs  Lab 11/16/19 1155 11/16/19 1618 11/16/19 2316 11/17/19 0615 11/17/19 1159  GLUCAP 198* 150* 220* 121* 203*   Lipid Profile: No results for input(s): CHOL, HDL, LDLCALC, TRIG, CHOLHDL, LDLDIRECT in the last 72 hours. Thyroid Function Tests: No results for input(s): TSH, T4TOTAL, FREET4, T3FREE, THYROIDAB in the last 72 hours. Anemia Panel: No results for input(s): VITAMINB12, FOLATE, FERRITIN, TIBC, IRON, RETICCTPCT in the last 72 hours. Sepsis Labs: No results for input(s): PROCALCITON, LATICACIDVEN in the last 168 hours.  Recent Results (from the past 240 hour(s))  SARS Coronavirus 2 by RT PCR (hospital order, performed in [facility] hospital lab) Nasopharyngeal  Nasopharyngeal Swab     Status: None   Collection Time: 11/12/19  5:31 PM   Specimen: Nasopharyngeal Swab  Result Value Ref Range Status   SARS Coronavirus 2 NEGATIVE NEGATIVE Final    Comment: (NOTE) SARS-CoV-2 target nucleic acids are NOT DETECTED. The SARS-CoV-2 RNA is generally detectable in upper and lower respiratory specimens during the acute phase of infection. The lowest concentration of SARS-CoV-2 viral copies this assay can detect is 250 copies / mL. A negative result does not preclude SARS-CoV-2 infection and should not be used as the sole basis for treatment or other patient management decisions.  A negative result may occur with improper specimen collection / handling, submission of specimen other than nasopharyngeal swab, presence of viral mutation(s) within the areas targeted by this assay, and inadequate number of viral copies (<250 copies / mL). A negative result must be combined with clinical observations, patient history, and epidemiological information. Fact Sheet for Patients:   StrictlyIdeas.no Fact Sheet for Healthcare Providers: BankingDealers.co.za This test is not yet approved or cleared  by the [place] FDA and has been authorized for detection and/or diagnosis of SARS-CoV-2 by FDA under an Emergency Use Authorization (EUA).  This EUA will remain in effect (meaning this test can be used) for the duration of the COVID-19 declaration under Section 564(b)(1) of the Act, 21 U.S.C. section 360bbb-3(b)(1), unless the authorization is terminated or revoked sooner. Performed at [facility], [place] [address] [place], [place] [address]   MRSA PCR Screening     Status: None   Collection Time: 11/16/19  1:16 AM   Specimen: Nasal Mucosa; Nasopharyngeal  Result Value Ref Range Status   MRSA by PCR NEGATIVE NEGATIVE Final    Comment:        The GeneXpert MRSA Assay (FDA approved for NASAL specimens only), is  one component of a comprehensive MRSA colonization surveillance program. It is not intended to diagnose MRSA infection nor to guide or monitor treatment for MRSA infections. Performed at [facility], [place] [address], [place], [place] [address]   SARS CORONAVIRUS 2 (TAT 6-24 HRS) Nasopharyngeal Nasopharyngeal Swab     Status: None   Collection Time: 11/16/19  6:05 PM   Specimen: Nasopharyngeal Swab  Result Value Ref Range Status   SARS Coronavirus 2 NEGATIVE NEGATIVE Final    Comment: (NOTE) SARS-CoV-2 target nucleic acids are NOT DETECTED.  The SARS-CoV-2 RNA is generally detectable in upper and lower respiratory specimens during the acute phase of infection. Negative results do not preclude  SARS-CoV-2 infection, do not rule out co-infections with other pathogens, and should not be used as the sole basis for treatment or other patient management decisions. Negative results must be combined with clinical observations, patient history, and epidemiological information. The expected result is Negative.  Fact Sheet for Patients: SugarRoll.be  Fact Sheet for Healthcare Providers: https://www.woods-mathews.com/  This test is not yet approved or cleared by the [place] FDA and  has been authorized for detection and/or diagnosis of SARS-CoV-2 by FDA under an Emergency Use Authorization (EUA). This EUA will remain  in effect (meaning this test can be used) for the duration of the COVID-19 declaration under Se ction 564(b)(1) of the Act, 21 U.S.C. section 360bbb-3(b)(1), unless the authorization is terminated or revoked sooner.  Performed at [facility], [place] [address], [place], [place] [address]          Radiology Studies: No results found.      Scheduled Meds: . amLODipine  5 mg Oral BID  . aspirin  81 mg Oral Daily  . enoxaparin (LOVENOX) injection  30 mg Subcutaneous QHS  . feeding supplement (ENSURE  ENLIVE)  237 mL Oral QHS  . furosemide  40 mg Oral Daily  . insulin aspart  0-9 Units Subcutaneous TID WC  . insulin glargine  9 Units Subcutaneous Daily  . isosorbide mononitrate  30 mg Oral Daily  . latanoprost  1 drop Both Eyes QHS  . losartan  100 mg Oral Daily  . mirabegron ER  25 mg Oral Daily  . multivitamin with minerals  1 tablet Oral Daily  . potassium chloride  20 mEq Oral Daily  . tamsulosin  0.4 mg Oral Daily  . vitamin B-12  1,000 mcg Oral Daily   Continuous Infusions:  Assessment & Plan:   Principal Problem:   Acute CHF (congestive heart failure) (HCC) Active Problems:   Uncontrolled type 2 diabetes with renal manifestation (HCC)   Essential hypertension   Chronic renal insufficiency, stage III (moderate)   CHF (congestive heart failure) (HCC)   Complete heart block (HCC)   Acute encephalopathy   Malnutrition of moderate degree  1-Acute on chronic systolic heart failure exacerbation;  CT with pulmonary edema, Elevated BNP Cards following.  Lasix switch from iv to po. bnp less but still elevated from baseline Upper airway wheezing mildly, will monitor, if need to will give dose of iv steroid.  2-Complete Heart Block Cardiology has been consulted.  Avoid AV block agents.  Asymptomatic.   3-Pancytopenia; WBC normalized. Platelet mildly low.  Suspect related to B 12 deficiency./ iron deficiency Started B 12 supplement. Iron   Diabetes type 2; Continue with SSI.  Continue with lantus.   Acute Metabolic encephalopathy;  Suspect multifactorial., related to acute illness, Hypoxemia, B 12 deficiency.  Support care.  Start B-12 supplement.  Hold gabapentin for now.   HTN;  Continue amlodipine, losartan, Lasix switched to p.o. daily Started on Imdur from isordil. If bp elevated,start with lowest dose hydralazine 10mg  bid  CKD stage III; stable.    CAD, S/P CABG. Continue with aspirin. see above. Ax  Sundowning/delirium- was given on seroquel,  but son in law thought this made it worse. Thus discontinued, but still sundowns. 6/12: overnight did better. Will continue to monitor      DVT prophylaxis: Lovenox.  Code Status: DNR Family Communication: daughter updated via phone today Disposition Plan:  Status is: Inpatient  Remains inpatient appropriate because:Hemodynamically unstable   Dispo: The patient is from: SNF  Anticipated d/c is to: SNF  Anticipated d/c date is: awaiting authorization for SNF, needs to be off               restraints for 48-72hrs, possibly by monday        LOS: 5 days   Time spent: 45 min with >50% on coc    Nolberto Hanlon, MD [facility] Pager [phone]  If 7PM-7AM, please contact night-coverage [email or web address] Password [email or web address] 11/17/2019, 1:10 PM   Patient ID: Leonard [name], male   DOB: [birth year], 86 y.o.   MRN: [id number]

## 2019-11-17 NOTE — TOC Progression Note (Addendum)
Transition of Care [facility]) - Progression Note    Patient Details  Name: Leonard [name] MRN: [id number] Date of Birth: [birth year]  Transition of Care [facility]) CM/SW [place],  Phone Number: [phone] 11/17/2019, 9:42 AM  Clinical Narrative:     CSW called [name] to ascertain bed readiness. CSW had to leave message.  11:00am- CSW spoke with [name] at [facility] and she stated that she would check for bed readiness. [name] also stated that patient would have to be restraints free for 72 hours.   CSW will follow up with patient's daughter about her other facility choice.  12:15pm- CSW spoke with both [name] and [name] both cannot accept patient until Monday.  CSW spoke with patient's daughter and she chose [facility] [name] at [facility] stated that patient can come Monday morning as long as he remains restraints free. No new COVID needed.  CSW will continue to follow for discharge planning needs.   Expected Discharge Plan: [place] Barriers to Discharge: Continued Medical Work up, [place] (PASRR)  Expected Discharge Plan and Services Expected Discharge Plan: [place] arrangements for the past 2 months: [place] Expected Discharge Date: 11/15/19                                     Social Determinants of Health (SDOH) Interventions    Readmission Risk Interventions No flowsheet data found.

## 2019-11-17 NOTE — Progress Notes (Signed)
Patient did much better overnight. Only gets anxious when he has to urinate. RN assessed toileting needs and plan that allowed patient to sit on [facility] has worked throughout the night.

## 2019-11-18 LAB — GLUCOSE, CAPILLARY
Glucose-Capillary: 114 mg/dL — ABNORMAL HIGH (ref 70–99)
Glucose-Capillary: 208 mg/dL — ABNORMAL HIGH (ref 70–99)
Glucose-Capillary: 230 mg/dL — ABNORMAL HIGH (ref 70–99)
Glucose-Capillary: 198 mg/dL — ABNORMAL HIGH (ref 70–99)

## 2019-11-18 NOTE — Progress Notes (Addendum)
PROGRESS NOTE    Leonard [name]  BHA:[id number] DOB: 08-May-1921 DOA: 11/12/2019 PCP: Hoyt Koch, MD    Brief Narrative:  Patient 86 year old PMH CAD, ischemic cardiomyopathy EF 45--50 % who presents with SOB and confusion found to have Heart failure exacerbation and hypoxemia.     Consultants:   Cardiology  Procedures:   Antimicrobials:       Subjective: Lying in bed. Has no complaints.   Objective: Vitals:   11/16/19 1943 11/17/19 0447 11/17/19 2127 11/18/19 0618  BP: 130/85 132/73 (!) 151/84 130/77  Pulse: 71 72 79 78  Resp: 15 20 18 18   Temp: 98.6 F (37 C) 98.2 F (36.8 C) (!) 97.3 F (36.3 C) 98.6 F (37 C)  TempSrc:  Axillary Oral Oral  SpO2: 99% 94% 98% 99%  Weight:  60.8 kg  60.8 kg  Height:        Intake/Output Summary (Last 24 hours) at 11/18/2019 0803 Last data filed at 11/17/2019 1700 Gross per 24 hour  Intake 600 ml  Output --  Net 600 ml   Filed Weights   11/16/19 0430 11/17/19 0447 11/18/19 0618  Weight: 61.2 kg 60.8 kg 60.8 kg    Examination:  General exam: Appears calm and comfortable, lying in bed, nad Respiratory system: Clear to auscultation. Respiratory effort normal. No r/r/w Cardiovascular system: S1 & S2 heard, RRR. No JVD, murmurs, rubs, gallops or clicks.  Gastrointestinal system: Abdomen is nondistended, soft and nontender.Normal bowel sounds heard.no rebound Central nervous system: aaxox3 Extremitie: no edema or cyanosis Skin: warm, dry Psychiatry: Mood & affect appropriate in current setting.     Data Reviewed: I have personally reviewed following labs and imaging studies  CBC: Recent Labs  Lab 11/12/19 1622 11/13/19 0804 11/15/19 0941  WBC 3.7* 4.2 5.3  NEUTROABS 1.3* 2.0  --   HGB 12.8* 13.9 14.7  HCT 39.1 42.7 43.5  MCV 87.7 87.3 84.8  PLT 101* 149* 973   Basic Metabolic Panel: Recent Labs  Lab 11/13/19 0804 11/13/19 0804 11/14/19 0536 11/15/19 0758 11/15/19 0941 11/16/19 0943  11/17/19 0442  NA 139  --  138 138 136  --  137  K 3.7   < > 4.5 3.3* 3.3* 3.9 3.7  CL 101  --  103 101 100  --  105  CO2 23  --  23 23 21*  --  21*  GLUCOSE 139*  --  160* 79 133*  --  136*  BUN 15  --  17 19 18   --  25*  CREATININE 1.31*  1.31*  --  1.42* 1.53* 1.41*  --  1.42*  CALCIUM 8.9  --  9.0 9.1 9.1  --  8.8*  MG 1.8  --   --   --   --   --   --    < > = values in this interval not displayed.   GFR: Estimated Creatinine Clearance: 24.9 mL/min (A) (by C-G formula based on SCr of 1.42 mg/dL (H)). Liver Function Tests: Recent Labs  Lab 11/12/19 1622 11/13/19 0804  AST 25 17  ALT 15 20  ALKPHOS 102 115  BILITOT 1.2 0.7  PROT 5.9* 6.8  ALBUMIN 3.3* 3.5   No results for input(s): LIPASE, AMYLASE in the last 168 hours. Recent Labs  Lab 11/13/19 0804  AMMONIA 18   Coagulation Profile: No results for input(s): INR, PROTIME in the last 168 hours. Cardiac Enzymes: No results for input(s): CKTOTAL, CKMB, CKMBINDEX, TROPONINI in  the last 168 hours. BNP (last 3 results) No results for input(s): PROBNP in the last 8760 hours. HbA1C: No results for input(s): HGBA1C in the last 72 hours. CBG: Recent Labs  Lab 11/17/19 0615 11/17/19 1159 11/17/19 1704 11/17/19 2125 11/18/19 0616  GLUCAP 121* 203* 140* 182* 114*   Lipid Profile: No results for input(s): CHOL, HDL, LDLCALC, TRIG, CHOLHDL, LDLDIRECT in the last 72 hours. Thyroid Function Tests: No results for input(s): TSH, T4TOTAL, FREET4, T3FREE, THYROIDAB in the last 72 hours. Anemia Panel: No results for input(s): VITAMINB12, FOLATE, FERRITIN, TIBC, IRON, RETICCTPCT in the last 72 hours. Sepsis Labs: No results for input(s): PROCALCITON, LATICACIDVEN in the last 168 hours.  Recent Results (from the past 240 hour(s))  SARS Coronavirus 2 by RT PCR (hospital order, performed in [facility] hospital lab) Nasopharyngeal Nasopharyngeal Swab     Status: None   Collection Time: 11/12/19  5:31 PM   Specimen:  Nasopharyngeal Swab  Result Value Ref Range Status   SARS Coronavirus 2 NEGATIVE NEGATIVE Final    Comment: (NOTE) SARS-CoV-2 target nucleic acids are NOT DETECTED. The SARS-CoV-2 RNA is generally detectable in upper and lower respiratory specimens during the acute phase of infection. The lowest concentration of SARS-CoV-2 viral copies this assay can detect is 250 copies / mL. A negative result does not preclude SARS-CoV-2 infection and should not be used as the sole basis for treatment or other patient management decisions.  A negative result may occur with improper specimen collection / handling, submission of specimen other than nasopharyngeal swab, presence of viral mutation(s) within the areas targeted by this assay, and inadequate number of viral copies (<250 copies / mL). A negative result must be combined with clinical observations, patient history, and epidemiological information. Fact Sheet for Patients:   StrictlyIdeas.no Fact Sheet for Healthcare Providers: BankingDealers.co.za This test is not yet approved or cleared  by the [place] FDA and has been authorized for detection and/or diagnosis of SARS-CoV-2 by FDA under an Emergency Use Authorization (EUA).  This EUA will remain in effect (meaning this test can be used) for the duration of the COVID-19 declaration under Section 564(b)(1) of the Act, 21 U.S.C. section 360bbb-3(b)(1), unless the authorization is terminated or revoked sooner. Performed at [facility], [place] [address], [place], [place] [address]   MRSA PCR Screening     Status: None   Collection Time: 11/16/19  1:16 AM   Specimen: Nasal Mucosa; Nasopharyngeal  Result Value Ref Range Status   MRSA by PCR NEGATIVE NEGATIVE Final    Comment:        The GeneXpert MRSA Assay (FDA approved for NASAL specimens only), is one component of a comprehensive MRSA colonization surveillance program. It is  not intended to diagnose MRSA infection nor to guide or monitor treatment for MRSA infections. Performed at [facility], [place] [address] [place], [place] [address]   SARS CORONAVIRUS 2 (TAT 6-24 HRS) Nasopharyngeal Nasopharyngeal Swab     Status: None   Collection Time: 11/16/19  6:05 PM   Specimen: Nasopharyngeal Swab  Result Value Ref Range Status   SARS Coronavirus 2 NEGATIVE NEGATIVE Final    Comment: (NOTE) SARS-CoV-2 target nucleic acids are NOT DETECTED.  The SARS-CoV-2 RNA is generally detectable in upper and lower respiratory specimens during the acute phase of infection. Negative results do not preclude SARS-CoV-2 infection, do not rule out co-infections with other pathogens, and should not be used as the sole basis for treatment or  other patient management decisions. Negative results must be combined with clinical observations, patient history, and epidemiological information. The expected result is Negative.  Fact Sheet for Patients: SugarRoll.be  Fact Sheet for Healthcare Providers: https://www.woods-mathews.com/  This test is not yet approved or cleared by the [place] FDA and  has been authorized for detection and/or diagnosis of SARS-CoV-2 by FDA under an Emergency Use Authorization (EUA). This EUA will remain  in effect (meaning this test can be used) for the duration of the COVID-19 declaration under Se ction 564(b)(1) of the Act, 21 U.S.C. section 360bbb-3(b)(1), unless the authorization is terminated or revoked sooner.  Performed at [facility], [place] [address], [place], [place] [address]          Radiology Studies: No results found.      Scheduled Meds: . amLODipine  5 mg Oral BID  . aspirin  81 mg Oral Daily  . enoxaparin (LOVENOX) injection  30 mg Subcutaneous QHS  . feeding supplement (ENSURE ENLIVE)  237 mL Oral QHS  . furosemide  40 mg Oral Daily  . insulin aspart  0-9 Units  Subcutaneous TID WC  . insulin glargine  9 Units Subcutaneous Daily  . isosorbide mononitrate  30 mg Oral Daily  . latanoprost  1 drop Both Eyes QHS  . losartan  100 mg Oral Daily  . mirabegron ER  25 mg Oral Daily  . multivitamin with minerals  1 tablet Oral Daily  . potassium chloride  20 mEq Oral Daily  . tamsulosin  0.4 mg Oral Daily  . vitamin B-12  1,000 mcg Oral Daily   Continuous Infusions:  Assessment & Plan:   Principal Problem:   Acute CHF (congestive heart failure) (HCC) Active Problems:   Uncontrolled type 2 diabetes with renal manifestation (HCC)   Essential hypertension   Chronic renal insufficiency, stage III (moderate)   CHF (congestive heart failure) (HCC)   Complete heart block (HCC)   Acute encephalopathy   Malnutrition of moderate degree  1-Acute on chronic systolic heart failure exacerbation;  CT with pulmonary edema, Elevated BNP Clinically improved now. Euvolemic on exam Cards following-signed off Continue Lasix  po.    2-Complete Heart Block Cardiology has been consulted.  Avoid AV block agents.  Asymptomatic.   3-Pancytopenia; WBC normalized. Platelet mildly low.  Suspect related to B 12 deficiency./ iron deficiency Started B 12 supplement. Iron   Diabetes type 2; Continue with SSI.  Continue with lantus.   Acute Metabolic encephalopathy;  Suspect multifactorial., related to acute illness, Hypoxemia, B 12 deficiency.  Support care.  Start B-12 supplement.  Hold gabapentin for now.  Improved  HTN;  Continue amlodipine, losartan, Lasix switched to p.o. daily Started on Imdur from isordil. If bp elevated,start with lowest dose hydralazine 10mg  bid  CKD stage IIIA; stable.    CAD, S/P CABG. Continue with aspirin. see above. Ax  Sundowning/delirium- was given on seroquel, but son in law thought this made it worse. Thus discontinued, but still sundowns. Has improved      DVT prophylaxis: Lovenox.  Code Status:  DNR Family Communication: daughter updated via phone today Disposition Plan:  Status is: Inpatient  Remains inpatient appropriate because:Hemodynamically unstable   Dispo: The patient is from: SNF  Anticipated d/c is to: SNF  Anticipated d/c date is: awaiting authorization for SNF, needs to be off               restraints for 48-72hrs, possibly by [facility]  LOS: 6 days   Time spent: 45 min with >50% on coc    Nolberto Hanlon, MD [facility] Pager [phone]  If 7PM-7AM, please contact night-coverage [email or web address] Password [facility] 11/18/2019, 8:03 AM

## 2019-11-18 NOTE — Progress Notes (Signed)
Pt resting comfortably     I saw yesterday    Remains in SR     No new recommendations   Will sign off with follow up as needed as outpt.   If doing OK, given age, can see PCP  Dorris Carnes MD

## 2019-11-19 LAB — GLUCOSE, CAPILLARY
Glucose-Capillary: 141 mg/dL — ABNORMAL HIGH (ref 70–99)
Glucose-Capillary: 191 mg/dL — ABNORMAL HIGH (ref 70–99)
Glucose-Capillary: 241 mg/dL — ABNORMAL HIGH (ref 70–99)

## 2019-11-19 MED ORDER — POTASSIUM CHLORIDE CRYS ER 20 MEQ PO TBCR: 20.0000 meq | EXTENDED_RELEASE_TABLET | Freq: Every day | ORAL | 0 refills | Status: DC

## 2019-11-19 NOTE — Progress Notes (Signed)
Pt ambulated to the bathroom using his walker.  Placed back in the chair with chair alarm set. Pt is  Calm and comfortable sitting in chair. Call bell within reach.

## 2019-11-19 NOTE — Progress Notes (Signed)
Physical Therapy Treatment Patient Details Name: Leonard [name] MRN: [id number] DOB: 03-13-1921 Today's Date: 11/19/2019    History of Present Illness Pt is a 86 y/o male admitted secondary to CHF exacerbation and acute encephalopathy. PMH includes DM, HTN, CHF, CAD s/p CABG, and prostate cancer.     PT Comments    Pt admitted with above diagnosis. Pt refused ultimately to get out of chair even though PT tried to encourage mobility. PT also tried to get pt to scoot up in chair as he was slouched but pt would not correct posture.  Notified nursing.  Pt currently with functional limitations due to balance and endruance deficits. Pt will benefit from skilled PT to increase their independence and safety with mobility to allow discharge to the venue listed below.     Follow Up Recommendations  SNF;Supervision/Assistance - 24 hour     Equipment Recommendations  None recommended by PT    Recommendations for Other Services       Precautions / Restrictions Precautions Precautions: Fall Restrictions Weight Bearing Restrictions: No    Mobility  Bed Mobility               General bed mobility comments: in chair on arrival slumped down somewhat. Tried to assist pt to scoot up in chair with little success. He scooted up a little and PT thought he was going to get up with PT however pt then refused to get up.  Had to leave him slouched in chair but chair alarm was in place.   Transfers                 General transfer comment: refused  Ambulation/Gait                 Stairs             Wheelchair Mobility    Modified Rankin (Stroke Patients Only)       Balance                                            Cognition Arousal/Alertness: Awake/alert Behavior During Therapy: Flat affect Overall Cognitive Status: Impaired/Different from baseline Area of Impairment: Orientation;Attention;Memory;Following  commands;Safety/judgement;Awareness;Problem solving                 Orientation Level: Disoriented to;Place;Time;Situation Current Attention Level: Sustained Memory: Decreased short-term memory;Decreased recall of precautions Following Commands: Follows one step commands with increased time Safety/Judgement: Decreased awareness of deficits;Decreased awareness of safety Awareness: Intellectual Problem Solving: Slow processing;Difficulty sequencing;Requires verbal cues;Requires tactile cues General Comments: Pt requiring multimodal cues for sequencing. Pt adamant about not moving out of chair even with max encouragement.       Exercises General Exercises - Lower Extremity Heel Slides: AROM;Both;5 reps;Supine    General Comments        Pertinent Vitals/Pain Pain Assessment: No/denies pain    Home Living                      Prior Function            PT Goals (current goals can now be found in the care plan section) Progress towards PT goals: Not progressing toward goals - comment (self limting during therapy today.)    Frequency    Min 2X/week      PT Plan Current plan remains appropriate  Co-evaluation              AM-PAC PT "6 Clicks" Mobility   Outcome Measure  Help needed turning from your back to your side while in a flat bed without using bedrails?: A Little Help needed moving from lying on your back to sitting on the side of a flat bed without using bedrails?: A Lot Help needed moving to and from a bed to a chair (including a wheelchair)?: A Lot Help needed standing up from a chair using your arms (e.g., wheelchair or bedside chair)?: A Lot Help needed to walk in hospital room?: A Lot Help needed climbing 3-5 steps with a railing? : Total 6 Click Score: 12    End of Session Equipment Utilized During Treatment: Gait belt Activity Tolerance: Patient limited by fatigue (pt very self limiting) Patient left: with call bell/phone within  reach;in chair;with chair alarm set Nurse Communication: Mobility status PT Visit Diagnosis: Unsteadiness on feet (R26.81);Muscle weakness (generalized) (M62.81)     Time: 5027-7412 PT Time Calculation (min) (ACUTE ONLY): 10 min  Charges:  $Therapeutic Activity: 8-22 mins                     Otoniel Myhand W,PT Acute Rehabilitation Services Pager:  [phone]  Office:  [place] 11/19/2019, 4:38 PM

## 2019-11-19 NOTE — Progress Notes (Signed)
Pt's grandson is at bedside. Plan of care discussed with grandson.  Questions and concerns answered.

## 2019-11-19 NOTE — TOC Progression Note (Addendum)
Transition of Care [facility]) - Progression Note    Patient Details  Name: Leonard [name] MRN: [id number] Date of Birth: 1921/01/08  Transition of Care [facility]) CM/SW [place], [place] Phone Number: 11/19/2019, 8:35 AM  Clinical Narrative:    5:15p [name] of [name], RN and CSW met with patient in an attempt for patient to be assessed for discharge to SNF. Patient was visibly breathing, but sleeping and unable to be aroused after some attempts. [name] requested patient's behaviors be documented overnight for review in the morning and patient can be discharged pending review. CSW contacted patient's daughter and provided her with an update.   3:15p CSW contacted by patient's daughter for an update. CSW agreed to follow-up with [facility] for an estimated time of visit. CSW spoke with [name] from [facility] and confirmed she will be coming to the hospital to assess patient within the next hour. CSW called daughter and provided update. Daughter requested CSW follow-up with [facility] for potential placement, pending [facility] follow-up. CSW contacted [facility] admissions and left a voicemail. Awaiting a call back.  11:45a CSW spoke with [name] of [facility] and informed an in-person assessment needs to be completed on patient readiness to discharge to SNF. CSW contacted patient's daughter Neoma Laming to provide an update. CSW will continue to follow.  8:35a CSW contacted [name] with [facility] to confirm patient's discharge to SNF. [name] stated she will follow-up with CSW after reviewing patient's weekend clinicals, to confirm.   Expected Discharge Plan: [place] Barriers to Discharge: No Barriers Identified  Expected Discharge Plan and Services Expected Discharge Plan: [place] arrangements for the past 2 months: [place] Expected Discharge Date: 11/15/19                                      Social Determinants of Health (SDOH) Interventions    Readmission Risk Interventions No flowsheet data found.

## 2019-11-19 NOTE — Progress Notes (Addendum)
Pt is currently sitting in chair with eyes close. Responsive to speech and refusing to talk to [place]. [name] from facility stated that "is unable to do a true assessment", charge nurse and SW present.  Pt not going to facility today, however will be accepted to go to facility tomorrow.  [name] requested to document pt's behavior per shift.

## 2019-11-20 LAB — CREATININE, SERUM
Creatinine, Ser: 1.3 mg/dL — ABNORMAL HIGH (ref 0.61–1.24)
GFR calc Af Amer: 53 mL/min — ABNORMAL LOW (ref >60)
GFR calc non Af Amer: 46 mL/min — ABNORMAL LOW (ref >60)

## 2019-11-20 LAB — GLUCOSE, CAPILLARY
Glucose-Capillary: 137 mg/dL — ABNORMAL HIGH (ref 70–99)
Glucose-Capillary: 193 mg/dL — ABNORMAL HIGH (ref 70–99)

## 2019-11-20 NOTE — Progress Notes (Signed)
Patient resting quietly and comfortably, toileting offered before patient went to sleep. Patient has not had any instances of physical safety issues.

## 2019-11-20 NOTE — TOC Transition Note (Signed)
Transition of Care [facility]) - CM/SW Discharge Note   Patient Details  Name: Leonard [name] MRN: [id number] Date of Birth: [birth year]  Transition of Care [facility]) CM/SW Contact:  Trula Ore, [place] Phone Number: 11/20/2019, 12:01 PM   Clinical Narrative:     Patient will DC to: [facility]  Anticipated DC date: 11/20/2019  Family notified: Neoma Laming  Transport by: Corey Harold  ?  Per MD patient ready for DC to [facility]. RN, patient, patient's family, and facility notified of DC. Discharge Summary sent to facility. RN given number for report tele# [phone]. DC packet on chart. Ambulance transport requested for patient.  CSW signing off.  Final next level of care: Skilled Nursing Facility Barriers to Discharge: No Barriers Identified   Patient Goals and CMS Choice Patient states their goals for this hospitalization and ongoing recovery are:: to go to SNF CMS Medicare.gov Compare Post Acute Care list provided to:: Patient Represenative (must comment) (patients daughter Neoma Laming) Choice offered to / list presented to : Adult Children (patients daughter Neoma Laming)  Discharge Placement              Patient chooses bed at: [facility] Patient to be transferred to facility by: [place] Name of family member notified: Neoma Laming Patient and family notified of of transfer: 11/20/19  Discharge Plan and Services                                     Social Determinants of Health (SDOH) Interventions     Readmission Risk Interventions No flowsheet data found.

## 2019-11-20 NOTE — Progress Notes (Signed)
Report given to Laura, LPN at [facility]. All questions answered and appropriate information given. Patient packet is complete and awaiting discharge transportation. DNR is in the packet

## 2019-11-20 NOTE — Progress Notes (Signed)
Nutrition Follow-up  DOCUMENTATION CODES:   Non-severe (moderate) malnutrition in context of chronic illness  INTERVENTION:   -Continue Ensure Enlive po daily, each supplement provides 350 kcal and 20 grams of protein -Continue Magic cup TID with meals, each supplement provides 290 kcal and 9 grams of protein -Continue MVI with minerals daily  NUTRITION DIAGNOSIS:   Moderate Malnutrition related to chronic illness (ischemic cardiomyopathy) as evidenced by mild fat depletion, moderate fat depletion, mild muscle depletion, moderate muscle depletion.  Ongoing   GOAL:   Patient will meet greater than or equal to 90% of their needs  Progressing   MONITOR:   PO intake, Supplement acceptance, Labs, Weight trends, Skin  REASON FOR ASSESSMENT:   Malnutrition Screening Tool    ASSESSMENT:   Leonard [name] is a 86 y.o. male with history of CAD status post CABG, ischemic cardiomyopathy last EF measured in 2018 was 45 to 50%, hypertension diabetes mellitus type 2, chronic kidney disease who was admitted in January of this year for Covid 19 infection was noticed to have increasing shortness of breath over the last 3 days by patient's family.  Patient did not have any chest pain or fever or chills but has been having some cough.  Patient was also noticed to be increasingly confused yesterday.  Reviewed I/O's: +720 ml x 24 hours and -1.4 L since admission  Pt resting in recliner chair at time of visit. Pt sometime minimally interactive with staff.  Noted pt with variable appetite. Meal completion 25-75%. Pt accepts Ensure supplement about 50% of time it is offered.   Per chart review, likely plan to d/c to SNF today.   Medications reviewed and include lasix.   Labs reviewed: CBGS: 141-241 (inpatient orders for glycemic control are 0-9 units insulin aspart TID with meals and 9 units insulin glargine daily).   Diet Order:   Diet Order            Diet - low sodium heart healthy            Diet heart healthy/carb modified Room service appropriate? Yes; Fluid consistency: Thin; Fluid restriction: 1200 mL Fluid  Diet effective now                 EDUCATION NEEDS:   Education needs have been addressed  Skin:  Skin Assessment: Reviewed RN Assessment  Last BM:  11/19/19  Height:   Ht Readings from Last 1 Encounters:  11/13/19 5\' 4"  (1.626 m)    Weight:   Wt Readings from Last 1 Encounters:  11/20/19 58.1 kg    Ideal Body Weight:  54.5 kg  BMI:  Body mass index is 21.97 kg/m.  Estimated Nutritional Needs:   Kcal:  1600-1800  Protein:  75-90 grams  Fluid:  1.2 L    Loistine Chance, RD, LDN, [place] Registered Dietitian II Certified Diabetes Care and Education Specialist Please refer to [facility] for RD and/or RD on-call/weekend/after hours pager

## 2019-11-20 NOTE — Plan of Care (Signed)
  Problem: Education: Goal: Knowledge of General Education information will improve Description: Including pain rating scale, medication(s)/side effects and non-pharmacologic comfort measures Outcome: Adequate for Discharge   Problem: Health Behavior/Discharge Planning: Goal: Ability to manage health-related needs will improve Outcome: Adequate for Discharge   Problem: Clinical Measurements: Goal: Ability to maintain clinical measurements within normal limits will improve Outcome: Adequate for Discharge Goal: Will remain free from infection Outcome: Adequate for Discharge Goal: Diagnostic test results will improve Outcome: Adequate for Discharge Goal: Respiratory complications will improve Outcome: Adequate for Discharge Goal: Cardiovascular complication will be avoided Outcome: Adequate for Discharge   Problem: Activity: Goal: Risk for activity intolerance will decrease Outcome: Adequate for Discharge   Problem: Nutrition: Goal: Adequate nutrition will be maintained Outcome: Adequate for Discharge   Problem: Coping: Goal: Level of anxiety will decrease Outcome: Adequate for Discharge   Problem: Elimination: Goal: Will not experience complications related to bowel motility Outcome: Adequate for Discharge Goal: Will not experience complications related to urinary retention Outcome: Adequate for Discharge   Problem: Safety: Goal: Ability to remain free from injury will improve Outcome: Adequate for Discharge   Problem: Education: Goal: Ability to demonstrate management of disease process will improve Outcome: Adequate for Discharge Goal: Ability to verbalize understanding of medication therapies will improve Outcome: Adequate for Discharge Goal: Individualized Educational Video(s) Outcome: Adequate for Discharge   Problem: Activity: Goal: Capacity to carry out activities will improve Outcome: Adequate for Discharge   Problem: Cardiac: Goal: Ability to achieve and  maintain adequate cardiopulmonary perfusion will improve Outcome: Adequate for Discharge

## 2019-11-20 NOTE — Progress Notes (Signed)
Patient assisted to the bathroom by a floor RN and then set up in the chair with chair alarm. Patient expressing he does not want any breakfast but that he would be willing to have some orange juice and take his medications. Patient is cooperative and calm.

## 2019-11-21 NOTE — Telephone Encounter (Signed)
**Note De-Identified  Obfuscation** Per the pts discharge summary he was discharged from [facility] to a SNF: Leonard [name] as follows:  Per MD patient ready for DC to [facility]. RN, patient, patient's family, and facility notified of DC. Discharge Summary sent to facility. DC packet on chart. Ambulance transport requested for patient.  TCM call not needed.

## 2019-11-27 NOTE — Progress Notes (Signed)
CARDIOLOGY OFFICE NOTE  Date:  12/05/2019    Leonard [name] Date of Birth: Dec 18, 1920 Medical Record #[id number]  PCP:  Leonard Koch, MD  Cardiologist:  Leonard [name]    Chief Complaint  Patient presents with  . Follow-up    History of Present Illness: Leonard [name] is a 86 y.o. male who presents today for a post hospital visit/TOC visit - however outside the timeframe for TOC. Seen for Dr. Johnsie Cancel & [name].   He has a history of known CAD status post CABG with redo in 1997 - last cath in 2005 with patent LIMA and patent SVG to OM and PD, ICM with last EF measured in 2018 was 45 to 50%, HTN, DM, CKD and prior COVID illness.   He has not been seen here since October of 2018. This was following a hospitalization - Myoview with small area of inferior and lateral wall ischemia. Echo 03/12/17 reviewed EF 45-50%  Mild MR Severe LAE. Has been managed medically. He resides at [facility] and wife is in memory unit.   Admitted earlier this month with CHF - noted conduction disease on EKG -  No significant bradycardia or pauses. No worsening AV block. There were no plans for PPM. Treated with low dose Lasix after REDS vest reading a little elevated. Stay was complicated by confusion/sun downing - discharged back to SNF.   The patient does not have symptoms concerning for COVID-19 infection (fever, chills, cough, or new shortness of breath).   Comes in today. Here with his son in law - who is a physician/anesthesiologist - Leonard Needles, MD.  Several concerns and issues.  Discharged 2 weeks ago to SNF/rehab.  Currently at [facility]. Mental status has continued to be an issue - sundowning has continued. Has not really regained "full mental status" - very much confused at times but also coherent at times. Some of this was present previously. Wife of 3 years has passed recently. Today, had a spell where he almost collapsed coming here today - leaning to the left. More  lethargic than normal. Thinking blood sugar was low, someone here gave him some chocolate. Extremely subdued during the ride over here which family felt was more pronounced. Family's plan was to try and get him back to assisted living - hopefully after the holiday weekend and that this would help him mentally. No reports of chest pain. Unclear his Code status - sounds like he has a living will - not sure about actual DNR.   Past Medical History:  Diagnosis Date  . Bladder tumor    hematuria  . Carotid stenosis, bilateral 08/20/2009   mild  . CHF (congestive heart failure) ([place])   . Coronary artery disease    A. S/P CABG w/ Redo CABG in 1997;  b. 2005 Cath: LIMA->LAD ok, VG->OM ok, VG->RPDA->RPLV ok. Severe native 3VD.  Marland Kitchen DDD (degenerative disc disease), lumbar    s/p fusion 4-5  . Diabetes mellitus    oral med and insulin  . Diabetic peripheral neuropathy (HCC)    feet  . Diastolic dysfunction    a. 05/2011 Echo: EF 55%, Gr1 DD, mild to mod MR, mildly to mod dil LA, mild TR.  . Glaucoma bilateral    eye drop rx  . History of hiatal hernia    denies reflux  . HOH (hard of hearing) no hearing aids  . Hypertension   . Mass of skin of right shoulder 03/12/2017  .  Nocturia   . Prostate cancer [facility]) 1996   s/p external radiation  . Renal insufficiency hx  . Syncope 06/06/2011   In context of dehydration and bradycardia.  . Thrombocytopenia ([place]) 03/12/2017    Past Surgical History:  Procedure Laterality Date  . CARDIAC CATHETERIZATION  last cath 2005   per dr Olevia Perches- w/ chart (grafts patent)-  results w/ chart  . CATARACT EXTRACTION W/ INTRAOCULAR LENS  IMPLANT, BILATERAL    . CATARACT EXTRACTION W/PHACO Bilateral    Dr. Gershon Crane  . CORONARY ARTERY BYPASS GRAFT  1984   4 vessels  . CORONARY ARTERY BYPASS GRAFT  1997- redo   3 vessels  . LIPOMA EXCISION  1970's   from back  . LUMBAR FUSION  01/2004   L 4-5  . TRANSURETHRAL RESECTION OF BLADDER TUMOR  04/16/2011   Procedure:  TRANSURETHRAL RESECTION OF BLADDER TUMOR (TURBT);  Surgeon: Claybon Jabs;  Location: [place];  Service: Urology;  Laterality: N/A;  . UPPER GASTROINTESTINAL ENDOSCOPY     dx hiatal hernia     Medications: Current Meds  Medication Sig  . aspirin 81 MG EC tablet Take 81 mg by mouth daily.  Marland Kitchen aspirin 81 MG tablet Take 81 mg by mouth daily.   . B-D INSULIN SYRINGE 29G X 1/2" 1 ML MISC   . diclofenac sodium (VOLTAREN) 1 % GEL Apply 2 g topically 2 (two) times daily as needed.  . diclofenac Sodium (VOLTAREN) 1 % GEL   . feeding supplement, ENSURE ENLIVE, (ENSURE ENLIVE) LIQD Take 237 mLs by mouth at bedtime.  . Insulin Glargine (BASAGLAR KWIKPEN) 100 UNIT/ML SOPN Inject 9 Units into the skin daily.  . insulin lispro (HUMALOG) 100 UNIT/ML injection Inject 0-7 Units into the skin 3 (three) times daily before meals. 0-180 0 units 181-240 3 units 241-300 4 units 301-360 5 units 361-420 6 units 421+ 7 units  . Insulin Pen Needle 32G X 4 MM MISC As directed per insulin dose and sliding scale  . isosorbide mononitrate (IMDUR) 30 MG 24 hr tablet Take 1 tablet (30 mg total) by mouth daily.  Marland Kitchen latanoprost (XALATAN) 0.005 % ophthalmic solution Place 1 drop into both eyes at bedtime.  . potassium chloride SA (KLOR-CON) 20 MEQ tablet Take 1 tablet (20 mEq total) by mouth daily.  . Prodigy Lancets 28G MISC   . Tamsulosin HCl (FLOMAX) 0.4 MG CAPS Take 0.4 mg by mouth daily.  . vitamin B-12 1000 MCG tablet Take 1 tablet (1,000 mcg total) by mouth daily.  . [DISCONTINUED] amLODipine (NORVASC) 5 MG tablet Take 1 tablet (5 mg total) by mouth 2 (two) times daily.  . [DISCONTINUED] furosemide (LASIX) 40 MG tablet Take 1 tablet (40 mg total) by mouth daily.  . [DISCONTINUED] losartan (COZAAR) 100 MG tablet Take 1 tablet (100 mg total) by mouth daily. Overdue for annual appt w/labs must see MD for refills     Allergies: Allergies  Allergen Reactions  . Codeine Nausea And Vomiting and  Other (See Comments)    hallucinations  . Microzide [Hydrochlorothiazide] Other (See Comments)    Acute gout on LosartanHCT 12/2014  . Ace Inhibitors Other (See Comments)     cough    Social History: The patient  reports that he has never smoked. He has never used smokeless tobacco. He reports that he does not drink alcohol and does not use drugs.   Family History: The patient's family history includes Diabetes in his sister.   Review of  Systems: Please see the history of present illness.   All other systems are reviewed and negative.   Physical Exam: VS:  BP (!) 80/60   Pulse (!) 32   Ht 5\' 4"  (1.626 m)   Wt 157 lb (71.2 kg) Comment: per son in law  SpO2 94%   BMI 26.95 kg/m  .  BMI Body mass index is 26.95 kg/m.  Wt Readings from Last 3 Encounters:  12/05/19 157 lb (71.2 kg)  11/20/19 128 lb (58.1 kg)  06/26/19 138 lb (62.6 kg)   BP is 80/60 in both arms by me today.  Blood sugar of 225. Temperature of 98.2  He was not able to stand to weigh.   General: Quite sedate/lethargic during the initial visit - he did perk up more towards the end. He does not appear to be in any acute distress. He is in a wheelchair. He looks rather thin to me.  Oral mucosa quite dry.  Cardiac: Fairly regular. No edema.  Respiratory:  Lungs are fairly clear to auscultation bilaterally with normal work of breathing.  MS: No deformity or atrophy. Gait not tested.  Skin: Warm and dry. Color is normal.  Neuro:  Strength and sensation are intact and no gross focal deficits noted.  Psych: Alert, appropriate and with normal affect.   LABORATORY DATA:  EKG:  EKG is not ordered today.  Personally reviewed by me. This demonstrates junctional rhythm - HR is 70 bpm.  Lab Results  Component Value Date   WBC 5.3 11/15/2019   HGB 14.7 11/15/2019   HCT 43.5 11/15/2019   PLT 153 11/15/2019   GLUCOSE 136 (H) 11/17/2019   CHOL 136 03/11/2017   TRIG 98 06/26/2019   HDL 46 03/11/2017   LDLCALC 73  03/11/2017   ALT 20 11/13/2019   AST 17 11/13/2019   NA 137 11/17/2019   K 3.7 11/17/2019   CL 105 11/17/2019   CREATININE 1.30 (H) 11/20/2019   BUN 25 (H) 11/17/2019   CO2 21 (L) 11/17/2019   TSH 1.954 11/13/2019   PSA 1.53 02/28/2008   INR 1.05 04/28/2011   HGBA1C 8.7 (H) 06/27/2019   MICROALBUR 2.0 (H) 02/28/2008     BNP (last 3 results) Recent Labs    06/30/19 0621 11/12/19 1630 11/14/19 1032  BNP 165.6* 447.9* 311.7*    ProBNP (last 3 results) No results for input(s): PROBNP in the last 8760 hours.   Other Studies Reviewed Today:  ECHO IMPRESSIONS 11/2019  1. Left ventricular ejection fraction, by estimation, is 40 to 45%. The  left ventricle has mildly decreased function. There is mild left  ventricular hypertrophy. Left ventricular diastolic parameters are  indeterminate.  2. Right ventricular systolic function is moderately reduced. The right  ventricular size is normal. Tricuspid regurgitation signal is inadequate  for assessing PA pressure.  3. Left atrial size was moderately dilated.  4. Right atrial size was mildly dilated.  5. The mitral valve is degenerative. Mild mitral valve regurgitation.  6. The aortic valve was not well visualized. Aortic valve regurgitation  is not visualized. Mild aortic valve sclerosis is present, with no  evidence of aortic valve stenosis.  7. Aortic dilatation noted. There is dilatation of the ascending aorta  measuring 39 mm.  8. The inferior vena cava is dilated in size with >50% respiratory  variability, suggesting right atrial pressure of 8 mmHg.      ASSESSMENT AND PLAN:  1. Recent admission for acute on chronic systolic HF/ICM -  looks quite dehydrated to me today - he is hypotensive - very tenuous situation. Had daughter on phone to join some of the conversation. We will stop Norvasc, Hold Lasix, cut Losartan to 25 mg starting on Friday. He needs labs today. We need to clarify code status - family is to  check on this. I did mention Hospice/Palliative care - they are not quite ready for this at this time. He will need close follow up to see when/if diuretics need to be resumed and to recheck BP.   2. Prior CHB/junctional rhythm - HR is ok - no indication for PPM at this time. He is not on any AV nodal blocking agents.   3. Prior pancytopenia - recheck lab today.   4. DM - per PCP  5. Metabolic encephalopathy - suspect this is multifactorial - remains out of his environment.   6. HTN - very hypotensive today - suspect he is over diuresed - see #1 for med changes made today. With his age, I would prefer much higher BP. This may help his mentation better.   7. CAD - prior CABG with redo - last cath in 2015 - patent LIMA to LAD and patent AV to PD and OM - favor conservative management.   8. HLD - no longer on statin  9. Advanced age - limited options - would favor conservative measures with goal of safety going forward.    Current medicines are reviewed with the patient today.  The patient does not have concerns regarding medicines other than what has been noted above.  The following changes have been made:  See above.  Labs/ tests ordered today include:    Orders Placed This Encounter  Procedures  . Basic metabolic panel  . CBC  . EKG 12-Lead     Disposition:   FU with our team in one week.   Patient is agreeable to this plan and will call if any problems develop in the interim.   SignedTruitt Merle, NP  12/05/2019 12:26 PM  [facility] [address] [place], [place]  [address] Phone: [phone] Fax: [phone]

## 2019-12-04 ENCOUNTER — Ambulatory Visit: Payer: Medicare Other | Admitting: Sports Medicine

## 2019-12-05 ENCOUNTER — Encounter: Payer: Self-pay | Admitting: Nurse Practitioner

## 2019-12-05 ENCOUNTER — Telehealth: Payer: Self-pay | Admitting: *Deleted

## 2019-12-05 ENCOUNTER — Other Ambulatory Visit: Payer: Self-pay

## 2019-12-05 ENCOUNTER — Ambulatory Visit (INDEPENDENT_AMBULATORY_CARE_PROVIDER_SITE_OTHER): Payer: Medicare Other | Admitting: Nurse Practitioner

## 2019-12-05 VITALS — BP 80/60 | HR 32 | Ht 64.0 in

## 2019-12-05 DIAGNOSIS — I5022 Chronic systolic (congestive) heart failure: Secondary | ICD-10-CM

## 2019-12-05 DIAGNOSIS — I952 Hypotension due to drugs: Secondary | ICD-10-CM | POA: Diagnosis not present

## 2019-12-05 LAB — BASIC METABOLIC PANEL
BUN/Creatinine Ratio: 15 (ref 10–24)
BUN: 24 mg/dL (ref 10–36)
CO2: 21 mmol/L (ref 20–29)
Calcium: 8.6 mg/dL (ref 8.6–10.2)
Chloride: 101 mmol/L (ref 96–106)
Creatinine, Ser: 1.6 mg/dL — ABNORMAL HIGH (ref 0.76–1.27)
GFR calc Af Amer: 41 mL/min/{1.73_m2} — ABNORMAL LOW (ref >59)
GFR calc non Af Amer: 36 mL/min/{1.73_m2} — ABNORMAL LOW (ref >59)
Glucose: 225 mg/dL — ABNORMAL HIGH (ref 65–99)
Potassium: 4.6 mmol/L (ref 3.5–5.2)
Sodium: 138 mmol/L (ref 134–144)

## 2019-12-05 LAB — CBC
Hematocrit: 39.7 % (ref 37.5–51.0)
Hemoglobin: 13.2 g/dL (ref 13.0–17.7)
MCH: 28.4 pg (ref 26.6–33.0)
MCHC: 33.2 g/dL (ref 31.5–35.7)
MCV: 85 fL (ref 79–97)
Platelets: 137 10*3/uL — ABNORMAL LOW (ref 150–450)
RBC: 4.65 x10E6/uL (ref 4.14–5.80)
RDW: 13.7 % (ref 11.6–15.4)
WBC: 3.6 10*3/uL (ref 3.4–10.8)

## 2019-12-05 MED ORDER — LOSARTAN POTASSIUM 25 MG PO TABS: 25.0000 mg | ORAL_TABLET | Freq: Every day | ORAL | 3 refills | Status: DC

## 2019-12-05 NOTE — Patient Instructions (Addendum)
After Visit Summary:  We will be checking the following labs today - BMET & CBC   Medication Instructions:    Continue with your current medicines. BUT  STOP AMLODIPINE  HOLD LASIX FOR NOW  CUT LOSARTAN TO JUST 25 MG - STARTING ON FRIDAY   If you need a refill on your cardiac medications before your next appointment, please call your pharmacy.     Testing/Procedures To Be Arranged:  N/A  Follow-Up:   Needs one week follow up   Needs daily BP check.     At [facility], you and your health needs are our priority.  As part of our continuing mission to provide you with exceptional heart care, we have created designated Provider Care Teams.  These Care Teams include your primary Cardiologist (physician) and Advanced Practice Providers (APPs -  Physician Assistants and Nurse Practitioners) who all work together to provide you with the care you need, when you need it.  Special Instructions:  . Stay safe, wash your hands for at least 20 seconds and wear a mask when needed.  . It was good to talk with you both today.    Call the [place] office at [phone] if you have any questions, problems or concerns.

## 2019-12-05 NOTE — Telephone Encounter (Signed)
[facility] @ [phone]  Fax # [phone] will fax ov note from todays ov with Truitt Merle, NP.

## 2019-12-10 NOTE — Progress Notes (Signed)
CARDIOLOGY OFFICE NOTE  Date:  12/10/2019    Leonard [name] Date of Birth: 1920/12/22 Medical Record #[id number]  PCP:  Leonard Koch, MD  Cardiologist:  Leonard [name]    No chief complaint on file.   History of Present Illness: Leonard [name] is a 86 y.o. male who presents today for a post hospital visit. I have not seen in 3 years Seen by Dr Harrington Challenger in hospital 11/13/19   He has a history of known CAD status post CABG with redo in 1997 - last cath in 2005 with patent LIMA and patent SVG to OM and PD, ICM with last EF measured in 2018 was 45 to 50% Myovue 03/12/17 small area of inferior lateral ischemia Rx medically   Echo 11/13/19 EF 40-45% mild MR AV sclerosis   Admitted 11/13/19  with CHF - noted conduction disease on EKG -  No significant bradycardia or pauses. No worsening AV block. There were no plans for PPM. Treated with low dose Lasix after REDS vest reading a little elevated. Stay was complicated by confusion/sun downing - discharged back to SNF.   Seen by NP 12/05/19 accompanied by son in law who is a physician/anesthesiologist - Leonard Needles, MD.   Currently at [facility] and [facility]. Mental status has continued to be an issue . Wife of 16 years has passed recently. During visit was lethargic and low BP 80 mmHg systolic Lasix held and Losartin decreased to 25 mg daily. CT head negative during hospital stay 11/13/19   BP much better and not postural today Able to stand and doing some activity at [facility] place   Past Medical History:  Diagnosis Date  . Bladder tumor    hematuria  . Carotid stenosis, bilateral 08/20/2009   mild  . CHF (congestive heart failure) ([place])   . Coronary artery disease    A. S/P CABG w/ Redo CABG in 1997;  b. 2005 Cath: LIMA->LAD ok, VG->OM ok, VG->RPDA->RPLV ok. Severe native 3VD.  Marland Kitchen DDD (degenerative disc disease), lumbar    s/p fusion 4-5  . Diabetes mellitus    oral med and insulin  . Diabetic peripheral neuropathy (HCC)    feet    . Diastolic dysfunction    a. 05/2011 Echo: EF 55%, Gr1 DD, mild to mod MR, mildly to mod dil LA, mild TR.  . Glaucoma bilateral    eye drop rx  . History of hiatal hernia    denies reflux  . HOH (hard of hearing) no hearing aids  . Hypertension   . Mass of skin of right shoulder 03/12/2017  . Nocturia   . Prostate cancer [facility]) 1996   s/p external radiation  . Renal insufficiency hx  . Syncope 06/06/2011   In context of dehydration and bradycardia.  . Thrombocytopenia ([place]) 03/12/2017    Past Surgical History:  Procedure Laterality Date  . CARDIAC CATHETERIZATION  last cath 2005   per dr Olevia Perches- w/ chart (grafts patent)-  results w/ chart  . CATARACT EXTRACTION W/ INTRAOCULAR LENS  IMPLANT, BILATERAL    . CATARACT EXTRACTION W/PHACO Bilateral    Dr. Gershon Crane  . CORONARY ARTERY BYPASS GRAFT  1984   4 vessels  . CORONARY ARTERY BYPASS GRAFT  1997- redo   3 vessels  . LIPOMA EXCISION  1970's   from back  . LUMBAR FUSION  01/2004   L 4-5  . TRANSURETHRAL RESECTION OF BLADDER TUMOR  04/16/2011   Procedure: TRANSURETHRAL RESECTION OF  BLADDER TUMOR (TURBT);  Surgeon: Claybon Jabs;  Location: [facility];  Service: Urology;  Laterality: N/A;  . UPPER GASTROINTESTINAL ENDOSCOPY     dx hiatal hernia     Medications: No outpatient medications have been marked as taking for the 12/11/19 encounter (Appointment) with Josue Hector, MD.     Allergies: Allergies  Allergen Reactions  . Codeine Nausea And Vomiting and Other (See Comments)    hallucinations  . Microzide [Hydrochlorothiazide] Other (See Comments)    Acute gout on LosartanHCT 12/2014  . Ace Inhibitors Other (See Comments)     cough    Social History: The patient  reports that he has never smoked. He has never used smokeless tobacco. He reports that he does not drink alcohol and does not use drugs.   Family History: The patient's family history includes Diabetes in his sister.   Review of  Systems: Please see the history of present illness.   All other systems are reviewed and negative.   Physical Exam: VS:  There were no vitals taken for this visit. Marland Kitchen  BMI There is no height or weight on file to calculate BMI.  Wt Readings from Last 3 Encounters:  11/20/19 128 lb (58.1 kg)  06/26/19 138 lb (62.6 kg)  07/19/17 150 lb (68 kg)   Affect appropriate Frail elderly male  HEENT: normal Neck supple with no adenopathy JVP normal no bruits no thyromegaly Lungs clear with no wheezing and good diaphragmatic motion Heart:  S1/S2 no murmur, no rub, gallop or click PMI normal Abdomen: benighn, BS positve, no tenderness, no AAA no bruit.  No HSM or HJR Distal pulses intact with no bruits No edema Neuro non-focal Skin warm and dry No muscular weakness    LABORATORY DATA:  EKG:  EKG is not ordered today.  Personally reviewed by me. This demonstrates junctional rhythm - HR is 70 bpm.  Lab Results  Component Value Date   WBC 3.6 12/05/2019   HGB 13.2 12/05/2019   HCT 39.7 12/05/2019   PLT 137 (L) 12/05/2019   GLUCOSE 225 (H) 12/05/2019   CHOL 136 03/11/2017   TRIG 98 06/26/2019   HDL 46 03/11/2017   LDLCALC 73 03/11/2017   ALT 20 11/13/2019   AST 17 11/13/2019   NA 138 12/05/2019   K 4.6 12/05/2019   CL 101 12/05/2019   CREATININE 1.60 (H) 12/05/2019   BUN 24 12/05/2019   CO2 21 12/05/2019   TSH 1.954 11/13/2019   PSA 1.53 02/28/2008   INR 1.05 04/28/2011   HGBA1C 8.7 (H) 06/27/2019   MICROALBUR 2.0 (H) 02/28/2008     BNP (last 3 results) Recent Labs    06/30/19 0621 11/12/19 1630 11/14/19 1032  BNP 165.6* 447.9* 311.7*    ProBNP (last 3 results) No results for input(s): PROBNP in the last 8760 hours.   Other Studies Reviewed Today:  ECHO IMPRESSIONS 11/2019  1. Left ventricular ejection fraction, by estimation, is 40 to 45%. The  left ventricle has mildly decreased function. There is mild left  ventricular hypertrophy. Left ventricular  diastolic parameters are  indeterminate.  2. Right ventricular systolic function is moderately reduced. The right  ventricular size is normal. Tricuspid regurgitation signal is inadequate  for assessing PA pressure.  3. Left atrial size was moderately dilated.  4. Right atrial size was mildly dilated.  5. The mitral valve is degenerative. Mild mitral valve regurgitation.  6. The aortic valve was not well visualized. Aortic valve regurgitation  is not visualized. Mild aortic valve sclerosis is present, with no  evidence of aortic valve stenosis.  7. Aortic dilatation noted. There is dilatation of the ascending aorta  measuring 39 mm.  8. The inferior vena cava is dilated in size with >50% respiratory  variability, suggesting right atrial pressure of 8 mmHg.      ASSESSMENT AND PLAN:  1. Recent admission for acute on chronic systolic HF/ICM 2/37/62  - seen by NP 12/05/19 dehydrated lasix held losartan decreased to 25 mg daily 12/05/19 BUN 24 CR 1/6 K 4.6 Will Repeat labs today   2. Prior CHB/junctional rhythm - HR is ok - no indication for PPM at this time. He is not on any AV nodal blocking agents.   3. Prior pancytopenia - recheck lab today.   4. DM - per PCP  5. Metabolic encephalopathy - suspect this is multifactorial - remains out of his environment.   6. HTN - hypotensive with Rx CHF improved on lower dose meds    7. CAD - prior CABG with redo - last cath in 2015 - patent LIMA to LAD and patent AV to PD and OM - favor conservative management.   8. HLD - no longer on statin  9. Advanced age - limited options - palliative / hospice candidate per primary    Current medicines are reviewed with the patient today.  The patient does not have concerns regarding medicines other than what has been noted above.  The following changes have been made:  See above.  Labs/ tests ordered today include:    No orders of the defined types were placed in this  encounter.    Disposition:   FU  With NP 3 months and me in 6 months   Patient is agreeable to this plan and will call if any problems develop in the interim.   Signed: Jenkins Rouge, MD  12/10/2019 11:40 AM  [facility] [address] [place], [place]  [address] Phone: [phone] Fax: [phone]

## 2019-12-11 ENCOUNTER — Other Ambulatory Visit: Payer: Self-pay

## 2019-12-11 ENCOUNTER — Encounter: Payer: Self-pay | Admitting: Cardiovascular Disease

## 2019-12-11 ENCOUNTER — Ambulatory Visit (INDEPENDENT_AMBULATORY_CARE_PROVIDER_SITE_OTHER): Payer: Medicare Other | Admitting: Cardiovascular Disease

## 2019-12-11 VITALS — BP 146/62 | HR 61 | Ht 64.0 in | Wt 137.0 lb

## 2019-12-11 DIAGNOSIS — I5023 Acute on chronic systolic (congestive) heart failure: Secondary | ICD-10-CM

## 2019-12-11 DIAGNOSIS — Z862 Personal history of diseases of the blood and blood-forming organs and certain disorders involving the immune mechanism: Secondary | ICD-10-CM | POA: Diagnosis not present

## 2019-12-11 DIAGNOSIS — G9341 Metabolic encephalopathy: Secondary | ICD-10-CM

## 2019-12-11 NOTE — Patient Instructions (Addendum)
Medication Instructions:  *If you need a refill on your cardiac medications before your next appointment, please call your pharmacy*  Lab Work: Your physician recommends that you have lab work today- BMET and BNP  If you have labs (blood work) drawn today and your tests are completely normal, you will receive your results only by: Marland Kitchen MyChart Message (if you have MyChart) OR . A paper copy in the mail If you have any lab test that is abnormal or we need to change your treatment, we will call you to review the results.  Testing/Procedures: None ordered today.  Follow-Up: At [facility], you and your health needs are our priority.  As part of our continuing mission to provide you with exceptional heart care, we have created designated Provider Care Teams.  These Care Teams include your primary Cardiologist (physician) and Advanced Practice Providers (APPs -  Physician Assistants and Nurse Practitioners) who all work together to provide you with the care you need, when you need it.  We recommend signing up for the patient portal called "MyChart".  Sign up information is provided on this After Visit Summary.  MyChart is used to connect with patients for Virtual Visits (Telemedicine).  Patients are able to view lab/test results, encounter notes, upcoming appointments, etc.  Non-urgent messages can be sent to your provider as well.   To learn more about what you can do with MyChart, go to [email or web address].    Your next appointment:   3 month(s)  The format for your next appointment:   In Person  Provider:   You may see one of the following Advanced Practice Providers on your designated Care Team:    Truitt Merle, NP  Cecilie Kicks, NP  Kathyrn Drown, NP  Your physician wants you to follow-up in: 6 months with Dr. Johnsie Cancel. You will receive a reminder letter in the mail two months in advance. If you don't receive a letter, please call our office to schedule the follow-up  appointment.

## 2019-12-12 LAB — BASIC METABOLIC PANEL
BUN/Creatinine Ratio: 14 (ref 10–24)
BUN: 18 mg/dL (ref 10–36)
CO2: 22 mmol/L (ref 20–29)
Calcium: 8.9 mg/dL (ref 8.6–10.2)
Chloride: 100 mmol/L (ref 96–106)
Creatinine, Ser: 1.3 mg/dL — ABNORMAL HIGH (ref 0.76–1.27)
GFR calc Af Amer: 53 mL/min/{1.73_m2} — ABNORMAL LOW (ref >59)
GFR calc non Af Amer: 46 mL/min/{1.73_m2} — ABNORMAL LOW (ref >59)
Glucose: 232 mg/dL — ABNORMAL HIGH (ref 65–99)
Potassium: 4.4 mmol/L (ref 3.5–5.2)
Sodium: 137 mmol/L (ref 134–144)

## 2019-12-12 LAB — PRO B NATRIURETIC PEPTIDE: NT-Pro BNP: 4030 pg/mL — ABNORMAL HIGH (ref 0–486)

## 2019-12-13 ENCOUNTER — Telehealth: Payer: Self-pay

## 2019-12-13 MED ORDER — FUROSEMIDE 20 MG PO TABS
ORAL_TABLET | ORAL | 3 refills | Status: DC
Start: 2019-12-13 — End: 2020-01-01

## 2019-12-13 NOTE — Telephone Encounter (Signed)
-----   Message from Josue Hector, MD sent at 12/12/2019 12:25 PM EDT ----- Have him take 20 mg alternating with 40 mg daily f/u labs BMET/BNP in 4 weeks ----- Message ----- From: Michaelyn Barter, RN Sent: 12/12/2019  12:19 PM EDT To: Josue Hector, MD  Called patient's daughter about patient's lab work. She stated patient is suppose to be taking lasix 20 mg daily. Patient is moving from [place] to [place] later today. Called [facility] and talked to patient's nurse, she confirmed patient is taking Lasix 20 mg by mouth daily.  Will forward to Dr. Johnsie Cancel.

## 2019-12-13 NOTE — Telephone Encounter (Signed)
Called [facility] and talked to patient's nurse. Will send fax [phone]) of order to change Lasix to 20 mg by mouth every other day and alternating with 40 mg on other days. Patient will need a BMET and BNP in 4 weeks. Will have them fax results to [phone].

## 2019-12-27 ENCOUNTER — Emergency Department (HOSPITAL_COMMUNITY): Payer: Medicare Other

## 2019-12-27 ENCOUNTER — Inpatient Hospital Stay (HOSPITAL_COMMUNITY)
Admission: EM | Admit: 2019-12-27 | Discharge: 2020-01-01 | DRG: 291 | Disposition: A | Discharge status: Hospice | Payer: Medicare Other | Attending: Family Medicine | Admitting: Family Medicine

## 2019-12-27 ENCOUNTER — Encounter (HOSPITAL_COMMUNITY): Payer: Self-pay

## 2019-12-27 ENCOUNTER — Other Ambulatory Visit: Payer: Self-pay

## 2019-12-27 DIAGNOSIS — I452 Bifascicular block: Secondary | ICD-10-CM | POA: Diagnosis present

## 2019-12-27 DIAGNOSIS — Z7189 Other specified counseling: Secondary | ICD-10-CM | POA: Diagnosis not present

## 2019-12-27 DIAGNOSIS — R0602 Shortness of breath: Secondary | ICD-10-CM | POA: Diagnosis present

## 2019-12-27 DIAGNOSIS — E1165 Type 2 diabetes mellitus with hyperglycemia: Secondary | ICD-10-CM | POA: Diagnosis present

## 2019-12-27 DIAGNOSIS — I5023 Acute on chronic systolic (congestive) heart failure: Secondary | ICD-10-CM | POA: Diagnosis present

## 2019-12-27 DIAGNOSIS — Z79899 Other long term (current) drug therapy: Secondary | ICD-10-CM

## 2019-12-27 DIAGNOSIS — I2510 Atherosclerotic heart disease of native coronary artery without angina pectoris: Secondary | ICD-10-CM | POA: Diagnosis present

## 2019-12-27 DIAGNOSIS — R7989 Other specified abnormal findings of blood chemistry: Secondary | ICD-10-CM | POA: Diagnosis present

## 2019-12-27 DIAGNOSIS — Z8546 Personal history of malignant neoplasm of prostate: Secondary | ICD-10-CM

## 2019-12-27 DIAGNOSIS — Z9841 Cataract extraction status, right eye: Secondary | ICD-10-CM

## 2019-12-27 DIAGNOSIS — Z87440 Personal history of urinary (tract) infections: Secondary | ICD-10-CM

## 2019-12-27 DIAGNOSIS — E785 Hyperlipidemia, unspecified: Secondary | ICD-10-CM | POA: Diagnosis present

## 2019-12-27 DIAGNOSIS — Z794 Long term (current) use of insulin: Secondary | ICD-10-CM

## 2019-12-27 DIAGNOSIS — D696 Thrombocytopenia, unspecified: Secondary | ICD-10-CM | POA: Diagnosis present

## 2019-12-27 DIAGNOSIS — K761 Chronic passive congestion of liver: Secondary | ICD-10-CM | POA: Diagnosis present

## 2019-12-27 DIAGNOSIS — Z66 Do not resuscitate: Secondary | ICD-10-CM | POA: Diagnosis present

## 2019-12-27 DIAGNOSIS — D72829 Elevated white blood cell count, unspecified: Secondary | ICD-10-CM | POA: Diagnosis present

## 2019-12-27 DIAGNOSIS — I10 Essential (primary) hypertension: Secondary | ICD-10-CM | POA: Diagnosis not present

## 2019-12-27 DIAGNOSIS — J9601 Acute respiratory failure with hypoxia: Secondary | ICD-10-CM | POA: Diagnosis present

## 2019-12-27 DIAGNOSIS — Z8701 Personal history of pneumonia (recurrent): Secondary | ICD-10-CM | POA: Diagnosis not present

## 2019-12-27 DIAGNOSIS — E860 Dehydration: Secondary | ICD-10-CM | POA: Diagnosis present

## 2019-12-27 DIAGNOSIS — E11649 Type 2 diabetes mellitus with hypoglycemia without coma: Secondary | ICD-10-CM | POA: Diagnosis not present

## 2019-12-27 DIAGNOSIS — E1122 Type 2 diabetes mellitus with diabetic chronic kidney disease: Secondary | ICD-10-CM | POA: Diagnosis present

## 2019-12-27 DIAGNOSIS — Z9842 Cataract extraction status, left eye: Secondary | ICD-10-CM

## 2019-12-27 DIAGNOSIS — Z7982 Long term (current) use of aspirin: Secondary | ICD-10-CM

## 2019-12-27 DIAGNOSIS — E1142 Type 2 diabetes mellitus with diabetic polyneuropathy: Secondary | ICD-10-CM | POA: Diagnosis present

## 2019-12-27 DIAGNOSIS — I493 Ventricular premature depolarization: Secondary | ICD-10-CM | POA: Diagnosis present

## 2019-12-27 DIAGNOSIS — I130 Hypertensive heart and chronic kidney disease with heart failure and stage 1 through stage 4 chronic kidney disease, or unspecified chronic kidney disease: Principal | ICD-10-CM | POA: Diagnosis present

## 2019-12-27 DIAGNOSIS — Z951 Presence of aortocoronary bypass graft: Secondary | ICD-10-CM | POA: Diagnosis not present

## 2019-12-27 DIAGNOSIS — Z888 Allergy status to other drugs, medicaments and biological substances status: Secondary | ICD-10-CM

## 2019-12-27 DIAGNOSIS — I255 Ischemic cardiomyopathy: Secondary | ICD-10-CM | POA: Diagnosis present

## 2019-12-27 DIAGNOSIS — H9190 Unspecified hearing loss, unspecified ear: Secondary | ICD-10-CM | POA: Diagnosis present

## 2019-12-27 DIAGNOSIS — Z981 Arthrodesis status: Secondary | ICD-10-CM

## 2019-12-27 DIAGNOSIS — Z515 Encounter for palliative care: Secondary | ICD-10-CM | POA: Diagnosis not present

## 2019-12-27 DIAGNOSIS — Z923 Personal history of irradiation: Secondary | ICD-10-CM

## 2019-12-27 DIAGNOSIS — H409 Unspecified glaucoma: Secondary | ICD-10-CM | POA: Diagnosis present

## 2019-12-27 DIAGNOSIS — I5043 Acute on chronic combined systolic (congestive) and diastolic (congestive) heart failure: Secondary | ICD-10-CM

## 2019-12-27 DIAGNOSIS — Z8616 Personal history of COVID-19: Secondary | ICD-10-CM

## 2019-12-27 DIAGNOSIS — N179 Acute kidney failure, unspecified: Secondary | ICD-10-CM | POA: Diagnosis present

## 2019-12-27 DIAGNOSIS — R531 Weakness: Secondary | ICD-10-CM | POA: Diagnosis not present

## 2019-12-27 DIAGNOSIS — Z20822 Contact with and (suspected) exposure to covid-19: Secondary | ICD-10-CM | POA: Diagnosis present

## 2019-12-27 DIAGNOSIS — Z961 Presence of intraocular lens: Secondary | ICD-10-CM | POA: Diagnosis present

## 2019-12-27 DIAGNOSIS — I440 Atrioventricular block, first degree: Secondary | ICD-10-CM | POA: Diagnosis present

## 2019-12-27 DIAGNOSIS — I509 Heart failure, unspecified: Secondary | ICD-10-CM

## 2019-12-27 DIAGNOSIS — F419 Anxiety disorder, unspecified: Secondary | ICD-10-CM | POA: Diagnosis present

## 2019-12-27 DIAGNOSIS — I5021 Acute systolic (congestive) heart failure: Secondary | ICD-10-CM

## 2019-12-27 DIAGNOSIS — Z833 Family history of diabetes mellitus: Secondary | ICD-10-CM

## 2019-12-27 DIAGNOSIS — N184 Chronic kidney disease, stage 4 (severe): Secondary | ICD-10-CM | POA: Diagnosis present

## 2019-12-27 DIAGNOSIS — Z885 Allergy status to narcotic agent status: Secondary | ICD-10-CM

## 2019-12-27 HISTORY — DX: Chronic systolic (congestive) heart failure: I50.22

## 2019-12-27 HISTORY — DX: Thoracic aortic ectasia: I77.810

## 2019-12-27 HISTORY — DX: Chronic kidney disease, stage 3 unspecified: N18.30

## 2019-12-27 HISTORY — DX: Conduction disorder, unspecified: I45.9

## 2019-12-27 HISTORY — DX: Other pancytopenia: D61.818

## 2019-12-27 LAB — COMPREHENSIVE METABOLIC PANEL
ALT: 57 U/L — ABNORMAL HIGH (ref 0–44)
AST: 80 U/L — ABNORMAL HIGH (ref 15–41)
Albumin: 3.8 g/dL (ref 3.5–5.0)
Alkaline Phosphatase: 140 U/L — ABNORMAL HIGH (ref 38–126)
Anion gap: 13 (ref 5–15)
BUN: 30 mg/dL — ABNORMAL HIGH (ref 8–23)
CO2: 22 mmol/L (ref 22–32)
Calcium: 8.4 mg/dL — ABNORMAL LOW (ref 8.9–10.3)
Chloride: 102 mmol/L (ref 98–111)
Creatinine, Ser: 1.57 mg/dL — ABNORMAL HIGH (ref 0.61–1.24)
GFR calc Af Amer: 42 mL/min — ABNORMAL LOW (ref >60)
GFR calc non Af Amer: 36 mL/min — ABNORMAL LOW (ref >60)
Glucose, Bld: 226 mg/dL — ABNORMAL HIGH (ref 70–99)
Potassium: 5.1 mmol/L (ref 3.5–5.1)
Sodium: 137 mmol/L (ref 135–145)
Total Bilirubin: 0.7 mg/dL (ref 0.3–1.2)
Total Protein: 6.7 g/dL (ref 6.5–8.1)

## 2019-12-27 LAB — MRSA PCR SCREENING: MRSA by PCR: NEGATIVE

## 2019-12-27 LAB — URINALYSIS, ROUTINE W REFLEX MICROSCOPIC
Bacteria, UA: NONE SEEN
Bilirubin Urine: NEGATIVE
Glucose, UA: NEGATIVE mg/dL
Hgb urine dipstick: NEGATIVE
Ketones, ur: NEGATIVE mg/dL
Leukocytes,Ua: NEGATIVE
Nitrite: NEGATIVE
Protein, ur: 100 mg/dL — AB
Specific Gravity, Urine: 1.011 (ref 1.005–1.030)
pH: 5 (ref 5.0–8.0)

## 2019-12-27 LAB — CBC WITH DIFFERENTIAL/PLATELET
Abs Immature Granulocytes: 0.01 10*3/uL (ref 0.00–0.07)
Basophils Absolute: 0 10*3/uL (ref 0.0–0.1)
Basophils Relative: 1 %
Eosinophils Absolute: 0.1 10*3/uL (ref 0.0–0.5)
Eosinophils Relative: 2 %
HCT: 40.3 % (ref 39.0–52.0)
Hemoglobin: 13.1 g/dL (ref 13.0–17.0)
Immature Granulocytes: 0 %
Lymphocytes Relative: 46 %
Lymphs Abs: 2.2 10*3/uL (ref 0.7–4.0)
MCH: 28.9 pg (ref 26.0–34.0)
MCHC: 32.5 g/dL (ref 30.0–36.0)
MCV: 88.8 fL (ref 80.0–100.0)
Monocytes Absolute: 0.5 10*3/uL (ref 0.1–1.0)
Monocytes Relative: 10 %
Neutro Abs: 2 10*3/uL (ref 1.7–7.7)
Neutrophils Relative %: 41 %
Platelets: 126 10*3/uL — ABNORMAL LOW (ref 150–400)
RBC: 4.54 MIL/uL (ref 4.22–5.81)
RDW: 15.5 % (ref 11.5–15.5)
WBC: 4.8 10*3/uL (ref 4.0–10.5)
nRBC: 0 % (ref 0.0–0.2)

## 2019-12-27 LAB — GLUCOSE, CAPILLARY
Glucose-Capillary: 242 mg/dL — ABNORMAL HIGH (ref 70–99)
Glucose-Capillary: 317 mg/dL — ABNORMAL HIGH (ref 70–99)

## 2019-12-27 LAB — SARS CORONAVIRUS 2 BY RT PCR (HOSPITAL ORDER, PERFORMED IN ~~LOC~~ HOSPITAL LAB): SARS Coronavirus 2: NEGATIVE

## 2019-12-27 LAB — BRAIN NATRIURETIC PEPTIDE: B Natriuretic Peptide: 1367.9 pg/mL — ABNORMAL HIGH (ref 0.0–100.0)

## 2019-12-27 LAB — LACTIC ACID, PLASMA: Lactic Acid, Venous: 1.2 mmol/L (ref 0.5–1.9)

## 2019-12-27 MED ORDER — CARVEDILOL 3.125 MG PO TABS
3.1250 mg | ORAL_TABLET | Freq: Two times a day (BID) | ORAL | Status: DC
  Administered 2019-12-27 – 2019-12-28 (×2): 3.125 mg via ORAL
  Filled 2019-12-27 (×2): qty 1

## 2019-12-27 MED ORDER — INSULIN DETEMIR 100 UNIT/ML ~~LOC~~ SOLN
10.0000 [IU] | Freq: Every day | SUBCUTANEOUS | Status: DC
  Administered 2019-12-27: 10 [IU] via SUBCUTANEOUS
  Filled 2019-12-27: qty 0.1

## 2019-12-27 MED ORDER — CIPROFLOXACIN HCL 250 MG PO TABS: 250.0000 mg | ORAL_TABLET | Freq: Two times a day (BID) | ORAL | Status: DC

## 2019-12-27 MED ORDER — CIPROFLOXACIN HCL 250 MG PO TABS
250.0000 mg | ORAL_TABLET | Freq: Two times a day (BID) | ORAL | Status: DC
  Administered 2019-12-27: 250 mg via ORAL
  Filled 2019-12-27: qty 1

## 2019-12-27 MED ORDER — ISOSORBIDE MONONITRATE ER 30 MG PO TB24
30.0000 mg | ORAL_TABLET | Freq: Every day | ORAL | Status: DC
  Administered 2019-12-28 – 2019-12-29 (×2): 30 mg via ORAL
  Filled 2019-12-27 (×2): qty 1

## 2019-12-27 MED ORDER — IPRATROPIUM-ALBUTEROL 20-100 MCG/ACT IN AERS: 1.0000 | INHALATION_SPRAY | Freq: Four times a day (QID) | RESPIRATORY_TRACT | Status: DC

## 2019-12-27 MED ORDER — ASPIRIN EC 81 MG PO TBEC
81.0000 mg | DELAYED_RELEASE_TABLET | Freq: Every day | ORAL | Status: DC
  Administered 2019-12-28 – 2019-12-29 (×2): 81 mg via ORAL
  Filled 2019-12-27 (×3): qty 1

## 2019-12-27 MED ORDER — AEROCHAMBER PLUS FLO-VU MEDIUM MISC
1.0000 | Freq: Once | Status: AC
  Administered 2019-12-27: 1
  Filled 2019-12-27: qty 1

## 2019-12-27 MED ORDER — INSULIN ASPART 100 UNIT/ML ~~LOC~~ SOLN
3.0000 [IU] | Freq: Three times a day (TID) | SUBCUTANEOUS | Status: DC
  Administered 2019-12-28 (×2): 3 [IU] via SUBCUTANEOUS

## 2019-12-27 MED ORDER — INSULIN ASPART 100 UNIT/ML ~~LOC~~ SOLN
0.0000 [IU] | Freq: Every day | SUBCUTANEOUS | Status: DC
  Administered 2019-12-27: 4 [IU] via SUBCUTANEOUS

## 2019-12-27 MED ORDER — ALBUTEROL SULFATE HFA 108 (90 BASE) MCG/ACT IN AERS
8.0000 | INHALATION_SPRAY | Freq: Once | RESPIRATORY_TRACT | Status: AC
  Administered 2019-12-27: 8 via RESPIRATORY_TRACT
  Filled 2019-12-27: qty 6.7

## 2019-12-27 MED ORDER — ENOXAPARIN SODIUM 30 MG/0.3ML ~~LOC~~ SOLN
30.0000 mg | SUBCUTANEOUS | Status: DC
  Administered 2019-12-27: 30 mg via SUBCUTANEOUS
  Filled 2019-12-27: qty 0.3

## 2019-12-27 MED ORDER — ONDANSETRON HCL 4 MG/2ML IJ SOLN: 4.0000 mg | Freq: Four times a day (QID) | INTRAMUSCULAR | Status: DC | PRN

## 2019-12-27 MED ORDER — IPRATROPIUM BROMIDE HFA 17 MCG/ACT IN AERS
4.0000 | INHALATION_SPRAY | Freq: Once | RESPIRATORY_TRACT | Status: AC
  Administered 2019-12-27: 4 via RESPIRATORY_TRACT
  Filled 2019-12-27: qty 12.9

## 2019-12-27 MED ORDER — TAMSULOSIN HCL 0.4 MG PO CAPS
0.4000 mg | ORAL_CAPSULE | Freq: Every day | ORAL | Status: DC
  Administered 2019-12-28 – 2019-12-29 (×2): 0.4 mg via ORAL
  Filled 2019-12-27 (×3): qty 1

## 2019-12-27 MED ORDER — ACETAMINOPHEN 325 MG PO TABS: 650.0000 mg | ORAL_TABLET | ORAL | Status: DC | PRN

## 2019-12-27 MED ORDER — SODIUM CHLORIDE 0.9% FLUSH
3.0000 mL | Freq: Two times a day (BID) | INTRAVENOUS | Status: DC
  Administered 2019-12-27 – 2020-01-01 (×11): 3 mL via INTRAVENOUS

## 2019-12-27 MED ORDER — SODIUM CHLORIDE 0.9% FLUSH
3.0000 mL | INTRAVENOUS | Status: DC | PRN
  Administered 2019-12-31: 3 mL via INTRAVENOUS

## 2019-12-27 MED ORDER — FUROSEMIDE 10 MG/ML IJ SOLN
60.0000 mg | Freq: Once | INTRAMUSCULAR | Status: AC
  Administered 2019-12-27: 60 mg via INTRAVENOUS
  Filled 2019-12-27: qty 8

## 2019-12-27 MED ORDER — LATANOPROST 0.005 % OP SOLN
1.0000 [drp] | Freq: Every day | OPHTHALMIC | Status: DC
  Administered 2019-12-27 – 2019-12-31 (×4): 1 [drp] via OPHTHALMIC
  Filled 2019-12-27: qty 2.5

## 2019-12-27 MED ORDER — INSULIN ASPART 100 UNIT/ML ~~LOC~~ SOLN
0.0000 [IU] | Freq: Three times a day (TID) | SUBCUTANEOUS | Status: DC
  Administered 2019-12-27: 5 [IU] via SUBCUTANEOUS
  Administered 2019-12-28: 3 [IU] via SUBCUTANEOUS
  Administered 2019-12-28: 2 [IU] via SUBCUTANEOUS

## 2019-12-27 MED ORDER — FUROSEMIDE 10 MG/ML IJ SOLN
40.0000 mg | Freq: Two times a day (BID) | INTRAMUSCULAR | Status: DC
  Administered 2019-12-27 – 2019-12-30 (×6): 40 mg via INTRAVENOUS
  Filled 2019-12-27 (×7): qty 4

## 2019-12-27 MED ORDER — SODIUM CHLORIDE 0.9 % IV SOLN: 250.0000 mL | INTRAVENOUS | Status: DC | PRN

## 2019-12-27 MED ORDER — IPRATROPIUM-ALBUTEROL 0.5-2.5 (3) MG/3ML IN SOLN
3.0000 mL | Freq: Four times a day (QID) | RESPIRATORY_TRACT | Status: DC
  Administered 2019-12-27 – 2019-12-28 (×3): 3 mL via RESPIRATORY_TRACT
  Filled 2019-12-27 (×2): qty 3

## 2019-12-27 NOTE — ED Provider Notes (Signed)
[facility] DEPT Provider Note   CSN: [id number] Arrival date & time: 12/27/19  1127     History No chief complaint on file.   Leonard [name] is a 86 y.o. male.  Pt is a 86y/o male with history of CAD status post CABG, ischemic cardiomyopathy last EF measured in 2018 was 45 to 50%, hypertension diabetes mellitus type 2, chronic kidney disease, COVID in Jan and recent hospitalization in June for CHF exacerbation who presents today with SOB and hypoxia.  Facility reports that patient started having some shortness of breath last night and had oxygen saturation between 80 to 90% on room air with increased work of breathing.  Patient was given 125 mg of Solu-Medrol, albuterol and Atrovent in route by EMS and he reports upon arrival here he is feeling better.  He does report having a cough but denies any sputum production.  He denies any fever, chest pain or abdominal pain.  Patient does not know [place] he has had any worsening swelling in his legs.  He denies any urinary complaints or diarrhea at this time.  The history is provided by the patient, the EMS personnel and medical records.       Past Medical History:  Diagnosis Date  . Bladder tumor    hematuria  . Carotid stenosis, bilateral 08/20/2009   mild  . CHF (congestive heart failure) ([place])   . Coronary artery disease    A. S/P CABG w/ Redo CABG in 1997;  b. 2005 Cath: LIMA->LAD ok, VG->OM ok, VG->RPDA->RPLV ok. Severe native 3VD.  Marland Kitchen DDD (degenerative disc disease), lumbar    s/p fusion 4-5  . Diabetes mellitus    oral med and insulin  . Diabetic peripheral neuropathy (HCC)    feet  . Diastolic dysfunction    a. 05/2011 Echo: EF 55%, Gr1 DD, mild to mod MR, mildly to mod dil LA, mild TR.  . Glaucoma bilateral    eye drop rx  . History of hiatal hernia    denies reflux  . HOH (hard of hearing) no hearing aids  . Hypertension   . Mass of skin of right shoulder 03/12/2017  . Nocturia   . Prostate  cancer [facility]) 1996   s/p external radiation  . Renal insufficiency hx  . Syncope 06/06/2011   In context of dehydration and bradycardia.  . Thrombocytopenia ([place]) 03/12/2017    Patient Active Problem List   Diagnosis Date Noted  . Malnutrition of moderate degree 11/15/2019  . Complete heart block ([place]) 11/13/2019  . Acute encephalopathy 11/13/2019  . Acute CHF (congestive heart failure) ([place]) 11/12/2019  . CHF (congestive heart failure) ([place]) 11/12/2019  . Moderate nonproliferative diabetic retinopathy of both eyes ([place]) 09/26/2019  . Pseudophakia 09/26/2019  . Posterior vitreous detachment of both eyes 09/26/2019  . 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 COVID-19 virus detected 06/26/2019  . Thrombocytopenia ([place]) 03/12/2017  . Mass of skin of right shoulder 03/12/2017  . Normal pressure primary open angle glaucoma (POAG) 03/11/2017  . Hiatal hernia 03/11/2017  . Chest pain 03/11/2017  . Left leg weakness 06/29/2016  . Degenerative arthritis of left knee 05/25/2016  . Left knee pain 03/24/2016  . History of prostate cancer 06/12/2014  . Chronic renal insufficiency, stage III (moderate) 06/12/2014  . Non-compliant behavior 10/18/2013  . Abnormal computed tomography of pancreas or bile duct 09/21/2011  . Low back pain with sciatica 07/07/2010  . Uncontrolled type 2 diabetes with renal  manifestation ([place]) 03/06/2008    Class: Chronic  . Hyperlipidemia 03/06/2008  . Hx of completed stroke 03/06/2008  . Diabetes ([place]) 07/26/2007  . Essential hypertension 07/26/2007    Class: Chronic  . CAROTID BRUIT 07/26/2007  . Coronary atherosclerosis 07/04/2006    Class: Chronic  . OSTEOPOROSIS 07/04/2006    Past Surgical History:  Procedure Laterality Date  . CARDIAC CATHETERIZATION  last cath 2005   per dr Olevia Perches- w/ chart (grafts patent)-  results w/ chart  . CATARACT EXTRACTION W/ INTRAOCULAR LENS  IMPLANT, BILATERAL    . CATARACT EXTRACTION  W/PHACO Bilateral    Dr. Gershon Crane  . CORONARY ARTERY BYPASS GRAFT  1984   4 vessels  . CORONARY ARTERY BYPASS GRAFT  1997- redo   3 vessels  . LIPOMA EXCISION  1970's   from back  . LUMBAR FUSION  01/2004   L 4-5  . TRANSURETHRAL RESECTION OF BLADDER TUMOR  04/16/2011   Procedure: TRANSURETHRAL RESECTION OF BLADDER TUMOR (TURBT);  Surgeon: Claybon Jabs;  Location: [facility];  Service: Urology;  Laterality: N/A;  . UPPER GASTROINTESTINAL ENDOSCOPY     dx hiatal hernia       Family History  Problem Relation Age of Onset  . Diabetes Sister   . Cancer Neg Hx   . Heart disease Neg Hx   . Stroke Neg Hx     Social History   Tobacco Use  . Smoking status: Never Smoker  . Smokeless tobacco: Never Used  Vaping Use  . Vaping Use: Never used  Substance Use Topics  . Alcohol use: No  . Drug use: No    Home Medications Prior to Admission medications   Medication Sig Start Date End Date Taking? Authorizing Provider  aspirin 81 MG tablet Take 81 mg by mouth daily.     [provider]  B-D INSULIN SYRINGE 29G X 1/2" 1 ML MISC  11/13/19   [provider]  diclofenac sodium (VOLTAREN) 1 % GEL Apply 2 g topically 2 (two) times daily as needed. 02/04/17   Rosemarie Ax, MD  furosemide (LASIX) 20 MG tablet Take 20 mg by mouth every other day, then take 40 mg by mouth on other days, alternating. 12/13/19   Josue Hector, MD  Insulin Glargine (BASAGLAR KWIKPEN) 100 UNIT/ML SOPN Inject 9 Units into the skin daily.    [provider]  insulin lispro (HUMALOG) 100 UNIT/ML injection Inject 0-7 Units into the skin 3 (three) times daily before meals. 0-180 0 units 181-240 3 units 241-300 4 units 301-360 5 units 361-420 6 units 421+ 7 units    [provider]  Insulin Pen Needle 32G X 4 MM MISC As directed per insulin dose and sliding scale 02/04/17   Rosemarie Ax, MD  isosorbide mononitrate (IMDUR) 30 MG 24 hr tablet Take 1 tablet (30 mg  total) by mouth daily. 11/16/19   Nolberto Hanlon, MD  latanoprost (XALATAN) 0.005 % ophthalmic solution Place 1 drop into both eyes at bedtime.    [provider]  losartan (COZAAR) 25 MG tablet Take 1 tablet (25 mg total) by mouth daily. 12/05/19 03/04/20  Burtis Junes, NP  potassium chloride SA (KLOR-CON) 20 MEQ tablet Take 1 tablet (20 mEq total) by mouth daily. 11/19/19   Nolberto Hanlon, MD  Prodigy Lancets 28G MISC  11/13/19   [provider]  Tamsulosin HCl (FLOMAX) 0.4 MG CAPS Take 0.4 mg by mouth daily.    [provider]  vitamin B-12 1000 MCG tablet Take 1 tablet (1,000 mcg total) by mouth daily. 11/16/19   Nolberto Hanlon, MD    Allergies    Codeine, Microzide [hydrochlorothiazide], and Ace inhibitors  Review of Systems   Review of Systems  All other systems reviewed and are negative.   Physical Exam Updated Vital Signs BP (!) 161/87 (BP Location: Right Arm)   Pulse 68   Temp 98.7 F (37.1 C) (Oral)   Resp 20   SpO2 94%   Physical Exam Vitals and nursing note reviewed.  Constitutional:      General: He is not in acute distress.    Appearance: He is well-developed.  HENT:     Head: Normocephalic and atraumatic.     Nose: Nose normal.     Mouth/Throat:     Mouth: Mucous membranes are dry.  Eyes:     Conjunctiva/sclera: Conjunctivae normal.     Pupils: Pupils are equal, round, and reactive to light.  Cardiovascular:     Rate and Rhythm: Normal rate and regular rhythm.     Heart sounds: No murmur heard.   Pulmonary:     Effort: Pulmonary effort is normal. No respiratory distress.     Breath sounds: Examination of the right-lower field reveals rales. Examination of the left-lower field reveals rales. Wheezing and rales present.     Comments: Most of wheezing on expiration it seems to be more upper respiratory in nature Abdominal:     General: There is no distension.     Palpations: Abdomen is soft.     Tenderness: There is no abdominal  tenderness. There is no guarding or rebound.  Musculoskeletal:        General: No tenderness. Normal range of motion.     Cervical back: Normal range of motion and neck supple.     Right lower leg: Edema present.     Left lower leg: Edema present.     Comments: 1+ pitting edema bilaterally in the ankles  Skin:    General: Skin is warm and dry.     Findings: No erythema or rash.  Neurological:     Mental Status: He is alert.     Comments: Oriented to person and place.  Moving all extremities without difficulty  Psychiatric:        Behavior: Behavior normal.     Comments: Calm and cooperative     ED Results / Procedures / Treatments   Labs (all labs ordered are listed, but only abnormal results are displayed) Labs Reviewed  CBC WITH DIFFERENTIAL/PLATELET - Abnormal; Notable for the following components:      Result Value   Platelets 126 (*)    All other components within normal limits  COMPREHENSIVE METABOLIC PANEL - Abnormal; Notable for the following components:   Glucose, Bld 226 (*)    BUN 30 (*)    Creatinine, Ser 1.57 (*)    Calcium 8.4 (*)    AST 80 (*)    ALT 57 (*)    Alkaline Phosphatase 140 (*)    GFR calc non Af Amer 36 (*)    GFR calc Af Amer 42 (*)    All other components within normal limits  BRAIN NATRIURETIC PEPTIDE - Abnormal; Notable for the following components:   B Natriuretic Peptide 1,367.9 (*)    All other components within normal limits  SARS CORONAVIRUS 2 BY RT PCR (HOSPITAL ORDER, [facility])  LACTIC ACID, PLASMA  URINALYSIS, ROUTINE W REFLEX MICROSCOPIC    EKG None  ED ECG REPORT   Date: 12/27/2019  Rate: 65  Rhythm: normal sinus rhythm  QRS Axis: normal  Intervals: PR prolonged and QT prolonged  ST/T Wave abnormalities: nonspecific ST/T changes  Conduction Disutrbances:none  Narrative Interpretation:   Old EKG Reviewed: unchanged  I have personally reviewed the EKG tracing and agree with the computerized  printout as noted.   Radiology DG Chest 2 View  Result Date: 12/27/2019 CLINICAL DATA:  Shortness of breath since last night, decreased oxygen saturation, increased work of breathing EXAM: CHEST - 2 VIEW COMPARISON:  11/12/2019 FINDINGS: Enlargement of cardiac silhouette post CABG. Pulmonary vascular congestion. Hazy BILATERAL pulmonary infiltrates slightly greater on RIGHT, question pulmonary edema though infection not completely excluded. No pleural effusion or pneumothorax. Osseous structures unremarkable. IMPRESSION: Enlargement of cardiac silhouette with pulmonary vascular congestion and BILATERAL pulmonary infiltrates favor pulmonary edema. Electronically Signed   By: Lavonia Dana M.D.   On: 12/27/2019 12:44    Procedures Procedures (including critical care time)  Medications Ordered in ED Medications  albuterol (VENTOLIN HFA) 108 (90 Base) MCG/ACT inhaler 8 puff (has no administration in time range)  ipratropium (ATROVENT HFA) inhaler 4 puff (has no administration in time range)  AeroChamber Plus Flo-Vu Medium MISC 1 each (has no administration in time range)    ED Course  I have reviewed the triage vital signs and the nursing notes.  Pertinent labs & imaging results that were available during my care of the patient were reviewed by me and considered in my medical decision making (see chart for details).    MDM Rules/Calculators/A&P                          Elderly male with multiple medical problems presenting today with shortness of breath that started yesterday.  Patient does have a cough but denies any sputum production or fever.  Patient has had Covid in January with lower suspicion that that is the cause.  On exam here patient's oxygen saturation between 94 and 96% on room air but he does have a coarse cough with some wheezing and rales but also upper transmitted airway sounds.  He denies any sensations of throat swelling and has no evidence of edema or allergic reaction.   Patient does have distal edema, mild JVD and concern for possible CHF exacerbation versus pneumonia.  Labs and imaging pending.  Today patient's EKG shows sinus rhythm with heart rate of 65 and blood pressure has been mildly elevated.  1:22 PM Labs today with normal lactic acid, CBC and CMP with elevated LFTs and stable creatinine.  Today patient's BNP is significantly elevated at 1367 and chest x-ray is consistent with fluid overload and pulmonary edema.  Patient given IV Lasix.  Currently on 2 L nasal cannula and satting normally.  Feel that he will need admission for CHF exacerbation.  MDM Number of Diagnoses or Management Options   Amount and/or Complexity of Data Reviewed Clinical lab tests: ordered and reviewed Tests in the radiology section of CPT: ordered and reviewed Tests in the medicine section of CPT: ordered and reviewed Decide to obtain previous medical records or to obtain history from someone other than the patient: yes Obtain history from someone other than the patient: yes Review and summarize past medical records: yes Discuss the patient with other providers: yes Independent visualization of images, tracings, or specimens: yes  Risk of Complications, Morbidity, and/or  Mortality Presenting problems: high Diagnostic procedures: moderate Management options: moderate  Patient Progress Patient progress: stable  CRITICAL CARE Performed by: Marinda Tyer Total critical care time: 30 minutes Critical care time was exclusive of separately billable procedures and treating other patients. Critical care was necessary to treat or prevent imminent or life-threatening deterioration. Critical care was time spent personally by me on the following activities: development of treatment plan with patient and/or surrogate as well as nursing, discussions with consultants, evaluation of patient's response to treatment, examination of patient, obtaining history from patient or  surrogate, ordering and performing treatments and interventions, ordering and review of laboratory studies, ordering and review of radiographic studies, pulse oximetry and re-evaluation of patient's condition.  Final Clinical Impression(s) / ED Diagnoses Final diagnoses:  Acute on chronic congestive heart failure, unspecified heart failure type [facility])    Rx / DC Orders ED Discharge Orders    None       Blanchie Dessert, MD 12/27/19 1325

## 2019-12-27 NOTE — Progress Notes (Signed)
Leonard Sane, MD gave verbal orders for Cipro 250 mg oral BID. Per pt's Daughter, pt was taking this abx for a UTI with only two days left.

## 2019-12-27 NOTE — ED Notes (Signed)
Report given to [facility], Therapist, sports.

## 2019-12-27 NOTE — ED Triage Notes (Addendum)
Pt arrived via EMS from assisted living facility , per EMS pt started with some SOB starting last night, per staff, pt has spo2 80's-90's RA with increased WOB. Pt spo2 96% on RA in triage, states he is feeling "a little better" after medication, working to breath, speaking in short sentences. Given 125mg  solumedrol, 10 albuterol and 0.5 atrovent by EMS with some relief.   BP 192/90 RR 20 HR 60 Temp 97.3  CBG 252

## 2019-12-27 NOTE — H&P (Signed)
[place] [facility] [place] NAME: Leonard [name]    MR#:  [id number]  DATE OF BIRTH:  [birth year]  DATE OF ADMISSION:  12/27/2019  PRIMARY CARE PHYSICIAN: Hoyt Koch, MD   REQUESTING/REFERRING PHYSICIAN: Blanchie Dessert, MD  Comes from [place] assisted living (was at [facility] till 12/13/2019)  CHIEF COMPLAINT:  No chief complaint on file. Shortness of breath HISTORY OF PRESENT ILLNESS:  Leonard [name]  is a 86 y.o. male with a known history of hypertension, type 2 diabetes, CKD stage IIIa, history of Covid in January, coronary artery disease status post CABG with redo in 1997, last cath in 2005 with patent LIMA and patent SVG, ICM with last measured EF of 40 to 45% on echo dated 11/13/2019.  Patient was admitted in January followed by June 2021.  Patient is a resident at [facility] where he was transferred on 12/13/2019 from [employer].  He was on Lasix 20 mg oral daily per his cardiology Dr. Johnsie Cancel.  On 7/8 he was requested to change his Lasix to 20 mg by mouth every other day alternating with 40 mg on other days. His son in law is a physician/anesthesiologist -Shona Needles, MD.    He was brought to the ED via EMS as he was found to have worsening shortness of breath which started last night with hypoxia and oxygen saturations of 80 to 90% on room air.  He was given 125 mg of IV Solu-Medrol, albuterol and Atrovent by EMS.  While in the ED patient is feeling somewhat better, does report some cough without any fever or sputum production.  While in the ED his chest x-ray is consistent with fluid overload and pulmonary edema.  His BNP is elevated at 1367.  He was given 60 mg of IV Lasix in the ED and is being admitted for further evaluation and management. PAST MEDICAL HISTORY:   Past Medical History:  Diagnosis Date  . Bladder tumor    hematuria  . Carotid stenosis, bilateral 08/20/2009   mild  . CHF (congestive heart failure)  ([place])   . Coronary artery disease    A. S/P CABG w/ Redo CABG in 1997;  b. 2005 Cath: LIMA->LAD ok, VG->OM ok, VG->RPDA->RPLV ok. Severe native 3VD.  Marland Kitchen DDD (degenerative disc disease), lumbar    s/p fusion 4-5  . Diabetes mellitus    oral med and insulin  . Diabetic peripheral neuropathy (HCC)    feet  . Diastolic dysfunction    a. 05/2011 Echo: EF 55%, Gr1 DD, mild to mod MR, mildly to mod dil LA, mild TR.  . Glaucoma bilateral    eye drop rx  . History of hiatal hernia    denies reflux  . HOH (hard of hearing) no hearing aids  . Hypertension   . Mass of skin of right shoulder 03/12/2017  . Nocturia   . Prostate cancer [facility]) 1996   s/p external radiation  . Renal insufficiency hx  . Syncope 06/06/2011   In context of dehydration and bradycardia.  . Thrombocytopenia ([place]) 03/12/2017   PAST SURGICAL HISTORY:   Past Surgical History:  Procedure Laterality Date  . CARDIAC CATHETERIZATION  last cath 2005   per dr Olevia Perches- w/ chart (grafts patent)-  results w/ chart  . CATARACT EXTRACTION W/ INTRAOCULAR LENS  IMPLANT, BILATERAL    . CATARACT EXTRACTION W/PHACO Bilateral    Dr. Gershon Crane  . CORONARY ARTERY BYPASS GRAFT  1984   4 vessels  .  CORONARY ARTERY BYPASS GRAFT  1997- redo   3 vessels  . LIPOMA EXCISION  1970's   from back  . LUMBAR FUSION  01/2004   L 4-5  . TRANSURETHRAL RESECTION OF BLADDER TUMOR  04/16/2011   Procedure: TRANSURETHRAL RESECTION OF BLADDER TUMOR (TURBT);  Surgeon: Claybon Jabs;  Location: [place];  Service: Urology;  Laterality: N/A;  . UPPER GASTROINTESTINAL ENDOSCOPY     dx hiatal hernia   SOCIAL HISTORY:   Social History   Tobacco Use  . Smoking status: Never Smoker  . Smokeless tobacco: Never Used  Substance Use Topics  . Alcohol use: No   FAMILY HISTORY:   Family History  Problem Relation Age of Onset  . Diabetes Sister   . Cancer Neg Hx   . Heart disease Neg Hx   . Stroke Neg Hx    DRUG ALLERGIES:   Allergies   Allergen Reactions  . Codeine Nausea And Vomiting and Other (See Comments)    hallucinations  . Microzide [Hydrochlorothiazide] Other (See Comments)    Acute gout on LosartanHCT 12/2014  . Ace Inhibitors Other (See Comments)     cough   REVIEW OF SYSTEMS:  Review of Systems  Constitutional: Negative for diaphoresis, fever, malaise/fatigue and weight loss.  HENT: Negative for ear discharge, ear pain, hearing loss, nosebleeds, sore throat and tinnitus.   Eyes: Negative for blurred vision and pain.  Respiratory: Positive for shortness of breath and wheezing. Negative for cough and hemoptysis.   Cardiovascular: Negative for chest pain, palpitations, orthopnea and leg swelling.  Gastrointestinal: Negative for abdominal pain, blood in stool, constipation, diarrhea, heartburn, nausea and vomiting.  Genitourinary: Negative for dysuria, frequency and urgency.  Musculoskeletal: Negative for back pain and myalgias.  Skin: Negative for itching and rash.  Neurological: Negative for dizziness, tingling, tremors, focal weakness, seizures, weakness and headaches.  Psychiatric/Behavioral: Negative for depression. The patient is not nervous/anxious.    MEDICATIONS AT HOME:   Prior to Admission medications   Medication Sig Start Date End Date Taking? Authorizing Provider  aspirin 81 MG tablet Take 81 mg by mouth daily.     [provider]  B-D INSULIN SYRINGE 29G X 1/2" 1 ML MISC  11/13/19   [provider]  diclofenac sodium (VOLTAREN) 1 % GEL Apply 2 g topically 2 (two) times daily as needed. 02/04/17   Rosemarie Ax, MD  furosemide (LASIX) 20 MG tablet Take 20 mg by mouth every other day, then take 40 mg by mouth on other days, alternating. 12/13/19   Josue Hector, MD  Insulin Glargine (BASAGLAR KWIKPEN) 100 UNIT/ML SOPN Inject 9 Units into the skin daily.    [provider]  insulin lispro (HUMALOG) 100 UNIT/ML injection Inject 0-7 Units into the skin 3 (three) times  daily before meals. 0-180 0 units 181-240 3 units 241-300 4 units 301-360 5 units 361-420 6 units 421+ 7 units    [provider]  Insulin Pen Needle 32G X 4 MM MISC As directed per insulin dose and sliding scale 02/04/17   Rosemarie Ax, MD  isosorbide mononitrate (IMDUR) 30 MG 24 hr tablet Take 1 tablet (30 mg total) by mouth daily. 11/16/19   Nolberto Hanlon, MD  latanoprost (XALATAN) 0.005 % ophthalmic solution Place 1 drop into both eyes at bedtime.    [provider]  losartan (COZAAR) 25 MG tablet Take 1 tablet (25 mg total) by mouth daily. 12/05/19 03/04/20  Burtis Junes, NP  potassium chloride SA (KLOR-CON) 20 MEQ tablet Take 1 tablet (20 mEq total) by mouth daily. 11/19/19   Nolberto Hanlon, MD  Prodigy Lancets 28G MISC  11/13/19   [provider]  Tamsulosin HCl (FLOMAX) 0.4 MG CAPS Take 0.4 mg by mouth daily.    [provider]  vitamin B-12 1000 MCG tablet Take 1 tablet (1,000 mcg total) by mouth daily. 11/16/19   Nolberto Hanlon, MD    VITAL SIGNS:  Blood pressure (!) 184/120, pulse 65, temperature 98.7 F (37.1 C), temperature source Oral, resp. rate 20, SpO2 100 %. PHYSICAL EXAMINATION:  Physical Exam  GENERAL:  86 y.o.-year-old patient lying in the bed with no acute distress.  EYES: Pupils equal, round, reactive to light and accommodation. No scleral icterus. Extraocular muscles intact.  HEENT: Head atraumatic, normocephalic. Oropharynx and nasopharynx clear.  NECK:  Supple, no jugular venous distention. No thyroid enlargement, no tenderness.  LUNGS: Decreased breath sounds bilaterally, mild expiratory wheezing, no rhonchi or crepitation. No use of accessory muscles of respiration.  CARDIOVASCULAR: S1, S2 normal.  Rales at the bases,  ABDOMEN: Soft, nontender, nondistended. Bowel sounds present. No organomegaly or mass.  EXTREMITIES: 1+ pedal edema, no cyanosis, or clubbing.  NEUROLOGIC: Cranial nerves II through XII are intact. Muscle  strength 5/5 in all extremities. Sensation intact. Gait not checked.  PSYCHIATRIC: The patient is alert and confused. SKIN: No obvious rash, lesion, or ulcer.  LABORATORY PANEL:   CBC Recent Labs  Lab 12/27/19 1140  WBC 4.8  HGB 13.1  HCT 40.3  PLT 126*   ------------------------------------------------------------------------------------------------------------------  Chemistries  Recent Labs  Lab 12/27/19 1140  NA 137  K 5.1  CL 102  CO2 22  GLUCOSE 226*  BUN 30*  CREATININE 1.57*  CALCIUM 8.4*  AST 80*  ALT 57*  ALKPHOS 140*  BILITOT 0.7   ------------------------------------------------------------------------------------------------------------------  Cardiac Enzymes No results for input(s): TROPONINI in the last 168 hours. ------------------------------------------------------------------------------------------------------------------  RADIOLOGY:  DG Chest 2 View  Result Date: 12/27/2019 CLINICAL DATA:  Shortness of breath since last night, decreased oxygen saturation, increased work of breathing EXAM: CHEST - 2 VIEW COMPARISON:  11/12/2019 FINDINGS: Enlargement of cardiac silhouette post CABG. Pulmonary vascular congestion. Hazy BILATERAL pulmonary infiltrates slightly greater on RIGHT, question pulmonary edema though infection not completely excluded. No pleural effusion or pneumothorax. Osseous structures unremarkable. IMPRESSION: Enlargement of cardiac silhouette with pulmonary vascular congestion and BILATERAL pulmonary infiltrates favor pulmonary edema. Electronically Signed   By: Lavonia Dana M.D.   On: 12/27/2019 12:44   IMPRESSION AND PLAN:  86 year old male with a known history of hypertension, type 2 diabetes, CKD stage IIIa, history of Covid in January, coronary artery disease status post CABG with redo in 1997, last cath in 2005 with patent LIMA and patent SVG, ICM with last measured EF of 40 to 45% on echo dated 11/13/2019, is admitted for acute on  chronic systolic CHF exacerbation.  Acute on chronic systolic CHF Echo on 07/12/3662 showed EF of 40 to 45% Followed by [facility] cardiology, they have been notified and will see him tomorrow -Monitor on telemetry, Lasix 40 mg IV twice daily, strict I's and O's, daily weights  CKD stage IIIa Monitor renal function closely with ongoing diuresis, consider nephrology consultation. Hold losartan for now  Diabetes mellitus type 2 Sliding scale insulin for now, add Levemir and NovoLog 3 units with each meal.  Diabetic nurse coordinator consult  Uncontrolled hypertension Resume home blood pressure medicine except losartan for now, adjust medicine as  needed  Elevated LFTs Likely due to hepatic congestion from CHF, monitor liver functions Could have underlying liver disease.  Thrombocytopenia Platelets 126, monitor, cautious with heparin products  Advanced age and confusion Patient's wife passed away recently per records.  Mental status remained to be challenge since then per review of records Will consult palliative care for goals of care discussion. No family at bedside. We will consult PT/OT to decide need for higher level of care.  Status is: Inpatient  Remains inpatient appropriate because:IV treatments appropriate due to intensity of illness or inability to take PO   Dispo: The patient is from: ALF              Anticipated d/c is to: ALF              Anticipated d/c date is: 3 days              Patient currently is not medically stable to d/c.   All the records are reviewed and case discussed with ED provider. Management plans discussed with the patient, nursing and they are in agreement.  CODE STATUS: DNR  TOTAL TIME TAKING CARE OF THIS PATIENT: 45 minutes.    Max Sane M.D on 12/27/2019 at 2:04 PM  [facility]   CC: Primary care physician; Hoyt Koch, MD   Note: This dictation was prepared with Dragon dictation along with smaller phrase technology.  Any transcriptional errors that result from this process are unintentional.

## 2019-12-27 NOTE — Progress Notes (Signed)
Nursing notified - family requesting to finish course of Cipro for his UTI and continuous pulse Ox for now.

## 2019-12-28 ENCOUNTER — Encounter (HOSPITAL_COMMUNITY): Payer: Self-pay | Admitting: Internal Medicine

## 2019-12-28 DIAGNOSIS — R0602 Shortness of breath: Secondary | ICD-10-CM

## 2019-12-28 DIAGNOSIS — Z7189 Other specified counseling: Secondary | ICD-10-CM

## 2019-12-28 DIAGNOSIS — R531 Weakness: Secondary | ICD-10-CM

## 2019-12-28 DIAGNOSIS — J9601 Acute respiratory failure with hypoxia: Secondary | ICD-10-CM

## 2019-12-28 DIAGNOSIS — Z515 Encounter for palliative care: Secondary | ICD-10-CM

## 2019-12-28 LAB — HEPATIC FUNCTION PANEL
ALT: 45 U/L — ABNORMAL HIGH (ref 0–44)
AST: 30 U/L (ref 15–41)
Albumin: 3.5 g/dL (ref 3.5–5.0)
Alkaline Phosphatase: 124 U/L (ref 38–126)
Bilirubin, Direct: 0.1 mg/dL (ref 0.0–0.2)
Indirect Bilirubin: 0.4 mg/dL (ref 0.3–0.9)
Total Bilirubin: 0.5 mg/dL (ref 0.3–1.2)
Total Protein: 6.5 g/dL (ref 6.5–8.1)

## 2019-12-28 LAB — BASIC METABOLIC PANEL
Anion gap: 12 (ref 5–15)
BUN: 40 mg/dL — ABNORMAL HIGH (ref 8–23)
CO2: 23 mmol/L (ref 22–32)
Calcium: 8.2 mg/dL — ABNORMAL LOW (ref 8.9–10.3)
Chloride: 101 mmol/L (ref 98–111)
Creatinine, Ser: 1.66 mg/dL — ABNORMAL HIGH (ref 0.61–1.24)
GFR calc Af Amer: 39 mL/min — ABNORMAL LOW (ref >60)
GFR calc non Af Amer: 34 mL/min — ABNORMAL LOW (ref >60)
Glucose, Bld: 232 mg/dL — ABNORMAL HIGH (ref 70–99)
Potassium: 4.7 mmol/L (ref 3.5–5.1)
Sodium: 136 mmol/L (ref 135–145)

## 2019-12-28 LAB — GLUCOSE, CAPILLARY
Glucose-Capillary: 169 mg/dL — ABNORMAL HIGH (ref 70–99)
Glucose-Capillary: 126 mg/dL — ABNORMAL HIGH (ref 70–99)
Glucose-Capillary: 168 mg/dL — ABNORMAL HIGH (ref 70–99)
Glucose-Capillary: 27 mg/dL — CL (ref 70–99)
Glucose-Capillary: 58 mg/dL — ABNORMAL LOW (ref 70–99)
Glucose-Capillary: 113 mg/dL — ABNORMAL HIGH (ref 70–99)
Glucose-Capillary: 128 mg/dL — ABNORMAL HIGH (ref 70–99)
Glucose-Capillary: 34 mg/dL — CL (ref 70–99)
Glucose-Capillary: 91 mg/dL (ref 70–99)

## 2019-12-28 LAB — HEMOGLOBIN A1C
Hgb A1c MFr Bld: 9.3 % — ABNORMAL HIGH (ref 4.8–5.6)
Mean Plasma Glucose: 220.21 mg/dL

## 2019-12-28 LAB — MAGNESIUM: Magnesium: 2.4 mg/dL (ref 1.7–2.4)

## 2019-12-28 MED ORDER — DEXTROSE 50 % IV SOLN
INTRAVENOUS | Status: AC
  Administered 2019-12-28: 50 mL
  Filled 2019-12-28: qty 50

## 2019-12-28 MED ORDER — ESCITALOPRAM OXALATE 10 MG PO TABS
5.0000 mg | ORAL_TABLET | Freq: Every day | ORAL | Status: DC
  Administered 2019-12-29: 5 mg via ORAL
  Filled 2019-12-28: qty 1

## 2019-12-28 MED ORDER — GLUCAGON HCL RDNA (DIAGNOSTIC) 1 MG IJ SOLR
INTRAMUSCULAR | Status: AC
  Filled 2019-12-28: qty 1

## 2019-12-28 MED ORDER — INSULIN ASPART 100 UNIT/ML ~~LOC~~ SOLN
0.0000 [IU] | Freq: Three times a day (TID) | SUBCUTANEOUS | Status: DC
  Administered 2019-12-29 (×2): 3 [IU] via SUBCUTANEOUS
  Administered 2019-12-30: 2 [IU] via SUBCUTANEOUS
  Administered 2019-12-30: 4 [IU] via SUBCUTANEOUS

## 2019-12-28 MED ORDER — ALBUTEROL SULFATE (2.5 MG/3ML) 0.083% IN NEBU
2.5000 mg | INHALATION_SOLUTION | RESPIRATORY_TRACT | Status: DC | PRN
  Administered 2019-12-29 (×2): 2.5 mg via RESPIRATORY_TRACT
  Filled 2019-12-28 (×2): qty 3

## 2019-12-28 MED ORDER — DEXTROSE 50 % IV SOLN
INTRAVENOUS | Status: AC
  Administered 2019-12-28: 50 mL via INTRAVENOUS
  Filled 2019-12-28: qty 50

## 2019-12-28 MED ORDER — INSULIN ASPART 100 UNIT/ML ~~LOC~~ SOLN: 0.0000 [IU] | Freq: Three times a day (TID) | SUBCUTANEOUS | Status: DC

## 2019-12-28 MED ORDER — INSULIN DETEMIR 100 UNIT/ML ~~LOC~~ SOLN
7.0000 [IU] | Freq: Every day | SUBCUTANEOUS | Status: DC
  Filled 2019-12-28: qty 0.07

## 2019-12-28 MED ORDER — LORAZEPAM 2 MG/ML IJ SOLN
0.5000 mg | Freq: Once | INTRAMUSCULAR | Status: AC
  Administered 2019-12-28: 0.5 mg via INTRAVENOUS

## 2019-12-28 MED ORDER — INSULIN DETEMIR 100 UNIT/ML ~~LOC~~ SOLN
5.0000 [IU] | Freq: Every day | SUBCUTANEOUS | Status: DC
  Filled 2019-12-28: qty 0.05

## 2019-12-28 MED ORDER — HEPARIN SODIUM (PORCINE) 5000 UNIT/ML IJ SOLN
5000.0000 [IU] | Freq: Three times a day (TID) | INTRAMUSCULAR | Status: DC
  Administered 2019-12-28 – 2019-12-30 (×5): 5000 [IU] via SUBCUTANEOUS
  Filled 2019-12-28 (×4): qty 1

## 2019-12-28 MED ORDER — IPRATROPIUM-ALBUTEROL 0.5-2.5 (3) MG/3ML IN SOLN
3.0000 mL | Freq: Three times a day (TID) | RESPIRATORY_TRACT | Status: DC
  Administered 2019-12-28 – 2019-12-30 (×5): 3 mL via RESPIRATORY_TRACT
  Filled 2019-12-28 (×7): qty 3

## 2019-12-28 MED ORDER — INSULIN ASPART 100 UNIT/ML ~~LOC~~ SOLN: 0.0000 [IU] | Freq: Every day | SUBCUTANEOUS | Status: DC

## 2019-12-28 MED ORDER — LORAZEPAM 2 MG/ML IJ SOLN
INTRAMUSCULAR | Status: AC
  Filled 2019-12-28: qty 1

## 2019-12-28 MED ORDER — ENSURE ENLIVE PO LIQD
237.0000 mL | Freq: Two times a day (BID) | ORAL | Status: DC
  Administered 2019-12-29: 237 mL via ORAL

## 2019-12-28 NOTE — Progress Notes (Addendum)
Hypoglycemic Event  CBG: 58  Treatment: Dextrose 50%; MD ordered D5 infusion until resolution of hypoglycemia. RN stopped D5 infusion upon resolution.  Symptoms: none  Follow-up CBG: Time:1730 CBG Result: 113  Possible Reasons for Event: patient not eating much  Comments/MD notified: Dr. Bonner Puna, MD    Elizebeth Brooking

## 2019-12-28 NOTE — Evaluation (Signed)
Occupational Therapy Evaluation Patient Details Name: Leonard [name] MRN: [id number] DOB: [birth year] Today's Date: 12/28/2019    History of Present Illness 86 y.o. male with a known history of hypertension, type 2 diabetes, CKD stage IIIa, history of Covid in January, coronary artery disease status post CABG with redo in 1997, last cath in 2005 with patent LIMA and patent SVG, ICM with last measured EF of 40 to 45% on echo dated 11/13/2019.  Patient was admitted in January followed by June 2021. Patient is a resident at [facility] assisted [facility] where he was transferred on 12/13/2019 from [employer].  Pt admitted for acute on chronic CHF.   Clinical Impression   Pt admitted with the above. Pt currently with functional limitations due to the deficits listed below (see OT Problem List).  Pt will benefit from skilled OT to increase their safety and independence with ADL and functional mobility for ADL to facilitate discharge to venue listed below.      Follow Up Recommendations  SNF or increased A at ALF  Equipment Recommendations  None recommended by OT    Recommendations for Other Services       Precautions / Restrictions Precautions Precautions: Fall Precaution Comments: monitor sats      Mobility Bed Mobility Overal bed mobility: Needs Assistance Bed Mobility: Supine to Sit;Sit to Supine     Supine to sit: Mod assist Sit to supine: Mod assist   General bed mobility comments: assist for trunk upright  Transfers Overall transfer level: Needs assistance Equipment used: Rolling walker (2 wheeled) Transfers: Sit to/from [facility] Sit to Stand: Mod assist Stand pivot transfers: Min assist       General transfer comment: Pt sat EOB with OT but did not stand.  Coughed significanty at lunch. RN aware    Balance Overall balance assessment: Needs assistance         Standing balance support: Bilateral upper extremity supported Standing balance-Leahy  Scale: Zero                             ADL either performed or assessed with clinical judgement   ADL Overall ADL's : Needs assistance/impaired Eating/Feeding: Moderate assistance;Sitting   Grooming: Moderate assistance;Sitting                                                    Pertinent Vitals/Pain Pain Assessment: No/denies pain     Hand Dominance     Extremity/Trunk Assessment Upper Extremity Assessment Upper Extremity Assessment: Generalized weakness   Lower Extremity Assessment Lower Extremity Assessment: Generalized weakness       Communication Communication Communication: HOH   Cognition Arousal/Alertness: Awake/alert Behavior During Therapy: Flat affect Overall Cognitive Status: No family/caregiver present to determine baseline cognitive functioning                                 General Comments: pt agreeable to get OOB to recliner however also makes statements out of context   General Comments               Home Living Family/patient expects to be discharged to:: Assisted living  Home Equipment: Gilford Rile - 2 wheels   Additional Comments: pt poor historian      Prior Functioning/Environment Level of Independence: Independent with assistive device(s)        Comments: recently at SNF, from ALF        OT Problem List: Decreased strength;Impaired balance (sitting and/or standing);Decreased knowledge of use of DME or AE;Decreased activity tolerance      OT Treatment/Interventions: Self-care/ADL training;Therapeutic activities;Patient/family education    OT Goals(Current goals can be found in the care plan section) Acute Rehab OT Goals Patient Stated Goal: did not state OT Goal Formulation: With patient Time For Goal Achievement: 01/12/20 Potential to Achieve Goals: Good  OT Frequency: Min 2X/week    AM-PAC OT "6 Clicks" Daily Activity     Outcome  Measure Help from another person eating meals?: A Lot Help from another person taking care of personal grooming?: A Lot Help from another person toileting, which includes using toliet, bedpan, or urinal?: A Lot Help from another person bathing (including washing, rinsing, drying)?: A Lot Help from another person to put on and taking off regular upper body clothing?: A Lot Help from another person to put on and taking off regular lower body clothing?: A Lot 6 Click Score: 12   End of Session Nurse Communication: Mobility status  Activity Tolerance: Patient limited by fatigue Patient left: in bed;with call bell/phone within reach;with bed alarm set  OT Visit Diagnosis: Unsteadiness on feet (R26.81);Muscle weakness (generalized) (M62.81);Other abnormalities of gait and mobility (R26.89)                Time: 4656-8127 OT Time Calculation (min): 15 min Charges:  OT General Charges $OT Visit: 1 Visit OT Evaluation $OT Eval Moderate Complexity: 1 Mod  Kari Baars, Walker [phone] Office- [phone]     Vanleer, Edwena Felty D 12/28/2019, 2:52 PM

## 2019-12-28 NOTE — Progress Notes (Signed)
Inpatient Diabetes Program Recommendations  AACE/ADA: New Consensus Statement on Inpatient Glycemic Control (2015)  Target Ranges:  Prepandial:   less than 140 mg/dL      Peak postprandial:   less than 180 mg/dL (1-2 hours)      Critically ill patients:  140 - 180 mg/dL   Lab Results  Component Value Date   GLUCAP 27 (LL) 12/28/2019   HGBA1C 9.3 (H) 12/28/2019    Review of Glycemic Control  Blood sugar 27 mg/dL Blood sugar 126 at lunch and received 5 units Novolog.  Inpatient Diabetes Program Recommendations:     Decrease Levemir to 7 units QD Decrease Novolog to 0-6 units tid  Allow permissive hyperglycemia.  Discussed with MD, RN.  Thank you. Lorenda Peck, RD, LDN, CDE Inpatient Diabetes Coordinator [phone]

## 2019-12-28 NOTE — Progress Notes (Signed)
Hypoglycemic Event  CBG: 27  Treatment: Dextrose 50%  Symptoms: lethargic  Follow-up CBG: Time: 1555 CBG Result:168  Possible Reasons for Event: minimal PO intake  Comments/MD notified: Dr. Bonner Puna, MD    Elizebeth Brooking

## 2019-12-28 NOTE — Evaluation (Signed)
Physical Therapy Evaluation Patient Details Name: Leonard [name] MRN: [id number] DOB: 08-24-1920 Today's Date: 12/28/2019   History of Present Illness  86 y.o. male with a known history of hypertension, type 2 diabetes, CKD stage IIIa, history of Covid in January, coronary artery disease status post CABG with redo in 1997, last cath in 2005 with patent LIMA and patent SVG, ICM with last measured EF of 40 to 45% on echo dated 11/13/2019.  Patient was admitted in January followed by June 2021. Patient is a resident at [facility] assisted [facility] where he was transferred on 12/13/2019 from [employer].  Pt admitted for acute on chronic CHF.  Clinical Impression  Pt admitted with above diagnosis.  Pt currently with functional limitations due to the deficits listed below (see PT Problem List). Pt will benefit from skilled PT to increase their independence and safety with mobility to allow discharge to the venue listed below.  Pt assisted OOB and over to recliner.  Pt with increased work of breathing and wheezing with transfer.  Pt remained on 2L O2 [place] (see below).  Pt requiring at least mod assist for mobility at this time.  Recommend SNF if ALF unable to provide current assist level.     Follow Up Recommendations SNF;Supervision for mobility/OOB    Equipment Recommendations  None recommended by PT    Recommendations for Other Services       Precautions / Restrictions Precautions Precautions: Fall Precaution Comments: monitor sats      Mobility  Bed Mobility Overal bed mobility: Needs Assistance Bed Mobility: Supine to Sit     Supine to sit: Min assist     General bed mobility comments: assist for trunk upright  Transfers Overall transfer level: Needs assistance Equipment used: Rolling walker (2 wheeled) Transfers: Sit to/from [facility] Sit to Stand: Mod assist Stand pivot transfers: Min assist       General transfer comment: assist to rise and steady,  multimodal cues for safe technique, pt remained on 2L O2 [place] and SpO2 briefly reading 76% however improved quickly (uncertain accuracy) back to 100%  Ambulation/Gait                Stairs            Wheelchair Mobility    Modified Rankin (Stroke Patients Only)       Balance Overall balance assessment: Needs assistance         Standing balance support: Bilateral upper extremity supported Standing balance-Leahy Scale: Zero                               Pertinent Vitals/Pain Pain Assessment: No/denies pain    Home Living Family/patient expects to be discharged to:: Assisted living               Home Equipment: Walker - 2 wheels Additional Comments: pt poor historian    Prior Function Level of Independence: Independent with assistive device(s)         Comments: recently at SNF, from ALF     Hand Dominance        Extremity/Trunk Assessment        Lower Extremity Assessment Lower Extremity Assessment: Generalized weakness       Communication   Communication: HOH  Cognition Arousal/Alertness: Awake/alert Behavior During Therapy: Flat affect Overall Cognitive Status: No family/caregiver present to determine baseline cognitive functioning  General Comments: pt agreeable to get OOB to recliner however also makes statements out of context      General Comments      Exercises     Assessment/Plan    PT Assessment Patient needs continued PT services  PT Problem List Decreased strength;Decreased mobility;Decreased knowledge of use of DME;Decreased balance;Decreased activity tolerance;Cardiopulmonary status limiting activity       PT Treatment Interventions DME instruction;Therapeutic exercise;Gait training;Balance training;Functional mobility training;Therapeutic activities;Patient/family education;Neuromuscular re-education    PT Goals (Current goals can be found in the Care  Plan section)  Acute Rehab PT Goals PT Goal Formulation: Patient unable to participate in goal setting Time For Goal Achievement: 01/11/20 Potential to Achieve Goals: Good    Frequency Min 2X/week   Barriers to discharge        Co-evaluation               AM-PAC PT "6 Clicks" Mobility  Outcome Measure Help needed turning from your back to your side while in a flat bed without using bedrails?: A Little Help needed moving from lying on your back to sitting on the side of a flat bed without using bedrails?: A Little Help needed moving to and from a bed to a chair (including a wheelchair)?: A Lot Help needed standing up from a chair using your arms (e.g., wheelchair or bedside chair)?: A Lot Help needed to walk in hospital room?: A Lot Help needed climbing 3-5 steps with a railing? : Total 6 Click Score: 13    End of Session Equipment Utilized During Treatment: Gait belt Activity Tolerance: Patient limited by fatigue Patient left: in chair;with call bell/phone within reach;with chair alarm set Nurse Communication: Mobility status PT Visit Diagnosis: Other abnormalities of gait and mobility (R26.89)    Time: 3646-8032 PT Time Calculation (min) (ACUTE ONLY): 14 min   Charges:   PT Evaluation $PT Eval Low Complexity: 1 Low        Kati PT, DPT Acute Rehabilitation Services Pager: [phone] Office: [phone] York Ram E 12/28/2019, 12:52 PM

## 2019-12-28 NOTE — Progress Notes (Signed)
Initial Nutrition Assessment  INTERVENTION:   -Ensure Enlive po BID, each supplement provides 350 kcal and 20 grams of protein  NUTRITION DIAGNOSIS:   Increased nutrient needs related to chronic illness as evidenced by estimated needs.  GOAL:   Patient will meet greater than or equal to 90% of their needs  MONITOR:   PO intake, Supplement acceptance, Labs, Weight trends, I & O's  REASON FOR ASSESSMENT:   Consult Assessment of nutrition requirement/status  ASSESSMENT:   86 y.o. male with a known history of hypertension, type 2 diabetes, CKD stage IIIa, history of Covid in January, coronary artery disease status post CABG with redo in 1997, last cath in 2005 with patent LIMA and patent SVG, ICM with last measured EF of 40 to 45% on echo dated 11/13/2019.  Patient was admitted in January followed by June 2021.  Patient unable to provide any history given confusion. Per palliative care note, pt's daughter wants to pursue memory care unit with palliative following.  Pt consumed 100% of dinner 7/22. Nothing else documented today. Will order Ensure supplements.   Per weight records, pt's weight has fluctuated between 127-143 lbs since January 2021.  Current weight: 143 lbs.  Medications: Lasix Labs reviewed: CBGs: 126-169   NUTRITION - FOCUSED PHYSICAL EXAM:  Deferred.  Diet Order:   Diet Order            Diet Carb Modified Fluid consistency: Thin; Room service appropriate? Yes  Diet effective now                 EDUCATION NEEDS:   Not appropriate for education at this time  Skin:  Skin Assessment: Reviewed RN Assessment  Last BM:  7/23 -type 3  Height:   Ht Readings from Last 1 Encounters:  12/27/19 5\' 4"  (1.626 m)    Weight:   Wt Readings from Last 1 Encounters:  12/28/19 64.9 kg    Ideal Body Weight:     BMI:  Body mass index is 24.56 kg/m.  Estimated Nutritional Needs:   Kcal:  1650-1850  Protein:  75-85g  Fluid:  1.6L/day  Clayton Bibles,  MS, RD, LDN Inpatient Clinical Dietitian Contact information available via Amion

## 2019-12-28 NOTE — Progress Notes (Signed)
Patient confusion is leading him to continually remove or dislodge telemetry leads; multiple calls from [facility] communicated with CCMD regarding patient confusion and our efforts to try to keep leads on patient.    RN and NT will continue as best as possible to replace leads as they become dislodged.

## 2019-12-28 NOTE — Consult Note (Addendum)
Cardiology Consultation:   Patient ID: Leonard [name] MRN: [id number]; DOB: 1921-02-04  Admit date: 12/27/2019 Date of Consult: 12/28/2019  Primary Care Provider: Hoyt Koch, MD [facility] Cardiologist: Jenkins Rouge, MD  [facility] Electrophysiologist:  None    Patient Profile:   Leonard [name] is a 86 y.o. male with a hx of CAD s/p CABG 1984 & 1997, CKD stage III (baseline Cr appears 1.3-1.6), carotid artery disease, DM with peripheral neuropathy, HTN, HLD, glaucoma, prostate CA s/p XRT, prior snycope felt due to dehydration/bradycardia, thrombocytopenia, recent underlying conduction disease, chronic systolic CHF, COVID PNA 40/9811, mild dilation of ascending aorta who is being seen today for the evaluation of CHF at the request of Dr. Manuella Ghazi.  History of Present Illness:   He was recently in the hospital for SOB, CHF, and conduction disease. He was mildy hypoxic and was diuresed. 2D echo 11/13/19 EF 40-45%, mild LVH, moderately reduced RV function, moderate LAE, mild MR, dilation of ascending aorta. During admission he was seen by cardiology for concern for CHB; this was not felt to be the case - had first degree AV block, Wenkebach, RBBB, LAFB. There was not felt to be an indication for PPM. It was recommended to avoid AV nodal blocking agents from here on out. TSH wnl. Hospitalization also complicated by agitation/delirium requiring restraints. He was seen in follow-up by Truitt Merle 12/05/19 accompanied by son in law who is a physician/anesthesiologist, Shona Needles, MD. During that visit he was lethargic with low BP therefore Lasix was held and losartan decreased. He saw Dr. Johnsie Cancel back in f/u 12/11/19 with better blood pressure but labs showing elevated BNP. The patient was supposed to be back on Lasix 20mg  daily at that time, and due to labs, was increased to 40mg  alternating with 20mg  daily. Home BP meds include Lasix, Imdur 30mg , losartan 25mg .  History is obtained  from the chart because the patient appears confused. He reports he came in because people were more concerned about him than he was. He says the only pain he feels is towards his brother. He presented to the ED with worsening SOB, hypoxia of 80-90% on RA. He was given 125 mg of IV Solu-Medrol, albuterol and Atrovent by EMS.  Labs showed BNP 1367, lactic acid normal, Hgb normal, K 5.1, Cr 1.57->1.66, mildly abnormal LFTs,  A1C 9.3. CXR with enlarged cardiac silhouette with pulmonary vascular congestion and bilateral pulmonary infiltrates favoring pulmonary edema. Blood pressure has also been frequently elevated to a peak of 189/111. Admitting medical team placed him on IV Lasix (60mg  x 1 then 40mg  IV BID) and started carvedilol. Previous OP weight 137lb on 7/6, up to 145 on admit, down to 143 today. Palliative care has been called.   Past Medical History:  Diagnosis Date  . Bladder tumor    hematuria  . Carotid stenosis, bilateral 08/20/2009   mild  . Chronic systolic CHF (congestive heart failure) ([place])   . CKD (chronic kidney disease), stage III   . Coronary artery disease    A. S/P CABG w/ Redo CABG in 1997;  b. 2005 Cath: LIMA->LAD ok, VG->OM ok, VG->RPDA->RPLV ok. Severe native 3VD.  Marland Kitchen DDD (degenerative disc disease), lumbar    s/p fusion 4-5  . Diabetes mellitus   . Diabetic peripheral neuropathy (HCC)    feet  . Glaucoma bilateral    eye drop rx  . Heart block    a. h/o known conduction disease with first degree AV block, Wenkebach, RBBB,  LAFB -> advised to avoid AVN blocking agents.  . History of hiatal hernia    denies reflux  . HOH (hard of hearing) no hearing aids  . Hypertension   . Mass of skin of right shoulder 03/12/2017  . Mild dilation of ascending aorta (HCC)   . Nocturia   . Pancytopenia ([place])    noted on labs  . Pneumonia due to COVID-19 virus 06/2019  . Prostate cancer [facility]) 1996   s/p external radiation  . Renal insufficiency hx  . Syncope 06/06/2011   In  context of dehydration and bradycardia.  . Thrombocytopenia ([place]) 03/12/2017    Past Surgical History:  Procedure Laterality Date  . CARDIAC CATHETERIZATION  last cath 2005   per dr Olevia Perches- w/ chart (grafts patent)-  results w/ chart  . CATARACT EXTRACTION W/ INTRAOCULAR LENS  IMPLANT, BILATERAL    . CATARACT EXTRACTION W/PHACO Bilateral    Dr. Gershon Crane  . CORONARY ARTERY BYPASS GRAFT  1984   4 vessels  . CORONARY ARTERY BYPASS GRAFT  1997- redo   3 vessels  . LIPOMA EXCISION  1970's   from back  . LUMBAR FUSION  01/2004   L 4-5  . TRANSURETHRAL RESECTION OF BLADDER TUMOR  04/16/2011   Procedure: TRANSURETHRAL RESECTION OF BLADDER TUMOR (TURBT);  Surgeon: Claybon Jabs;  Location: [place];  Service: Urology;  Laterality: N/A;  . UPPER GASTROINTESTINAL ENDOSCOPY     dx hiatal hernia     Home Medications:  Prior to Admission medications   Medication Sig Start Date End Date Taking? Authorizing Provider  aspirin 81 MG tablet Take 81 mg by mouth daily.    Yes [provider]  diclofenac sodium (VOLTAREN) 1 % GEL Apply 2 g topically 2 (two) times daily as needed. 02/04/17  Yes Rosemarie Ax, MD  Ensure (ENSURE) Take 237 mLs by mouth daily.   Yes [provider]  feeding supplement, GLUCERNA SHAKE, (GLUCERNA SHAKE) LIQD Take 237 mLs by mouth daily.   Yes [provider]  gabapentin (NEURONTIN) 600 MG tablet Take 600 mg by mouth 3 (three) times daily.   Yes [provider]  Insulin Glargine (BASAGLAR KWIKPEN) 100 UNIT/ML SOPN Inject 9 Units into the skin daily.   Yes [provider]  insulin lispro (HUMALOG) 100 UNIT/ML injection Inject 0-6 Units into the skin 3 (three) times daily before meals. 0-200 0 units 201-260 3 units 261-320 4 units 321-380 5 units 381-600 6 units Over 300 contact dr   Yes [provider]  isosorbide mononitrate (IMDUR) 30 MG 24 hr tablet Take 1 tablet (30 mg total) by mouth daily.  11/16/19  Yes Nolberto Hanlon, MD  losartan (COZAAR) 25 MG tablet Take 1 tablet (25 mg total) by mouth daily. 12/05/19 03/04/20 Yes Burtis Junes, NP  potassium chloride SA (KLOR-CON) 20 MEQ tablet Take 1 tablet (20 mEq total) by mouth daily. 11/19/19  Yes Nolberto Hanlon, MD  Tamsulosin HCl (FLOMAX) 0.4 MG CAPS Take 0.4 mg by mouth daily.   Yes [provider]  vitamin B-12 1000 MCG tablet Take 1 tablet (1,000 mcg total) by mouth daily. 11/16/19  Yes Nolberto Hanlon, MD  B-D INSULIN SYRINGE 29G X 1/2" 1 ML MISC  11/13/19   [provider]  ciprofloxacin (CIPRO) 250 MG tablet Take 250 mg by mouth 2 (two) times daily. 10 days supply 12/19/19 12/29/19  [provider]  furosemide (LASIX) 20 MG tablet Take 20 mg by mouth every  other day, then take 40 mg by mouth on other days, alternating. Patient taking differently: Take 20 mg by mouth daily.  12/13/19   Josue Hector, MD  Insulin Pen Needle 32G X 4 MM MISC As directed per insulin dose and sliding scale 02/04/17   Rosemarie Ax, MD  latanoprost (XALATAN) 0.005 % ophthalmic solution Place 1 drop into both eyes at bedtime.    [provider]  Prodigy Lancets 28G [place]  11/13/19   [provider]    Inpatient Medications: Scheduled Meds: . aspirin EC  81 mg Oral Daily  . enoxaparin (LOVENOX) injection  30 mg Subcutaneous Q24H  . furosemide  40 mg Intravenous BID  . insulin aspart  0-15 Units Subcutaneous TID WC  . insulin aspart  0-5 Units Subcutaneous QHS  . insulin aspart  3 Units Subcutaneous TID WC  . insulin detemir  10 Units Subcutaneous QHS  . ipratropium-albuterol  3 mL Nebulization TID  . isosorbide mononitrate  30 mg Oral Daily  . latanoprost  1 drop Both Eyes QHS  . sodium chloride flush  3 mL Intravenous Q12H  . tamsulosin  0.4 mg Oral Daily   Continuous Infusions: . sodium chloride     PRN Meds: sodium chloride, acetaminophen, albuterol, ondansetron (ZOFRAN) IV, sodium chloride  flush  Allergies:    Allergies  Allergen Reactions  . Beta Adrenergic Blockers     History of AV block and bradycardia -> no true allergy, just need to avoid  . Codeine Nausea And Vomiting and Other (See Comments)    hallucinations  . Microzide [Hydrochlorothiazide] Other (See Comments)    Acute gout on LosartanHCT 12/2014  . Ace Inhibitors Other (See Comments)     cough    Social History:   Social History   Socioeconomic History  . Marital status: Widowed    Spouse name: Not on file  . Number of children: Not on file  . Years of education: Not on file  . Highest education level: Not on file  Occupational History  . Occupation: retired  Tobacco Use  . Smoking status: Never Smoker  . Smokeless tobacco: Never Used  Vaping Use  . Vaping Use: Never used  Substance and Sexual Activity  . Alcohol use: No  . Drug use: No  . Sexual activity: Yes    Birth control/protection: None  Other Topics Concern  . Not on file  Social History Narrative  . Not on file   Social Determinants of Health   Financial Resource Strain:   . Difficulty of Paying Living Expenses:   Food Insecurity:   . Worried About Charity fundraiser in the Last Year:   . Arboriculturist in the Last Year:   Transportation Needs:   . Film/video editor (Medical):   Marland Kitchen Lack of Transportation (Non-Medical):   Physical Activity:   . Days of Exercise per Week:   . Minutes of Exercise per Session:   Stress:   . Feeling of Stress :   Social Connections:   . Frequency of Communication with Friends and Family:   . Frequency of Social Gatherings with Friends and Family:   . Attends Religious Services:   . Active Member of Clubs or Organizations:   . Attends Archivist Meetings:   Marland Kitchen Marital Status:   Intimate Partner Violence:   . Fear of Current or Ex-Partner:   . Emotionally Abused:   Marland Kitchen Physically Abused:   . Sexually Abused:  Family History:    Family History  Problem Relation Age of  Onset  . Diabetes Sister   . Cancer Neg Hx   . Heart disease Neg Hx   . Stroke Neg Hx      ROS:  Please see the history of present illness.  All other ROS reviewed and negative although patient unreliable due to confusion  Physical Exam/Data:   Vitals:   12/28/19 0013 12/28/19 0427 12/28/19 0436 12/28/19 0925  BP: (!) 189/111 (!) 159/101    Pulse: 60 49  60  Resp: 20 20    Temp: (!) 97.5 F (36.4 C) 98 F (36.7 C)    TempSrc: Oral Oral    SpO2: 99% 100%    Weight:   64.9 kg   Height:        Intake/Output Summary (Last 24 hours) at 12/28/2019 1028 Last data filed at 12/28/2019 0916 Gross per 24 hour  Intake 910 ml  Output 975 ml  Net -65 ml   Last 3 Weights 12/28/2019 12/27/2019 12/11/2019  Weight (lbs) 143 lb 1.3 oz 145 lb 1 oz 137 lb  Weight (kg) 64.9 kg 65.8 kg 62.143 kg     Body mass index is 24.56 kg/m.  General: Well developed, well nourished elderly WM, in no acute distress Head: Normocephalic, atraumatic, sclera non-icteric, no xanthomas, nares are without discharge. Neck: Negative for carotid bruits. JVP mildly elevated. Lungs: Diffusely diminished with occasional end expiratory wheezing and crackles at bases. Breathing is unlabored. Heart: RRR S1 S2 without murmurs, rubs, or gallops.  Abdomen: Soft, non-tender, non-distended with normoactive bowel sounds. No rebound/guarding. Extremities: No clubbing or cyanosis. No edema. Distal pedal pulses are 2+ and equal bilaterally. Neuro: Alert and oriented to self, June 2021, cannot tell me where he is although hinted towards hospital. Tangential, difficult to understand at times given cadence of speech. Moves all extremities spontaneously. Psych:  Lively affect.   EKG:  The EKG was personally reviewed and demonstrates:  NSR 65bpm with first degree AVB, NSVICD known from prior, possible prior anterior infarct, prolonged QTC 567ms with nonspecific TW changes  Telemetry:  Telemetry was personally reviewed and  demonstrates:  NSR with frequent PVCs, occasional bigeminy/trigeminy  Relevant CV Studies: 2D echo 11/13/19 1. Left ventricular ejection fraction, by estimation, is 40 to 45%. The  left ventricle has mildly decreased function. There is mild left  ventricular hypertrophy. Left ventricular diastolic parameters are  indeterminate.  2. Right ventricular systolic function is moderately reduced. The right  ventricular size is normal. Tricuspid regurgitation signal is inadequate  for assessing PA pressure.  3. Left atrial size was moderately dilated.  4. Right atrial size was mildly dilated.  5. The mitral valve is degenerative. Mild mitral valve regurgitation.  6. The aortic valve was not well visualized. Aortic valve regurgitation  is not visualized. Mild aortic valve sclerosis is present, with no  evidence of aortic valve stenosis.  7. Aortic dilatation noted. There is dilatation of the ascending aorta  measuring 39 mm.  8. The inferior vena cava is dilated in size with >50% respiratory  variability, suggesting right atrial pressure of 8 mmHg.   Laboratory Data:  High Sensitivity Troponin:  No results for input(s): TROPONINIHS in the last 720 hours.   Chemistry Recent Labs  Lab 12/27/19 1140 12/28/19 0419  NA 137 136  K 5.1 4.7  CL 102 101  CO2 22 23  GLUCOSE 226* 232*  BUN 30* 40*  CREATININE 1.57* 1.66*  CALCIUM 8.4*  8.2*  GFRNONAA 36* 34*  GFRAA 42* 39*  ANIONGAP 13 12    Recent Labs  Lab 12/27/19 1140 12/28/19 0419  PROT 6.7 6.5  ALBUMIN 3.8 3.5  AST 80* 30  ALT 57* 45*  ALKPHOS 140* 124  BILITOT 0.7 0.5   Hematology Recent Labs  Lab 12/27/19 1140  WBC 4.8  RBC 4.54  HGB 13.1  HCT 40.3  MCV 88.8  MCH 28.9  MCHC 32.5  RDW 15.5  PLT 126*   BNP Recent Labs  Lab 12/27/19 1140  BNP 1,367.9*    DDimer No results for input(s): DDIMER in the last 168 hours.   Radiology/Studies:  DG Chest 2 View  Result Date: 12/27/2019 CLINICAL DATA:   Shortness of breath since last night, decreased oxygen saturation, increased work of breathing EXAM: CHEST - 2 VIEW COMPARISON:  11/12/2019 FINDINGS: Enlargement of cardiac silhouette post CABG. Pulmonary vascular congestion. Hazy BILATERAL pulmonary infiltrates slightly greater on RIGHT, question pulmonary edema though infection not completely excluded. No pleural effusion or pneumothorax. Osseous structures unremarkable. IMPRESSION: Enlargement of cardiac silhouette with pulmonary vascular congestion and BILATERAL pulmonary infiltrates favor pulmonary edema. Electronically Signed   By: Lavonia Dana M.D.   On: 12/27/2019 12:44   { New York Heart Association (NYHA) Functional Class NYHA Class III  Assessment and Plan:   1. Acute hypoxic respiratory failure likely due to acute on chronic systolic CHF - he is still about 7lb up from baseline weight so would continue diuresis as ordered - agree with palliative care consult, would be appropriate to try and help this gentleman achieve the care he needs at home without having to require re-hospitalization, where he has been known to become disoriented - see #2 re: BP meds  2. Labile hypertension - recently 80/40 in outpatient setting, now consistently hypertensive. - continue IV Lasix as ordered - continue home Imdur - stop carvedilol as below (started 7/22) - hold losartan given uptrending Cr, no spironolactone given CKD and upper limit potassium level - will discuss potential addition of hydralazine with MD  3. Prolonged QT interval, PVCs - f/u QTC on tele 513ms in setting of NSIVCD. - K, Mg OK - avoid AVN blocking and QT prolonging agents  4. H/o conduction disease and bradycardia - per last admission, hopeful to avoid need for PPM so need to avoid AVN blocking agents. - stop newly initiated carvedilol - added beta blockers to allergy list so this prompts future users to be aware of need to avoid - follow on telemetry  5. CKD stage III -  baseline 1.3-1.6, up to 1.66 today. May need to tolerate higher creatinine for euvolemia. - continue to monitor - agree with holding losartan during need for further diuresis  6. H/o CAD s/p CABG - continue ASA as tolerated - avoid BB - not on statin at home in context of advanced age  For questions or updates, please contact [facility] Please consult [email or web address] for contact info under    Signed, Charlie Pitter, PA-C  12/28/2019 10:28 AM  Patient seen and examined with Melina Copa PA-C.  Agree as above, with the following exceptions and changes as noted below. He is delirious at the moment and unable to provide a history. 86 yo male with hx of CAD, CKD, DM, HTN, HLD, prostate CA, Covid in January. Noted to have CHF with 7 lb up from baseline. Gen: delirious, yelling, CV: RRR, no murmurs, Lungs: clear, Abd: soft, Extrem: Warm, well perfused, no edema, Neuro/Psych:  delirious. All available labs, radiology testing, previous records reviewed.  Mr. Fedak has waxing and waning mental status, and I agree with palliative care consultation for goals of care and symptom management.  Reasonable to continue diuresis, however overall medical picture suggest he may benefit most from palliative care management of medications.  I agree with medications noted above, avoid beta-blockade given previous issues with concern for conduction system disease.  Consider adding hydralazine for blood pressure management.  This is a complex situation, however no further cardiovascular interventions are required inpatient.  We will sign off at this time and recommend primary service continue diuresis.  If the patient's goals of care are more for symptom management, cardiovascular follow-up is not required.  If patient and family would prefer cardiovascular follow-up in the future, please contact our service closer to hospital discharge and we would be happy to arrange this.   Elouise Munroe, MD 12/28/19 3:41 PM

## 2019-12-28 NOTE — Progress Notes (Signed)
Inpatient Diabetes Program Recommendations  AACE/ADA: New Consensus Statement on Inpatient Glycemic Control (2015)  Target Ranges:  Prepandial:   less than 140 mg/dL      Peak postprandial:   less than 180 mg/dL (1-2 hours)      Critically ill patients:  140 - 180 mg/dL   Lab Results  Component Value Date   GLUCAP 126 (H) 12/28/2019   HGBA1C 9.3 (H) 12/28/2019    Review of Glycemic Control  Diabetes history: DM2 Outpatient Diabetes medications: Basaglar 9 units QD, Humalog 3-6 units tid ac meals, Glucerna 237 ml QD Current orders for Inpatient glycemic control: Levemir 10 units QHS, Novolog 0-15 units tidwc and hs  On Enlive 237 mL bid between meals CBGs 169, 126 mg/dL today HgbA1C - 9.3%  Not appropriate for education  Inpatient Diabetes Program Recommendations:     Agree with orders.  If post-prandials > 200 mg/dL, add Novolog 2 units tidwc if appropriate.  Thank you. Lorenda Peck, RD, LDN, CDE Inpatient Diabetes Coordinator [phone]

## 2019-12-28 NOTE — Consult Note (Signed)
Consultation Note Date: 12/28/2019   Patient Name: Leonard [name]  DOB: [birth year]  MRN: [id number]  Age / Sex: 86 y.o., male  PCP: Hoyt Koch, MD Referring Physician: Patrecia Pour, MD  Reason for Consultation: Establishing goals of care  HPI/Patient Profile: 86 y.o. male  admitted on 12/27/2019   Leonard [name] is a 86 y.o. male with a hx of CAD s/p CABG 1984 & 1997, CKD stage III (baseline Cr appears 1.3-1.6), carotid artery disease, DM with peripheral neuropathy, HTN, HLD, glaucoma, prostate CA s/p XRT, prior snycope felt due to dehydration/bradycardia, thrombocytopenia, recent underlying conduction disease, chronic systolic CHF, COVID PNA 38/1829, mild dilation of ascending aorta admitted for evaluation of CHF.   Clinical Assessment and Goals of Care:  patient remains admitted for acute hypoxic respiratory failure due to acute on chronic systolic CHF. Also with labile HTN, III CKD. Ongoing bradycardia history of conduction disease.   A palliative consult has been requested for goals of care discussions.   Palliative medicine is specialized medical care for people living with serious illness. It focuses on providing relief from the symptoms and stress of a serious illness. The goal is to improve quality of life for both the patient and the family.  Goals of care: Broad aims of medical therapy in relation to the patient's values and preferences. Our aim is to provide medical care aimed at enabling patients to achieve the goals that matter most to them, given the circumstances of their particular medical situation and their constraints.   Patient is somewhat confused, talks about some operation going wrong in [place]. Call placed, discussed with daughter. Patient's wife died in 02/23/19, he has a daughter in [place], up until a few months ago, he was active, used to exercise on his  stationary bike. He was under evaluation to be moved to memory care unit from his assisted living facility.   We discussed about continuing current mode of care and addition of palliative services as an extra layer of support. We talked about CHF trajectory of decline.   See below.   NEXT OF KIN  daughter, son in law.   SUMMARY OF RECOMMENDATIONS    Agree with DNR Recommend palliative services following with the patient upon discharge, patient's daughter states that she was in the process of considering memory care unit transfer.  Recently did a SNF rehab attempt, is from assisted living [facility] facility.   Code Status/Advance Care Planning:  DNR    Symptom Management:    as above.   Palliative Prophylaxis:   Delirium Protocol   Psycho-social/Spiritual:   Desire for further Chaplaincy support:yes  Additional Recommendations: Caregiving  Support/Resources  Prognosis:   Unable to determine Memory care unit with palliative services following.   Discharge Planning:       Primary Diagnoses: Present on Admission: . Acute on chronic systolic CHF (congestive heart failure) ([place])   I have reviewed the medical record, interviewed the patient and family, and examined the patient. The following  aspects are pertinent.  Past Medical History:  Diagnosis Date  . Bladder tumor    hematuria  . Carotid stenosis, bilateral 08/20/2009   mild  . Chronic systolic CHF (congestive heart failure) ([place])   . CKD (chronic kidney disease), stage III   . Coronary artery disease    A. S/P CABG w/ Redo CABG in 1997;  b. 2005 Cath: LIMA->LAD ok, VG->OM ok, VG->RPDA->RPLV ok. Severe native 3VD.  Marland Kitchen DDD (degenerative disc disease), lumbar    s/p fusion 4-5  . Diabetes mellitus   . Diabetic peripheral neuropathy (HCC)    feet  . Glaucoma bilateral    eye drop rx  . Heart block    a. h/o known conduction disease with first degree AV block, Wenkebach, RBBB, LAFB -> advised to  avoid AVN blocking agents.  . History of hiatal hernia    denies reflux  . HOH (hard of hearing) no hearing aids  . Hypertension   . Mass of skin of right shoulder 03/12/2017  . Mild dilation of ascending aorta (HCC)   . Nocturia   . Pancytopenia ([place])    noted on labs  . Pneumonia due to COVID-19 virus 06/2019  . Prostate cancer [facility]) 1996   s/p external radiation  . Renal insufficiency hx  . Syncope 06/06/2011   In context of dehydration and bradycardia.  . Thrombocytopenia ([place]) 03/12/2017   Social History   Socioeconomic History  . Marital status: Widowed    Spouse name: Not on file  . Number of children: Not on file  . Years of education: Not on file  . Highest education level: Not on file  Occupational History  . Occupation: retired  Tobacco Use  . Smoking status: Never Smoker  . Smokeless tobacco: Never Used  Vaping Use  . Vaping Use: Never used  Substance and Sexual Activity  . Alcohol use: No  . Drug use: No  . Sexual activity: Yes    Birth control/protection: None  Other Topics Concern  . Not on file  Social History Narrative  . Not on file   Social Determinants of Health   Financial Resource Strain:   . Difficulty of Paying Living Expenses:   Food Insecurity:   . Worried About Charity fundraiser in the Last Year:   . Arboriculturist in the Last Year:   Transportation Needs:   . Film/video editor (Medical):   Marland Kitchen Lack of Transportation (Non-Medical):   Physical Activity:   . Days of Exercise per Week:   . Minutes of Exercise per Session:   Stress:   . Feeling of Stress :   Social Connections:   . Frequency of Communication with Friends and Family:   . Frequency of Social Gatherings with Friends and Family:   . Attends Religious Services:   . Active Member of Clubs or Organizations:   . Attends Archivist Meetings:   Marland Kitchen Marital Status:    Family History  Problem Relation Age of Onset  . Diabetes Sister   . Cancer Neg Hx   .  Heart disease Neg Hx   . Stroke Neg Hx    Scheduled Meds: . aspirin EC  81 mg Oral Daily  . enoxaparin (LOVENOX) injection  30 mg Subcutaneous Q24H  . furosemide  40 mg Intravenous BID  . insulin aspart  0-15 Units Subcutaneous TID WC  . insulin aspart  0-5 Units Subcutaneous QHS  . insulin aspart  3 Units Subcutaneous TID  WC  . insulin detemir  10 Units Subcutaneous QHS  . ipratropium-albuterol  3 mL Nebulization TID  . isosorbide mononitrate  30 mg Oral Daily  . latanoprost  1 drop Both Eyes QHS  . sodium chloride flush  3 mL Intravenous Q12H  . tamsulosin  0.4 mg Oral Daily   Continuous Infusions: . sodium chloride     PRN Meds:.sodium chloride, acetaminophen, albuterol, ondansetron (ZOFRAN) IV, sodium chloride flush Medications Prior to Admission:  Prior to Admission medications   Medication Sig Start Date End Date Taking? Authorizing Provider  aspirin 81 MG tablet Take 81 mg by mouth daily.    Yes [provider]  diclofenac sodium (VOLTAREN) 1 % GEL Apply 2 g topically 2 (two) times daily as needed. 02/04/17  Yes Rosemarie Ax, MD  Ensure (ENSURE) Take 237 mLs by mouth daily.   Yes [provider]  feeding supplement, GLUCERNA SHAKE, (GLUCERNA SHAKE) LIQD Take 237 mLs by mouth daily.   Yes [provider]  gabapentin (NEURONTIN) 600 MG tablet Take 600 mg by mouth 3 (three) times daily.   Yes [provider]  Insulin Glargine (BASAGLAR KWIKPEN) 100 UNIT/ML SOPN Inject 9 Units into the skin daily.   Yes [provider]  insulin lispro (HUMALOG) 100 UNIT/ML injection Inject 0-6 Units into the skin 3 (three) times daily before meals. 0-200 0 units 201-260 3 units 261-320 4 units 321-380 5 units 381-600 6 units Over 300 contact dr   Yes [provider]  isosorbide mononitrate (IMDUR) 30 MG 24 hr tablet Take 1 tablet (30 mg total) by mouth daily. 11/16/19  Yes Nolberto Hanlon, MD  losartan (COZAAR) 25 MG tablet Take 1 tablet  (25 mg total) by mouth daily. 12/05/19 03/04/20 Yes Burtis Junes, NP  potassium chloride SA (KLOR-CON) 20 MEQ tablet Take 1 tablet (20 mEq total) by mouth daily. 11/19/19  Yes Nolberto Hanlon, MD  Tamsulosin HCl (FLOMAX) 0.4 MG CAPS Take 0.4 mg by mouth daily.   Yes [provider]  vitamin B-12 1000 MCG tablet Take 1 tablet (1,000 mcg total) by mouth daily. 11/16/19  Yes Nolberto Hanlon, MD  B-D INSULIN SYRINGE 29G X 1/2" 1 ML MISC  11/13/19   [provider]  ciprofloxacin (CIPRO) 250 MG tablet Take 250 mg by mouth 2 (two) times daily. 10 days supply 12/19/19 12/29/19  [provider]  furosemide (LASIX) 20 MG tablet Take 20 mg by mouth every other day, then take 40 mg by mouth on other days, alternating. Patient taking differently: Take 20 mg by mouth daily.  12/13/19   Josue Hector, MD  Insulin Pen Needle 32G X 4 MM MISC As directed per insulin dose and sliding scale 02/04/17   Rosemarie Ax, MD  latanoprost (XALATAN) 0.005 % ophthalmic solution Place 1 drop into both eyes at bedtime.    [provider]  Prodigy Lancets 28G [place]  11/13/19   [provider]   Allergies  Allergen Reactions  . Beta Adrenergic Blockers     History of AV block and bradycardia -> no true allergy, just need to avoid  . Codeine Nausea And Vomiting and Other (See Comments)    hallucinations  . Microzide [Hydrochlorothiazide] Other (See Comments)    Acute gout on LosartanHCT 12/2014  . Ace Inhibitors Other (See Comments)     cough   Review of Systems + weakness  Physical Exam Diminished breathing few wheezes No distress S1 S2 Abdomen not distended Has  edema  Vital Signs: BP (!) 161/84 (BP Location: Left Arm)   Pulse 62   Temp 97.7 F (36.5 C) (Oral)   Resp 18   Ht 5\' 4"  (1.626 m)   Wt 64.9 kg   SpO2 99%   BMI 24.56 kg/m  Pain Scale: PAINAD POSS *See Group Information*: 1-Acceptable,Awake and alert Pain Score: 0-No pain   SpO2: SpO2: 99 % O2 Device:SpO2:  99 % O2 Flow Rate: .O2 Flow Rate (L/min): 2 L/min  IO: Intake/output summary:   Intake/Output Summary (Last 24 hours) at 12/28/2019 1316 Last data filed at 12/28/2019 0916 Gross per 24 hour  Intake 910 ml  Output 975 ml  Net -65 ml    LBM: Last BM Date:  (UTA-Pt and Daughter unsure of last BM) Baseline Weight: Weight: 65.8 kg Most recent weight: Weight: 64.9 kg     Palliative Assessment/Data:   PPS 30%  Time In:  1300 Time Out:  1400 Time Total:  60 minutes.  Greater than 50%  of this time was spent counseling and coordinating care related to the above assessment and plan.  Signed by: Loistine Chance, MD   Please contact Palliative Medicine Team phone at [phone] for questions and concerns.  For individual provider: See Shea Evans

## 2019-12-28 NOTE — Progress Notes (Addendum)
PROGRESS NOTE  Leonard [name]  MWN:[id number] DOB: 06-15-1920 DOA: 12/27/2019 PCP: Hoyt Koch, MD  Outpatient Specialists: Cardiology, Dr. Johnsie Cancel. Brief Narrative: Leonard [name] is a 86 y.o. male with a history of T2DM, HTN, stage IIIa CKD, covid-19, CAD s/p CABG and redo 1997, OCM w/EF 40-45%, and bradycardia/conduction disease who presented from ALF (recently transferred from SNF) with shortness of breath, weight gain and found to be hypoxemic. He had lower extremity edema, CXR showed pulmonary edema, and BNP 1367. He was given IV lasix, and admitted for decompensated CHF with cardiology consulted.  Assessment & Plan: Active Problems:   Acute on chronic systolic CHF (congestive heart failure) (HCC)  Acute on chronic HFrEF: LVEF 40-45% - Continue IV diuresis as he remains volume up on exam and by weight - Stop BB as he's had bradycardia in the past and currently. Coreg given this AM and HR dipping into 30-40's though not hypotensive. - Holding RAAS agents with renal impairment and need for diuresis.  - Will need BP control.   HTN:  - Appreciate cardiology recommendations.  Wheezing:  - Continue duonebs scheduled and prn albuterol. Will order additional albuterol now given wheezing.   T2DM: HbA1c 9.3%. Per daughter, has had issues with hypoglycemia at ALF. - Had severe hypogylcemic episode this PM with AMS which has improved with dextrose administration. Got 5u novolog for CBG in 120's and didn't eat thereafter, also may have decreased clearance with renal impairment. Will deescalate dosing to very sensitive sliding scale and decrease long acting by 30% to 7u. Will allow permissive hyperglycemia to a certain degree.  History of UTI: Urinalysis here shows no pyuria or bacteriuria.  - Stop ciprofloxacin, especially in light of renal insufficiency and advanced age.   Cognitive impairment, NOS: No definite diagnosis of dementia, though was reportedly under consideration for  transfer to memory care.  - Will start escitalopram in the event that this is related to pseudodementia and/or prolonged grief reaction from wife's death 02-01-2019.  Thrombocytopenia: Mild, appears chronic.  - Monitor in AM, ok to give heparin for DVT ppx  AKI on stage IIIa CKD: May have progressed to stage IIIb.  - Continue monitoring, avoid nephrotoxins as above. - Hold ARB  DVT prophylaxis: Heparin 5k q8h Code Status: DNR Family Communication: None at bedside Disposition Plan:  Status is: Inpatient  Remains inpatient appropriate because:Hemodynamically unstable, Altered mental status and Inpatient level of care appropriate due to severity of illness   Dispo: The patient is from: ALF              Anticipated d/c is to: TBD, PT/OT consulted              Anticipated d/c date is: 2 days              Patient currently is not medically stable to d/c.  Consultants:   Cardiology  Palliative Care  Procedures:   None  Antimicrobials:  Cipro x1 7/22   Subjective: Difficult historian, denies any chest pain or dyspnea. Not usually on oxygen he reports, and on 2L currently. Not sure if his swelling is better or worse.   Objective: Vitals:   12/28/19 0013 12/28/19 0427 12/28/19 0436 12/28/19 0925  BP: (!) 189/111 (!) 159/101    Pulse: 60 49  60  Resp: 20 20    Temp: (!) 97.5 F (36.4 C) 98 F (36.7 C)    TempSrc: Oral Oral    SpO2: 99% 100%  Weight:   64.9 kg   Height:        Intake/Output Summary (Last 24 hours) at 12/28/2019 1017 Last data filed at 12/28/2019 0916 Gross per 24 hour  Intake 910 ml  Output 975 ml  Net -65 ml   Filed Weights   12/27/19 1545 12/28/19 0436  Weight: 65.8 kg 64.9 kg    Gen: Pleasant interactive elderly male in no distress Pulm: Non-labored breathing 2L O2, expiratory wheezing with prolonged expiration. Crackles at bases bilaterally.  CV: Regular bradycardia. No murmur, rub, or gallop. Mild JVD, 2+ pitting dependent edema. GI:  Abdomen soft, non-tender, non-distended, with normoactive bowel sounds. No organomegaly or masses felt. Ext: Warm, no deformities Skin: No rashes, lesions or ulcers Neuro: Alert and incompletely oriented. No focal neurological deficits. Psych: Judgement and insight appear impaired. Mood & affect appropriate.   Data Reviewed: I have personally reviewed following labs and imaging studies  CBC: Recent Labs  Lab 12/27/19 1140  WBC 4.8  NEUTROABS 2.0  HGB 13.1  HCT 40.3  MCV 88.8  PLT 163*   Basic Metabolic Panel: Recent Labs  Lab 12/27/19 1140 12/28/19 0419  NA 137 136  K 5.1 4.7  CL 102 101  CO2 22 23  GLUCOSE 226* 232*  BUN 30* 40*  CREATININE 1.57* 1.66*  CALCIUM 8.4* 8.2*   GFR: Estimated Creatinine Clearance: 21.3 mL/min (A) (by C-G formula based on SCr of 1.66 mg/dL (H)). Liver Function Tests: Recent Labs  Lab 12/27/19 1140 12/28/19 0419  AST 80* 30  ALT 57* 45*  ALKPHOS 140* 124  BILITOT 0.7 0.5  PROT 6.7 6.5  ALBUMIN 3.8 3.5   No results for input(s): LIPASE, AMYLASE in the last 168 hours. No results for input(s): AMMONIA in the last 168 hours. Coagulation Profile: No results for input(s): INR, PROTIME in the last 168 hours. Cardiac Enzymes: No results for input(s): CKTOTAL, CKMB, CKMBINDEX, TROPONINI in the last 168 hours. BNP (last 3 results) Recent Labs    12/11/19 1549  PROBNP 4,030*   HbA1C: Recent Labs    12/28/19 0419  HGBA1C 9.3*   CBG: Recent Labs  Lab 12/27/19 1609 12/27/19 2157 12/28/19 0745  GLUCAP 242* 317* 169*   Lipid Profile: No results for input(s): CHOL, HDL, LDLCALC, TRIG, CHOLHDL, LDLDIRECT in the last 72 hours. Thyroid Function Tests: No results for input(s): TSH, T4TOTAL, FREET4, T3FREE, THYROIDAB in the last 72 hours. Anemia Panel: No results for input(s): VITAMINB12, FOLATE, FERRITIN, TIBC, IRON, RETICCTPCT in the last 72 hours. Urine analysis:    Component Value Date/Time   COLORURINE YELLOW 12/27/2019 1412    APPEARANCEUR CLEAR 12/27/2019 1412   LABSPEC 1.011 12/27/2019 1412   PHURINE 5.0 12/27/2019 1412   GLUCOSEU NEGATIVE 12/27/2019 1412   HGBUR NEGATIVE 12/27/2019 1412   HGBUR negative 02/28/2008 0751   BILIRUBINUR NEGATIVE 12/27/2019 1412   KETONESUR NEGATIVE 12/27/2019 1412   PROTEINUR 100 (A) 12/27/2019 1412   UROBILINOGEN 0.2 07/10/2014 0039   NITRITE NEGATIVE 12/27/2019 1412   LEUKOCYTESUR NEGATIVE 12/27/2019 1412   Recent Results (from the past 240 hour(s))  SARS Coronavirus 2 by RT PCR (hospital order, performed in [facility] hospital lab) Nasopharyngeal Nasopharyngeal Swab     Status: None   Collection Time: 12/27/19 12:13 PM   Specimen: Nasopharyngeal Swab  Result Value Ref Range Status   SARS Coronavirus 2 NEGATIVE NEGATIVE Final    Comment: (NOTE) SARS-CoV-2 target nucleic acids are NOT DETECTED.  The SARS-CoV-2 RNA is generally detectable in upper  and lower respiratory specimens during the acute phase of infection. The lowest concentration of SARS-CoV-2 viral copies this assay can detect is 250 copies / mL. A negative result does not preclude SARS-CoV-2 infection and should not be used as the sole basis for treatment or other patient management decisions.  A negative result may occur with improper specimen collection / handling, submission of specimen other than nasopharyngeal swab, presence of viral mutation(s) within the areas targeted by this assay, and inadequate number of viral copies (<250 copies / mL). A negative result must be combined with clinical observations, patient history, and epidemiological information.  Fact Sheet for Patients:   StrictlyIdeas.no  Fact Sheet for Healthcare Providers: BankingDealers.co.za  This test is not yet approved or  cleared by the [place] FDA and has been authorized for detection and/or diagnosis of SARS-CoV-2 by FDA under an Emergency Use Authorization (EUA).  This EUA  will remain in effect (meaning this test can be used) for the duration of the COVID-19 declaration under Section 564(b)(1) of the Act, 21 U.S.C. section 360bbb-3(b)(1), unless the authorization is terminated or revoked sooner.  Performed at [facility], [place] [address], [place], [place] [address]   MRSA PCR Screening     Status: None   Collection Time: 12/27/19  6:06 PM   Specimen: Nasal Mucosa; Nasopharyngeal  Result Value Ref Range Status   MRSA by PCR NEGATIVE NEGATIVE Final    Comment:        The GeneXpert MRSA Assay (FDA approved for NASAL specimens only), is one component of a comprehensive MRSA colonization surveillance program. It is not intended to diagnose MRSA infection nor to guide or monitor treatment for MRSA infections. Performed at [facility], [place] [address], [place], [place] [address]       Radiology Studies: DG Chest 2 View  Result Date: 12/27/2019 CLINICAL DATA:  Shortness of breath since last night, decreased oxygen saturation, increased work of breathing EXAM: CHEST - 2 VIEW COMPARISON:  11/12/2019 FINDINGS: Enlargement of cardiac silhouette post CABG. Pulmonary vascular congestion. Hazy BILATERAL pulmonary infiltrates slightly greater on RIGHT, question pulmonary edema though infection not completely excluded. No pleural effusion or pneumothorax. Osseous structures unremarkable. IMPRESSION: Enlargement of cardiac silhouette with pulmonary vascular congestion and BILATERAL pulmonary infiltrates favor pulmonary edema. Electronically Signed   By: Lavonia Dana M.D.   On: 12/27/2019 12:44    Scheduled Meds: . aspirin EC  81 mg Oral Daily  . enoxaparin (LOVENOX) injection  30 mg Subcutaneous Q24H  . furosemide  40 mg Intravenous BID  . insulin aspart  0-15 Units Subcutaneous TID WC  . insulin aspart  0-5 Units Subcutaneous QHS  . insulin aspart  3 Units Subcutaneous TID WC  . insulin detemir  10 Units Subcutaneous QHS  .  ipratropium-albuterol  3 mL Nebulization QID  . isosorbide mononitrate  30 mg Oral Daily  . latanoprost  1 drop Both Eyes QHS  . sodium chloride flush  3 mL Intravenous Q12H  . tamsulosin  0.4 mg Oral Daily   Continuous Infusions: . sodium chloride       LOS: 1 day   Time spent: 35 minutes.  Patrecia Pour, MD [facility] [email or web address] 12/28/2019, 10:17 AM

## 2019-12-29 ENCOUNTER — Inpatient Hospital Stay (HOSPITAL_COMMUNITY): Payer: Medicare Other

## 2019-12-29 LAB — CBC
HCT: 38.4 % — ABNORMAL LOW (ref 39.0–52.0)
Hemoglobin: 12.7 g/dL — ABNORMAL LOW (ref 13.0–17.0)
MCH: 28.9 pg (ref 26.0–34.0)
MCHC: 33.1 g/dL (ref 30.0–36.0)
MCV: 87.3 fL (ref 80.0–100.0)
Platelets: 128 10*3/uL — ABNORMAL LOW (ref 150–400)
RBC: 4.4 MIL/uL (ref 4.22–5.81)
RDW: 15.5 % (ref 11.5–15.5)
WBC: 12.6 10*3/uL — ABNORMAL HIGH (ref 4.0–10.5)
nRBC: 0 % (ref 0.0–0.2)

## 2019-12-29 LAB — BASIC METABOLIC PANEL
Anion gap: 13 (ref 5–15)
BUN: 40 mg/dL — ABNORMAL HIGH (ref 8–23)
CO2: 25 mmol/L (ref 22–32)
Calcium: 7.9 mg/dL — ABNORMAL LOW (ref 8.9–10.3)
Chloride: 102 mmol/L (ref 98–111)
Creatinine, Ser: 1.71 mg/dL — ABNORMAL HIGH (ref 0.61–1.24)
GFR calc Af Amer: 38 mL/min — ABNORMAL LOW (ref >60)
GFR calc non Af Amer: 33 mL/min — ABNORMAL LOW (ref >60)
Glucose, Bld: 104 mg/dL — ABNORMAL HIGH (ref 70–99)
Potassium: 3.6 mmol/L (ref 3.5–5.1)
Sodium: 140 mmol/L (ref 135–145)

## 2019-12-29 LAB — GLUCOSE, CAPILLARY
Glucose-Capillary: 107 mg/dL — ABNORMAL HIGH (ref 70–99)
Glucose-Capillary: 121 mg/dL — ABNORMAL HIGH (ref 70–99)
Glucose-Capillary: 312 mg/dL — ABNORMAL HIGH (ref 70–99)
Glucose-Capillary: 309 mg/dL — ABNORMAL HIGH (ref 70–99)
Glucose-Capillary: 343 mg/dL — ABNORMAL HIGH (ref 70–99)

## 2019-12-29 MED ORDER — ISOSORBIDE MONONITRATE ER 60 MG PO TB24
60.0000 mg | ORAL_TABLET | Freq: Every day | ORAL | Status: DC
  Filled 2019-12-29: qty 1

## 2019-12-29 MED ORDER — ISOSORBIDE MONONITRATE ER 30 MG PO TB24
30.0000 mg | ORAL_TABLET | Freq: Once | ORAL | Status: AC
  Administered 2019-12-29: 30 mg via ORAL
  Filled 2019-12-29: qty 1

## 2019-12-29 MED ORDER — HALOPERIDOL LACTATE 5 MG/ML IJ SOLN
2.0000 mg | Freq: Once | INTRAMUSCULAR | Status: AC
  Administered 2019-12-29: 2 mg via INTRAVENOUS
  Filled 2019-12-29: qty 1

## 2019-12-29 MED ORDER — LORAZEPAM 2 MG/ML IJ SOLN
0.5000 mg | Freq: Once | INTRAMUSCULAR | Status: AC
  Administered 2019-12-29: 0.5 mg via INTRAVENOUS
  Filled 2019-12-29: qty 1

## 2019-12-29 MED ORDER — FUROSEMIDE 10 MG/ML IJ SOLN
40.0000 mg | Freq: Once | INTRAMUSCULAR | Status: AC
  Administered 2019-12-29: 40 mg via INTRAVENOUS
  Filled 2019-12-29: qty 4

## 2019-12-29 MED ORDER — DEXTROSE-NACL 5-0.9 % IV SOLN: INTRAVENOUS | Status: DC

## 2019-12-29 NOTE — Progress Notes (Addendum)
Patient still confuse and combative. Haldol not effective. N.P informed again. Restraints ordered. Bilateral Wrist Restraints applied but patient still kicking staff.  N.P. Informed. Family informed. Waiting for new orders.

## 2019-12-29 NOTE — Progress Notes (Addendum)
No new orders. Pt stil agitated and confuse. Unable to put oxygen and Tele on. Pt desat 85-88%. N.P aware. Will continue to monitor.

## 2019-12-29 NOTE — Progress Notes (Signed)
Patient very confuse and combative. Security called to transfer the patient to the bed. N.P. Informed. Ordered haldol 2mg . Will continue to monitor.

## 2019-12-29 NOTE — Progress Notes (Signed)
PROGRESS NOTE  Leonard [name]  VXB:[id number] DOB: [birth year] DOA: 12/27/2019 PCP: Hoyt Koch, MD  Outpatient Specialists: Cardiology, Dr. Johnsie Cancel. Brief Narrative: Leonard [name] is a 86 y.o. male with a history of T2DM, HTN, stage IIIa CKD, covid-19, CAD s/p CABG 1984 and redo 1997, ICM w/EF 40-45%, and bradycardia/conduction disease who presented from ALF (recently transferred from SNF) with shortness of breath, weight gain and found to be hypoxemic. He had lower extremity edema, CXR showed pulmonary edema, and BNP 1367. He was given IV lasix, and admitted for decompensated CHF with cardiology consulted.  Assessment & Plan: Active Problems:   Acute on chronic systolic CHF (congestive heart failure) (HCC)  Acute on chronic HFrEF: LVEF 40-45%. - Continue IV lasix for today with plans to transition to po 7/25. Negative 1.5L, weight down accordingly to 64.9kg. Unclear EDW though he was 58.1kg 6/15 at DC from CHF admission. - Stop BB as he's had bradycardia in the past and currently.  - Holding RAAS agents with renal impairment and need for diuresis.  - Will need BP control, increase imdur for combined preload/afterload reduction.   HTN:  - Increase imdur as above. Avoid negative chronotropes.   Wheezing:  - Continue duonebs scheduled and prn albuterol, though suspect much of this is upper airway. Will get SLP evaluation.  Leukocytosis: ?if reactive to hypoglycemic episode vs. aspiration event. CXR without evidence of aspiration this AM on my personal review.  - Monitor in AM. If febrile, would culture and revisit exam.   T2DM: HbA1c 9.3%. Has had consistent issues with hypoglycemia and based on age alone, we will plan on permissive hyperglycemia.  - Custom sliding scale ordered to no insulin to be given if 200mg /dl, DC long-acting insulin.    History of UTI: Urinalysis here shows no pyuria or bacteriuria.  - Stopped ciprofloxacin, especially in light of renal insufficiency  and advanced age.   Acute delirium on chronic, progressive cognitive impairment, NOS: No definite diagnosis of dementia, though was reportedly under consideration for transfer to memory care.  - Delirium precautions - Avoid sedating medications - Avoid hypoglycemia and uremia - Sitter if needed is preferred due to lack of agitation. - Low dose escitalopram in the event that this is related to pseudodementia and/or prolonged grief reaction from wife's death 01-30-19.  Thrombocytopenia: Mild, appears chronic, stable.  AKI on stage IIIb CKD: Could possibly have progressed to stage IV CKD.  - Continue diuresis. SCr is reasonably stable.  - Continue monitoring, avoid nephrotoxins as above. - Hold ARB  DVT prophylaxis: Heparin 5k q8h Code Status: DNR Family Communication: Called daughter and SIL. Disposition Plan:  Status is: Inpatient  Remains inpatient appropriate because:Hemodynamically unstable, Altered mental status and Inpatient level of care appropriate due to severity of illness   Dispo: The patient is from: ALF              Anticipated d/c is to: SNF recommended by therapy based on current functional status.               Anticipated d/c date is: 2 days              Patient currently is not medically stable to d/c.  Consultants:   Cardiology  Palliative Care  Procedures:   None  Antimicrobials:  Cipro x1 7/22   Subjective: Had significant hypoglycemic episode yesterday PM to 27mg /dl, alertness has improved since then, though he's growing most restless and incessantly trying to get out of the  bed, forgetful. Denies any dyspnea, chest pain, or cough/fever. CBG 121 this AM.   Objective: Vitals:   12/29/19 0205 12/29/19 0631 12/29/19 0701 12/29/19 1249  BP:  (!) 146/88  (!) 158/86  Pulse:  74  81  Resp: 20 20  20   Temp:  99.1 F (37.3 C)    TempSrc:  Oral    SpO2:  98%  100%  Weight:   65.6 kg   Height:        Intake/Output Summary (Last 24 hours) at  12/29/2019 1549 Last data filed at 12/29/2019 1450 Gross per 24 hour  Intake 440 ml  Output 2002 ml  Net -1562 ml   Filed Weights   12/27/19 1545 12/28/19 0436 12/29/19 0701  Weight: 65.8 kg 64.9 kg 65.6 kg   Gen: 86 y.o. male in no distress Pulm: Nonlabored breathing supplemental oxygen. Crackles at bases improved. CV: Regular borderline bradycardia. No murmur, rub, or gallop. No JVD, 1+ improved dependent edema. GI: Abdomen soft, non-tender, non-distended, with normoactive bowel sounds.  Ext: Warm, no deformities Skin: No new rashes, lesions or ulcers on visualized skin. Neuro: Alert and incompletely oriented. Follows commands, requires reorientation frequently. Psych: Judgement and insight appear impaired. Mood euthymic & affect congruent. Behavior is appropriate.    Data Reviewed: I have personally reviewed following labs and imaging studies  CBC: Recent Labs  Lab 12/27/19 1140 12/29/19 0503  WBC 4.8 12.6*  NEUTROABS 2.0  --   HGB 13.1 12.7*  HCT 40.3 38.4*  MCV 88.8 87.3  PLT 126* 423*   Basic Metabolic Panel: Recent Labs  Lab 12/27/19 1140 12/28/19 0419 12/28/19 0433 12/29/19 0503  NA 137 136  --  140  K 5.1 4.7  --  3.6  CL 102 101  --  102  CO2 22 23  --  25  GLUCOSE 226* 232*  --  104*  BUN 30* 40*  --  40*  CREATININE 1.57* 1.66*  --  1.71*  CALCIUM 8.4* 8.2*  --  7.9*  MG  --   --  2.4  --    GFR: Estimated Creatinine Clearance: 20.7 mL/min (A) (by C-G formula based on SCr of 1.71 mg/dL (H)). Liver Function Tests: Recent Labs  Lab 12/27/19 1140 12/28/19 0419  AST 80* 30  ALT 57* 45*  ALKPHOS 140* 124  BILITOT 0.7 0.5  PROT 6.7 6.5  ALBUMIN 3.8 3.5   No results for input(s): LIPASE, AMYLASE in the last 168 hours. No results for input(s): AMMONIA in the last 168 hours. Coagulation Profile: No results for input(s): INR, PROTIME in the last 168 hours. Cardiac Enzymes: No results for input(s): CKTOTAL, CKMB, CKMBINDEX, TROPONINI in the last  168 hours. BNP (last 3 results) Recent Labs    12/11/19 1549  PROBNP 4,030*   HbA1C: Recent Labs    12/28/19 0419  HGBA1C 9.3*   CBG: Recent Labs  Lab 12/28/19 2119 12/28/19 2322 12/29/19 0524 12/29/19 0724 12/29/19 1207  GLUCAP 128* 91 107* 121* 312*   Lipid Profile: No results for input(s): CHOL, HDL, LDLCALC, TRIG, CHOLHDL, LDLDIRECT in the last 72 hours. Thyroid Function Tests: No results for input(s): TSH, T4TOTAL, FREET4, T3FREE, THYROIDAB in the last 72 hours. Anemia Panel: No results for input(s): VITAMINB12, FOLATE, FERRITIN, TIBC, IRON, RETICCTPCT in the last 72 hours. Urine analysis:    Component Value Date/Time   COLORURINE YELLOW 12/27/2019 1412   APPEARANCEUR CLEAR 12/27/2019 1412   LABSPEC 1.011 12/27/2019 1412   PHURINE 5.0 12/27/2019  [place] 12/27/2019 [place] 12/27/2019 1412   HGBUR negative 02/28/2008 0751   BILIRUBINUR NEGATIVE 12/27/2019 1412   KETONESUR NEGATIVE 12/27/2019 1412   PROTEINUR 100 (A) 12/27/2019 1412   UROBILINOGEN 0.2 07/10/2014 0039   NITRITE NEGATIVE 12/27/2019 1412   LEUKOCYTESUR NEGATIVE 12/27/2019 1412   Recent Results (from the past 240 hour(s))  SARS Coronavirus 2 by RT PCR (hospital order, performed in [facility] hospital lab) Nasopharyngeal Nasopharyngeal Swab     Status: None   Collection Time: 12/27/19 12:13 PM   Specimen: Nasopharyngeal Swab  Result Value Ref Range Status   SARS Coronavirus 2 NEGATIVE NEGATIVE Final    Comment: (NOTE) SARS-CoV-2 target nucleic acids are NOT DETECTED.  The SARS-CoV-2 RNA is generally detectable in upper and lower respiratory specimens during the acute phase of infection. The lowest concentration of SARS-CoV-2 viral copies this assay can detect is 250 copies / mL. A negative result does not preclude SARS-CoV-2 infection and should not be used as the sole basis for treatment or other patient management decisions.  A negative result may occur  with improper specimen collection / handling, submission of specimen other than nasopharyngeal swab, presence of viral mutation(s) within the areas targeted by this assay, and inadequate number of viral copies (<250 copies / mL). A negative result must be combined with clinical observations, patient history, and epidemiological information.  Fact Sheet for Patients:   StrictlyIdeas.no  Fact Sheet for Healthcare Providers: BankingDealers.co.za  This test is not yet approved or  cleared by the [place] FDA and has been authorized for detection and/or diagnosis of SARS-CoV-2 by FDA under an Emergency Use Authorization (EUA).  This EUA will remain in effect (meaning this test can be used) for the duration of the COVID-19 declaration under Section 564(b)(1) of the Act, 21 U.S.C. section 360bbb-3(b)(1), unless the authorization is terminated or revoked sooner.  Performed at [facility], [place] [address], [place], [place] [address]   MRSA PCR Screening     Status: None   Collection Time: 12/27/19  6:06 PM   Specimen: Nasal Mucosa; Nasopharyngeal  Result Value Ref Range Status   MRSA by PCR NEGATIVE NEGATIVE Final    Comment:        The GeneXpert MRSA Assay (FDA approved for NASAL specimens only), is one component of a comprehensive MRSA colonization surveillance program. It is not intended to diagnose MRSA infection nor to guide or monitor treatment for MRSA infections. Performed at [facility], [place] [address], [place], [place] [address]       Radiology Studies: DG CHEST PORT 1 VIEW  Result Date: 12/29/2019 CLINICAL DATA:  Acute on chronic systolic and diastolic congestive heart failure. EXAM: PORTABLE CHEST 1 VIEW COMPARISON:  12/27/2019 FINDINGS: Status post median sternotomy and CABG procedure. Mild cardiac enlargement, stable. Interval improvement in pulmonary edema. No new findings.  IMPRESSION: Improving pulmonary edema. Electronically Signed   By: Kerby Moors M.D.   On: 12/29/2019 10:40    Scheduled Meds:  aspirin EC  81 mg Oral Daily   escitalopram  5 mg Oral Daily   feeding supplement (ENSURE ENLIVE)  237 mL Oral BID BM   furosemide  40 mg Intravenous BID   heparin injection (subcutaneous)  5,000 Units Subcutaneous Q8H   insulin aspart  0-4 Units Subcutaneous TID WC   ipratropium-albuterol  3 mL Nebulization TID   isosorbide mononitrate  30 mg Oral Daily   latanoprost  1  drop Both Eyes QHS   sodium chloride flush  3 mL Intravenous Q12H   tamsulosin  0.4 mg Oral Daily   Continuous Infusions:  sodium chloride       LOS: 2 days   Time spent: 35 minutes.  Patrecia Pour, MD [facility] [email or web address] 12/29/2019, 3:49 PM

## 2019-12-29 NOTE — Progress Notes (Signed)
PROGRESS NOTE  Leonard [name]  GNF:[id number] DOB: May 14, 1921 DOA: 12/27/2019 PCP: Hoyt Koch, MD  Outpatient Specialists: Cardiology, Dr. Johnsie Cancel. Brief Narrative: Leonard [name] is a 86 y.o. male with a history of T2DM, HTN, stage IIIa CKD, covid-19, CAD s/p CABG 1984 and redo 1997, ICM w/EF 40-45%, and bradycardia/conduction disease who presented from ALF (recently transferred from SNF) with shortness of breath, weight gain and found to be hypoxemic. He had lower extremity edema, CXR showed pulmonary edema, and BNP 1367. He was given IV lasix, and admitted for decompensated CHF with cardiology consulted.  Assessment & Plan: Active Problems:   Acute on chronic systolic CHF (congestive heart failure) (HCC)  Acute on chronic HFrEF: LVEF 40-45%. - Continue IV lasix for today with plans to transition to po 7/25. Negative 1.5L, weight down accordingly to 64.9kg. Unclear EDW though he was 58.1kg 6/15 at DC from CHF admission. - Stop BB as he's had bradycardia in the past and currently.  - Holding RAAS agents with renal impairment and need for diuresis.  - Will need BP control, increase imdur for combined preload/afterload reduction.   HTN:  - Increase imdur as above. Avoid negative chronotropes.   Wheezing:  - Continue duonebs scheduled and prn albuterol, though suspect much of this is upper airway. Will get SLP evaluation.  Leukocytosis: ?if reactive to hypoglycemic episode vs. aspiration event. CXR without evidence of aspiration this AM on my personal review.  - Monitor in AM. If febrile, would culture and revisit exam.   T2DM: HbA1c 9.3%. Has had consistent issues with hypoglycemia and based on age alone, we will plan on permissive hyperglycemia.  - Custom sliding scale ordered to no insulin to be given if 200mg /dl, DC long-acting insulin.    History of UTI: Urinalysis here shows no pyuria or bacteriuria.  - Stopped ciprofloxacin, especially in light of renal insufficiency  and advanced age.   Acute delirium on chronic, progressive cognitive impairment, NOS: No definite diagnosis of dementia, though was reportedly under consideration for transfer to memory care.  - Delirium precautions - Avoid sedating medications - Avoid hypoglycemia and uremia - Sitter if needed is preferred due to lack of agitation. - Low dose escitalopram in the event that this is related to pseudodementia and/or prolonged grief reaction from wife's death 01-31-2019.  Thrombocytopenia: Mild, appears chronic, stable.  AKI on stage IIIb CKD: Could possibly have progressed to stage IV CKD.  - Continue diuresis. SCr is reasonably stable.  - Continue monitoring, avoid nephrotoxins as above. - Hold ARB  DVT prophylaxis: Heparin 5k q8h Code Status: DNR Family Communication: Called daughter and SIL. Disposition Plan:  Status is: Inpatient  Remains inpatient appropriate because:Hemodynamically unstable, Altered mental status and Inpatient level of care appropriate due to severity of illness   Dispo: The patient is from: ALF              Anticipated d/c is to: SNF recommended by therapy based on current functional status.               Anticipated d/c date is: 2 days              Patient currently is not medically stable to d/c.  Consultants:   Cardiology  Palliative Care  Procedures:   None  Antimicrobials:  Cipro x1 7/22   Subjective: Had significant hypoglycemic episode yesterday PM to 27mg /dl, alertness has improved since then, though he's growing most restless and incessantly trying to get out of the  bed, forgetful. Denies any dyspnea, chest pain, or cough/fever. CBG 121 this AM.   Objective: Vitals:   12/29/19 0631 12/29/19 0701 12/29/19 1249 12/29/19 1604  BP: (!) 146/88  (!) 158/86 (!) 139/77  Pulse: 74  81 57  Resp: 20  20 16   Temp: 99.1 F (37.3 C)   98.4 F (36.9 C)  TempSrc: Oral   Oral  SpO2: 98%  100% 98%  Weight:  65.6 kg    Height:         Intake/Output Summary (Last 24 hours) at 12/29/2019 2313 Last data filed at 12/29/2019 1810 Gross per 24 hour  Intake 880 ml  Output 2400 ml  Net -1520 ml   Filed Weights   12/27/19 1545 12/28/19 0436 12/29/19 0701  Weight: 65.8 kg 64.9 kg 65.6 kg   Gen: 86 y.o. male in no distress Pulm: Nonlabored breathing supplemental oxygen. Crackles at bases improved. CV: Regular borderline bradycardia. No murmur, rub, or gallop. No JVD, 1+ improved dependent edema. GI: Abdomen soft, non-tender, non-distended, with normoactive bowel sounds.  Ext: Warm, no deformities Skin: No new rashes, lesions or ulcers on visualized skin. Neuro: Alert and incompletely oriented. Follows commands, requires reorientation frequently. Psych: Judgement and insight appear impaired. Mood euthymic & affect congruent. Behavior is appropriate.    Data Reviewed: I have personally reviewed following labs and imaging studies  CBC: Recent Labs  Lab 12/27/19 1140 12/29/19 0503  WBC 4.8 12.6*  NEUTROABS 2.0  --   HGB 13.1 12.7*  HCT 40.3 38.4*  MCV 88.8 87.3  PLT 126* 876*   Basic Metabolic Panel: Recent Labs  Lab 12/27/19 1140 12/28/19 0419 12/28/19 0433 12/29/19 0503  NA 137 136  --  140  K 5.1 4.7  --  3.6  CL 102 101  --  102  CO2 22 23  --  25  GLUCOSE 226* 232*  --  104*  BUN 30* 40*  --  40*  CREATININE 1.57* 1.66*  --  1.71*  CALCIUM 8.4* 8.2*  --  7.9*  MG  --   --  2.4  --    GFR: Estimated Creatinine Clearance: 20.7 mL/min (A) (by C-G formula based on SCr of 1.71 mg/dL (H)). Liver Function Tests: Recent Labs  Lab 12/27/19 1140 12/28/19 0419  AST 80* 30  ALT 57* 45*  ALKPHOS 140* 124  BILITOT 0.7 0.5  PROT 6.7 6.5  ALBUMIN 3.8 3.5   No results for input(s): LIPASE, AMYLASE in the last 168 hours. No results for input(s): AMMONIA in the last 168 hours. Coagulation Profile: No results for input(s): INR, PROTIME in the last 168 hours. Cardiac Enzymes: No results for input(s):  CKTOTAL, CKMB, CKMBINDEX, TROPONINI in the last 168 hours. BNP (last 3 results) Recent Labs    12/11/19 1549  PROBNP 4,030*   HbA1C: Recent Labs    12/28/19 0419  HGBA1C 9.3*   CBG: Recent Labs  Lab 12/29/19 0524 12/29/19 0724 12/29/19 1207 12/29/19 1703 12/29/19 2049  GLUCAP 107* 121* 312* 309* 343*   Lipid Profile: No results for input(s): CHOL, HDL, LDLCALC, TRIG, CHOLHDL, LDLDIRECT in the last 72 hours. Thyroid Function Tests: No results for input(s): TSH, T4TOTAL, FREET4, T3FREE, THYROIDAB in the last 72 hours. Anemia Panel: No results for input(s): VITAMINB12, FOLATE, FERRITIN, TIBC, IRON, RETICCTPCT in the last 72 hours. Urine analysis:    Component Value Date/Time   COLORURINE YELLOW 12/27/2019 1412   APPEARANCEUR CLEAR 12/27/2019 1412   LABSPEC 1.011 12/27/2019 1412  PHURINE 5.0 12/27/2019 1412   GLUCOSEU NEGATIVE 12/27/2019 1412   HGBUR NEGATIVE 12/27/2019 1412   HGBUR negative 02/28/2008 0751   BILIRUBINUR NEGATIVE 12/27/2019 1412   KETONESUR NEGATIVE 12/27/2019 1412   PROTEINUR 100 (A) 12/27/2019 1412   UROBILINOGEN 0.2 07/10/2014 0039   NITRITE NEGATIVE 12/27/2019 1412   LEUKOCYTESUR NEGATIVE 12/27/2019 1412   Recent Results (from the past 240 hour(s))  SARS Coronavirus 2 by RT PCR (hospital order, performed in [facility] hospital lab) Nasopharyngeal Nasopharyngeal Swab     Status: None   Collection Time: 12/27/19 12:13 PM   Specimen: Nasopharyngeal Swab  Result Value Ref Range Status   SARS Coronavirus 2 NEGATIVE NEGATIVE Final    Comment: (NOTE) SARS-CoV-2 target nucleic acids are NOT DETECTED.  The SARS-CoV-2 RNA is generally detectable in upper and lower respiratory specimens during the acute phase of infection. The lowest concentration of SARS-CoV-2 viral copies this assay can detect is 250 copies / mL. A negative result does not preclude SARS-CoV-2 infection and should not be used as the sole basis for treatment or other patient  management decisions.  A negative result may occur with improper specimen collection / handling, submission of specimen other than nasopharyngeal swab, presence of viral mutation(s) within the areas targeted by this assay, and inadequate number of viral copies (<250 copies / mL). A negative result must be combined with clinical observations, patient history, and epidemiological information.  Fact Sheet for Patients:   StrictlyIdeas.no  Fact Sheet for Healthcare Providers: BankingDealers.co.za  This test is not yet approved or  cleared by the [place] FDA and has been authorized for detection and/or diagnosis of SARS-CoV-2 by FDA under an Emergency Use Authorization (EUA).  This EUA will remain in effect (meaning this test can be used) for the duration of the COVID-19 declaration under Section 564(b)(1) of the Act, 21 U.S.C. section 360bbb-3(b)(1), unless the authorization is terminated or revoked sooner.  Performed at [facility], [place] [address], [place], [place] [address]   MRSA PCR Screening     Status: None   Collection Time: 12/27/19  6:06 PM   Specimen: Nasal Mucosa; Nasopharyngeal  Result Value Ref Range Status   MRSA by PCR NEGATIVE NEGATIVE Final    Comment:        The GeneXpert MRSA Assay (FDA approved for NASAL specimens only), is one component of a comprehensive MRSA colonization surveillance program. It is not intended to diagnose MRSA infection nor to guide or monitor treatment for MRSA infections. Performed at [facility], [place] [address] [place], [place] [address]       Radiology Studies: DG CHEST PORT 1 VIEW  Result Date: 12/29/2019 CLINICAL DATA:  Acute on chronic systolic and diastolic congestive heart failure. EXAM: PORTABLE CHEST 1 VIEW COMPARISON:  12/27/2019 FINDINGS: Status post median sternotomy and CABG procedure. Mild cardiac enlargement, stable. Interval  improvement in pulmonary edema. No new findings. IMPRESSION: Improving pulmonary edema. Electronically Signed   By: Kerby Moors M.D.   On: 12/29/2019 10:40    Scheduled Meds: . aspirin EC  81 mg Oral Daily  . escitalopram  5 mg Oral Daily  . feeding supplement (ENSURE ENLIVE)  237 mL Oral BID BM  . furosemide  40 mg Intravenous BID  . heparin injection (subcutaneous)  5,000 Units Subcutaneous Q8H  . insulin aspart  0-4 Units Subcutaneous TID WC  . ipratropium-albuterol  3 mL Nebulization TID  . [START ON 12/30/2019] isosorbide mononitrate  60 mg Oral  Daily  . latanoprost  1 drop Both Eyes QHS  . sodium chloride flush  3 mL Intravenous Q12H  . tamsulosin  0.4 mg Oral Daily   Continuous Infusions: . sodium chloride       LOS: 2 days   Time spent: 35 minutes.  Neila Gear, MD [facility] [email or web address] 12/29/2019, 11:13 PM

## 2019-12-29 NOTE — Evaluation (Signed)
Clinical/Bedside Swallow Evaluation Patient Details  Name: Leonard [name] MRN: [id number] Date of Birth: [birth year]  Today's Date: 12/29/2019 Time: SLP Start Time (ACUTE ONLY): 31 SLP Stop Time (ACUTE ONLY): 1645 SLP Time Calculation (min) (ACUTE ONLY): 20 min  Past Medical History:  Past Medical History:  Diagnosis Date  . Bladder tumor    hematuria  . Carotid stenosis, bilateral 08/20/2009   mild  . Chronic systolic CHF (congestive heart failure) ([place])   . CKD (chronic kidney disease), stage III   . Coronary artery disease    A. S/P CABG w/ Redo CABG in 1997;  b. 2005 Cath: LIMA->LAD ok, VG->OM ok, VG->RPDA->RPLV ok. Severe native 3VD.  Marland Kitchen DDD (degenerative disc disease), lumbar    s/p fusion 4-5  . Diabetes mellitus   . Diabetic peripheral neuropathy (HCC)    feet  . Glaucoma bilateral    eye drop rx  . Heart block    a. h/o known conduction disease with first degree AV block, Wenkebach, RBBB, LAFB -> advised to avoid AVN blocking agents.  . History of hiatal hernia    denies reflux  . HOH (hard of hearing) no hearing aids  . Hypertension   . Mass of skin of right shoulder 03/12/2017  . Mild dilation of ascending aorta (HCC)   . Nocturia   . Pancytopenia ([place])    noted on labs  . Pneumonia due to COVID-19 virus 06/2019  . Prostate cancer [facility]) 1996   s/p external radiation  . Renal insufficiency hx  . Syncope 06/06/2011   In context of dehydration and bradycardia.  . Thrombocytopenia ([place]) 03/12/2017   Past Surgical History:  Past Surgical History:  Procedure Laterality Date  . CARDIAC CATHETERIZATION  last cath 2005   per dr Olevia Perches- w/ chart (grafts patent)-  results w/ chart  . CATARACT EXTRACTION W/ INTRAOCULAR LENS  IMPLANT, BILATERAL    . CATARACT EXTRACTION W/PHACO Bilateral    Dr. Gershon Crane  . CORONARY ARTERY BYPASS GRAFT  1984   4 vessels  . CORONARY ARTERY BYPASS GRAFT  1997- redo   3 vessels  . LIPOMA EXCISION  1970's   from back  . LUMBAR FUSION   01/2004   L 4-5  . TRANSURETHRAL RESECTION OF BLADDER TUMOR  04/16/2011   Procedure: TRANSURETHRAL RESECTION OF BLADDER TUMOR (TURBT);  Surgeon: Claybon Jabs;  Location: [place];  Service: Urology;  Laterality: N/A;  . UPPER GASTROINTESTINAL ENDOSCOPY     dx hiatal hernia   HPI:  86 y.o. male with a known history of hypertension, type 2 diabetes, CKD stage IIIa, history of Covid in January, coronary artery disease status post CABG with redo in 1997, last cath in 2005 with patent LIMA and patent SVG, ICM with last measured EF of 40 to 45% on echo dated 11/13/2019.  Patient was admitted in January followed by June 2021. Patient is a resident at [facility] assisted living where he was transferred on 12/13/2019 from [employer].  Pt admitted for acute on chronic CHF.   Assessment / Plan / Recommendation Clinical Impression  Patient presents with a mild oropharyngeal dysphagia with suspected impact from current cognitive state resulting in decreased attention and awareness to bolues and impulsivity with PO intake. Patient also exhbiited belching frequently with sips of liquids and SLP suspecting GERD. Patient with coughing and brief instance of SOB and wheezing with successive straw sips of thin liquids however this did not occur with single controlled straw sips. Patient  did not have any difficulty with puree solids. Voice remained clear throughout this assessment. SLP Visit Diagnosis: Dysphagia, unspecified (R13.10)    Aspiration Risk  Mild aspiration risk;Moderate aspiration risk    Diet Recommendation Dysphagia 1 (Puree);Thin liquid   Liquid Administration via: Cup;Straw Medication Administration: Crushed with puree Supervision: Full supervision/cueing for compensatory strategies Compensations: Minimize environmental distractions;Slow rate;Small sips/bites Postural Changes: Seated upright at 90 degrees    Other  Recommendations Oral Care Recommendations: Oral care  BID;Staff/trained caregiver to provide oral care   Follow up Recommendations Skilled Nursing facility;24 hour supervision/assistance      Frequency and Duration min 2x/week  1 week       Prognosis Prognosis for Safe Diet Advancement: Good      Swallow Study   General Date of Onset: 12/27/19 HPI: 86 y.o. male with a known history of hypertension, type 2 diabetes, CKD stage IIIa, history of Covid in January, coronary artery disease status post CABG with redo in 1997, last cath in 2005 with patent LIMA and patent SVG, ICM with last measured EF of 40 to 45% on echo dated 11/13/2019.  Patient was admitted in January followed by June 2021. Patient is a resident at [facility] assisted living where he was transferred on 12/13/2019 from [employer].  Pt admitted for acute on chronic CHF. Type of Study: Bedside Swallow Evaluation Previous Swallow Assessment: N/A Diet Prior to this Study: Dysphagia 3 (soft);Thin liquids Temperature Spikes Noted: No Respiratory Status: Room air History of Recent Intubation: No Behavior/Cognition: Alert;Cooperative;Pleasant mood;Confused;Requires cueing Oral Cavity Assessment: Dry Oral Care Completed by SLP: Yes Oral Cavity - Dentition: Poor condition;Missing dentition Vision: Functional for self-feeding Self-Feeding Abilities: Needs assist;Needs set up Patient Positioning: Upright in bed Baseline Vocal Quality: Normal Volitional Cough: Strong Volitional Swallow: Unable to elicit    Oral/Motor/Sensory Function Overall Oral Motor/Sensory Function: Within functional limits   Ice Chips     Thin Liquid Thin Liquid: Impaired Presentation: Straw Oral Phase Impairments: Poor awareness of bolus Pharyngeal  Phase Impairments: Suspected delayed Swallow;Cough - Immediate Other Comments: cough after successive straw sips of thin liquids, no s/s aspiration when taking one sip at a time (controlled by SLP)    Nectar Thick     Honey Thick     Puree Puree: Within  functional limits Presentation: Spoon   Solid     Solid: Not tested      Sonia Baller, MA, CCC-SLP Speech Therapy

## 2019-12-29 NOTE — Progress Notes (Signed)
.  Hypoglycemic Event  CBG: 34  Treatment: Dextrose 50% 25g  Symptoms: decreased responsiveness, shaking  Follow-up CBG: Time:2119 CBG Result:128  Possible Reasons for Event: not much po intake  Comments/MD notified: Notified Blount, NP. After D50 pt became agitated and combative while attempting to get out of bed. Received order for 0.5mg  ativan which was effective to calm pt. Refuses to eat or drink anything. Per orders held lantus dose and initiated dextrose normal saline infusion.    [facility] [facility]

## 2019-12-29 NOTE — Progress Notes (Signed)
Pt pulled IV access. Unable to put IV at this time d/t confusion and aggressive behavior. N.P. informed. Will continue to monitor.

## 2019-12-29 NOTE — Progress Notes (Signed)
Pt still very confuse and agitated. Pt is wheezing and SHOB. Still unable to put oxygen despite pt on restraints. Still no IV access. N.P aware. Armed forces technical officer. Will continue to monitor.

## 2019-12-30 LAB — CBC
HCT: 39.5 % (ref 39.0–52.0)
Hemoglobin: 13 g/dL (ref 13.0–17.0)
MCH: 28.4 pg (ref 26.0–34.0)
MCHC: 32.9 g/dL (ref 30.0–36.0)
MCV: 86.4 fL (ref 80.0–100.0)
Platelets: 116 10*3/uL — ABNORMAL LOW (ref 150–400)
RBC: 4.57 MIL/uL (ref 4.22–5.81)
RDW: 15.3 % (ref 11.5–15.5)
WBC: 7.7 10*3/uL (ref 4.0–10.5)
nRBC: 0 % (ref 0.0–0.2)

## 2019-12-30 LAB — GLUCOSE, CAPILLARY
Glucose-Capillary: 401 mg/dL — ABNORMAL HIGH (ref 70–99)
Glucose-Capillary: 168 mg/dL — ABNORMAL HIGH (ref 70–99)
Glucose-Capillary: 252 mg/dL — ABNORMAL HIGH (ref 70–99)

## 2019-12-30 LAB — BASIC METABOLIC PANEL
Anion gap: 14 (ref 5–15)
BUN: 49 mg/dL — ABNORMAL HIGH (ref 8–23)
CO2: 25 mmol/L (ref 22–32)
Calcium: 7.5 mg/dL — ABNORMAL LOW (ref 8.9–10.3)
Chloride: 99 mmol/L (ref 98–111)
Creatinine, Ser: 1.84 mg/dL — ABNORMAL HIGH (ref 0.61–1.24)
GFR calc Af Amer: 35 mL/min — ABNORMAL LOW (ref >60)
GFR calc non Af Amer: 30 mL/min — ABNORMAL LOW (ref >60)
Glucose, Bld: 389 mg/dL — ABNORMAL HIGH (ref 70–99)
Potassium: 3.6 mmol/L (ref 3.5–5.1)
Sodium: 138 mmol/L (ref 135–145)

## 2019-12-30 MED ORDER — LORAZEPAM 2 MG/ML IJ SOLN
1.0000 mg | INTRAMUSCULAR | Status: DC | PRN
  Administered 2019-12-30 – 2019-12-31 (×6): 1 mg via INTRAVENOUS
  Filled 2019-12-30 (×6): qty 1

## 2019-12-30 MED ORDER — HYDROMORPHONE HCL 1 MG/ML IJ SOLN
0.5000 mg | INTRAMUSCULAR | Status: DC | PRN
  Administered 2019-12-30 – 2020-01-01 (×2): 0.5 mg via INTRAVENOUS
  Filled 2019-12-30 (×2): qty 0.5

## 2019-12-30 MED ORDER — DIPHENHYDRAMINE HCL 50 MG/ML IJ SOLN: 25.0000 mg | Freq: Once | INTRAMUSCULAR | Status: DC

## 2019-12-30 MED ORDER — LORAZEPAM 2 MG/ML IJ SOLN
0.5000 mg | Freq: Once | INTRAMUSCULAR | Status: AC
  Administered 2019-12-30: 0.5 mg via INTRAVENOUS
  Filled 2019-12-30: qty 1

## 2019-12-30 MED ORDER — ONDANSETRON HCL 4 MG/2ML IJ SOLN: 4.0000 mg | Freq: Four times a day (QID) | INTRAMUSCULAR | Status: DC | PRN

## 2019-12-30 MED ORDER — DIPHENHYDRAMINE HCL 50 MG/ML IJ SOLN
25.0000 mg | Freq: Once | INTRAMUSCULAR | Status: AC
  Administered 2019-12-30: 25 mg via INTRAMUSCULAR
  Filled 2019-12-30: qty 1

## 2019-12-30 MED ORDER — OLANZAPINE 5 MG PO TBDP
5.0000 mg | ORAL_TABLET | Freq: Every day | ORAL | Status: DC
  Filled 2019-12-30 (×2): qty 1

## 2019-12-30 NOTE — Progress Notes (Signed)
Daily Progress Note   Patient Name: Leonard [name]       Date: 12/30/2019 DOB: 16-Jan-1921  Age: 86 y.o. MRN#: [id number] Attending Physician: Patrecia Pour, MD Primary Care Physician: Hoyt Koch, MD Admit Date: 12/27/2019  Reason for Consultation/Follow-up: Establishing goals of care  Subjective:  patient with ongoing confusion, acute delirium, patient with agitation overnight and most of the day today, periodically.  Seen earlier this am, and then this afternoon, patient's daughter Leonard [name] and son in law at bedside. See below.   Length of Stay: 3  Current Medications: Scheduled Meds:  . aspirin EC  81 mg Oral Daily  . escitalopram  5 mg Oral Daily  . feeding supplement (ENSURE ENLIVE)  237 mL Oral BID BM  . furosemide  40 mg Intravenous BID  . heparin injection (subcutaneous)  5,000 Units Subcutaneous Q8H  . insulin aspart  0-4 Units Subcutaneous TID WC  . ipratropium-albuterol  3 mL Nebulization TID  . isosorbide mononitrate  60 mg Oral Daily  . latanoprost  1 drop Both Eyes QHS  . OLANZapine zydis  5 mg Oral QHS  . sodium chloride flush  3 mL Intravenous Q12H  . tamsulosin  0.4 mg Oral Daily    Continuous Infusions: . sodium chloride      PRN Meds: sodium chloride, acetaminophen, albuterol, HYDROmorphone (DILAUDID) injection **OR** ondansetron (ZOFRAN) IV, LORazepam, sodium chloride flush  Physical Exam         Confused, agitated at times Restless Coarse wheezing at times No edema At times with labored work of breathing.  Abdomen not distended  Vital Signs: BP (!) 183/78 (BP Location: Left Arm)   Pulse 88   Temp 99 F (37.2 C)   Resp 20   Ht 5\' 4"  (1.626 m)   Wt 60.5 kg   SpO2 100%   BMI 22.89 kg/m  SpO2: SpO2: 100 % O2 Device: O2 Device: Nasal  Cannula O2 Flow Rate: O2 Flow Rate (L/min): 2 L/min  Intake/output summary:   Intake/Output Summary (Last 24 hours) at 12/30/2019 1428 Last data filed at 12/30/2019 1015 Gross per 24 hour  Intake 590 ml  Output 1600 ml  Net -1010 ml   LBM: Last BM Date: 12/30/19 (per RN report ) Baseline Weight: Weight: 65.8 kg Most recent weight: Weight: 60.5 kg  Palliative Assessment/Data:      Patient Active Problem List   Diagnosis Date Noted  . Acute on chronic systolic CHF (congestive heart failure) ([place]) 12/27/2019  . Malnutrition of moderate degree 11/15/2019  . Complete heart block ([place]) 11/13/2019  . Acute encephalopathy 11/13/2019  . Acute CHF (congestive heart failure) ([place]) 11/12/2019  . CHF (congestive heart failure) ([place]) 11/12/2019  . Moderate nonproliferative diabetic retinopathy of both eyes ([place]) 09/26/2019  . Pseudophakia 09/26/2019  . Posterior vitreous detachment of both eyes 09/26/2019  . 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 COVID-19 virus detected 06/26/2019  . Thrombocytopenia ([place]) 03/12/2017  . Mass of skin of right shoulder 03/12/2017  . Normal pressure primary open angle glaucoma (POAG) 03/11/2017  . Hiatal hernia 03/11/2017  . Chest pain 03/11/2017  . Left leg weakness 06/29/2016  . Degenerative arthritis of left knee 05/25/2016  . Left knee pain 03/24/2016  . History of prostate cancer 06/12/2014  . Chronic renal insufficiency, stage III (moderate) 06/12/2014  . Non-compliant behavior 10/18/2013  . Abnormal computed tomography of pancreas or bile duct 09/21/2011  . Low back pain with sciatica 07/07/2010  . Uncontrolled type 2 diabetes with renal manifestation ([place]) 03/06/2008  . Hyperlipidemia 03/06/2008  . Hx of completed stroke 03/06/2008  . Diabetes ([place]) 07/26/2007  . Essential hypertension 07/26/2007  . CAROTID BRUIT 07/26/2007  . Coronary atherosclerosis 07/04/2006  . OSTEOPOROSIS 07/04/2006     Palliative Care Assessment & Plan   Patient Profile:  Leonard [name] a 86 y.o.malewith a hx of CAD s/pCABG [id number], CKD stage III (baseline [place] 1.3-1.6),carotidartery disease, DMwith peripheral neuropathy, HTN, HLD, glaucoma, prostate CA s/p XRT,prior snycope felt due todehydration/bradycardia,thrombocytopenia, recent underlying conduction disease, chronic systolic CHF,COVID PNA 19/6222, mild dilation of ascending aorta admitted for evaluation of CHF.  Assessment: Acute delirium acute hypoxic respiratory failure due to acute on chronic systolic CHF. Also with labile HTN, brittle Dm,IV CKD. Ongoing bradycardia history of conduction disease. PPS 10%  Recommendations/Plan: Family meeting: Discussed at bedside along with Dr Bonner Puna [facility] MD, patient's daughter and son in law.   Palliative medicine is specialized medical care for people living with serious illness. It focuses on providing relief from the symptoms and stress of a serious illness. The goal is to improve quality of life for both the patient and the family.  Goals of care: Broad aims of medical therapy in relation to the patient's values and preferences. Our aim is to provide medical care aimed at enabling patients to achieve the goals that matter most to them, given the circumstances of their particular medical situation and their constraints.   We discussed about the patient's current condition, his underlying conditions, his baseline cognitive and functional status. Goals, wishes and values attempted to be explored.   Overall, a mode of care that focuses exclusively on comfort has been most prudent. We will initiate comfort measures, allow for unrestricted visitor access, initiate judicious dosages of opioids and benzodiazepines for comfort, chaplain consult, likely for residential hospice in the next 24 hours or so. All questions answered to the best of my ability.   Goals of Care and Additional  Recommendations:  Limitations on Scope of Treatment: Full Comfort Care  Code Status:    Code Status Orders  (From admission, onward)         Start     Ordered   12/27/19 1406  Do not attempt resuscitation (DNR)  Continuous       Question Answer Comment  In the event of cardiac or respiratory ARREST Do not call a "code blue"   In the event of cardiac or respiratory ARREST Do not perform Intubation, CPR, defibrillation or ACLS   In the event of cardiac or respiratory ARREST Use medication by any route, position, wound care, and other measures to relive pain and suffering. May use oxygen, suction and manual treatment of airway obstruction as needed for comfort.      12/27/19 1405        Code Status History    Date Active Date Inactive Code Status Order ID Comments User Context   12/27/2019 1402 12/27/2019 1405 Full Code [id number]  Max Sane, MD ED   11/13/2019 0033 11/20/2019 1829 DNR [id number]  Rise Patience, MD ED   11/12/2019 2305 11/13/2019 0032 DNR [id number]  Rise Patience, MD ED   06/26/2019 1209 06/30/2019 1832 DNR [id number]  Karmen Bongo, MD ED   03/11/2017 1449 03/13/2017 1614 Full Code [id number]  Elease Hashimoto ED   Advance Care Planning Activity    Advance Directive Documentation     Most Recent Value  Type of Advance Directive Healthcare Power of Attorney, Living will  Pre-existing out of facility DNR order (yellow form or pink MOST form) --  "MOST" Form in Place? --       Prognosis:   < 2 weeks  Discharge Planning:  To Be Determined  Care plan was discussed with  Patient's [facility] MD, son in law Clance Boll who is an MD, daughter Leonard [name], Actuary.   Thank you for allowing the Palliative Medicine Team to assist in the care of this patient.   Time In: 1400 Time Out: 1435 Total Time 35 Prolonged Time Billed No       Greater than 50%  of this time was spent counseling and coordinating care related to the above assessment and plan.  Loistine Chance,  MD  Please contact Palliative Medicine Team phone at [phone] for questions and concerns.

## 2019-12-30 NOTE — Progress Notes (Signed)
No V/S Since last night d/t agitation and pulling of lines. Attempted several times but pt keep refusing and being aggressive to staff. Will continue to monitor.

## 2019-12-30 NOTE — Progress Notes (Signed)
Iv team put new IV in. Pt very agitated and in distress. Gave ativan. Will re-assess. Sitter at bedside

## 2019-12-30 NOTE — Progress Notes (Signed)
Pt still very agitated and unable to console. Pt still wheezing and with SOB. Pt sats remain 86-88%. Still unable to put oxygen on and even with breathing treatment. N.P. aware. AC aware. Charge RN aware. No New orders. Will continue to monitor.

## 2019-12-30 NOTE — Progress Notes (Signed)
RN D/C soft mitten wrist restraints r/t redness in the area. Patient with a sitter right now. Still confuse and agitated. Pt still refused oxygen and breathing treatment. N.P aware. No new orders. Will Continue to monitor.

## 2019-12-30 NOTE — Progress Notes (Signed)
Pt was not available for visit as his sitter was working with him. He was resting and then started moving around the bed. Sitter described him as agitated.  Please page if additional assistance.  [place], [facility]   12/30/19 2100  Clinical Encounter Type  Visited With Health care provider

## 2019-12-30 NOTE — Progress Notes (Signed)
PROGRESS NOTE  Leonard [name]  AOZ:[id number] DOB: May 29, 1921 DOA: 12/27/2019 PCP: Hoyt Koch, MD  Outpatient Specialists: Cardiology, Dr. Johnsie Cancel. Brief Narrative: Leonard [name] is a 86 y.o. male with a history of T2DM, HTN, stage IIIa CKD, covid-19, CAD s/p CABG 1984 and redo 1997, ICM w/EF 40-45%, and bradycardia/conduction disease who presented from ALF (recently transferred from SNF) with shortness of breath, weight gain and found to be hypoxemic. He had lower extremity edema, CXR showed pulmonary edema, and BNP 1367. He was given IV lasix, and admitted for decompensated CHF with cardiology consulted.  Assessment & Plan: Active Problems:   Acute on chronic systolic CHF (congestive heart failure) (HCC)  Acute on chronic HFrEF: LVEF 40-45%. Has diuresed to euvolemia.  - Will DC IV lasix and not plan to restart diuretic since po intake has been so negligible. - Stop BB as he's had bradycardia in the past and currently.  - Holding RAAS agents with renal impairment and need for diuresis.  - Increased imdur for combined preload/afterload reduction.   HTN, history of sinus bradycardia:  - Increased imdur as above, not given due to agitation. Avoid negative chronotropes.   Wheezing: Upper airway.  - Tx anxiety - Breathing Tx prn. - Dysphagia diet per SLP if he desires.  Leukocytosis: Resolved without antimicrobial Tx.    T2DM: HbA1c 9.3%. Has had consistent issues with hypoglycemia, permissive hyperglycemia.  - Custom sliding scale ordered to no insulin to be given if 200mg /dl, DC long-acting insulin.    History of UTI: Urinalysis here shows no pyuria or bacteriuria.  - Stopped ciprofloxacin, especially in light of renal insufficiency and advanced age.   Acute delirium on chronic, progressive cognitive impairment, NOS: No definite diagnosis of dementia, though was reportedly under consideration for transfer to memory care.  - Delirium precautions - Tx as per palliative  care, appreciate their assistance. Focus now is exclusively on alleviating distress.  - Sitter if needed is preferred to restraints. Unrestricted visitor status.  Thrombocytopenia: Mild, appears chronic, stable.  AKI on stage IV CKD (based on available lab data)  - Dilaudid preferred to morphine  DVT prophylaxis: Comfort measures Code Status: DNR Family Communication: Daughter and Son in Sports coach at bedside Disposition Plan:  Status is: Inpatient  Remains inpatient appropriate because:Hemodynamically unstable, Altered mental status and Inpatient level of care appropriate due to severity of illness   Dispo: The patient is from: ALF              Anticipated d/c is to: Based on clinical trajectory, anticipate DC to residential hospice which will be pursued. If patient rallies, can DC to ALF under hospice care.              Anticipated d/c date is: 1 day              Patient currently is not medically stable to d/c.  Consultants:   Cardiology  Palliative Care  Procedures:   None  Antimicrobials:  Cipro x1 7/22   Subjective: Severe persistent agitation last night throughout the night despite haldol, redirection, sitters. Restraints worsened agitation. Not opening eyes but moving all limbs. When resting appears calm, when spoken to, begins wheezing loudly and fidgeting ceaselessly.   Objective: Vitals:   12/30/19 0500 12/30/19 0606 12/30/19 0818 12/30/19 0914  BP:  (!) 171/76  (!) 183/78  Pulse:    88  Resp:  20  20  Temp:  98.8 F (37.1 C)  99 F (37.2 C)  TempSrc:  Axillary    SpO2:  96% 94% 100%  Weight: 60.5 kg     Height:        Intake/Output Summary (Last 24 hours) at 12/30/2019 1453 Last data filed at 12/30/2019 1015 Gross per 24 hour  Intake 590 ml  Output 800 ml  Net -210 ml   Filed Weights   12/28/19 0436 12/29/19 0701 12/30/19 0500  Weight: 64.9 kg 65.6 kg 60.5 kg   Gen: Uncomfortable elderly male in no distress Pulm: Nonlabored, upper airway wheezing,  no crackles.  CV: Regular rate and rhythm. No murmur, rub, or gallop. No JVD, no dependent edema. GI: Abdomen soft, non-tender, non-distended, with normoactive bowel sounds.  Ext: Warm, no deformities Skin: No new rashes, lesions or ulcers on visualized skin. Neuro: Doesn't open eyes or follow commands, not oriented.  Psych: Judgement and insight appear impaired.    Data Reviewed: I have personally reviewed following labs and imaging studies  CBC: Recent Labs  Lab 12/27/19 1140 12/29/19 0503 12/30/19 0934  WBC 4.8 12.6* 7.7  NEUTROABS 2.0  --   --   HGB 13.1 12.7* 13.0  HCT 40.3 38.4* 39.5  MCV 88.8 87.3 86.4  PLT 126* 128* 938*   Basic Metabolic Panel: Recent Labs  Lab 12/27/19 1140 12/28/19 0419 12/28/19 0433 12/29/19 0503 12/30/19 0506  NA 137 136  --  140 138  K 5.1 4.7  --  3.6 3.6  CL 102 101  --  102 99  CO2 22 23  --  25 25  GLUCOSE 226* 232*  --  104* 389*  BUN 30* 40*  --  40* 49*  CREATININE 1.57* 1.66*  --  1.71* 1.84*  CALCIUM 8.4* 8.2*  --  7.9* 7.5*  MG  --   --  2.4  --   --    GFR: Estimated Creatinine Clearance: 19.2 mL/min (A) (by C-G formula based on SCr of 1.84 mg/dL (H)). Liver Function Tests: Recent Labs  Lab 12/27/19 1140 12/28/19 0419  AST 80* 30  ALT 57* 45*  ALKPHOS 140* 124  BILITOT 0.7 0.5  PROT 6.7 6.5  ALBUMIN 3.8 3.5   No results for input(s): LIPASE, AMYLASE in the last 168 hours. No results for input(s): AMMONIA in the last 168 hours. Coagulation Profile: No results for input(s): INR, PROTIME in the last 168 hours. Cardiac Enzymes: No results for input(s): CKTOTAL, CKMB, CKMBINDEX, TROPONINI in the last 168 hours. BNP (last 3 results) Recent Labs    12/11/19 1549  PROBNP 4,030*   HbA1C: Recent Labs    12/28/19 0419  HGBA1C 9.3*   CBG: Recent Labs  Lab 12/29/19 1207 12/29/19 1703 12/29/19 2049 12/30/19 0754 12/30/19 1136  GLUCAP 312* 309* 343* 401* 168*   Lipid Profile: No results for input(s): CHOL,  HDL, LDLCALC, TRIG, CHOLHDL, LDLDIRECT in the last 72 hours. Thyroid Function Tests: No results for input(s): TSH, T4TOTAL, FREET4, T3FREE, THYROIDAB in the last 72 hours. Anemia Panel: No results for input(s): VITAMINB12, FOLATE, FERRITIN, TIBC, IRON, RETICCTPCT in the last 72 hours. Urine analysis:    Component Value Date/Time   COLORURINE YELLOW 12/27/2019 1412   APPEARANCEUR CLEAR 12/27/2019 1412   LABSPEC 1.011 12/27/2019 1412   PHURINE 5.0 12/27/2019 1412   GLUCOSEU NEGATIVE 12/27/2019 1412   HGBUR NEGATIVE 12/27/2019 1412   HGBUR negative 02/28/2008 0751   BILIRUBINUR NEGATIVE 12/27/2019 1412   KETONESUR NEGATIVE 12/27/2019 1412   PROTEINUR 100 (A) 12/27/2019 1412   UROBILINOGEN 0.2 07/10/2014  2440   NITRITE NEGATIVE 12/27/2019 Jay 12/27/2019 1412   Recent Results (from the past 240 hour(s))  SARS Coronavirus 2 by RT PCR (hospital order, performed in [facility] hospital lab) Nasopharyngeal Nasopharyngeal Swab     Status: None   Collection Time: 12/27/19 12:13 PM   Specimen: Nasopharyngeal Swab  Result Value Ref Range Status   SARS Coronavirus 2 NEGATIVE NEGATIVE Final    Comment: (NOTE) SARS-CoV-2 target nucleic acids are NOT DETECTED.  The SARS-CoV-2 RNA is generally detectable in upper and lower respiratory specimens during the acute phase of infection. The lowest concentration of SARS-CoV-2 viral copies this assay can detect is 250 copies / mL. A negative result does not preclude SARS-CoV-2 infection and should not be used as the sole basis for treatment or other patient management decisions.  A negative result may occur with improper specimen collection / handling, submission of specimen other than nasopharyngeal swab, presence of viral mutation(s) within the areas targeted by this assay, and inadequate number of viral copies (<250 copies / mL). A negative result must be combined with clinical observations, patient history, and  epidemiological information.  Fact Sheet for Patients:   StrictlyIdeas.no  Fact Sheet for Healthcare Providers: BankingDealers.co.za  This test is not yet approved or  cleared by the [place] FDA and has been authorized for detection and/or diagnosis of SARS-CoV-2 by FDA under an Emergency Use Authorization (EUA).  This EUA will remain in effect (meaning this test can be used) for the duration of the COVID-19 declaration under Section 564(b)(1) of the Act, 21 U.S.C. section 360bbb-3(b)(1), unless the authorization is terminated or revoked sooner.  Performed at [facility], [place] [address] [place], [place] [address]   MRSA PCR Screening     Status: None   Collection Time: 12/27/19  6:06 PM   Specimen: Nasal Mucosa; Nasopharyngeal  Result Value Ref Range Status   MRSA by PCR NEGATIVE NEGATIVE Final    Comment:        The GeneXpert MRSA Assay (FDA approved for NASAL specimens only), is one component of a comprehensive MRSA colonization surveillance program. It is not intended to diagnose MRSA infection nor to guide or monitor treatment for MRSA infections. Performed at [facility], [place] [address] [place], [place] [address]       Radiology Studies: DG CHEST PORT 1 VIEW  Result Date: 12/29/2019 CLINICAL DATA:  Acute on chronic systolic and diastolic congestive heart failure. EXAM: PORTABLE CHEST 1 VIEW COMPARISON:  12/27/2019 FINDINGS: Status post median sternotomy and CABG procedure. Mild cardiac enlargement, stable. Interval improvement in pulmonary edema. No new findings. IMPRESSION: Improving pulmonary edema. Electronically Signed   By: Kerby Moors M.D.   On: 12/29/2019 10:40    Scheduled Meds: . aspirin EC  81 mg Oral Daily  . escitalopram  5 mg Oral Daily  . feeding supplement (ENSURE ENLIVE)  237 mL Oral BID BM  . furosemide  40 mg Intravenous BID  . heparin injection  (subcutaneous)  5,000 Units Subcutaneous Q8H  . insulin aspart  0-4 Units Subcutaneous TID WC  . ipratropium-albuterol  3 mL Nebulization TID  . isosorbide mononitrate  60 mg Oral Daily  . latanoprost  1 drop Both Eyes QHS  . OLANZapine zydis  5 mg Oral QHS  . sodium chloride flush  3 mL Intravenous Q12H  . tamsulosin  0.4 mg Oral Daily   Continuous Infusions: . sodium chloride  LOS: 3 days   Time spent: 35 minutes.  Patrecia Pour, MD [facility] [email or web address] 12/30/2019, 2:53 PM

## 2019-12-30 NOTE — Progress Notes (Signed)
This note also relates to the following rows which could not be included: ECG Heart Rate - Cannot attach notes to unvalidated device data Resp - Cannot attach notes to unvalidated device data    12/30/19 0800  PAINAD (Pain Assessment in Advanced Dementia)  Breathing 2  Negative Vocalization 2  Facial Expression 2  Body Language 1  Consolability 1  PAINAD Score 8  Provider Notification  Provider Name/Title Grunz MD  Date Provider Notified 12/30/19  Time Provider Notified [phone]  Notification Type Page  Notification Reason Other (Comment) (CBG of 401)  Response Other (Comment) (give current SSI coverage )  Date of Provider Response 12/30/19  Time of Provider Response 0800

## 2019-12-30 NOTE — Progress Notes (Signed)
MD paged requesting prn for agitation, patient has been fighting staff all night and has audible wheezing and SOB as a result, restraints removed per previous RN DT: high risk for injury and skin breakdown

## 2019-12-30 NOTE — Progress Notes (Signed)
RN able to stick patient with 8 Staff holding patient down. IV benadryl given. Pt resting right now. Pt on o2 sat 92-94% on 2L o2. Will continue to monitor.

## 2019-12-31 LAB — GLUCOSE, CAPILLARY
Glucose-Capillary: 218 mg/dL — ABNORMAL HIGH (ref 70–99)
Glucose-Capillary: 236 mg/dL — ABNORMAL HIGH (ref 70–99)
Glucose-Capillary: 256 mg/dL — ABNORMAL HIGH (ref 70–99)

## 2019-12-31 MED ORDER — INSULIN ASPART 100 UNIT/ML ~~LOC~~ SOLN: 0.0000 [IU] | Freq: Three times a day (TID) | SUBCUTANEOUS | Status: DC

## 2019-12-31 MED ORDER — HYDROMORPHONE HCL 1 MG/ML IJ SOLN
0.2500 mg | INTRAMUSCULAR | Status: DC
  Administered 2019-12-31 – 2020-01-01 (×4): 0.25 mg via INTRAVENOUS
  Filled 2019-12-31 (×4): qty 0.5

## 2019-12-31 MED ORDER — LORAZEPAM 2 MG/ML IJ SOLN
1.0000 mg | INTRAMUSCULAR | Status: DC
  Administered 2019-12-31 – 2020-01-01 (×4): 1 mg via INTRAVENOUS
  Filled 2019-12-31 (×4): qty 1

## 2019-12-31 NOTE — TOC Progression Note (Signed)
Transition of Care [facility]) - Progression Note    Patient Details  Name: Leonard [name] MRN: [id number] Date of Birth: [birth year]  [facility]) CM/SW Contact  Ross Ludwig, [place] Phone Number: 12/31/2019, 11:16 AM  Clinical Narrative:     CSW spoke to patient's daughter Leonard [name] via phone [phone] and provided choice of hospice facilities.  Daughter would like [facility].  CSW spoke to [facility], they do not have a bed available today, but will review patient's information.  CSW to continue to follow patient's progress throughout discharge planning.         Expected Discharge Plan and [place] facility.                                               Social Determinants of Health (SDOH) Interventions    Readmission Risk Interventions No flowsheet data found.

## 2019-12-31 NOTE — Progress Notes (Signed)
Daily Progress Note   Patient Name: Leonard [name]       Date: 12/31/2019 DOB: 12-17-1920  Age: 86 y.o. MRN#: [id number] Attending Physician: Patrecia Pour, MD Primary Care Physician: Hoyt Koch, MD Admit Date: 12/27/2019  Reason for Consultation/Follow-up: Establishing goals of care  Subjective:  now on comfort measures, overnight events noted, patient awake, appears in no distress, still with noisy breathing, not dyspneic appearing.  Call placed discussed with daughter Neoma Laming, see below.      Length of Stay: 4  Current Medications: Scheduled Meds:  . aspirin EC  81 mg Oral Daily  . feeding supplement (ENSURE ENLIVE)  237 mL Oral BID BM  . insulin aspart  0-4 Units Subcutaneous TID WC  . isosorbide mononitrate  60 mg Oral Daily  . latanoprost  1 drop Both Eyes QHS  . OLANZapine zydis  5 mg Oral QHS  . sodium chloride flush  3 mL Intravenous Q12H  . tamsulosin  0.4 mg Oral Daily    Continuous Infusions: . sodium chloride      PRN Meds: sodium chloride, acetaminophen, albuterol, HYDROmorphone (DILAUDID) injection **OR** ondansetron (ZOFRAN) IV, LORazepam, sodium chloride flush  Physical Exam         Awakens some, no distress Less Restless Coarse wheezing at times No edema Coarse breath sounds  Abdomen not distended  Vital Signs: BP (!) 134/97 (BP Location: Right Arm)   Pulse 59   Temp 97.7 F (36.5 C) (Oral)   Resp 16   Ht 5\' 4"  (1.626 m)   Wt 60.5 kg   SpO2 95%   BMI 22.89 kg/m  SpO2: SpO2: 95 % O2 Device: O2 Device: Nasal Cannula O2 Flow Rate: O2 Flow Rate (L/min): 2 L/min  Intake/output summary:   Intake/Output Summary (Last 24 hours) at 12/31/2019 1056 Last data filed at 12/31/2019 0600 Gross per 24 hour  Intake 0 ml  Output 450 ml  Net -450  ml   LBM: Last BM Date: 12/30/19 Baseline Weight: Weight: 65.8 kg Most recent weight: Weight: 60.5 kg       Palliative Assessment/Data:      Patient Active Problem List   Diagnosis Date Noted  . Acute on chronic systolic CHF (congestive heart failure) ([place]) 12/27/2019  . Malnutrition of moderate degree 11/15/2019  . Complete heart block (  [place]) 11/13/2019  . Acute encephalopathy 11/13/2019  . Acute CHF (congestive heart failure) ([place]) 11/12/2019  . CHF (congestive heart failure) ([place]) 11/12/2019  . Moderate nonproliferative diabetic retinopathy of both eyes ([place]) 09/26/2019  . Pseudophakia 09/26/2019  . Posterior vitreous detachment of both eyes 09/26/2019  . 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 COVID-19 virus detected 06/26/2019  . Thrombocytopenia ([place]) 03/12/2017  . Mass of skin of right shoulder 03/12/2017  . Normal pressure primary open angle glaucoma (POAG) 03/11/2017  . Hiatal hernia 03/11/2017  . Chest pain 03/11/2017  . Left leg weakness 06/29/2016  . Degenerative arthritis of left knee 05/25/2016  . Left knee pain 03/24/2016  . History of prostate cancer 06/12/2014  . Chronic renal insufficiency, stage III (moderate) 06/12/2014  . Non-compliant behavior 10/18/2013  . Abnormal computed tomography of pancreas or bile duct 09/21/2011  . Low back pain with sciatica 07/07/2010  . Uncontrolled type 2 diabetes with renal manifestation ([place]) 03/06/2008  . Hyperlipidemia 03/06/2008  . Hx of completed stroke 03/06/2008  . Diabetes ([place]) 07/26/2007  . Essential hypertension 07/26/2007  . CAROTID BRUIT 07/26/2007  . Coronary atherosclerosis 07/04/2006  . OSTEOPOROSIS 07/04/2006    Palliative Care Assessment & Plan   Patient Profile:  Leonard [name] a 86 y.o.malewith a hx of CAD s/pCABG 0630&1601, CKD stage III (baseline [place] 1.3-1.6),carotidartery disease, DMwith peripheral neuropathy, HTN, HLD, glaucoma, prostate  CA s/p XRT,prior snycope felt due todehydration/bradycardia,thrombocytopenia, recent underlying conduction disease, chronic systolic CHF,COVID PNA 02/3234, mild dilation of ascending aorta admitted for evaluation of CHF.  Assessment: Acute delirium acute hypoxic respiratory failure due to acute on chronic systolic CHF. Also with labile HTN, brittle Dm,IV CKD. Ongoing bradycardia history of conduction disease. PPS 10% PPS 10% Recommendations/Plan: Comfort measures Ok to proceed with residential hospice, choices discussed, patient's family is familiar with Tourist information centre manager.  Prognosis few days, in my opinion.    Goals of Care and Additional Recommendations:  Limitations on Scope of Treatment: Full Comfort Care  Code Status:    Code Status Orders  (From admission, onward)         Start     Ordered   12/27/19 1406  Do not attempt resuscitation (DNR)  Continuous       Question Answer Comment  In the event of cardiac or respiratory ARREST Do not call a "code blue"   In the event of cardiac or respiratory ARREST Do not perform Intubation, CPR, defibrillation or ACLS   In the event of cardiac or respiratory ARREST Use medication by any route, position, wound care, and other measures to relive pain and suffering. May use oxygen, suction and manual treatment of airway obstruction as needed for comfort.      12/27/19 1405        Code Status History    Date Active Date Inactive Code Status Order ID Comments User Context   12/27/2019 1402 12/27/2019 1405 Full Code [id number]  Max Sane, MD ED   11/13/2019 0033 11/20/2019 1829 DNR [id number]  Rise Patience, MD ED   11/12/2019 2305 11/13/2019 0032 DNR [id number]  Rise Patience, MD ED   06/26/2019 1209 06/30/2019 1832 DNR [id number]  Karmen Bongo, MD ED   03/11/2017 1449 03/13/2017 1614 Full Code [id number]  Elease Hashimoto ED   Advance Care Planning Activity    Advance Directive Documentation     Most Recent Value    Type of Advance Directive Healthcare Power of [place], Living will  Pre-existing out of facility DNR order (yellow form or pink MOST form) --  "MOST" Form in Place? --       Prognosis:   < 2 weeks  Discharge Planning:  Residential hospice.   Care plan was discussed with    son in law Clance Boll who is an MD, daughter Neoma Laming on the phone this am.   Thank you for allowing the Palliative Medicine Team to assist in the care of this patient.   Time In: 10 Time Out: 10.25 Total Time 25 Prolonged Time Billed No       Greater than 50%  of this time was spent counseling and coordinating care related to the above assessment and plan.  Loistine Chance, MD  Please contact Palliative Medicine Team phone at [phone] for questions and concerns.

## 2019-12-31 NOTE — Progress Notes (Signed)
Manufacturing engineer [facility]) Hospital Liaison note.    Received request from [place] for family interest in [facility]. Spoke with daughter Neoma Laming to confirm interest.  Unfortunately  [facility] is unable to offer a room today. Hospital Liaison will follow up tomorrow or sooner if a room becomes available.    Please do not hesitate to call with questions.    Thank you for the opportunity to participate in this patients care.  Chrislyn Edison Pace, BSN, RN [facility] (listed on [place] under Hospice/[facility])    [phone]

## 2019-12-31 NOTE — Progress Notes (Signed)
PROGRESS NOTE  Leonard [name]  HBZ:[id number] DOB: July 14, 1920 DOA: 12/27/2019 PCP: Hoyt Koch, MD  Outpatient Specialists: Cardiology, Dr. Johnsie Cancel. Brief Narrative: Leonard [name] is a 86 y.o. male with a history of T2DM, HTN, stage IIIa CKD, covid-19, CAD s/p CABG 1984 and redo 1997, ICM w/EF 40-45%, and bradycardia/conduction disease who presented from ALF (recently transferred from SNF) with shortness of breath, weight gain and found to be hypoxemic. He had lower extremity edema, CXR showed pulmonary edema, and BNP 1367. He was given IV lasix, and admitted for decompensated CHF with cardiology consulted. Despite improvement in volume status, the patient's delirium grew severe and he continued to grow more malnourished, taking nothing by mouth. Palliative care was consulted and after discussions with the patient's family, we have converted to comfort measures only, pursuing residential hospice placement.  Assessment & Plan: Active Problems:   Acute on chronic systolic CHF (congestive heart failure) (HCC)  Acute on chronic HFrEF: LVEF 40-45%. Has diuresed to euvolemia.  - Will not restart diuretic since po intake has been so negligible. - Stopped BB as he's had bradycardia in the past and currently.  - Holding RAAS agents with renal impairment and need for diuresis.  - Increased imdur for combined preload/afterload reduction. This can be avoided due to goals of care in the absence of angina.  HTN, history of sinus bradycardia:  - Increased imdur as above, not given due to mental status changes. Avoid negative chronotropes.   Wheezing: Upper airway.  - Tx anxiety - Breathing Tx prn. - Dysphagia diet per SLP if he desires.  Leukocytosis: Resolved without antimicrobial Tx.    T2DM: HbA1c 9.3%. Has had consistent issues with hypoglycemia, permissive hyperglycemia.  - Custom sliding scale ordered to no insulin to be given if 200mg /dl, DC long-acting insulin.    History of UTI:  Urinalysis here shows no pyuria or bacteriuria.  - Stopped ciprofloxacin, especially in light of renal insufficiency and advanced age.   Acute delirium on chronic, progressive cognitive impairment, NOS: No definite diagnosis of dementia, though was reportedly under consideration for transfer to memory care.  - Delirium precautions - Tx as per palliative care, appreciate their assistance. Focus now is exclusively on alleviating distress.  - Sitter if needed is preferred to restraints. Unrestricted visitor status.  Thrombocytopenia: Mild, appears chronic, stable.  AKI on stage IV CKD (based on available lab data)  - Dilaudid preferred to morphine  DVT prophylaxis: Comfort measures Code Status: DNR Family Communication: Daughter and Son in Sports coach at bedside yesterday. Will reach out again today. Disposition Plan:  Status is: Inpatient  Remains inpatient appropriate because:Hemodynamically unstable, Altered mental status and Inpatient level of care appropriate due to severity of illness   Dispo: The patient is from: ALF              Anticipated d/c is to: Residential hospice once bed is available.              Anticipated d/c date is: 1 day              Patient currently is medically stable to for discharge to hospice  Consultants:   Cardiology  Palliative Care  Procedures:   None  Antimicrobials:  Cipro x1 7/22   Subjective: Much less agitation noted overnight, pt calm this morning.  Objective: Vitals:   12/30/19 0606 12/30/19 0818 12/30/19 0914 12/30/19 2314  BP: (!) 171/76  (!) 183/78 (!) 134/97  Pulse:   88 59  Resp:  20  20 16   Temp: 98.8 F (37.1 C)  99 F (37.2 C) 97.7 F (36.5 C)  TempSrc: Axillary   Oral  SpO2: 96% 94% 100% 95%  Weight:      Height:        Intake/Output Summary (Last 24 hours) at 12/31/2019 1323 Last data filed at 12/31/2019 0600 Gross per 24 hour  Intake 0 ml  Output 450 ml  Net -450 ml   Filed Weights   12/28/19 0436 12/29/19 0701  12/30/19 0500  Weight: 64.9 kg 65.6 kg 60.5 kg   Gen: Elderly male in no distress Pulm: Nonlabored   CV: No JVD or edema GI: Abdomen soft, non-distended, with normoactive bowel sounds.  Ext: Warm, dry Skin: No rashes, lesions or ulcers on visualized skin. Neuro: Calm, nonverbal Psych: Calm  Data Reviewed: I have personally reviewed following labs and imaging studies  CBC: Recent Labs  Lab 12/27/19 1140 12/29/19 0503 12/30/19 0934  WBC 4.8 12.6* 7.7  NEUTROABS 2.0  --   --   HGB 13.1 12.7* 13.0  HCT 40.3 38.4* 39.5  MCV 88.8 87.3 86.4  PLT 126* 128* 629*   Basic Metabolic Panel: Recent Labs  Lab 12/27/19 1140 12/28/19 0419 12/28/19 0433 12/29/19 0503 12/30/19 0506  NA 137 136  --  140 138  K 5.1 4.7  --  3.6 3.6  CL 102 101  --  102 99  CO2 22 23  --  25 25  GLUCOSE 226* 232*  --  104* 389*  BUN 30* 40*  --  40* 49*  CREATININE 1.57* 1.66*  --  1.71* 1.84*  CALCIUM 8.4* 8.2*  --  7.9* 7.5*  MG  --   --  2.4  --   --    GFR: Estimated Creatinine Clearance: 19.2 mL/min (A) (by C-G formula based on SCr of 1.84 mg/dL (H)). Liver Function Tests: Recent Labs  Lab 12/27/19 1140 12/28/19 0419  AST 80* 30  ALT 57* 45*  ALKPHOS 140* 124  BILITOT 0.7 0.5  PROT 6.7 6.5  ALBUMIN 3.8 3.5   No results for input(s): LIPASE, AMYLASE in the last 168 hours. No results for input(s): AMMONIA in the last 168 hours. Coagulation Profile: No results for input(s): INR, PROTIME in the last 168 hours. Cardiac Enzymes: No results for input(s): CKTOTAL, CKMB, CKMBINDEX, TROPONINI in the last 168 hours. BNP (last 3 results) Recent Labs    12/11/19 1549  PROBNP 4,030*   HbA1C: No results for input(s): HGBA1C in the last 72 hours. CBG: Recent Labs  Lab 12/30/19 0754 12/30/19 1136 12/30/19 1604 12/31/19 0801 12/31/19 1148  GLUCAP 401* 168* 252* 218* 236*   Lipid Profile: No results for input(s): CHOL, HDL, LDLCALC, TRIG, CHOLHDL, LDLDIRECT in the last 72  hours. Thyroid Function Tests: No results for input(s): TSH, T4TOTAL, FREET4, T3FREE, THYROIDAB in the last 72 hours. Anemia Panel: No results for input(s): VITAMINB12, FOLATE, FERRITIN, TIBC, IRON, RETICCTPCT in the last 72 hours. Urine analysis:    Component Value Date/Time   COLORURINE YELLOW 12/27/2019 1412   APPEARANCEUR CLEAR 12/27/2019 1412   LABSPEC 1.011 12/27/2019 1412   PHURINE 5.0 12/27/2019 1412   GLUCOSEU NEGATIVE 12/27/2019 1412   HGBUR NEGATIVE 12/27/2019 1412   HGBUR negative 02/28/2008 0751   BILIRUBINUR NEGATIVE 12/27/2019 1412   KETONESUR NEGATIVE 12/27/2019 1412   PROTEINUR 100 (A) 12/27/2019 1412   UROBILINOGEN 0.2 07/10/2014 0039   NITRITE NEGATIVE 12/27/2019 1412   LEUKOCYTESUR NEGATIVE 12/27/2019 1412  Recent Results (from the past 240 hour(s))  SARS Coronavirus 2 by RT PCR (hospital order, performed in [facility] hospital lab) Nasopharyngeal Nasopharyngeal Swab     Status: None   Collection Time: 12/27/19 12:13 PM   Specimen: Nasopharyngeal Swab  Result Value Ref Range Status   SARS Coronavirus 2 NEGATIVE NEGATIVE Final    Comment: (NOTE) SARS-CoV-2 target nucleic acids are NOT DETECTED.  The SARS-CoV-2 RNA is generally detectable in upper and lower respiratory specimens during the acute phase of infection. The lowest concentration of SARS-CoV-2 viral copies this assay can detect is 250 copies / mL. A negative result does not preclude SARS-CoV-2 infection and should not be used as the sole basis for treatment or other patient management decisions.  A negative result may occur with improper specimen collection / handling, submission of specimen other than nasopharyngeal swab, presence of viral mutation(s) within the areas targeted by this assay, and inadequate number of viral copies (<250 copies / mL). A negative result must be combined with clinical observations, patient history, and epidemiological information.  Fact Sheet for Patients:    StrictlyIdeas.no  Fact Sheet for Healthcare Providers: BankingDealers.co.za  This test is not yet approved or  cleared by the [place] FDA and has been authorized for detection and/or diagnosis of SARS-CoV-2 by FDA under an Emergency Use Authorization (EUA).  This EUA will remain in effect (meaning this test can be used) for the duration of the COVID-19 declaration under Section 564(b)(1) of the Act, 21 U.S.C. section 360bbb-3(b)(1), unless the authorization is terminated or revoked sooner.  Performed at [facility], [place] [address] [place], [place] [address]   MRSA PCR Screening     Status: None   Collection Time: 12/27/19  6:06 PM   Specimen: Nasal Mucosa; Nasopharyngeal  Result Value Ref Range Status   MRSA by PCR NEGATIVE NEGATIVE Final    Comment:        The GeneXpert MRSA Assay (FDA approved for NASAL specimens only), is one component of a comprehensive MRSA colonization surveillance program. It is not intended to diagnose MRSA infection nor to guide or monitor treatment for MRSA infections. Performed at [facility], [place] [address], [place], [place] [address]       Radiology Studies: No results found.  Scheduled Meds: . aspirin EC  81 mg Oral Daily  . feeding supplement (ENSURE ENLIVE)  237 mL Oral BID BM  . insulin aspart  0-4 Units Subcutaneous TID WC  . isosorbide mononitrate  60 mg Oral Daily  . latanoprost  1 drop Both Eyes QHS  . OLANZapine zydis  5 mg Oral QHS  . sodium chloride flush  3 mL Intravenous Q12H  . tamsulosin  0.4 mg Oral Daily   Continuous Infusions: . sodium chloride       LOS: 4 days   Time spent: 35 minutes.  Patrecia Pour, MD [facility] [email or web address] 12/31/2019, 1:23 PM

## 2019-12-31 NOTE — Progress Notes (Signed)
Notified that patient was having air hunger and distress. Discussed w/Night RN and evaluated him while I was on the unit. He is wheezing, tachypneic and easily agitated. His grandson is at bedside. Goals are comfort care and awaiting bed at [facility].  Will schedule pain control -pain may be driving his agitation. Started Hydromorphone .25 q4 hours. Also scheduled Ativan q4 for his agitation.   Discontinued CBG monitoring to reduce agitation. His PO intake has decreased will not continue to monitor at this point.  Lane Hacker, DO Palliative Medicine

## 2020-01-01 ENCOUNTER — Ambulatory Visit: Payer: Medicare Other | Admitting: Sports Medicine

## 2020-01-01 DIAGNOSIS — Z515 Encounter for palliative care: Secondary | ICD-10-CM

## 2020-01-01 MED ORDER — HYDROMORPHONE HCL 1 MG/ML IJ SOLN: 0.2500 mg | INTRAMUSCULAR | 0 refills | Status: AC

## 2020-01-01 MED ORDER — GLYCOPYRROLATE 0.2 MG/ML IJ SOLN
0.1000 mg | INTRAMUSCULAR | Status: DC
  Administered 2020-01-01: 0.1 mg via INTRAVENOUS
  Filled 2020-01-01: qty 1

## 2020-01-01 MED ORDER — SCOPOLAMINE 1 MG/3DAYS TD PT72: 1.0000 | MEDICATED_PATCH | TRANSDERMAL | Status: DC

## 2020-01-01 MED ORDER — GLYCOPYRROLATE 0.2 MG/ML IJ SOLN: 0.1000 mg | INTRAMUSCULAR | Status: AC

## 2020-01-01 NOTE — Progress Notes (Signed)
RT was able to place oral suction device in back of PT mouth and retrieved copious, thin, tan secretions- PT tolerated fairly well.

## 2020-01-01 NOTE — Progress Notes (Signed)
Manufacturing engineer [facility]) Hospital Liaison note.   Consents are complete. Please arrange transport to [facility]. RN please call report to [phone].   Thank you,     Farrel Gordon, RN, CCM       [place] (listed on [place] under [facility])     [phone]

## 2020-01-01 NOTE — Progress Notes (Signed)
Notified daughter Neoma Laming that patient was being picked up by [facility] and being transported to [facility].

## 2020-01-01 NOTE — TOC Transition Note (Signed)
Transition of Care [facility]) - CM/SW Discharge Note   Patient Details  Name: TRYTON BODI MRN: [id number] Date of Birth: [birth year]  Transition of Care [facility] CM/SW Contact:  Ross Ludwig, LCSW Phone Number: 01/01/2020, 11:15 AM   Clinical Narrative:     Patient to be d/c'ed today to [facility].  Patient and family agreeable to plans will transport via ems RN to call report to [phone].  CSW updated patient's daughter Neoma Laming and made her aware that patient will be discharging today.  Patient's daughter would like to be notified once EMS has arrived that patient has been picked up.  Please call or text her at [phone].       Final next level of care: [place] Barriers to Discharge: No Barriers Identified   Patient Goals and CMS Choice Patient states their goals for this hospitalization and ongoing recovery are:: Family would like patient to go to [facility] for end of life care. CMS Medicare.gov Compare Post Acute Care list provided to:: Patient Represenative (must comment) Choice offered to / list presented to : Adult Children  Discharge Placement              Patient chooses bed at: Other - please specify in the comment section below: ([place]) Patient to be transferred to facility by: [facility] Name of family member notified: Daughter [phone] Patient and family notified of of transfer: 01/01/20  Discharge Plan and Services                  DME Agency: NA                  Social Determinants of Health ([place]) Interventions     Readmission Risk Interventions No flowsheet data found.

## 2020-01-01 NOTE — Discharge Summary (Addendum)
Physician Discharge Summary  Leonard [name] WNI:[id number] DOB: March 28, 1921 DOA: 12/27/2019  PCP: Hoyt Koch, MD  Admit date: 12/27/2019 Discharge date: 01/01/2020  Admitted From: ALF Disposition: Residential Hospice   Recommendations for Outpatient Follow-up:  Comfort measures  PLEASE KEEP IV IN PLACE IF AT ALL POSSIBLE TO [place]: N/A Equipment/Devices: N/A Discharge Condition: Stable for transfer to hospice CODE STATUS: DNR Diet recommendation: As tolerated  Brief/Interim Summary: Leonard [name] is a 86 y.o. male with a history of T2DM, HTN, stage IIIa CKD, covid-19, CAD s/p CABG 1984 and redo 1997, ICM w/EF 40-45%, and bradycardia/conduction disease who presented from ALF (recently transferred from SNF) with shortness of breath, weight gain and found to be hypoxemic. He had lower extremity edema, CXR showed pulmonary edema, and BNP 1367. He was given IV lasix, and admitted for decompensated CHF with cardiology consulted. Despite improvement in volume status, the patient's delirium grew severe and he continued to grow more malnourished, taking nothing by mouth. Palliative care was consulted and after discussions with the patient's family, we have converted to comfort measures only, pursuing residential hospice placement.  Discharge Diagnoses:  Active Problems:   Acute on chronic systolic CHF (congestive heart failure) ([place])   Hospice care patient  Acute on chronic HFrEF: LVEF 40-45%. Has diuresed to euvolemia.  - Will not restart diuretic since po intake has been so negligible. - Stopped BB as he's had bradycardia in the past and currently.  - Holding RAAS agents with renal impairment and need for diuresis.  - Increased imdur for combined preload/afterload reduction. All medications will be stopped due to goals of care in the absence of angina.  HTN, history of sinus bradycardia:  - Avoid negative chronotropes.   Wheezing: Upper  airway. Suctioning with improvement, consider robinul as well. - Dysphagia diet per SLP if he desires.  Leukocytosis: Resolved without antimicrobial Tx.    T2DM: HbA1c 9.3%. Has had consistent issues with hypoglycemia, permissive hyperglycemia. Recommend avoid CBGs and insulin administration.  History of UTI: Urinalysis here shows no pyuria or bacteriuria.   Acute delirium on chronic, progressive cognitive impairment, NOS: No definite diagnosis of dementia, though was reportedly under consideration for transfer to memory care.  - Tx as per palliative care, appreciate their assistance. Focus now is exclusively on alleviating distress.   Thrombocytopenia: Mild, appears chronic, stable.  AKI on stage IV CKD (based on available lab data)  - Dilaudid preferred to morphine  Discharge Instructions  Allergies as of 01/01/2020      Reactions   Beta Adrenergic Blockers    History of AV block and bradycardia -> no true allergy, just need to avoid   Codeine Nausea And Vomiting, Other (See Comments)   hallucinations   Microzide [hydrochlorothiazide] Other (See Comments)   Acute gout on LosartanHCT 12/2014   Ace Inhibitors Other (See Comments)    cough      Medication List    STOP taking these medications   aspirin 81 MG tablet   B-D INSULIN SYRINGE 29G X 1/2" 1 ML Misc Generic drug: INSULIN SYRINGE 1CC/29G   Basaglar KwikPen 100 UNIT/ML   ciprofloxacin 250 MG tablet Commonly known as: CIPRO   cyanocobalamin 1000 MCG tablet   Ensure   feeding supplement (GLUCERNA SHAKE) Liqd   furosemide 20 MG tablet Commonly known as: LASIX   gabapentin 600 MG tablet Commonly known as: NEURONTIN   insulin lispro 100 UNIT/ML injection Commonly known as: HUMALOG   Insulin Pen  Needle 32G X 4 MM Misc   isosorbide mononitrate 30 MG 24 hr tablet Commonly known as: IMDUR   losartan 25 MG tablet Commonly known as: COZAAR   potassium chloride SA 20 MEQ tablet Commonly known as:  KLOR-CON   Prodigy Lancets 28G Misc   tamsulosin 0.4 MG Caps capsule Commonly known as: FLOMAX     TAKE these medications   diclofenac sodium 1 % Gel Commonly known as: VOLTAREN Apply 2 g topically 2 (two) times daily as needed.   glycopyrrolate 0.2 MG/ML injection Commonly known as: ROBINUL Inject 0.5 mLs (0.1 mg total) into the vein every 4 (four) hours.   HYDROmorphone 1 MG/ML injection Commonly known as: DILAUDID Inject 0.25 mLs (0.25 mg total) into the vein every 4 (four) hours.   latanoprost 0.005 % ophthalmic solution Commonly known as: XALATAN Place 1 drop into both eyes at bedtime.       Follow-up Information    Hoyt Koch, MD Follow up.   Specialty: Internal Medicine Why: FYI for residential hospice transfer. No Follow up needed. Contact information: [place] [address] [phone]              Allergies  Allergen Reactions  . Beta Adrenergic Blockers     History of AV block and bradycardia -> no true allergy, just need to avoid  . Codeine Nausea And Vomiting and Other (See Comments)    hallucinations  . Microzide [Hydrochlorothiazide] Other (See Comments)    Acute gout on LosartanHCT 12/2014  . Ace Inhibitors Other (See Comments)     cough    Consultations:  Cardiology  Palliative care  Procedures/Studies: DG Chest 2 View  Result Date: 12/27/2019 CLINICAL DATA:  Shortness of breath since last night, decreased oxygen saturation, increased work of breathing EXAM: CHEST - 2 VIEW COMPARISON:  11/12/2019 FINDINGS: Enlargement of cardiac silhouette post CABG. Pulmonary vascular congestion. Hazy BILATERAL pulmonary infiltrates slightly greater on RIGHT, question pulmonary edema though infection not completely excluded. No pleural effusion or pneumothorax. Osseous structures unremarkable. IMPRESSION: Enlargement of cardiac silhouette with pulmonary vascular congestion and BILATERAL pulmonary infiltrates favor pulmonary  edema. Electronically Signed   By: Lavonia Dana M.D.   On: 12/27/2019 12:44   DG CHEST PORT 1 VIEW  Result Date: 12/29/2019 CLINICAL DATA:  Acute on chronic systolic and diastolic congestive heart failure. EXAM: PORTABLE CHEST 1 VIEW COMPARISON:  12/27/2019 FINDINGS: Status post median sternotomy and CABG procedure. Mild cardiac enlargement, stable. Interval improvement in pulmonary edema. No new findings. IMPRESSION: Improving pulmonary edema. Electronically Signed   By: Kerby Moors M.D.   On: 12/29/2019 10:40    Subjective: Calm, no agitation. No complaints.  Discharge Exam: Vitals:   12/30/19 0914 12/30/19 2314  BP: (!) 183/78 (!) 134/97  Pulse: 88 59  Resp: 20 16  Temp: 99 F (37.2 C) 97.7 F (36.5 C)  SpO2: 100% 95%   General: Calm, no distress Respiratory: Nonlabored, upper airway sounds improved with suction.   Labs: BNP (last 3 results) Recent Labs    11/12/19 1630 11/14/19 1032 12/27/19 1140  BNP 447.9* 311.7* 9,147.8*   Basic Metabolic Panel: Recent Labs  Lab 12/27/19 1140 12/28/19 0419 12/28/19 0433 12/29/19 0503 12/30/19 0506  NA 137 136  --  140 138  K 5.1 4.7  --  3.6 3.6  CL 102 101  --  102 99  CO2 22 23  --  25 25  GLUCOSE 226* 232*  --  104* 389*  BUN 30* 40*  --  40* 49*  CREATININE 1.57* 1.66*  --  1.71* 1.84*  CALCIUM 8.4* 8.2*  --  7.9* 7.5*  MG  --   --  2.4  --   --    Liver Function Tests: Recent Labs  Lab 12/27/19 1140 12/28/19 0419  AST 80* 30  ALT 57* 45*  ALKPHOS 140* 124  BILITOT 0.7 0.5  PROT 6.7 6.5  ALBUMIN 3.8 3.5   No results for input(s): LIPASE, AMYLASE in the last 168 hours. No results for input(s): AMMONIA in the last 168 hours. CBC: Recent Labs  Lab 12/27/19 1140 12/29/19 0503 12/30/19 0934  WBC 4.8 12.6* 7.7  NEUTROABS 2.0  --   --   HGB 13.1 12.7* 13.0  HCT 40.3 38.4* 39.5  MCV 88.8 87.3 86.4  PLT 126* 128* 116*   Cardiac Enzymes: No results for input(s): CKTOTAL, CKMB, CKMBINDEX, TROPONINI in  the last 168 hours. BNP: Invalid input(s): POCBNP CBG: Recent Labs  Lab 12/30/19 1136 12/30/19 1604 12/31/19 0801 12/31/19 1148 12/31/19 1708  GLUCAP 168* 252* 218* 236* 256*   D-Dimer No results for input(s): DDIMER in the last 72 hours. Hgb A1c No results for input(s): HGBA1C in the last 72 hours. Lipid Profile No results for input(s): CHOL, HDL, LDLCALC, TRIG, CHOLHDL, LDLDIRECT in the last 72 hours. Thyroid function studies No results for input(s): TSH, T4TOTAL, T3FREE, THYROIDAB in the last 72 hours.  Invalid input(s): FREET3 Anemia work up No results for input(s): VITAMINB12, FOLATE, FERRITIN, TIBC, IRON, RETICCTPCT in the last 72 hours. Urinalysis    Component Value Date/Time   COLORURINE YELLOW 12/27/2019 1412   APPEARANCEUR CLEAR 12/27/2019 1412   LABSPEC 1.011 12/27/2019 1412   PHURINE 5.0 12/27/2019 1412   GLUCOSEU NEGATIVE 12/27/2019 1412   HGBUR NEGATIVE 12/27/2019 1412   HGBUR negative 02/28/2008 0751   BILIRUBINUR NEGATIVE 12/27/2019 1412   KETONESUR NEGATIVE 12/27/2019 1412   PROTEINUR 100 (A) 12/27/2019 1412   UROBILINOGEN 0.2 07/10/2014 0039   NITRITE NEGATIVE 12/27/2019 1412   LEUKOCYTESUR NEGATIVE 12/27/2019 1412    Microbiology Recent Results (from the past 240 hour(s))  SARS Coronavirus 2 by RT PCR (hospital order, performed in [facility] hospital lab) Nasopharyngeal Nasopharyngeal Swab     Status: None   Collection Time: 12/27/19 12:13 PM   Specimen: Nasopharyngeal Swab  Result Value Ref Range Status   SARS Coronavirus 2 NEGATIVE NEGATIVE Final    Comment: (NOTE) SARS-CoV-2 target nucleic acids are NOT DETECTED.  The SARS-CoV-2 RNA is generally detectable in upper and lower respiratory specimens during the acute phase of infection. The lowest concentration of SARS-CoV-2 viral copies this assay can detect is 250 copies / mL. A negative result does not preclude SARS-CoV-2 infection and should not be used as the sole basis for treatment or  other patient management decisions.  A negative result may occur with improper specimen collection / handling, submission of specimen other than nasopharyngeal swab, presence of viral mutation(s) within the areas targeted by this assay, and inadequate number of viral copies (<250 copies / mL). A negative result must be combined with clinical observations, patient history, and epidemiological information.  Fact Sheet for Patients:   StrictlyIdeas.no  Fact Sheet for Healthcare Providers: BankingDealers.co.za  This test is not yet approved or  cleared by the [place] FDA and has been authorized for detection and/or diagnosis of SARS-CoV-2 by FDA under an Emergency Use Authorization (EUA).  This EUA will remain in effect (meaning this  test can be used) for the duration of the COVID-19 declaration under Section 564(b)(1) of the Act, 21 U.S.C. section 360bbb-3(b)(1), unless the authorization is terminated or revoked sooner.  Performed at [facility], [place] [address] [place], [place] [address]   MRSA PCR Screening     Status: None   Collection Time: 12/27/19  6:06 PM   Specimen: Nasal Mucosa; Nasopharyngeal  Result Value Ref Range Status   MRSA by PCR NEGATIVE NEGATIVE Final    Comment:        The GeneXpert MRSA Assay (FDA approved for NASAL specimens only), is one component of a comprehensive MRSA colonization surveillance program. It is not intended to diagnose MRSA infection nor to guide or monitor treatment for MRSA infections. Performed at [facility], [place] [address] [place], [place] [address]     Time coordinating discharge: Approximately 40 minutes  Patrecia Pour, MD  [facility] 01/01/2020, 10:41 AM

## 2020-01-01 NOTE — Progress Notes (Signed)
Report called to [place] at [facility].  Awaiting [facility] for transportation.

## 2020-01-01 NOTE — Progress Notes (Signed)
Daily Progress Note   Patient Name: Leonard [name]       Date: 01/01/2020 DOB: 10-26-1920  Age: 86 y.o. MRN#: [id number] Attending Physician: Patrecia Pour, MD Primary Care Physician: Hoyt Koch, MD Admit Date: 12/27/2019  Reason for Consultation/Follow-up: Establishing goals of care  Subjective: Chart reviewed.  Discussed with bedside RN as well as Dr. Bonner Puna. Mr. Pilger is now full comfort measures with plans for transition to residential hospice. On exam today, he has significant upper airway secretions noted.  RN preparing to give dose of Robinul to see if this improves.  He had copious secretions suctioned earlier today. He does not appear to be in any distress and is resting comfortably.    Length of Stay: 5  Current Medications: Scheduled Meds:  . feeding supplement (ENSURE ENLIVE)  237 mL Oral BID BM  . glycopyrrolate  0.1 mg Intravenous Q4H  .  HYDROmorphone (DILAUDID) injection  0.25 mg Intravenous Q4H  . latanoprost  1 drop Both Eyes QHS  . LORazepam  1 mg Intravenous Q4H  . OLANZapine zydis  5 mg Oral QHS  . sodium chloride flush  3 mL Intravenous Q12H    Continuous Infusions: . sodium chloride      PRN Meds: sodium chloride, acetaminophen, albuterol, HYDROmorphone (DILAUDID) injection **OR** ondansetron (ZOFRAN) IV, sodium chloride flush  Physical Exam       In bed, somnolent, no distress but upper airway secretions noted Regular rate Coarse breath sounds with significant secretions No distention of abdomen No edema  Vital Signs: BP (!) 134/97 (BP Location: Right Arm)   Pulse 59   Temp 97.7 F (36.5 C) (Oral)   Resp 16   Ht 5\' 4"  (1.626 m)   Wt 60.5 kg   SpO2 95%   BMI 22.89 kg/m  SpO2: SpO2: 95 % O2 Device: O2 Device: Nasal Cannula O2 Flow  Rate: O2 Flow Rate (L/min): 2 L/min  Intake/output summary:   Intake/Output Summary (Last 24 hours) at 01/01/2020 1127 Last data filed at 01/01/2020 0600 Gross per 24 hour  Intake 0 ml  Output 975 ml  Net -975 ml   LBM: Last BM Date: 12/31/19 Baseline Weight: Weight: 65.8 kg Most recent weight: Weight: 60.5 kg       Palliative Assessment/Data:      Patient Active  Problem List   Diagnosis Date Noted  . Hospice care patient 01/01/2020  . Acute on chronic systolic CHF (congestive heart failure) ([place]) 12/27/2019  . Malnutrition of moderate degree 11/15/2019  . Complete heart block ([place]) 11/13/2019  . Acute encephalopathy 11/13/2019  . Acute CHF (congestive heart failure) ([place]) 11/12/2019  . CHF (congestive heart failure) ([place]) 11/12/2019  . Moderate nonproliferative diabetic retinopathy of both eyes ([place]) 09/26/2019  . Pseudophakia 09/26/2019  . Posterior vitreous detachment of both eyes 09/26/2019  . 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 COVID-19 virus detected 06/26/2019  . Thrombocytopenia ([place]) 03/12/2017  . Mass of skin of right shoulder 03/12/2017  . Normal pressure primary open angle glaucoma (POAG) 03/11/2017  . Hiatal hernia 03/11/2017  . Chest pain 03/11/2017  . Left leg weakness 06/29/2016  . Degenerative arthritis of left knee 05/25/2016  . Left knee pain 03/24/2016  . History of prostate cancer 06/12/2014  . Chronic renal insufficiency, stage III (moderate) 06/12/2014  . Non-compliant behavior 10/18/2013  . Abnormal computed tomography of pancreas or bile duct 09/21/2011  . Low back pain with sciatica 07/07/2010  . Uncontrolled type 2 diabetes with renal manifestation ([place]) 03/06/2008  . Hyperlipidemia 03/06/2008  . Hx of completed stroke 03/06/2008  . Diabetes ([place]) 07/26/2007  . Essential hypertension 07/26/2007  . CAROTID BRUIT 07/26/2007  . Coronary atherosclerosis 07/04/2006  . OSTEOPOROSIS 07/04/2006     Palliative Care Assessment & Plan   Patient Profile:  SHION BLUESTEIN a 86 y.o.malewith a hx of CAD s/pCABG 6811&5726, CKD stage III (baseline [place] 1.3-1.6),carotidartery disease, DMwith peripheral neuropathy, HTN, HLD, glaucoma, prostate CA s/p XRT,prior snycope felt due todehydration/bradycardia,thrombocytopenia, recent underlying conduction disease, chronic systolic CHF,COVID PNA 20/3559, mild dilation of ascending aorta admitted for evaluation of CHF.  Assessment: Acute delirium acute hypoxic respiratory failure due to acute on chronic systolic CHF. Also with labile HTN, brittle Dm,IV CKD. Ongoing bradycardia history of conduction disease. PPS 10% PPS 10% Recommendations/Plan: -Currently appears symptomatically controlled other than excessive upper airway secretions.  Agree with plan for Robinul. -Plan for transition to residential hospice today.  At time of my examination, he does appear to be stable for transport.    Goals of Care and Additional Recommendations:  Limitations on Scope of Treatment: Full Comfort Care  Code Status:    Code Status Orders  (From admission, onward)         Start     Ordered   12/27/19 1406  Do not attempt resuscitation (DNR)  Continuous       Question Answer Comment  In the event of cardiac or respiratory ARREST Do not call a "code blue"   In the event of cardiac or respiratory ARREST Do not perform Intubation, CPR, defibrillation or ACLS   In the event of cardiac or respiratory ARREST Use medication by any route, position, wound care, and other measures to relive pain and suffering. May use oxygen, suction and manual treatment of airway obstruction as needed for comfort.      12/27/19 1405        Code Status History    Date Active Date Inactive Code Status Order ID Comments User Context   12/27/2019 1402 12/27/2019 1405 Full Code [id number]  Max Sane, MD ED   11/13/2019 0033 11/20/2019 1829 DNR [id number]  Rise Patience, MD ED   11/12/2019 2305 11/13/2019 0032 DNR [id number]  Rise Patience, MD ED   06/26/2019 1209 06/30/2019 1832 DNR [id number]  Lorin Mercy,  Anderson Malta, MD ED   03/11/2017 1449 03/13/2017 1614 Full Code [id number]  Elease Hashimoto ED   Advance Care Planning Activity    Advance Directive Documentation     Most Recent Value  Type of Advance Directive Healthcare Power of Attorney, Living will  Pre-existing out of facility DNR order (yellow form or pink MOST form) --  "MOST" Form in Place? --       Prognosis:   Hours - Days  Discharge Planning:  Residential hospice.   Care plan was discussed with Dr. Bonner Puna, LCSW, and bedside RN  Thank you for allowing the Palliative Medicine Team to assist in the care of this patient.   Time In: 1050 Time Out: 1110 Total Time 20 Prolonged Time Billed No    Greater than 50%  of this time was spent counseling and coordinating care related to the above assessment and plan.  Micheline Rough, MD  Please contact Palliative Medicine Team phone at [phone] for questions and concerns.

## 2020-01-06 DEATH — deceased

## 2020-03-12 ENCOUNTER — Ambulatory Visit: Payer: Medicare Other | Admitting: Nurse Practitioner

## 2020-09-25 ENCOUNTER — Encounter (INDEPENDENT_AMBULATORY_CARE_PROVIDER_SITE_OTHER): Payer: Medicare Other | Admitting: Ophthalmology

## 2021-04-09 IMAGING — DX DG CHEST 1V PORT
1 series · 1 of 1 positions shown · non-contrast
Comparison: 05/22/2017.

CLINICAL DATA: COVID.  History of fever and fatigue.

EXAM:
PORTABLE CHEST 1 VIEW

[chest ap]
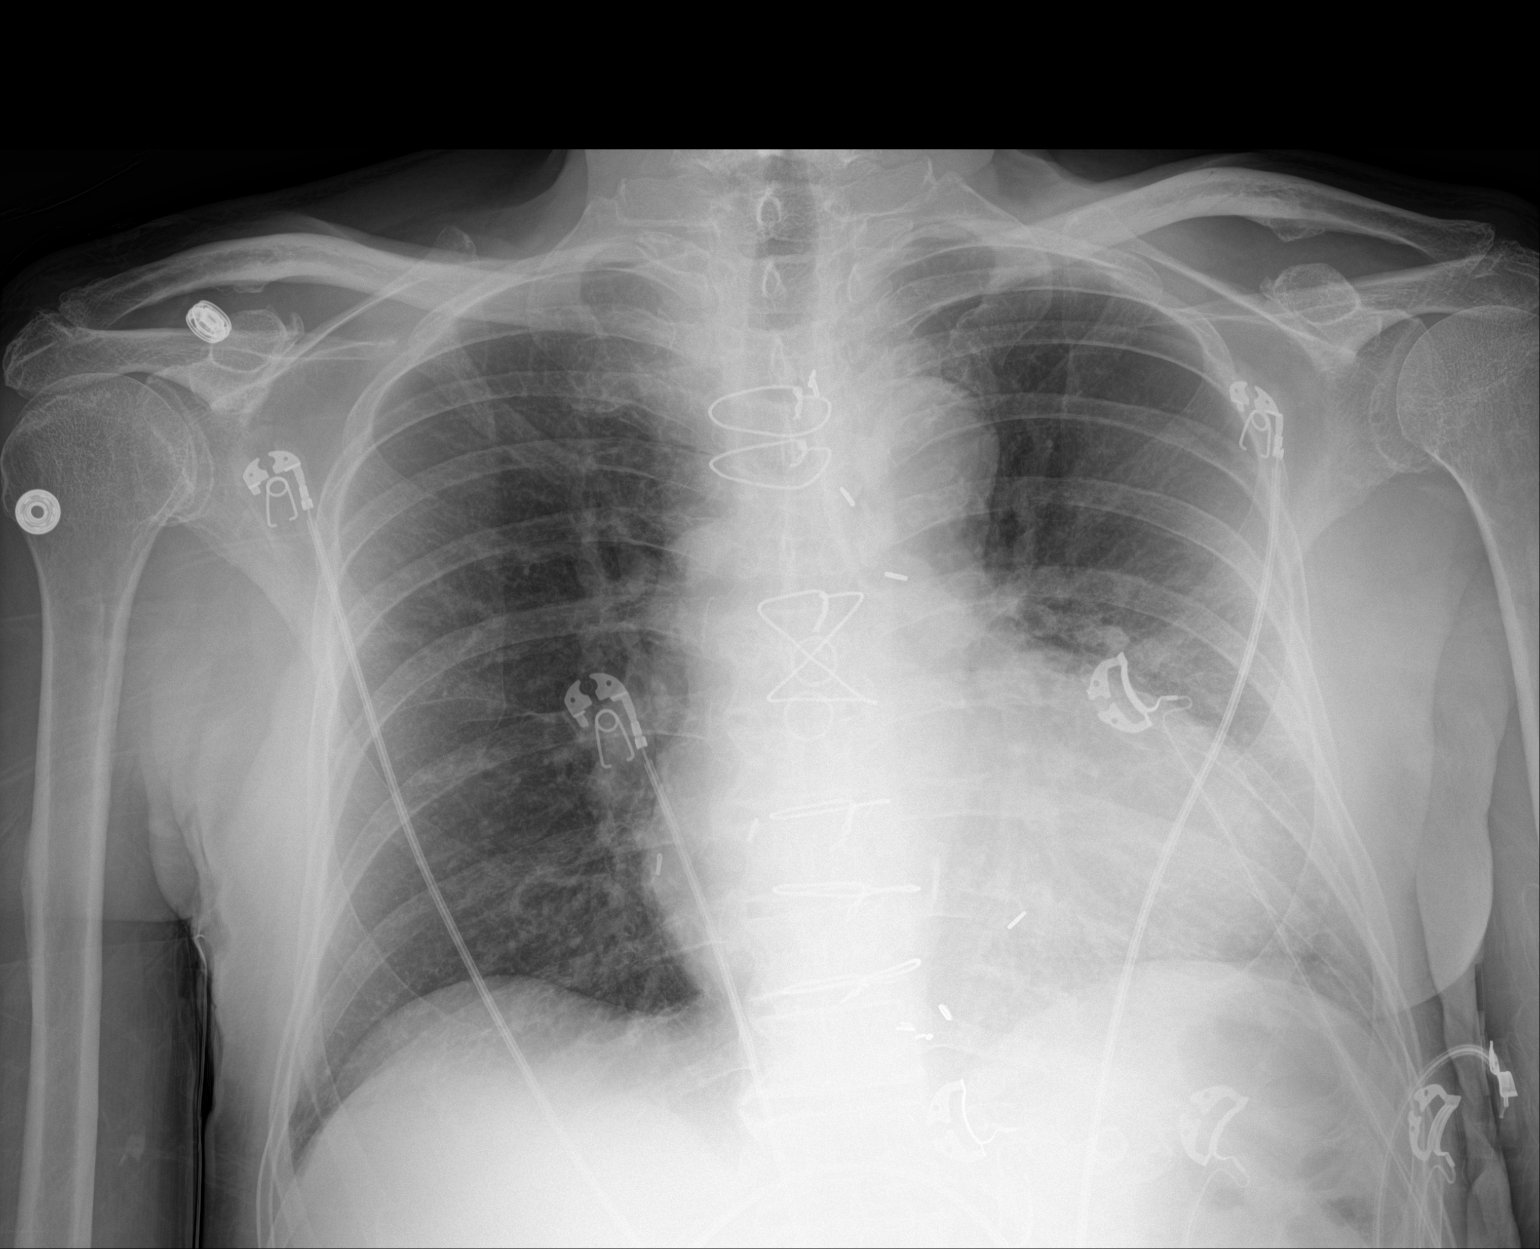

[1 of 1 positions shown; findings below may reference images not displayed]

FINDINGS: Prior CABG. Cardiomegaly. Very mild pulmonary venous congestion
cannot be excluded. Very mild bilateral interstitial prominence
noted. Mild CHF and or pneumonitis cannot be excluded. No pleural
effusion or pneumothorax. No acute bony abnormality identified.
IMPRESSION: Prior CABG. Cardiomegaly. Very mild pulmonary venous congestion.
Very mild bilateral interstitial prominence noted. Mild CHF and or
pneumonitis cannot be excluded in this known COVID positive
patient.

## 2021-08-26 IMAGING — CT CT ANGIO CHEST
2 of 6 series · 18 of 36 positions shown · IV contrast (omnipaque)
Comparison: Chest x-ray from same day. CT chest dated April 28, 2011.

CLINICAL DATA: Progressively worsening shortness of breath over the
past 3 days.

EXAM:
CT ANGIOGRAPHY CHEST WITH CONTRAST
TECHNIQUE: Multidetector CT imaging of the chest was performed using the
standard protocol during bolus administration of intravenous
contrast. Multiplanar CT image reconstructions and MIPs were
obtained to evaluate the vascular anatomy.
CONTRAST:  60mL OMNIPAQUE IOHEXOL 350 MG/ML SOLN

[Series 9: pe thins · axial · 0.69mm/px · z∈[-217,-10]mm · 17 of 331 slices shown]
[im 17/331  lung]
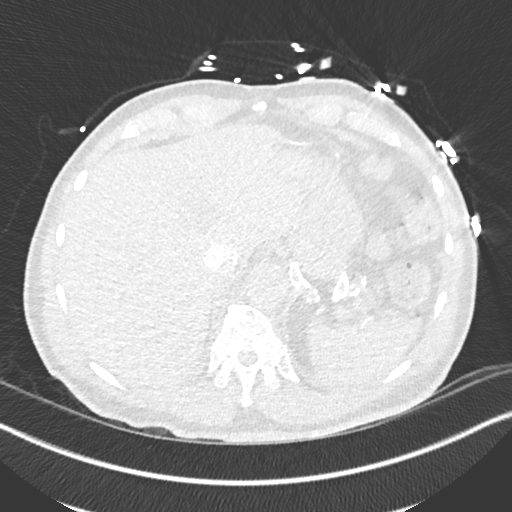
[im 34/331  mediastinal]
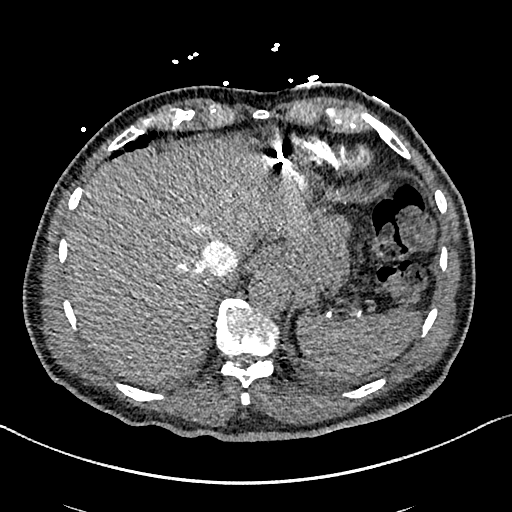
[im 50/331  lung]
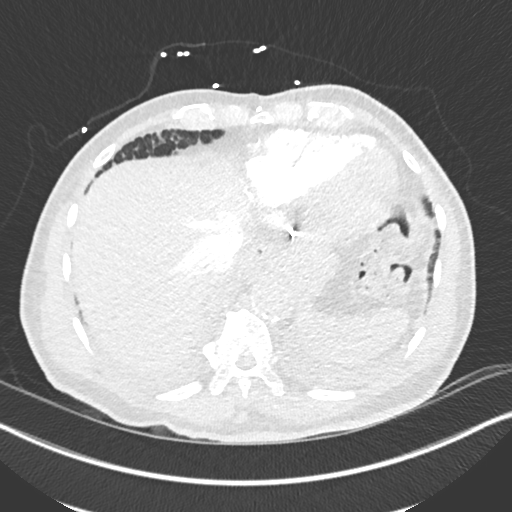
[im 67/331  mediastinal]
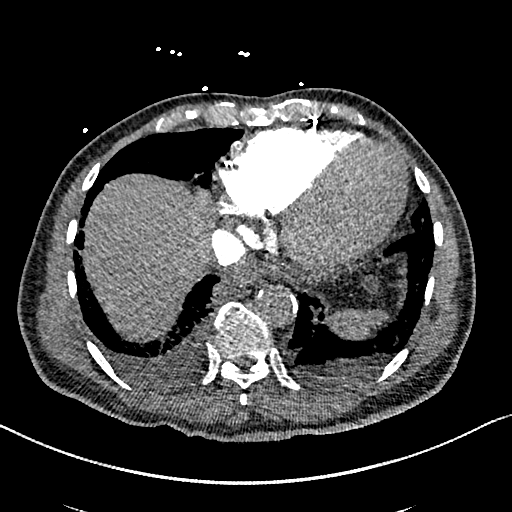
[im 100/331  lung]
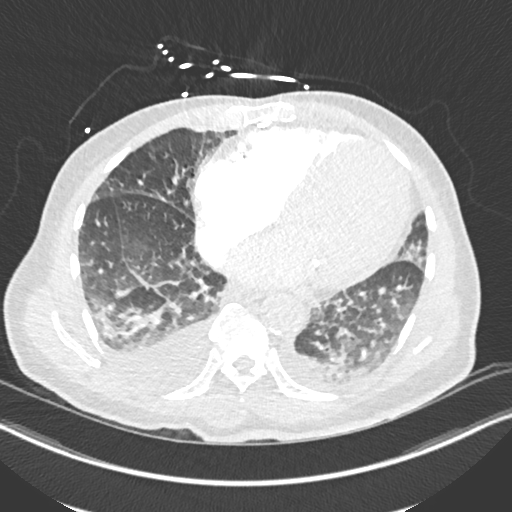
[im 116/331  mediastinal]
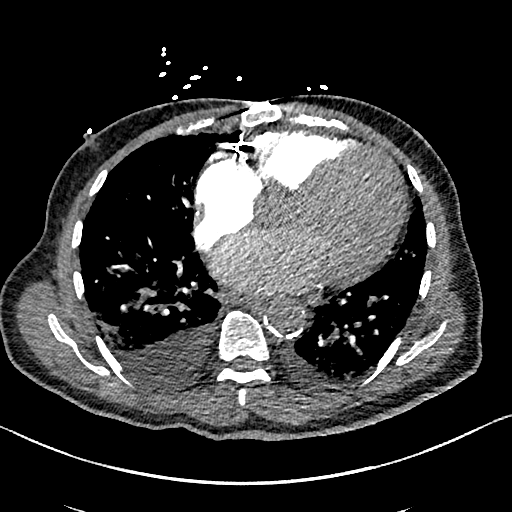
[im 133/331  lung]
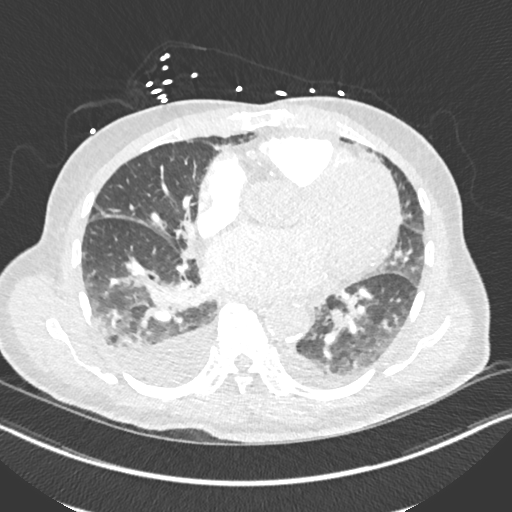
[im 149/331  mediastinal]
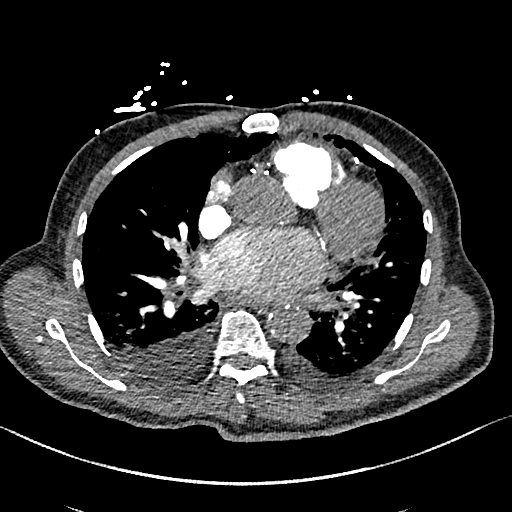
[im 166/331  lung]
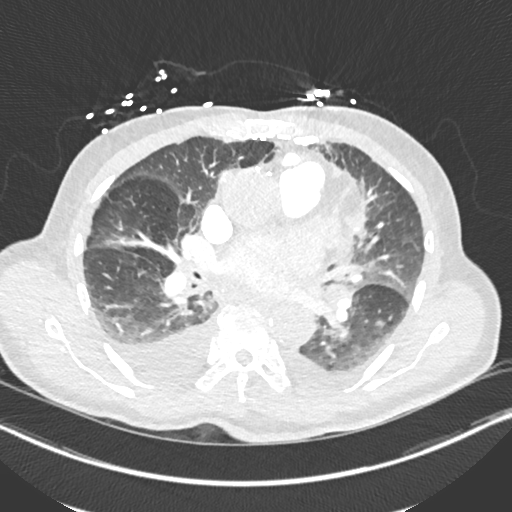
[im 182/331  mediastinal]
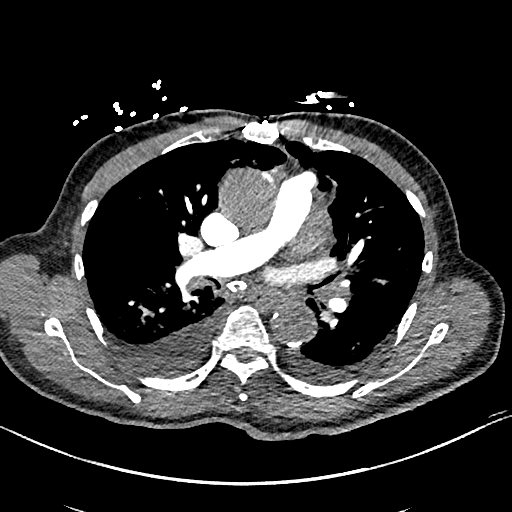
[im 199/331  lung]
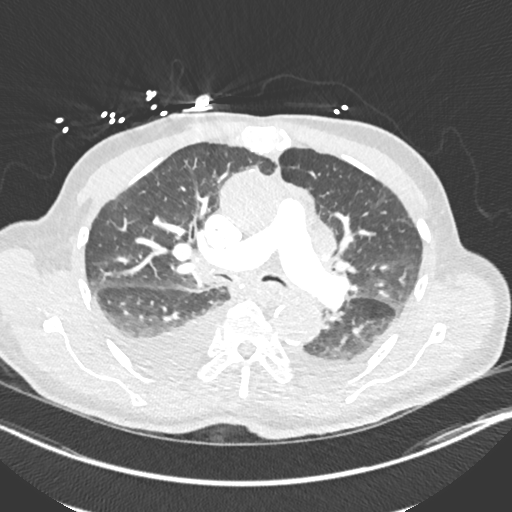
[im 215/331  mediastinal]
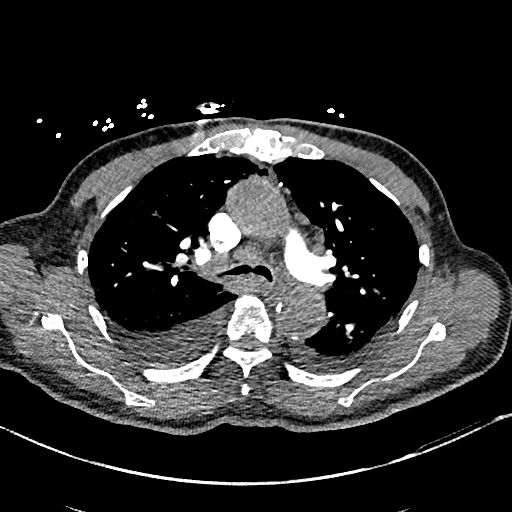
[im 232/331  lung]
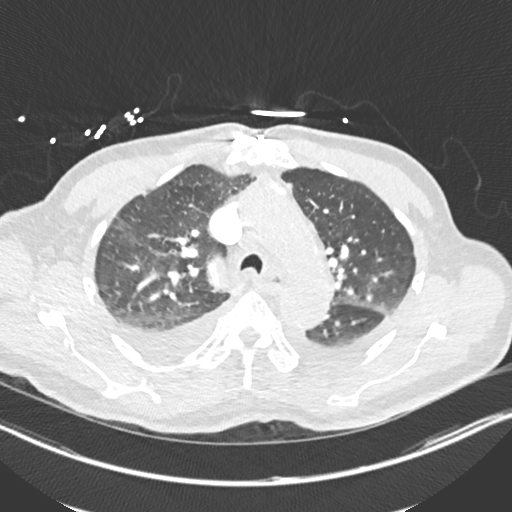
[im 265/331  mediastinal]
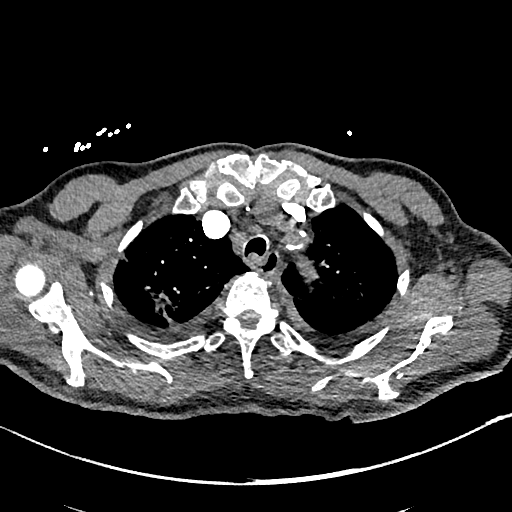
[im 281/331  lung]
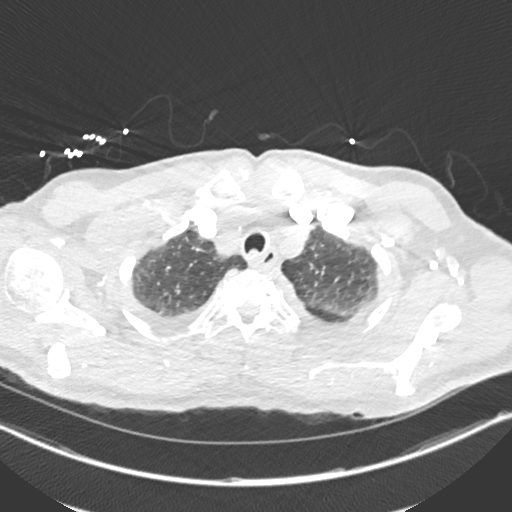
[im 298/331  mediastinal]
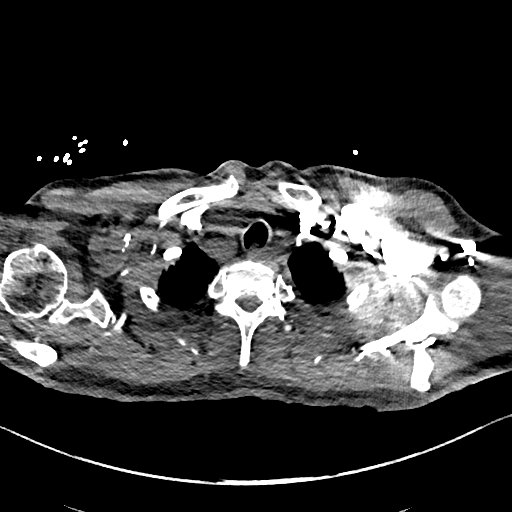
[im 314/331  lung]
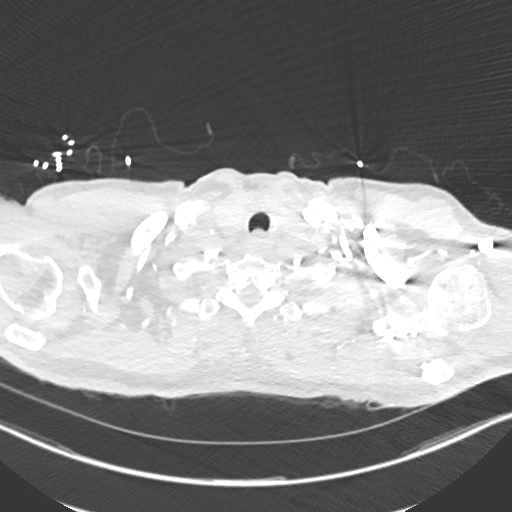

[Series 10: pe 2mm cor · coronal · 0.50mm/px · 1 of 151 slices shown]
[im 76/151  mediastinal]
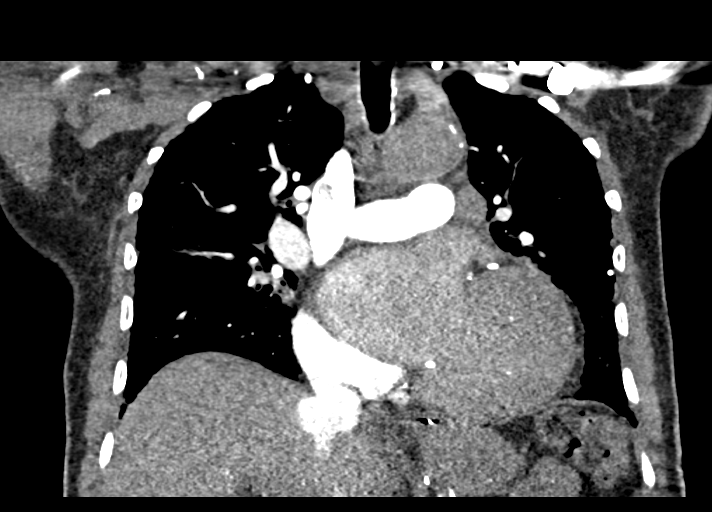

[18 of 36 positions shown; findings below may reference images not displayed]

FINDINGS: Cardiovascular: Satisfactory opacification of the pulmonary arteries
to the segmental level. No evidence of pulmonary embolism. Unchanged
cardiomegaly. No pericardial effusion. Prior CABG. New mild
aneurysmal dilatation of the ascending thoracic aorta, measuring up
to 4.0 cm. Coronary, aortic arch, and branch vessel atherosclerotic
vascular disease.

Mediastinum/Nodes: Mildly enlarged mediastinal and bilateral hilar
lymph nodes measuring up to 1.3 cm in short axis, likely reactive.
Unchanged calcified subcarinal lymph node. No enlarged axillary
lymph nodes. The thyroid gland, trachea, and esophagus demonstrate
no significant findings.

Lungs/Pleura: Small right greater than left pleural effusions. Mild
interlobular septal thickening at the lung bases. Severe central
peribronchial thickening. Linear scarring/atelectasis in the
posterior right upper lobe. Dependent subsegmental atelectasis in
the posterior right middle lobe and both lower lobes. Calcified
granuloma in the lingula. No pneumothorax.

Upper Abdomen: No acute abnormality. Reflux of contrast into the IVC
and hepatic veins.

Musculoskeletal: No chest wall abnormality. No acute or significant
osseous findings.

Review of the MIP images confirms the above findings.
IMPRESSION: 1. No evidence of pulmonary embolism.
2. Findings consistent with congestive heart failure with small
right greater than left pleural effusions and mild interstitial
pulmonary edema. Evidence of right heart dysfunction.
3. Severe central peribronchial thickening, consistent with
infectious or inflammatory bronchitis.
4. New ascending thoracic aortic aneurysm measuring up to 4.0 cm.
Consider annual imaging followup by CTA or MRA as clinically
indicated given the patient's age. This recommendation follows 6272
ACCF/AHA/AATS/ACR/ASA/SCA/[place]/NYA/FOE/HAGAR Guidelines for the
Diagnosis and Management of Patients with Thoracic Aortic Disease.
Circulation. 6272; 121: [id number]. Aortic aneurysm NOS (FFL1R-4NG.M)
5. Aortic Atherosclerosis (FFL1R-KLD.D).
